# Patient Record
Sex: Female | Born: 1945 | Race: White | Hispanic: No | Smoking: Former smoker
Health system: Southern US, Community
[De-identification: ages and names within clinical notes are randomized; demographics above are authoritative.]

## PROBLEM LIST (undated history)

## (undated) DIAGNOSIS — Z8679 Personal history of other diseases of the circulatory system: Secondary | ICD-10-CM

## (undated) DIAGNOSIS — L97929 Non-pressure chronic ulcer of unspecified part of left lower leg with unspecified severity: Secondary | ICD-10-CM

## (undated) DIAGNOSIS — M1990 Unspecified osteoarthritis, unspecified site: Secondary | ICD-10-CM

## (undated) DIAGNOSIS — I440 Atrioventricular block, first degree: Secondary | ICD-10-CM

## (undated) DIAGNOSIS — Z9889 Other specified postprocedural states: Secondary | ICD-10-CM

## (undated) DIAGNOSIS — I739 Peripheral vascular disease, unspecified: Secondary | ICD-10-CM

## (undated) DIAGNOSIS — R112 Nausea with vomiting, unspecified: Secondary | ICD-10-CM

## (undated) DIAGNOSIS — M069 Rheumatoid arthritis, unspecified: Secondary | ICD-10-CM

## (undated) DIAGNOSIS — I872 Venous insufficiency (chronic) (peripheral): Secondary | ICD-10-CM

## (undated) DIAGNOSIS — J439 Emphysema, unspecified: Secondary | ICD-10-CM

## (undated) DIAGNOSIS — D519 Vitamin B12 deficiency anemia, unspecified: Secondary | ICD-10-CM

## (undated) DIAGNOSIS — Z86718 Personal history of other venous thrombosis and embolism: Secondary | ICD-10-CM

## (undated) DIAGNOSIS — K219 Gastro-esophageal reflux disease without esophagitis: Secondary | ICD-10-CM

## (undated) DIAGNOSIS — Z89619 Acquired absence of unspecified leg above knee: Secondary | ICD-10-CM

## (undated) DIAGNOSIS — I480 Paroxysmal atrial fibrillation: Secondary | ICD-10-CM

## (undated) DIAGNOSIS — Z8673 Personal history of transient ischemic attack (TIA), and cerebral infarction without residual deficits: Secondary | ICD-10-CM

## (undated) HISTORY — DX: Peripheral vascular disease, unspecified: I73.9

## (undated) HISTORY — PX: CARDIAC CATHETERIZATION: SHX172

## (undated) HISTORY — PX: VASCULAR SURGERY: SHX849

## (undated) HISTORY — PX: CATARACT EXTRACTION W/ INTRAOCULAR LENS  IMPLANT, BILATERAL: SHX1307

## (undated) HISTORY — PX: CARDIOVERSION: SHX1299

## (undated) HISTORY — DX: Venous insufficiency (chronic) (peripheral): I87.2

## (undated) HISTORY — PX: TOTAL KNEE ARTHROPLASTY: SHX125

---

## 1994-02-08 HISTORY — PX: CHOLECYSTECTOMY: SHX55

## 1997-11-23 ENCOUNTER — Emergency Department (HOSPITAL_COMMUNITY): Admission: EM | Admit: 1997-11-23 | Discharge: 1997-11-23 | Payer: Self-pay | Admitting: Emergency Medicine

## 1997-11-23 ENCOUNTER — Encounter: Payer: Self-pay | Admitting: Emergency Medicine

## 2003-02-21 ENCOUNTER — Encounter: Admission: RE | Admit: 2003-02-21 | Discharge: 2003-02-21 | Payer: Self-pay | Admitting: Urology

## 2003-02-26 ENCOUNTER — Ambulatory Visit (HOSPITAL_BASED_OUTPATIENT_CLINIC_OR_DEPARTMENT_OTHER): Admission: RE | Admit: 2003-02-26 | Discharge: 2003-02-26 | Payer: Self-pay | Admitting: Urology

## 2003-02-26 ENCOUNTER — Ambulatory Visit (HOSPITAL_COMMUNITY): Admission: RE | Admit: 2003-02-26 | Discharge: 2003-02-26 | Payer: Self-pay | Admitting: Urology

## 2003-02-26 ENCOUNTER — Encounter (INDEPENDENT_AMBULATORY_CARE_PROVIDER_SITE_OTHER): Payer: Self-pay | Admitting: Specialist

## 2003-02-26 HISTORY — PX: OTHER SURGICAL HISTORY: SHX169

## 2007-02-09 HISTORY — PX: HEMATOMA EVACUATION: SHX5118

## 2007-05-10 HISTORY — PX: CARDIAC ELECTROPHYSIOLOGY MAPPING AND ABLATION: SHX1292

## 2009-02-08 HISTORY — PX: TRIGGER FINGER RELEASE: SHX641

## 2010-02-04 ENCOUNTER — Inpatient Hospital Stay (HOSPITAL_COMMUNITY)
Admission: RE | Admit: 2010-02-04 | Discharge: 2010-02-08 | Payer: Self-pay | Source: Home / Self Care | Attending: Orthopedic Surgery | Admitting: Orthopedic Surgery

## 2010-02-04 HISTORY — PX: REVISION TOTAL KNEE ARTHROPLASTY: SUR1280

## 2010-04-20 LAB — CBC
HCT: 37.7 % (ref 36.0–46.0)
MCH: 39.4 pg — ABNORMAL HIGH (ref 26.0–34.0)
MCH: 39.5 pg — ABNORMAL HIGH (ref 26.0–34.0)
MCH: 39.9 pg — ABNORMAL HIGH (ref 26.0–34.0)
MCHC: 35.1 g/dL (ref 30.0–36.0)
MCV: 112.2 fL — ABNORMAL HIGH (ref 78.0–100.0)
Platelets: 168 10*3/uL (ref 150–400)
Platelets: 175 10*3/uL (ref 150–400)
Platelets: 220 10*3/uL (ref 150–400)
RBC: 2.46 MIL/uL — ABNORMAL LOW (ref 3.87–5.11)
RDW: 12.8 % (ref 11.5–15.5)
RDW: 12.9 % (ref 11.5–15.5)
WBC: 5.7 10*3/uL (ref 4.0–10.5)
WBC: 7.6 10*3/uL (ref 4.0–10.5)

## 2010-04-20 LAB — URINALYSIS, ROUTINE W REFLEX MICROSCOPIC
Bilirubin Urine: NEGATIVE
Glucose, UA: NEGATIVE mg/dL
Hgb urine dipstick: NEGATIVE
Nitrite: NEGATIVE
Protein, ur: NEGATIVE mg/dL
Specific Gravity, Urine: 1.011 (ref 1.005–1.030)
Urobilinogen, UA: 1 mg/dL (ref 0.0–1.0)
pH: 6.5 (ref 5.0–8.0)

## 2010-04-20 LAB — SURGICAL PCR SCREEN
MRSA, PCR: NEGATIVE
Staphylococcus aureus: NEGATIVE

## 2010-04-20 LAB — BASIC METABOLIC PANEL
BUN: 9 mg/dL (ref 6–23)
CO2: 26 mEq/L (ref 19–32)
CO2: 27 mEq/L (ref 19–32)
CO2: 28 mEq/L (ref 19–32)
Calcium: 8 mg/dL — ABNORMAL LOW (ref 8.4–10.5)
Calcium: 8.8 mg/dL (ref 8.4–10.5)
Calcium: 9 mg/dL (ref 8.4–10.5)
Chloride: 95 mEq/L — ABNORMAL LOW (ref 96–112)
Creatinine, Ser: 0.79 mg/dL (ref 0.4–1.2)
Creatinine, Ser: 0.82 mg/dL (ref 0.4–1.2)
Creatinine, Ser: 0.87 mg/dL (ref 0.4–1.2)
GFR calc Af Amer: >60 mL/min (ref >60)
GFR calc Af Amer: >60 mL/min (ref >60)
GFR calc non Af Amer: >60 mL/min (ref >60)
GFR calc non Af Amer: >60 mL/min (ref >60)
GFR calc non Af Amer: >60 mL/min (ref >60)
Glucose, Bld: 105 mg/dL — ABNORMAL HIGH (ref 70–99)
Glucose, Bld: 141 mg/dL — ABNORMAL HIGH (ref 70–99)
Potassium: 4.2 mEq/L (ref 3.5–5.1)
Sodium: 133 mEq/L — ABNORMAL LOW (ref 135–145)
Sodium: 135 mEq/L (ref 135–145)

## 2010-04-20 LAB — TYPE AND SCREEN
ABO/RH(D): A POS
Antibody Screen: NEGATIVE

## 2010-04-20 LAB — DIFFERENTIAL
Basophils Absolute: 0 10*3/uL (ref 0.0–0.1)
Eosinophils Relative: 0 % (ref 0–5)
Lymphs Abs: 0.7 10*3/uL (ref 0.7–4.0)
Monocytes Absolute: 0.6 10*3/uL (ref 0.1–1.0)
Monocytes Relative: 10 % (ref 3–12)
Neutro Abs: 4.4 10*3/uL (ref 1.7–7.7)

## 2010-04-20 LAB — PROTIME-INR: INR: 1.1 (ref 0.00–1.49)

## 2010-04-20 LAB — APTT: aPTT: 43 seconds — ABNORMAL HIGH (ref 24–37)

## 2010-04-21 ENCOUNTER — Other Ambulatory Visit: Payer: Self-pay | Admitting: Orthopedic Surgery

## 2010-04-21 ENCOUNTER — Encounter (HOSPITAL_COMMUNITY): Payer: Medicare Other

## 2010-04-21 LAB — DIFFERENTIAL
Basophils Relative: 0 % (ref 0–1)
Eosinophils Absolute: 0 10*3/uL (ref 0.0–0.7)
Monocytes Absolute: 0.6 10*3/uL (ref 0.1–1.0)
Neutro Abs: 2.8 10*3/uL (ref 1.7–7.7)

## 2010-04-21 LAB — URINALYSIS, ROUTINE W REFLEX MICROSCOPIC
Ketones, ur: >80 mg/dL — AB
Nitrite: NEGATIVE
pH: 6 (ref 5.0–8.0)

## 2010-04-21 LAB — CBC
MCH: 39.2 pg — ABNORMAL HIGH (ref 26.0–34.0)
MCV: 110.8 fL — ABNORMAL HIGH (ref 78.0–100.0)
Platelets: 325 10*3/uL (ref 150–400)
RDW: 13.8 % (ref 11.5–15.5)
WBC: 4 10*3/uL (ref 4.0–10.5)

## 2010-04-21 LAB — BASIC METABOLIC PANEL
BUN: 4 mg/dL — ABNORMAL LOW (ref 6–23)
CO2: 29 mEq/L (ref 19–32)
Chloride: 95 mEq/L — ABNORMAL LOW (ref 96–112)
Creatinine, Ser: 0.68 mg/dL (ref 0.4–1.2)

## 2010-04-21 LAB — PROTIME-INR: Prothrombin Time: 12.5 seconds (ref 11.6–15.2)

## 2010-04-21 LAB — APTT: aPTT: 33 seconds (ref 24–37)

## 2010-04-23 ENCOUNTER — Inpatient Hospital Stay (HOSPITAL_COMMUNITY)
Admission: RE | Admit: 2010-04-23 | Discharge: 2010-04-27 | DRG: 465 | Disposition: A | Discharge status: Skilled Nursing Facility | Payer: Medicare Other | Source: Ambulatory Visit | Attending: Orthopedic Surgery | Admitting: Orthopedic Surgery

## 2010-04-23 DIAGNOSIS — T8450XA Infection and inflammatory reaction due to unspecified internal joint prosthesis, initial encounter: Principal | ICD-10-CM | POA: Diagnosis present

## 2010-04-23 DIAGNOSIS — J449 Chronic obstructive pulmonary disease, unspecified: Secondary | ICD-10-CM | POA: Diagnosis present

## 2010-04-23 DIAGNOSIS — B957 Other staphylococcus as the cause of diseases classified elsewhere: Secondary | ICD-10-CM | POA: Diagnosis present

## 2010-04-23 DIAGNOSIS — Y831 Surgical operation with implant of artificial internal device as the cause of abnormal reaction of the patient, or of later complication, without mention of misadventure at the time of the procedure: Secondary | ICD-10-CM | POA: Diagnosis present

## 2010-04-23 DIAGNOSIS — K219 Gastro-esophageal reflux disease without esophagitis: Secondary | ICD-10-CM | POA: Diagnosis present

## 2010-04-23 DIAGNOSIS — J4489 Other specified chronic obstructive pulmonary disease: Secondary | ICD-10-CM | POA: Diagnosis present

## 2010-04-23 DIAGNOSIS — Z01812 Encounter for preprocedural laboratory examination: Secondary | ICD-10-CM

## 2010-04-23 DIAGNOSIS — Z01818 Encounter for other preprocedural examination: Secondary | ICD-10-CM

## 2010-04-23 DIAGNOSIS — I4891 Unspecified atrial fibrillation: Secondary | ICD-10-CM | POA: Diagnosis present

## 2010-04-23 DIAGNOSIS — Z880 Allergy status to penicillin: Secondary | ICD-10-CM

## 2010-04-23 DIAGNOSIS — M069 Rheumatoid arthritis, unspecified: Secondary | ICD-10-CM | POA: Diagnosis present

## 2010-04-23 DIAGNOSIS — Z96659 Presence of unspecified artificial knee joint: Secondary | ICD-10-CM

## 2010-04-23 HISTORY — PX: OTHER SURGICAL HISTORY: SHX169

## 2010-04-24 ENCOUNTER — Inpatient Hospital Stay (HOSPITAL_COMMUNITY): Payer: Medicare Other

## 2010-04-24 DIAGNOSIS — M009 Pyogenic arthritis, unspecified: Secondary | ICD-10-CM

## 2010-04-24 LAB — CBC
MCH: 38.3 pg — ABNORMAL HIGH (ref 26.0–34.0)
MCV: 110.7 fL — ABNORMAL HIGH (ref 78.0–100.0)
Platelets: 231 10*3/uL (ref 150–400)
RDW: 13.5 % (ref 11.5–15.5)

## 2010-04-24 LAB — BASIC METABOLIC PANEL
BUN: 3 mg/dL — ABNORMAL LOW (ref 6–23)
Creatinine, Ser: 0.59 mg/dL (ref 0.4–1.2)
GFR calc non Af Amer: >60 mL/min (ref >60)

## 2010-04-24 NOTE — Op Note (Signed)
[name], [name]          ACCOUNT NO.:  [id number]  MEDICAL RECORD NO.:  [id number]           PATIENT TYPE:  I  LOCATION:  0005                         FACILITY:  [facility]  PHYSICIAN:  Madlyn Frankel. Charlann Boxer, M.D.  DATE OF BIRTH:  11/20/1945  DATE OF PROCEDURE:  04/23/2010 DATE OF DISCHARGE:                              OPERATIVE REPORT   PREOPERATIVE DIAGNOSIS:  Infected right total knee replacement.  POSTOPERATIVE DIAGNOSIS:  Infected right total knee replacement.  FINDINGS:  There was obvious purulence in the knee supporting preoperative findings.  PROCEDURE:  Resection of right total knee replacement with I and D, removal of cement as well as placement of antibiotic spacer.  SURGEON:  Madlyn Frankel. Charlann Boxer, M.D.  ASSISTANT:  Surgical team.  ANESTHESIA:  Preoperative regional femoral nerve block plus a general anesthetic.  SPECIMEN:  I did send joint fluid though preoperative workup had been done, findings as above.  BLOOD LOSS:  Minimal.  TOURNIQUET TIME:  75 minutes at 250 mmHg.  DRAINS:  One Hemovac.  COMPLICATIONS:  None.  INDICATIONS FOR PROCEDURE:  [name] is a 65 year old patient of mine status post revision right total knee approximately 3 to 4 months ago.  She had been doing very well on the postoperative period without any wound complications, however, she presented to the office with a swollen, red and warm knee.  Aspiration in the office revealed purulence and was confirmed by bacterial analysis.  She was set up for resection arthroplasty as providing the best option in managing her infection. Risks of recurrent infection, DVT, component failure as well as the postoperative course and planned revision and reimplantation surgery were all discussed and reviewed.  Consent was obtained for above.  PROCEDURE IN DETAIL:  The patient was brought to operative theater. Once adequate anesthesia, preoperative antibiotics and 1 g of Ancef administered based  on the preoperative workup, the patient was positioned supine with the right leg and a thigh tourniquet.  The right lower extremity was then prepped and draped in a sterile fashion with the right leg placed in [facility] leg holder.  Time-out was performed identifying the patient, planned procedure and extremity.  Leg was exsanguinated, tourniquet elevated to 250 mmHg.  The patient's old incision was utilized and a portion of it excised at widen scar. Soft tissue plane was created and median arthrotomy was made.  The patient has a history of patellectomy in the past.  Following initial exposure and synovectomy, the medial and lateral gutters as well as suprapatellar area, retractors were placed and I used a thin ACL saw blade for which I was able to undermine this tibial component as well as the medial and lateral aspects of the femur.  Following this, the tibia was subluxated anteriorly and I was able to remove the tibial component minus the cement.  At this point using osteotomes, I removed the proximal area of cement, then using [place] cement removal set, I was able to remove the remaining distal portion of cement plug and cement restrictor without complication and in toto.  The femoral component was then attended to and was able to be removed and after undermining the  cement, bone interfaced at the distal aspect, I was able to free it up and using the impaction device, I was able to extract it using a slap device.  Following this, when I removed this, the entire distal cement then was removed in toto without complication.  I then removed the cement restrictor distally.  At this point with the components removed, I used a canal irrigator, brush irrigator and irrigated the femoral and tibial canals.  Following this irrigation, the knee was reirrigated at this point with about 5 L of normal saline solution and then the cement was mixed on back table.  Three batches of cement were  mixed with 3 g of vancomycin 3.6 g of tobramycin.  Once the cement began to cure and harden, it was placed into the knee with a plug placed in the distal femur, one in the proximal tibia and then the cement spacer placed between the tibial femoral joint and small shim placed into the suprapatellar pouch.  We irrigated the knee out again with the remaining liter of normal saline solution.  I placed a medium Hemovac drain deep.  At this point, the extensor mechanism was closed with the knee in extension using #1 PDS suture.  The remaining wound was closed with 2-0 Vicryl and staples on the skin.  The skin was cleaned, dried, dressed sterilely using Xeroform and bulky sterile wrap.  The tourniquet had been let down after 75 minutes without significant complication.  The patient was brought to the recovery room, extubated, in stable condition tolerating the procedure well.  Infectious Disease consult made.  She was at least placed on vancomycin now and culture will be assessed by Infectious Disease and appropriate antibiotics, treated for 6 weeks followed by planned reimplantation in 2 to 2-1/2 months.     Madlyn Frankel Charlann Boxer, M.D.     MDO/MEDQ  D:  04/23/2010  T:  04/23/2010  Job:  [id number]  Electronically Signed by Durene Romans M.D. on 04/24/2010 03:06:13 PM

## 2010-04-24 NOTE — H&P (Signed)
NAMEIMA, [name]          ACCOUNT NO.:  [id number]  MEDICAL RECORD NO.:  [id number]          PATIENT TYPE:  LOCATION:                                 FACILITY:  PHYSICIAN:  Madlyn Frankel. Charlann Boxer, M.D.  DATE OF BIRTH:  August 29, 1945  DATE OF ADMISSION: DATE OF DISCHARGE:                             HISTORY & PHYSICAL   CHIEF COMPLAINT:  Status post total knee arthroplasty of right knee with probable infection.  HISTORY OF PRESENT ILLNESS:  This is a 65 year old lady with a history of total knee arthroplasty in December 2011, who has subsequently developed probable infection, who is now scheduled for I and D and probable removal of antibiotic-impregnated spacer implant.  In the office today, her knee shows some increased redness and swelling.  I had her evaluated by Dr. Lequita Halt with myself and we aspirated her knee of 40 mL of cloudy red-tinged fluid.  She is given the signs and symptoms of increasing infection, told to contact us immediately before Thursday should these arise, otherwise we will proceed with surgery on Thursday.  PAST MEDICAL HISTORY:  Drug allergy to PENICILLIN with hives, SULFA with anaphylactic shock reaction, and AVELOX with nausea, vomiting, and diarrhea.  Current medications: 1. Amiodarone 200 mg 1/2 tablet b.i.d. 2. Dulera 100/5 mcg inhalation 2 puffs twice a day. 3. Edecrin 25 mg 4 tablets daily. 4. Gabapentin 300 mg 1 capsule at bedtime. 5. Pradaxa 150 mg 1 b.i.d. 6. Prevacid 30 mg 1 daily. 7. Singulair 10 mg 1 nightly. 8. Spirolactone 25 mg 1 b.i.d. 9. Ventolin 90 mcg inhaler 2 puffs q.i.d. p.r.n.  Medical illnesses include: 1. Hypertension. 2. Asthma. 3. Atrial fibrillation with subsequent medical and electrophysiologic     treatment. 4. Reflux. 5. Chronic bronchitis. 6. Rheumatoid arthritis. 7. B12 deficiency.  Previous surgeries include bilateral total knee arthroplasty in the past with revision total knee arthroplasty of the right  knee in December with subsequent infection.  Right and left cataracts and cardioversion in November 2011.  FAMILY HISTORY:  Positive for meningitis and congestive heart failure.  SOCIAL HISTORY:  The patient is widowed.  She is disabled.  She lives alone.  She does not smoke and does not drink.  REVIEW OF SYSTEMS:  CENTRAL NERVOUS SYSTEM:  Positive for cataract surgery.  PULMONARY:  Positive for occasional exertional shortness of breath.  CARDIOVASCULAR:  Positive for irregular heartbeat.  Negative for chest pain or palpitations.  History of atrial fibrillation with cardioversion.  GI:  Negative for ulcers or hepatitis.  Positive for reflux.  GU:  Negative for urinary tract difficulty.  MUSCULOSKELETAL: Positive as in HPI, plus degenerative disk disease and scoliosis.  PHYSICAL EXAMINATION:  VITAL SIGNS:  BP 120/70, respirations 16, and pulse 60 and regular. GENERAL APPEARANCE:  A well-developed, well-nourished lady in no acute distress. HEENT:  Head is normocephalic.  Nose is patent.  Nares are patent. Status post bilateral cataracts.  Throat is without injection. NECK:  Supple without adenopathy.  Carotids are 2+ without bruit. CHEST:  Clear to auscultation.  No rales or rhonchi.  Respirations 16. HEART:  Regular rate and rhythm at 608 beats per minute without murmur. ABDOMEN:  Soft with active bowel sounds.  No masses or organomegaly. NEUROLOGIC:  The patient is alert and oriented to time, place, and person.  Cranial nerves II-XII are grossly intact. EXTREMITIES:  The right knee with effusion and mild redness and warmth. She is aspirated 40 mL of cloudy red-tinged fluid.  She has 3-120 degrees range of motion.  Neurovascular status intact.  She is afebrile.  IMPRESSION:  Right knee question infected revision knee.  PLAN:  I and D with probable removal and antibiotic spacer implantation of right knee.     [name]. Chabon, P.A.   ______________________________ Madlyn Frankel Charlann Boxer, M.D.    SJC/MEDQ  D:  04/21/2010  T:  04/22/2010  Job:  [id number]  Electronically Signed by Jodene Nam P.A. on 04/23/2010 07:55:58 PM Electronically Signed by Durene Romans M.D. on 04/24/2010 03:06:08 PM

## 2010-04-25 LAB — CBC
MCH: 38.4 pg — ABNORMAL HIGH (ref 26.0–34.0)
MCHC: 34.5 g/dL (ref 30.0–36.0)
MCV: 111.4 fL — ABNORMAL HIGH (ref 78.0–100.0)
Platelets: 208 10*3/uL (ref 150–400)
RDW: 13.9 % (ref 11.5–15.5)
WBC: 7.4 10*3/uL (ref 4.0–10.5)

## 2010-04-25 LAB — DIFFERENTIAL
Basophils Relative: 0 % (ref 0–1)
Eosinophils Absolute: 0 10*3/uL (ref 0.0–0.7)
Lymphs Abs: 1 10*3/uL (ref 0.7–4.0)
Monocytes Absolute: 0.7 10*3/uL (ref 0.1–1.0)
Neutrophils Relative %: 77 % (ref 43–77)

## 2010-04-25 LAB — BASIC METABOLIC PANEL
BUN: 3 mg/dL — ABNORMAL LOW (ref 6–23)
Calcium: 8.1 mg/dL — ABNORMAL LOW (ref 8.4–10.5)
Creatinine, Ser: 0.68 mg/dL (ref 0.4–1.2)
GFR calc non Af Amer: >60 mL/min (ref >60)
Potassium: 3.7 mEq/L (ref 3.5–5.1)

## 2010-04-25 LAB — SEDIMENTATION RATE: Sed Rate: 25 mm/hr — ABNORMAL HIGH (ref 0–22)

## 2010-04-26 LAB — BODY FLUID CULTURE

## 2010-04-26 LAB — HEMOGLOBIN AND HEMATOCRIT, BLOOD: HCT: 28.7 % — ABNORMAL LOW (ref 36.0–46.0)

## 2010-04-26 LAB — TYPE AND SCREEN
Antibody Screen: NEGATIVE
Unit division: 0

## 2010-04-27 NOTE — Discharge Summary (Signed)
NAMEFRANCELIA, [name]          ACCOUNT NO.:  [id number]  MEDICAL RECORD NO.:  [id number]           PATIENT TYPE:  I  LOCATION:  1617                         FACILITY:  [facility]  PHYSICIAN:  Madlyn Frankel. Charlann Boxer, M.D.  DATE OF BIRTH:  01-01-1946  DATE OF ADMISSION:  04/23/2010 DATE OF DISCHARGE:  04/27/2010                        DISCHARGE SUMMARY - REFERRING   ADMITTING DIAGNOSIS:  Infected right total knee replacement.  DISCHARGE DIAGNOSES: 1. 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 2. Hypertension. 3. Asthma. 4. Atrial fibrillation. 5. History of reflux disease. 6. History of chronic bronchitis. 7. History of rheumatoid arthritis. 8. B12 deficiency.  ADMITTING HISTORY:  [name] is a very pleasant 65 year old patient of mine with a history of revision right total knee replacement done in December of 2011.  She had been doing extremely well until she was seen in the office about 1 week prior to admission with increased swelling and pain.  At that time, she was noted to have warmth in her knee and aspiration was carried out indicating elevated white blood cell count with synovial fluid as well as a positive Gram stain.  She was scheduled for resection of her arthroplasty after reviewing the risks and benefits.  HOSPITAL COURSE:  The patient admitted for same-day surgery on April 23, 2010, for a resection of right total knee replacement and placement of antibiotic spacer per our previous discussions.  Surgery was uncomplicated.  She went to the recovery room where she stayed for routine time and then remained on the orthopedic ward.  Given the fact that surgery was done on Thursday and is planned for skilled nursing facility for management of IV antibiotics further, she was going to be in the hospital over the weekend.  Her hospital course was unremarkable without complication.  She was seen and evaluated in  consultation by Infectious Disease doctor, Dr. Daiva Eves, with [facility]. Plan and recommendations for her to be placed on 2 g of Ancef IV every 8 hours.  She received a PICC line on postoperative day #1 with antibiotics administrated.  She was otherwise placed on a regular diet, tolerating well.  She was seen and evaluated by Physical Therapy being partial weightbearing.  By postop day #4, she was ready for discharge to a nursing facility.  DISCHARGE INSTRUCTIONS:  She is to have daily dressing changes, keeping her wounds as dry as possible.  She will be seen by Physical Therapy and worked on upper body strengthening as well as partial weightbearing 50% on the right lower extremity.  She will have a PICC line in place for IV antibiotics, will be down for a total of 6 weeks' duration.  Her wound needs to be dressed daily for dry dressings.  If there is further wound complications, the nursing facility can let us know at [phone].  DISCHARGE FOLLOWUP:  She is to see Dr. Durene [name] at [facility] at [phone] in 2 weeks' period of time for staple removal.  DISCHARGE MEDICATIONS:  She will be placed on: 1. Ancef 2 g every 8 hours for 6 weeks IV. 2. Colace  100 mg p.o. b.i.d. as needed for constipation while on pain     medicine. 3. Iron 325 mg b.i.d. for 20 days. 4. Norco 7.5/325 one to two tablets every 4-6 hours as needed for     pain. 5. Amiodarone 200 mg p.o. b.i.d. 6. Celebrex 200 mg at bedtime as needed. 7. Vitamin B12 injections 1000 mcg weekly. 8. Dulera 100/5 mcg 2 puffs twice daily. 9. Edecrin 25 mg 2 tablets b.i.d. 10.Robaxin 500 mg p.o. q.6 h. as needed for pain. 11.Gabapentin 300 mg at bedtime. 12.Pradaxa 150 mg b.i.d. 13.Prilosec 20 mg at bedtime. 14.Nasal spray as needed. 15.Singulair 1 tablet at bedtime. 16.Spironolactone 50 mg 2 tablets b.i.d. 17.Systane 2 drops right eye daily. 18.Ventolin inhaler 1-2 puffs every 6 hours as  needed.  Discharge medicines were prescribed.  If there are any orthopedics concerns, they can be addressed through [facility] at [phone], any medical issues, through her primary physician.     Madlyn Frankel Charlann Boxer, M.D.     MDO/MEDQ  D:  04/27/2010  T:  04/27/2010  Job:  [id number]  Electronically Signed by [name] M.D. on 04/27/2010 02:27:20 PM

## 2010-04-28 LAB — ANAEROBIC CULTURE

## 2010-04-30 NOTE — Discharge Summary (Signed)
[name], [name]          ACCOUNT NO.:  [id number]  MEDICAL RECORD NO.:  [id number]          PATIENT TYPE:  INP  LOCATION:  1618                         FACILITY:  [facility]  PHYSICIAN:  Madlyn Frankel. Charlann Boxer, M.D.  DATE OF BIRTH:  December 20, 1945  DATE OF ADMISSION:  02/04/2010 DATE OF DISCHARGE:  02/08/2010                              DISCHARGE SUMMARY   ADMITTING DIAGNOSIS:  Failed right total knee replacement.  DISCHARGE DIAGNOSES: 1. Failed right total knee replacement. 2. Impaired vision. 3. History of asthma, bronchitis and pneumonia. 4. History of heart failure with arrhythmia and atrial fibrillation. 5. History of cholecystitis and reflux disease.  BRIEF ADMITTING HISTORY:  Ms. Navedo is a pleasant 65 year old female scheduled for admission for same-day surgery on February 04, 2010.  She had a right total knee replacement, was performed 15 years ago with radiographic and clinical workup concerning for loosening of aseptic nature to the right knee.  After reviewing with her, her current situation and her workup in the office, there was no concern for clinical infection.  She was prepared for right knee surgery.  Actually, is requiring a knee brace with pain with all activity.  Questions were encouraged and reviewed at the time of her admission.  HOSPITAL COURSE:  The patient was admitted for same-day surgery on February 04, 2010 where she underwent a revision of right total knee replacement.  This was an uncomplicated procedure with no clinical concern for infection at that time.  She is transferred to the recovery room where she remained for routine time prior to being transferred to the floor.  Postop day #1, she was noted to hematocrit of 26.8.  Her sodium was low at 126 with stable electrolytes otherwise.  She was seen and evaluated by Physical Therapy with plans to go to nursing facility in the near future.  Her Hemovac and Foley removed.  She had a  regular diet.  On postop day #2, she required no blood; however, she remained in the hospital until nursing facilities could be arranged.  On postop day #4, by February 08, 2010, she was seen and evaluated by therapy and arrangements were made for home health physical therapy to nursing facility.  She decided that instead of going to nursing facility to go home if she could make arrangements.  DISCHARGE INSTRUCTIONS:  She will keep her wound dry for 2 weeks.  She was discharged from the hospital by one of my partners.  These instructions were provided.  She will be weightbearing as tolerated, work with range of motion and strengthening.  She will follow up with me in 2 weeks for wound check.  DISCHARGE MEDICATIONS:  At this point would include: 1. Norco 7.5 one to two tablets every 4-6 hours as needed for pain. 2. Robaxin 500 mg p.o. q.6 hours for muscle spasm pain. 3. Celebrex as needed for discomfort. 4. Dulera 100/5 mcg 2 puffs twice a day. 5. Edecrin 2 tablets p.o. b.i.d. 6. Pradaxa 75 mg b.i.d. 7. Prilosec 20 mg daily. 8. Singulair 10 mg at bedtime 9. Spironolactone 50 mg b.i.d.  Questions encouraged and reviewed, and to follow up in 2 weeks.  Madlyn Frankel Charlann Boxer, M.D.     MDO/MEDQ  D:  04/29/2010  T:  04/30/2010  Job:  [id number]  Electronically Signed by Durene Romans M.D. on 04/30/2010 01:30:53 PM

## 2010-05-06 NOTE — Consult Note (Signed)
[name], [name]          ACCOUNT NO.:  [id number]  MEDICAL RECORD NO.:  [id number]           PATIENT TYPE:  I  LOCATION:  1617                         [facility]AC[facility]  PHYSICIAN:  Acey Lav, MD  DATE OF BIRTH:  March 10, 1945  DATE OF CONSULTATION: DATE OF DISCHARGE:                                CONSULTATION   REQUESTING PHYSICIAN:  Madlyn Frankel. Charlann Boxer, MD  REASON FOR INFECTIOUS DISEASE CONSULTATION:  The patient with infected prosthetic knee.  HISTORY OF PRESENT ILLNESS:  [name] is a 65 year old Caucasian lady with past medical history significant for atrial fibrillation, also with bilateral knee replacements, who underwent surgery in December with failure and loosening of her right total knee replacement.  This was revised and replaced on February 04, 2010.  Of note, she states there was some redness around the knee at that time and she wonder whether this infection might have been already growing in her original knee replacement 16 years ago.  In any case, the patient developed erythema and swelling with a large effusion about her knee, was seen by Dr. Charlann Boxer in his office on the 7th of March and he aspirated the knee for cell count differential and culture.  Cultures ultimately grew a coagulase- negative Staphylococcus species which was sensitive to oxacillin, penicillin, cefazolin, vancomycin, tetracycline, Bactrim.  The patient had a repeat aspiration and was ultimately brought to the hospital for surgery on the 15th.  On the 15th, she underwent resection of her total knee arthroplasty with I and D and removal of cement as well as placement of antibiotic spacer.  Cultures were also obtained intraoperatively and she was placed on vancomycin.  When we were able to obtain the culture data which showed that she had an oxacillin sensitive coagulase-negative staph species, we changed her cefazolin.  Of note, the patient has had allergies to penicillin, which  cause hives.  She has tolerated several doses of Ancef during her surgery in February.  The patient feels relatively well today.  She does not feel up to going home and would prefer to go to a skilled nursing facility which has been arranged.  PAST MEDICAL HISTORY: 1. Atrial fibrillation, status post multiple ablations and     cardioversions, currently on amiodarone, also on Pradaxa. 2. History of bilateral knee replacements. 3. Neuropathy. 4. Acid reflux. 5. Hypertension. 6. Seasonal allergies with asthma.  ALLERGIES: 1. Penicillin, which cause hives. 2. Sulfa which cause anaphylaxis. 3. Moxifloxacin which had caused GI upset.  CURRENT MEDICATIONS: 1. Cefazolin 2 g IV q.8h. 2. Amiodarone. 3. Pradaxa. 4. Docusate. 5. Ferrous sulfate. 6. Fluticasone. 7. Percocet. 8. Pantoprazole. 9. Salmetrol. 10.Aldactone. 11.Tylenol. 12.Dulcolax. 13.Dilaudid. 14.Zofran. 15.Ambien.  REVIEW OF HISTORY:  As per history of present illness, otherwise 12 point review of systems is negative.  PHYSICAL EXAMINATION:  VITAL SIGNS:  Temperature maximum 97.9, temperature current 97.9; blood pressure 112/73; pulse 70; respiration 16; pulse ox 90% on 2 L. GENERAL:  Quite pleasant lady in no acute stress. HEENT:  Normocephalic, atraumatic.  Pupils reactive to light.  Sclerae anicteric.  Oropharynx clear. NECK:  Supple. CARDIOVASCULAR:  Regular rate and rhythm, bradycardic.  No  murmurs, gallops, or rubs heard. LUNGS:  Clear to auscultation without wheezes, rhonchi, or rales. ABDOMEN:  Soft, nondistended. EXTREMITIES:  Right leg is wrapped in a support apparatus.  Left knee with total knee arthroplasty site which is well healed sometimes a years ago. NEUROLOGIC:  Nonfocal.  LABORATORY DATA:  Reviewed but currently I cannot dictate them because the computer is down.  In any case, her remarkable biological data showed culture from the [facility] that has not grown any organisms on aerobic or  anaerobic cultures today.  Aspirate on the knee on the 7th did grow coagulase negative staphylococcal species that was sensitive to oxacillin, penicillin, doxycycline, Bactrim, vancomycin.  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 1. 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  At that point in time, it will be safe to reimplant a     new prosthetic     knee.  I will check a sed rate and C-reactive protein as well to     check this at baseline.  I will follow up the cultures.  Thank you for this Infectious Disease consultation.     Acey Lav, MD     CV/MEDQ  D:  04/24/2010  T:  04/24/2010  Job:  [id number]  cc:   Dr. Francesco Runner _____ Family Medicine  Dr. Lupita Leash  Dr. [facility]  Electronically Signed by Paulette Blanch DAM MD on 05/06/2010 12:29:54 PM

## 2010-06-22 NOTE — H&P (Signed)
[name], [name]          ACCOUNT NO.:  [id number]  MEDICAL RECORD NO.:  [id number]           PATIENT TYPE:  LOCATION:                                 FACILITY:  PHYSICIAN:  Madlyn Frankel. Charlann Boxer, M.D.  DATE OF BIRTH:  November 23, 1945  DATE OF ADMISSION: DATE OF DISCHARGE:                             HISTORY & PHYSICAL   DATE OF SURGERY:  Jun 23, 2010  ADMITTING DIAGNOSIS:  Status post infected total knee arthroplasty, right knee with removal of implant and placement of antibiotic spacer.  HISTORY OF PRESENT ILLNESS:  This is a 64-year lady with a history of a total knee arthroplasty with subsequent infection, removal of the implant with antibiotic spacer.  She has had her IV antibiotics, and the right knee down shows no clinical evidence of infection.  She is scheduled for removal of antibiotic spacer and reimplantation of total knee arthroplasty.  Today, however, in the office she is noted to have cellulitis of her lower leg.  She bumped her leg in the elevator 3 days ago and started having redness and swelling.  She has no fevers, no constitutional symptoms, no chills.  She does have redness and swelling about a third of the way up her tibia.  No drainage.  No streaking.  No compartment syndrome.  Neurovascular status is intact.  X-ray showed no bony abnormalities.  I did review the patient with Dr. Thomasena Edis.  He looked at her.  We will put her on Keflex 500 q.i.d., and see her back on Monday in the office to make sure that this is healing and then proceed with surgery if it.  In the interim if she gets increased redness, swelling, warmth, fevers, chills, constitutional symptoms, or streaking up the leg, she will contact sooner.  PAST MEDICAL HISTORY:  Drug allergies to PENICILLIN with hives, SULFA with anaphylactic shock, and AVELOX with nausea, vomiting, and diarrhea.  CURRENT MEDICATIONS: 1. Amiodarone 200 mg 1/2 tablet b.i.d. 2. Dulera 100/5 mcg inhalation 2 puffs twice  a day. 3. Edecrin 25 mg 4 tablets daily. 4. Gabapentin 300 mg 1 daily. 5. Pradaxa 150 mg 1 b.i.d. 6. Prevacid 30 mg 1 daily. 7. Singulair 10 mg nightly. 8. Spirolactone 25 mg 1 b.i.d. 9. Ventolin 90 mcg inhaler 2 puffs q.i.d.  MEDICAL ILLNESSES:  Hypertension, asthma, atrial fibrillation, reflux, chronic bronchitis, rheumatoid arthritis, and B12 deficiency.  PREVIOUS SURGERIES:  Bilateral total knee arthroplasty with revision, right total knee arthroplasty, and subsequent infection.  In December, right and left cataracts and cardioversion.  FAMILY HISTORY:  Positive for meningitis and congestive heart failure.  SOCIAL HISTORY:  The patient is widowed.  She is disabled.  She lives alone.  She does not smoke and does not drink.  REVIEW OF SYSTEMS:  CENTRAL NERVOUS SYSTEM:  Negative for headache, blurred vision, or dizziness.  PULMONARY:  Positive for occasional exertional shortness of breath and asthma.  CARDIOVASCULAR:  Positive for irregular heartbeat.  Negative for chest pain or palpitations. History of atrial fibrillation with previous cardioversion.  GI: Negative for ulcers, hepatitis.  Positive for reflux.  GU: Negative for urinary tract difficulty.  MUSCULOSKELETAL:  Positive as in  HPI.  PHYSICAL EXAMINATION:  VITAL SIGNS:  BP 118/62, respirations 16, pulse 72 and regular. GENERAL APPEARANCE:  Well-developed, well-nourished lady in no acute distress. HEENT:  Head normocephalic.  Nose patent.  Ears patent.  Pupils equal, round, and reactive to light.  Throat without injection. NECK:  Supple without adenopathy.  Carotids 2+ without bruit. CHEST:  Clear to auscultation.  No rales or rhonchi. HEART:  Regular rate and rhythm at 72 beats per minute without murmur. ABDOMEN:  Soft.  Active bowel sounds.  No mass, organomegaly. NEUROLOGIC:  The patient alert and oriented to time, place, and person. Cranial nerves II through XII grossly intact. EXTREMITIES:  The right total knee  removed with antibiotic spacer in place.  No redness, swelling, or warmth.  No calf tenderness.  No cords. Negative Homans sign.  Neurovascular status intact.  The left lower leg shows the signs of cellulitis that we discussed, and she is placed on Keflex 500 q.i.d.  No cords.  No streaking.  No signs of compartment syndrome.  IMPRESSION:  Left knee status post total knee infected implant with removal of antibiotic spacer.  PLAN:  Reimplantation, right total knee arthroplasty.  Note, the patient will not receive tranexamic acid.     [name], P.A.   ______________________________ Madlyn Frankel Charlann Boxer, M.D.    SJC/MEDQ  D:  06/17/2010  T:  06/18/2010  Job:  [id number]  Electronically Signed by Jodene Nam P.A. on 06/22/2010 12:01:06 PM Electronically Signed by Durene Romans M.D. on 06/22/2010 03:46:50 PM

## 2010-06-23 ENCOUNTER — Inpatient Hospital Stay (HOSPITAL_COMMUNITY)
Admission: RE | Admit: 2010-06-23 | Discharge: 2010-07-01 | DRG: 488 | Disposition: A | Discharge status: Home Health | Payer: Medicare Other | Source: Ambulatory Visit | Attending: Orthopedic Surgery | Admitting: Orthopedic Surgery

## 2010-06-23 DIAGNOSIS — K0530 Chronic periodontitis, unspecified: Secondary | ICD-10-CM | POA: Diagnosis present

## 2010-06-23 DIAGNOSIS — M21869 Other specified acquired deformities of unspecified lower leg: Principal | ICD-10-CM | POA: Diagnosis present

## 2010-06-23 DIAGNOSIS — K219 Gastro-esophageal reflux disease without esophagitis: Secondary | ICD-10-CM | POA: Diagnosis present

## 2010-06-23 DIAGNOSIS — L02419 Cutaneous abscess of limb, unspecified: Secondary | ICD-10-CM | POA: Diagnosis present

## 2010-06-23 DIAGNOSIS — K029 Dental caries, unspecified: Secondary | ICD-10-CM | POA: Diagnosis present

## 2010-06-23 DIAGNOSIS — Z96659 Presence of unspecified artificial knee joint: Secondary | ICD-10-CM

## 2010-06-23 DIAGNOSIS — I10 Essential (primary) hypertension: Secondary | ICD-10-CM | POA: Diagnosis present

## 2010-06-23 DIAGNOSIS — M278 Other specified diseases of jaws: Secondary | ICD-10-CM | POA: Diagnosis present

## 2010-06-23 DIAGNOSIS — I4891 Unspecified atrial fibrillation: Secondary | ICD-10-CM | POA: Diagnosis present

## 2010-06-23 DIAGNOSIS — K083 Retained dental root: Secondary | ICD-10-CM | POA: Diagnosis present

## 2010-06-23 DIAGNOSIS — Z880 Allergy status to penicillin: Secondary | ICD-10-CM

## 2010-06-23 DIAGNOSIS — M069 Rheumatoid arthritis, unspecified: Secondary | ICD-10-CM | POA: Diagnosis present

## 2010-06-23 LAB — DIFFERENTIAL
Basophils Absolute: 0 10*3/uL (ref 0.0–0.1)
Basophils Relative: 0 % (ref 0–1)
Lymphocytes Relative: 14 % (ref 12–46)
Monocytes Relative: 11 % (ref 3–12)
Neutro Abs: 3.8 10*3/uL (ref 1.7–7.7)
Neutrophils Relative %: 75 % (ref 43–77)

## 2010-06-23 LAB — URINALYSIS, ROUTINE W REFLEX MICROSCOPIC
Bilirubin Urine: NEGATIVE
Hgb urine dipstick: NEGATIVE
Ketones, ur: 15 mg/dL — AB
Nitrite: NEGATIVE
Protein, ur: NEGATIVE mg/dL
Specific Gravity, Urine: 1.016 (ref 1.005–1.030)
Urobilinogen, UA: 0.2 mg/dL (ref 0.0–1.0)

## 2010-06-23 LAB — BASIC METABOLIC PANEL
BUN: 3 mg/dL — ABNORMAL LOW (ref 6–23)
Calcium: 8.9 mg/dL (ref 8.4–10.5)
Creatinine, Ser: 0.59 mg/dL (ref 0.4–1.2)
GFR calc non Af Amer: >60 mL/min (ref >60)
Glucose, Bld: 89 mg/dL (ref 70–99)

## 2010-06-23 LAB — CBC
HCT: 36.9 % (ref 36.0–46.0)
Hemoglobin: 13 g/dL (ref 12.0–15.0)
RBC: 3.45 MIL/uL — ABNORMAL LOW (ref 3.87–5.11)

## 2010-06-23 LAB — TYPE AND SCREEN
ABO/RH(D): A POS
Antibody Screen: NEGATIVE

## 2010-06-23 LAB — PROTIME-INR
INR: 0.89 (ref 0.00–1.49)
Prothrombin Time: 12.2 seconds (ref 11.6–15.2)

## 2010-06-23 LAB — APTT: aPTT: 31 seconds (ref 24–37)

## 2010-06-24 DIAGNOSIS — Z7901 Long term (current) use of anticoagulants: Secondary | ICD-10-CM

## 2010-06-24 DIAGNOSIS — T8450XA Infection and inflammatory reaction due to unspecified internal joint prosthesis, initial encounter: Secondary | ICD-10-CM

## 2010-06-24 LAB — CBC
MCH: 37.9 pg — ABNORMAL HIGH (ref 26.0–34.0)
MCHC: 34.5 g/dL (ref 30.0–36.0)
MCV: 109.9 fL — ABNORMAL HIGH (ref 78.0–100.0)
Platelets: 206 10*3/uL (ref 150–400)
RBC: 2.32 MIL/uL — ABNORMAL LOW (ref 3.87–5.11)
RDW: 16.9 % — ABNORMAL HIGH (ref 11.5–15.5)

## 2010-06-24 LAB — BASIC METABOLIC PANEL
BUN: 4 mg/dL — ABNORMAL LOW (ref 6–23)
Calcium: 7.5 mg/dL — ABNORMAL LOW (ref 8.4–10.5)
Chloride: 99 mEq/L (ref 96–112)
Creatinine, Ser: 0.49 mg/dL (ref 0.4–1.2)

## 2010-06-25 DIAGNOSIS — T8450XA Infection and inflammatory reaction due to unspecified internal joint prosthesis, initial encounter: Secondary | ICD-10-CM

## 2010-06-25 DIAGNOSIS — Z7901 Long term (current) use of anticoagulants: Secondary | ICD-10-CM

## 2010-06-25 LAB — CBC
MCV: 110.3 fL — ABNORMAL HIGH (ref 78.0–100.0)
Platelets: 207 10*3/uL (ref 150–400)
RDW: 16.7 % — ABNORMAL HIGH (ref 11.5–15.5)
WBC: 4.8 10*3/uL (ref 4.0–10.5)

## 2010-06-25 LAB — BASIC METABOLIC PANEL
BUN: 3 mg/dL — ABNORMAL LOW (ref 6–23)
Creatinine, Ser: 0.48 mg/dL (ref 0.4–1.2)
GFR calc non Af Amer: >60 mL/min (ref >60)
Potassium: 3.8 mEq/L (ref 3.5–5.1)

## 2010-06-25 LAB — VANCOMYCIN, TROUGH: Vancomycin Tr: 36.8 ug/mL (ref 10.0–20.0)

## 2010-06-25 NOTE — Consult Note (Signed)
[name], [name]          ACCOUNT NO.:  [id number]  MEDICAL RECORD NO.:  [id number]           PATIENT TYPE:  I  LOCATION:  1526                         FACILITY:  [facility]  PHYSICIAN:  Cindra Eves, D.D.S.DATE OF BIRTH:  1945-07-12  DATE OF CONSULTATION:  06/24/2010 DATE OF DISCHARGE:                                CONSULTATION   REFERRING PHYSICIAN:  Madlyn Frankel. Charlann Boxer, M.D.  HISTORY:  [name] is a 65 year old female referred by Dr. Durene Romans for dental consultation.  The patient was admitted for a right total knee arthroplasty procedure when cellulitis of lower left leg was noted.  Dental consultation was requested to evaluate poor dentition and to rule out dental infection that may affect the patient's systemic health anticipated total knee replacement.  MEDICAL HISTORY.: 1. Infected right total knee arthroplasty.     a.     Status post removal of the implant and placement of an      antibiotic spacer.     b.     Anticipated removal of antibiotic spacer insertion of total      knee replacement arthroplasty.     c.     Current cellulitis of lower left leg with postponement of      the total knee replacement pending resolution of the infection. 2. Hypertension. 3. Asthma. 4. Atrial fibrillation status post multiple ablation and cardioversion     procedures.  The patient with current Amiodarone and Pradaxa     therapies. 5. Gastroesophageal reflux disorder. 6. Chronic proctitis. 7. Rheumatoid arthritis. 8. History of vitamin B12 deficiency. 9. Status post bilateral total knee arthroplasties with revision of     the right total knee arthroplasty and subsequent infection. 10.Status post right and left cataract surgeries.  ALLERGIES/ADVERSE DRUG REACTION: 1. PENICILLIN causes hives. 2. SULFA causes anaphylactic shock. 3. AVELOX causes nausea, vomiting, diarrhea.  MEDICATIONS: 1. Amiodarone 200 mg twice daily. 2. Acetaminophen 1000 mg IV every 8  hours. 3. Pradaxa 150 mg every 12 hours. 4. Colace 100 mg twice daily. 5. Iron sulfate 325 mg 3 times daily. 6. Singular 10 mg at bedtime. 7. Dulera 100/5 inhalation therapy 2 puffs twice daily. 8. Ethacrynic acid 25 mg 2 tablets twice daily. 9. Oxycodone 5 to 15 mg every 4 hours as needed. 10.Spirolactone 100 mg twice daily. 11.Vancomycin 1000 mg IV every 12 hours.  SOCIAL HISTORY:  The patient is widowed.  The patient is disabled.  The patient currently lives alone.  The patient is a nonsmoker, nondrinker.  FAMILY HISTORY:  Mother died of congestive heart failure at the age of 77.  Father died at age of 79 with spinal meningitis..  FUNCTIONAL ASSESSMENT:  The patient remains independent for ADLs prior to this admission.  REVIEW OF SYSTEMS:  This is reviewed from the chart for this admission.  DENTAL HISTORY  CHIEF COMPLAINT:  Dental consultation requested to evaluate poor lower dentition.Marland Kitchen  HISTORY OF PRESENT ILLNESS:  The patient presented for right total knee arthroplasty.  The patient subsequently identified to have a cellulitis of the left leg with postponement of the total knee replacement.  The patient subsequent found to have poor dentition  and dental consultation was requested to rule out dental infection that may affect the patient's stomach health in anticipated total knee arthroplasty.  The patient currently denies acute toothache, swellings or abscesses but knows that she has "many bad lower teeth."  The patient indicates that she has not seen a dentist in "a while."  The patient indicates that her last dental treatment was for a dental extraction  which was achieved without complications.  The patient has upper complete denture that was fabricated approximately 16 years ago and "does not fit well."  The patient indicates that she did have a lower partial denture that was fabricated 16 years ago but the patient was unable to wear and has not worn since  then.  DENTAL EXAMINATION:  GENERAL:  The patient is a well-developed, well- nourished female, in no acute distress. VITAL SIGNS:  Blood pressure is 86/57, pulse rate is 76, respirations are 18, temperature 98.3. HEAD AND NECK EXAM:  There is no palpable submandibular lymphadenopathy. The patient denies acute TMJ symptoms. INTRAORAL EXAM:  The patient with normal saliva.  There is no evidence of abscess formation within the mouth.  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  The patient has a small palatal torus as well.. DENTITION:  The patient is missing all maxillary teeth as well as tooth numbers 17, 18, 19, 20, 29, 30, 31 and 32. DENTAL CARIES:  There are multiple dental caries noted be affecting the remaining teeth.  I would need a full series of lower periapical radiographs to identify the extent of the dental caries. ENDODONTIC:  The patient currently denies acute pulpitis symptoms.  I would need dental x-rays to rule out periapical pathology and radiolucency. CROWN OR BRIDGE:  There are no crown or bridge restorations noted. PROSTHODONTIST:  The patient with an upper complete denture which is ill fitting.  The patient has a history of lower partial denture but she has not worn this since it was inserted approximately 16 years ago.  The patient would need significant preprosthetic surgery prior to successful denture fabrication. OCCLUSION:  The patient with a poor occlusal scheme secondary to multiple missing teeth and ill-fitting dentures and lack of replacement of all missing teeth with dental prosthesis.  ASSESSMENT: 1. Chronic periodontitis with bone loss. 2. Plaque calculus accumulations. 3. Gingival recession. 4. Incipient tooth mobility. 5. Multiple missing teeth. 6. Multiple dental caries. 7. Ill-fitting maxillary complete denture and history of ill-fitting     mandibular partial denture. 8. Poor  occlusal scheme. 9. 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 10.Pradaxa therapy with risk for bleeding with invasive dental     procedures.  PLAN/RECOMMENDATIONS:  1. I discussed the risks, benefits, and complications of various treatment options with the patient in relationship to her medical and dental conditions.  We discussed various treatment options to include no treatment, extraction of remaining teeth with alveoloplasty, preprosthetic surgery as indicated for the removal of the mandibular tori and exostoses, periodontal therapy, dental restorations, root canal therapy, crown or bridge therapy, implant therapy, and replacing missing teeth as indicated after adequate healing.  The patient currently wishes to proceed with extraction of all remaining lower teeth with necessary preprosthetic surgery as indicated. We will attempt to schedule this for this inpatient admission.  We will need to discontinue the Pradaxa therapy for significant amount of time due to potential risk for extensive bleeding with the extractions and preprosthetic surgery.  We will contact Dr. Charlann Boxer  to ensure the we are able to discontinue Pradaxa therapy range and institute Lovenox therapy until the procedure can be formed in the operating room in [facility].  Operating procedures tentatively have been scheduled for Jun 30, 2010, at 7:30 in the morning. 2. Discussion of findings with Dr. Charlann Boxer concerning the plan of care,     need to discontinue the Pradaxa therapy and institute Lovenox     therapy, and scheduling of operating procedure for next Tuesday.     We will contact pharmacy concerning institution of Lovenox therapy     per protocols. 3. We will attempt to obtain some form of dental radiographs either     Thursday or Friday to assist in overall provision of the treatment     plan as indicated.          ______________________________ Cindra Eves,  D.D.S.     RK/MEDQ  D:  06/25/2010  T:  06/25/2010  Job:  [id number]  cc:   Madlyn Frankel Charlann Boxer, M.D. Fax: [phone]  Cindra Eves, D.D.S. Fax: [phone]  Electronically Signed by Cindra Eves D.D.S. on 06/25/2010 02:20:23 PM

## 2010-06-26 NOTE — Op Note (Signed)
[name], [name]                    ACCOUNT NO.:  [id number]   MEDICAL RECORD NO.:  [id number]                   PATIENT TYPE:  AMB   LOCATION:  [facility]                                 FACILITY:  [facility]   PHYSICIAN:  Ronald L. Ovidio Hanger, M.D.           DATE OF BIRTH:  1946/02/05   DATE OF PROCEDURE:  02/26/2003  DATE OF DISCHARGE:                                 OPERATIVE REPORT   PREOPERATIVE DIAGNOSIS:  Bladder lesion.   OPERATION/PROCEDURE:  1. Cystourethroscopy.  2. Bladder biopsy.   SURGEON:  Lucrezia Starch. Earlene Plater, M.D.   ANESTHESIA:  LMA.   ESTIMATED BLOOD LOSS:  Negligible.   TUBES:  None.   COMPLICATIONS:  None.   INDICATIONS FOR PROCEDURE:  Mrs. Hardigree is a very nice 65 year old  white female who initially presented with left upper abdominal pain and pain  in the right side.  She notes she has been having some nausea and vomiting  for two to three months and was subsequently noted to have one episode of  gross blood with the right flank pain.  She was found on urinalysis to have  a rare white cell and rare red cell per high-power field and CT scan of the  abdomen and pelvis with and without contrast revealed no significant  abnormalities.  Cystourethroscopy in the office on February 20, 2003 revealed  some raised, inflamed areas in the posterior bladder wall and laterally.  It  was felt that biopsy was indicated.  After understanding the risks, benefits  and alternatives, she has elected to proceed.   DESCRIPTION OF PROCEDURE:  The patient was placed in the supine position.  After proper LMA anesthesia, was placed in the dorsal lithotomy position,  prepped in the midline sterile fashion.  Cystourethroscopy was performed  with a 22.5-French Olympus panendoscope utilizing the 12-degree and 70-  degree lenses.  Bladder was carefully inspected.  Efflux of clear urine was  coming from the normally placed ureteral orifices bilaterally.  On the  posterior midline,  there was indurated type area.  It was somewhat diffuse  but did not extend to the lateral walls and no other lesions were noted to  be present.  There was no frank lesion other than the redness.  Utilizing  the cold cup biopsy forceps, two biopsies were obtained from that and  submitted as posterior midline.  The bases were cauterized with Bugbee  coagulation cautery.  No other lesions were noted to be present.  The  bladder was drained.  Panendoscope was removed and the patient was taken to  the recovery room stable.                                               Ronald L. Ovidio Hanger, M.D.    RLD/MEDQ  D:  02/26/2003  T:  02/26/2003  Job:  [id number]

## 2010-06-29 DIAGNOSIS — T8450XA Infection and inflammatory reaction due to unspecified internal joint prosthesis, initial encounter: Secondary | ICD-10-CM

## 2010-06-29 LAB — CREATININE, SERUM: GFR calc Af Amer: >60 mL/min (ref >60)

## 2010-06-29 NOTE — Op Note (Signed)
[name], [name]          ACCOUNT NO.:  [id number]  MEDICAL RECORD NO.:  [id number]           PATIENT TYPE:  I  LOCATION:  1526                         FACILITY:  [facility]  PHYSICIAN:  Madlyn Frankel. Charlann Boxer, M.D.  DATE OF BIRTH:  [birth year]  DATE OF PROCEDURE:  06/23/2010 DATE OF DISCHARGE:                              OPERATIVE REPORT   PREOPERATIVE DIAGNOSIS:  Status post resection of infected right total knee replacement.  POSTOPERATIVE DIAGNOSIS:  Status post resection of infected right total knee replacement.  PROCEDURE:  Repeat I&D and placement of new antibiotic spacer using vancomycin with gentamicin impregnated cement.  SURGEON:  Madlyn Frankel. Charlann Boxer, M.D.  ASSISTANT:  [name]. Chabon, PA-C.  ANESTHESIA:  General.  BLOOD LOSS:  About 50 mL.  COMPLICATIONS:  None.  DRAINS:  One Hemovac.  TOURNIQUET TIME:  About 30 minutes at 250 mmHg.  INDICATION FOR THE PROCEDURE:  [name] is a 65 year old female. Patient now with previous right knee revision complicated by infection. She had resection of her joint and was treated with antibiotics.  About a week prior to her presenting to the operating room, she was noted to have a cellulitic lesion on her left leg.  She was treated on oral antibiotics after being on IV antibiotics.  She is noted at the time of this procedure to have some ulcerations for skin medially and laterally on the left leg.  The contralateral leg as well as small area on the skin around the proximal lateral leg away from the incision site.  It is also noted preoperatively her very extensive poor dictation.  After reviewing the risks and benefits of proceeding with reimplantation in this setting, we decided to proceed with reimplantation as this was the planned procedure.  Consent was obtained for the above.  PROCEDURE IN DETAIL:  The patient was brought to the operative theater. Once adequate anesthesia, preoperative antibiotics,  vancomycin administered, she was positioned supine on the operative table with a thigh tourniquet placed.  The right lower extremity as then prepped and draped in sterile fashion following pre scrub.  Closer inspection of the areas noted indications that were alarming to me.  She also had an area where a suture had broken through the skin and healed with eschar present.  A time-out was performed identifying the patient, planned procedure, and the extremity as well as the potential to alternative surgical plan.  Leg was exsanguinated, tourniquet elevated to 250 mmHg.  Midline incision was made through her old incision excising this area of eschar where there was a suture that was readily identified right underneath the skin.  Soft tissue planes were created.  Median arthrotomy was made removing all old sutures visible.  The knee itself did not have any significant purulence inside the knee, however, upon removal of the cement spacer blocks, there was concern for some nonviable tissue particularly around the bone.  Once this removed, the bone edges looked and freed okay.  I made a clinical decision at this point to abort from reimplanting her knee due to the lesions are found on the proximal leg and as well as a left leg lower extremity  lesions and cellulitis in addition to her poor dentition.  I felt with the bone loss that she already have from previous revision surgery and resection to go through the major revision reimplantation in the setting may result in recurrent infection this versus her being on antibiotics.  For this reason we thoroughly I&D her knee used pulse lavage irrigation with a canal brush irrigator for the tibial and femoral canal.  We irrigated the wound with 6 liters of normal saline solution.  Meanwhile 3  batches of cement with gentamicin impregnated cement was mixed with 6 grams of vancomycin the cement was then formed into blocks and placed in the proximal  tibia distal femur and then the tibial femoral space.  At this point,  the knee was re-irrigated, the cement fully cured in the knee.  The medium Hemovac drain was placed deep and extensor mechanism was then reapproximated using #1 Vicryl.  The remainder of wound was closed with 2-0 Vicryl and staples on the skin.  The skin was cleaned, dried, and dressed sterilely using Xeroform and bulky sterile wrap.  She was brought to recovery room extubated in stable condition tolerating the procedure well.     Madlyn Frankel Charlann Boxer, M.D.     MDO/MEDQ  D:  06/23/2010  T:  06/23/2010  Job:  [id number]  Electronically Signed by Durene Romans M.D. on 06/29/2010 01:03:47 PM

## 2010-06-30 DIAGNOSIS — K0530 Chronic periodontitis, unspecified: Secondary | ICD-10-CM

## 2010-06-30 DIAGNOSIS — M278 Other specified diseases of jaws: Secondary | ICD-10-CM

## 2010-06-30 LAB — CBC
Hemoglobin: 8.4 g/dL — ABNORMAL LOW (ref 12.0–15.0)
MCH: 37.2 pg — ABNORMAL HIGH (ref 26.0–34.0)
MCV: 108.8 fL — ABNORMAL HIGH (ref 78.0–100.0)
RBC: 2.26 MIL/uL — ABNORMAL LOW (ref 3.87–5.11)

## 2010-06-30 LAB — BASIC METABOLIC PANEL
BUN: <3 mg/dL — ABNORMAL LOW (ref 6–23)
Calcium: 8 mg/dL — ABNORMAL LOW (ref 8.4–10.5)
Creatinine, Ser: 0.51 mg/dL (ref 0.4–1.2)
GFR calc Af Amer: >60 mL/min (ref >60)
GFR calc non Af Amer: >60 mL/min (ref >60)
Glucose, Bld: 81 mg/dL (ref 70–99)
Sodium: 134 mEq/L — ABNORMAL LOW (ref 135–145)

## 2010-07-01 ENCOUNTER — Other Ambulatory Visit (HOSPITAL_COMMUNITY): Payer: Medicare Other | Admitting: Dentistry

## 2010-07-01 NOTE — Op Note (Signed)
[name], [name]          ACCOUNT NO.:  [id number]  MEDICAL RECORD NO.:  [id number]           PATIENT TYPE:  I  LOCATION:  1526                         FACILITY:  [facility]  PHYSICIAN:  Cindra Eves, D.D.S.DATE OF BIRTH:  14-Dec-1945  DATE OF PROCEDURE:  06/30/2010 DATE OF DISCHARGE:                              OPERATIVE REPORT   PREOPERATIVE DIAGNOSES: 1. Infected total knee arthroplasty. 2. Chronic periodontitis. 3. Dental caries. 4. Retained root. 5. Multiple exostoses.  POSTOPERATIVE DIAGNOSES: 1. Infected total knee arthroplasty. 2. Chronic periodontitis. 3. Dental caries. 4. Retained root. 5. Multiple exostoses.  OPERATIONS: 1. Extraction of remaining teeth (tooth #21, 22, 23, 24, 25, 26, 27,     and 28). 2. Two quadrants of alveoloplasty. 3. Mandibular left and mandibular right tori reductions along with     lateral exostoses reductions.  SURGEON:  Cindra Eves, DDS  ASSISTANT:  Zettie Pho (dental assistant).  ANESTHESIA:  General anesthesia via nasoendotracheal tube, Dr. Rica Mast, attending.  MEDICATIONS: 1. Vancomycin IV, as per previous protocol. 2. Local anesthesia with a total utilization of 2 carpules each     containing 34 mg of lidocaine with 0.017 mg of epinephrine as well     as 2 carpules each containing 9 mg of bupivacaine with 0.009 mg of     epinephrine.  SPECIMENS:  There were 8 teeth that were discarded.  DRAINS:  None.  CULTURES:  None.  COMPLICATIONS:  None.  ESTIMATED BLOOD LOSS:  100 mL.  FLUIDS:  Lactated Ringer solution 1200 mL.  INDICATIONS:  The patient was previously admitted with a history of infected total knee arthroplasty.  Dental consultation was subsequently requested for evaluation of poor dentition and to rule out dental infection that may affect the patient's systemic health and anticipated redo of the total knee arthroplasty.  The patient was examined and treatment planned for extraction of  all remaining teeth with alveoloplasty and preprosthetic surgery is indicated.  OPERATIVE FINDINGS:  The patient was examined in operating room #3.  The teeth were identified for extraction.  The patient noted to be affected by chronic periodontitis, dental caries, retained root segments, and the presence of extensive bilateral mandibular lingual tori and exostoses.  DESCRIPTION OF PROCEDURE:  The patient was brought to the main operating room #3.  The patient was then placed in supine position on the operating room table.  General anesthesia was then induced per the anesthesia team via a nasoendotracheal tube.  The patient then prepped and draped in usual manner for dental medicine procedure.  A time-out was performed.  The patient was identified and procedures were verified. A throat pack was placed at this time.  The oral cavity was then thoroughly examined with findings as noted above.  The patient was then ready for the dental medicine procedure as follows:  Local anesthesia was administered sequentially with a total utilization of 2 carpules each containing 34 mg of lidocaine with 0.017 mg of epinephrine as well as 2 carpules each containing 9 mg of bupivacaine with 0.009 mg of epinephrine.  The mandibular left and right quadrants were approached.  Anesthesia delivered via an inferior alveolar nerve block to  the mandibular left and mandibular right quadrants along with a long buccal nerve block utilizing the bupivacaine with epinephrine.  Further infiltration was then achieved utilizing the lidocaine with epinephrine.  At this point in time, the mandibular left quadrant was approached.  A #15 blade incision was made from the distal of #17 and extended to the distal #32, surgical flap was then carefully reflected.  Appropriate amounts of buccal and interseptal bone were removed with a surgical handpiece and bur and copious amounts of sterile saline.  The teeth were then  subluxated with a series of straight elevators.  Tooth #21, 22, 23, 24, 25, 26, 27, and 28 were then removed with a 151 forceps without complications.  Alveoplasty was then performed utilizing rongeurs and bone file.  At this point in time, a surgical flap was reflected on the lingual aspect to expose the bilateral mandibular lingual tori and bilateral lateral exostoses that extended along the lateral border on the lingual aspect.  Surgical handpiece and bur and copious amounts of sterile saline were utilized to remove significant mandibular tori and exostoses, as indicated.  Alveoloplasty was then performed utilizing rongeurs and bone file.  The tissues were then approximated and trimmed appropriately.  The surgical sites were then irrigated with copious amounts of sterile saline x6.  The mandibular left surgical site was then closed from distal #17 and extended to the mesial #24 utilizing 3-0 chromic gut suture in a continuous interrupted suture technique x1.  The mandibular right surgical site was then closed from distal #32 and extended to the mesial #25 utilizing 3-0 chromic gut suture in a continuous interrupted suture technique x1.  At this point in time, 3 interrupted sutures were then placed to further close the surgical site as indicated.  The entire mouth was then irrigated with copious amounts of sterile saline.  The patient was examined for complications, seeing none, dental medicine procedure deemed to be complete.  Throat pack was removed at this time.  A series of 4 x 4 gauze were placed in the mouth to aid hemostasis.  An oral airway was then placed at the request of the anesthesia team.  The patient was then handed over to the anesthesia team for final disposition.  After appropriate amount of time, the patient was extubated and taken to the post-anesthesia care unit with stable vital signs and good oxygenation level.  All counts were correct for dental medicine  procedure.  The patient will be seen approximately 7-10 days for evaluation for suture removal.  In the meantime, the patient will return to her room and be discharged at the discretion of Dr. Durene Romans.          ______________________________ Cindra Eves, D.D.S.     RK/MEDQ  D:  06/30/2010  T:  07/01/2010  Job:  [id number]  cc:   Madlyn Frankel Charlann Boxer, M.D. Fax: [phone]  Electronically Signed by Cindra Eves D.D.S. on 07/01/2010 08:52:43 AM

## 2010-07-04 ENCOUNTER — Emergency Department (HOSPITAL_COMMUNITY)
Admission: EM | Admit: 2010-07-04 | Discharge: 2010-07-04 | Disposition: A | Discharge status: Home / Self Care | Payer: Medicare Other | Attending: Emergency Medicine | Admitting: Emergency Medicine

## 2010-07-04 DIAGNOSIS — I4891 Unspecified atrial fibrillation: Secondary | ICD-10-CM | POA: Insufficient documentation

## 2010-07-04 DIAGNOSIS — Y831 Surgical operation with implant of artificial internal device as the cause of abnormal reaction of the patient, or of later complication, without mention of misadventure at the time of the procedure: Secondary | ICD-10-CM | POA: Insufficient documentation

## 2010-07-04 DIAGNOSIS — T8140XA Infection following a procedure, unspecified, initial encounter: Secondary | ICD-10-CM | POA: Insufficient documentation

## 2010-07-04 LAB — POCT I-STAT, CHEM 8
BUN: <3 mg/dL — ABNORMAL LOW (ref 6–23)
Calcium, Ion: 1.05 mmol/L — ABNORMAL LOW (ref 1.12–1.32)
Hemoglobin: 9.9 g/dL — ABNORMAL LOW (ref 12.0–15.0)
Sodium: 134 mEq/L — ABNORMAL LOW (ref 135–145)
TCO2: 25 mmol/L (ref 0–100)

## 2010-07-04 LAB — CBC
Hemoglobin: 9.6 g/dL — ABNORMAL LOW (ref 12.0–15.0)
MCH: 37.1 pg — ABNORMAL HIGH (ref 26.0–34.0)
MCHC: 35 g/dL (ref 30.0–36.0)
Platelets: 326 10*3/uL (ref 150–400)
RBC: 2.59 MIL/uL — ABNORMAL LOW (ref 3.87–5.11)

## 2010-07-04 LAB — DIFFERENTIAL
Basophils Absolute: 0 10*3/uL (ref 0.0–0.1)
Basophils Relative: 1 % (ref 0–1)
Eosinophils Absolute: 0.1 10*3/uL (ref 0.0–0.7)
Monocytes Relative: 15 % — ABNORMAL HIGH (ref 3–12)
Neutro Abs: 3.2 10*3/uL (ref 1.7–7.7)
Neutrophils Relative %: 67 % (ref 43–77)

## 2010-07-05 ENCOUNTER — Ambulatory Visit (HOSPITAL_COMMUNITY)
Admission: EM | Admit: 2010-07-05 | Discharge: 2010-07-05 | Disposition: A | Discharge status: Home / Self Care | Payer: Medicare Other | Source: Ambulatory Visit | Attending: Emergency Medicine | Admitting: Emergency Medicine

## 2010-07-05 DIAGNOSIS — L089 Local infection of the skin and subcutaneous tissue, unspecified: Secondary | ICD-10-CM | POA: Insufficient documentation

## 2010-07-05 DIAGNOSIS — M7989 Other specified soft tissue disorders: Secondary | ICD-10-CM | POA: Insufficient documentation

## 2010-07-05 DIAGNOSIS — M79609 Pain in unspecified limb: Secondary | ICD-10-CM

## 2010-07-06 DIAGNOSIS — K08109 Complete loss of teeth, unspecified cause, unspecified class: Secondary | ICD-10-CM

## 2010-07-07 ENCOUNTER — Ambulatory Visit (HOSPITAL_COMMUNITY): Payer: Self-pay | Admitting: Dentistry

## 2010-07-29 ENCOUNTER — Ambulatory Visit (HOSPITAL_COMMUNITY)
Admission: RE | Admit: 2010-07-29 | Discharge: 2010-07-29 | Disposition: A | Discharge status: Home / Self Care | Payer: Medicare Other | Source: Ambulatory Visit | Attending: Orthopedic Surgery | Admitting: Orthopedic Surgery

## 2010-07-29 ENCOUNTER — Observation Stay (HOSPITAL_COMMUNITY)
Admission: EM | Admit: 2010-07-29 | Discharge: 2010-07-30 | Disposition: A | Discharge status: Home / Self Care | Payer: Medicare Other | Attending: Internal Medicine | Admitting: Internal Medicine

## 2010-07-29 DIAGNOSIS — Z01810 Encounter for preprocedural cardiovascular examination: Secondary | ICD-10-CM | POA: Insufficient documentation

## 2010-07-29 DIAGNOSIS — Y831 Surgical operation with implant of artificial internal device as the cause of abnormal reaction of the patient, or of later complication, without mention of misadventure at the time of the procedure: Secondary | ICD-10-CM | POA: Insufficient documentation

## 2010-07-29 DIAGNOSIS — Z96659 Presence of unspecified artificial knee joint: Secondary | ICD-10-CM | POA: Insufficient documentation

## 2010-07-29 DIAGNOSIS — M7989 Other specified soft tissue disorders: Secondary | ICD-10-CM | POA: Insufficient documentation

## 2010-07-29 DIAGNOSIS — Z7901 Long term (current) use of anticoagulants: Secondary | ICD-10-CM | POA: Insufficient documentation

## 2010-07-29 DIAGNOSIS — I4891 Unspecified atrial fibrillation: Secondary | ICD-10-CM | POA: Insufficient documentation

## 2010-07-29 DIAGNOSIS — Z01812 Encounter for preprocedural laboratory examination: Secondary | ICD-10-CM | POA: Insufficient documentation

## 2010-07-29 DIAGNOSIS — M069 Rheumatoid arthritis, unspecified: Secondary | ICD-10-CM | POA: Insufficient documentation

## 2010-07-29 DIAGNOSIS — J449 Chronic obstructive pulmonary disease, unspecified: Secondary | ICD-10-CM | POA: Insufficient documentation

## 2010-07-29 DIAGNOSIS — I82819 Embolism and thrombosis of superficial veins of unspecified lower extremities: Secondary | ICD-10-CM | POA: Insufficient documentation

## 2010-07-29 DIAGNOSIS — I824Z9 Acute embolism and thrombosis of unspecified deep veins of unspecified distal lower extremity: Principal | ICD-10-CM | POA: Insufficient documentation

## 2010-07-29 DIAGNOSIS — E538 Deficiency of other specified B group vitamins: Secondary | ICD-10-CM | POA: Insufficient documentation

## 2010-07-29 DIAGNOSIS — M79609 Pain in unspecified limb: Secondary | ICD-10-CM | POA: Insufficient documentation

## 2010-07-29 DIAGNOSIS — K219 Gastro-esophageal reflux disease without esophagitis: Secondary | ICD-10-CM | POA: Insufficient documentation

## 2010-07-29 DIAGNOSIS — T8450XA Infection and inflammatory reaction due to unspecified internal joint prosthesis, initial encounter: Secondary | ICD-10-CM | POA: Insufficient documentation

## 2010-07-29 DIAGNOSIS — Z79899 Other long term (current) drug therapy: Secondary | ICD-10-CM | POA: Insufficient documentation

## 2010-07-29 DIAGNOSIS — J4489 Other specified chronic obstructive pulmonary disease: Secondary | ICD-10-CM | POA: Insufficient documentation

## 2010-07-29 LAB — CBC
HCT: 32.9 % — ABNORMAL LOW (ref 36.0–46.0)
Hemoglobin: 12 g/dL (ref 12.0–15.0)
MCH: 38.2 pg — ABNORMAL HIGH (ref 26.0–34.0)
MCHC: 36.5 g/dL — ABNORMAL HIGH (ref 30.0–36.0)
RBC: 3.14 MIL/uL — ABNORMAL LOW (ref 3.87–5.11)

## 2010-07-29 LAB — VANCOMYCIN, RANDOM: Vancomycin Rm: 16.8 ug/mL

## 2010-07-29 LAB — DIFFERENTIAL
Basophils Absolute: 0 10*3/uL (ref 0.0–0.1)
Basophils Relative: 1 % (ref 0–1)
Lymphocytes Relative: 20 % (ref 12–46)
Neutro Abs: 2.9 10*3/uL (ref 1.7–7.7)
Neutrophils Relative %: 65 % (ref 43–77)

## 2010-07-29 LAB — CK TOTAL AND CKMB (NOT AT ARMC)
CK, MB: 1.9 ng/mL (ref 0.3–4.0)
Total CK: 29 U/L (ref 7–177)

## 2010-07-29 LAB — POCT I-STAT, CHEM 8
BUN: <3 mg/dL — ABNORMAL LOW (ref 6–23)
Hemoglobin: 12.9 g/dL (ref 12.0–15.0)
Potassium: 3.8 mEq/L (ref 3.5–5.1)
Sodium: 136 mEq/L (ref 135–145)
TCO2: 27 mmol/L (ref 0–100)

## 2010-07-29 LAB — TROPONIN I: Troponin I: <0.3 ng/mL (ref <0.30)

## 2010-07-29 LAB — PROTIME-INR
INR: 0.89 (ref 0.00–1.49)
Prothrombin Time: 12.2 seconds (ref 11.6–15.2)

## 2010-07-30 LAB — CBC
HCT: 27.8 % — ABNORMAL LOW (ref 36.0–46.0)
MCHC: 35.6 g/dL (ref 30.0–36.0)
MCV: 106.1 fL — ABNORMAL HIGH (ref 78.0–100.0)
Platelets: 182 10*3/uL (ref 150–400)
RDW: 15.3 % (ref 11.5–15.5)
WBC: 3.9 10*3/uL — ABNORMAL LOW (ref 4.0–10.5)

## 2010-07-30 LAB — CARDIAC PANEL(CRET KIN+CKTOT+MB+TROPI)
Relative Index: INVALID (ref 0.0–2.5)
Total CK: 21 U/L (ref 7–177)

## 2010-07-30 LAB — COMPREHENSIVE METABOLIC PANEL
Alkaline Phosphatase: 65 U/L (ref 39–117)
BUN: 6 mg/dL (ref 6–23)
Chloride: 102 mEq/L (ref 96–112)
Creatinine, Ser: 0.5 mg/dL (ref 0.50–1.10)
GFR calc Af Amer: >60 mL/min (ref >60)
GFR calc non Af Amer: >60 mL/min (ref >60)
Glucose, Bld: 62 mg/dL — ABNORMAL LOW (ref 70–99)
Potassium: 3.4 mEq/L — ABNORMAL LOW (ref 3.5–5.1)
Total Bilirubin: 0.3 mg/dL (ref 0.3–1.2)

## 2010-07-30 LAB — TSH: TSH: 1.951 u[IU]/mL (ref 0.350–4.500)

## 2010-08-09 NOTE — H&P (Signed)
NAMEDEVLIN, MCVEIGH          ACCOUNT NO.:  [id number]  MEDICAL RECORD NO.:  [id number]  LOCATION:  [facility]                         FACILITY:  [facility]  PHYSICIAN:  Lonia Blood, M.D.      DATE OF BIRTH:  1945/05/26  DATE OF ADMISSION:  07/29/2010 DATE OF DISCHARGE:                             HISTORY & PHYSICAL   PRIMARY CARE PHYSICIAN:  Unassigned to [place] but follows up with mainly Orthopedics, Dr. Charlann Boxer and also Cardiology for atrial fibrillation.  PRESENTING COMPLAINT:  Right leg blood clot.  HISTORY OF PRESENT ILLNESS:  The patient is a 65 year old female with history of atrial fibrillation but also rheumatoid arthritis status post multiple surgeries.  She had right total knee replacement that became infected and she was here earlier this year on March 14 through the 23rd.  At that time, she had repeat surgery for the infected right total knee arthroplasty.  Postoperatively, she was placed on Lovenox.  Prior to that, she has been on Pradaxa for atrial fibrillation.  She was given daily dose of Lovenox for DVT prophylaxis prior to getting her back on her Pradaxa.  She went home but has not been mobile as she normally is. She started having progressive right lower extremity swelling, pain, and tenderness.  She was sent to different outpatient Doppler ultrasound of right lower extremity which showed extensive deep vein thrombosis.  She has been getting IV vancomycin via a PICC line at home.  The fact that the patient had DVT on Pradaxa, she was sent over to the hospital for further management.  She denied any shortness of breath.  No chest pain. Denied any fever or chills.  No nausea, vomiting, or diarrhea.  PAST MEDICAL HISTORY:  Atrial fibrillation with multiple prior ablations attempted and cardioversions.  She is currently on Pradaxa.  History of multiple knee surgeries with infected right prosthesis in May of this year, GERD, asthma, COPD, rheumatoid arthritis, B12 deficiency,  history of cataracts with surgeries.  ALLERGIES:  AVEENO, PENICILLIN, and SULFA.  CURRENT MEDICATIONS: 1. Amiodarone 200 mg b.i.d. 2. Aldactone 100 mg b.i.d. 3. Singulair 10 mg at bedtime. 4. Neurontin 300 mg daily. 5. IV vancomycin at home. 6. Dulera 2 puffs twice a day. 7. Prevacid 30 mg daily. 8. Singulair 10 mg daily. 9. Edecrin 25 mg 4 tablets daily.  SOCIAL HISTORY:  She lives alone at home.  Denied any tobacco or alcohol use.  She is widowed and disabled.  FAMILY HISTORY:  Significant for congestive heart failure and meningitis in member of the family.  REVIEW OF SYSTEMS:  All system reviewed and negative except per HPI.  PHYSICAL EXAMINATION:  VITAL SIGNS:  Temperature 98.4, blood pressure 129/60, pulse is 73, respiratory rate 18, sats 98% on room air. GENERAL:  The patient is awake, alert, oriented, sitting in wheelchair. She is in no acute distress, just frustrated. HEENT:  PERRL.  EOMI.  No pallor, no jaundice, no rhinorrhea.  She has very poor dentition. NECK:  Supple.  No evidence of JVD, no lymphadenopathy. RESPIRATORY:  Good air entry bilaterally.  No wheezes, no rales. CARDIOVASCULAR SYSTEM:  Irregularly irregular rhythm. ABDOMEN:  Soft, nontender with positive bowel sounds. EXTREMITIES:  Right lower extremity is  swollen, tender, immobilized, red to touch and slightly warm.  No other rashes or ulcers seen.  LABORATORY DATA:  Sodium is 136, potassium 3.8, chloride 98, glucose 73, BUN less than 3, creatinine 0.60 with ionized calcium 1.1.  White count is 4.4, hemoglobin 12.0, and platelets of 232,000.  PT 12.2, INR 0.89, PTT of 34.  ASSESSMENT:  This is a 65 year old female presenting with right lower extremity deep venous thrombosis.  The patient has been on Pradaxa but has not been approved for deep venous thrombosis treatment anyway.  The cause of her deep venous thrombosis is more than likely triggered by the recent surgery and hospitalization she had.   Also she has had decreased mobility since then.  PLAN: 1. Right lower extremity DVT.  We will admit the patient, take her off     the Pradaxa and start Lovenox and Coumadin.  The patient has     experience with both Lovenox and Coumadin in the past.  She has     been on both apparently.  This is therefore not a shock to her at     all.  Once she stabilizes on those 2 medications, we will send her     home for home therapy with both the Lovenox and Coumadin. 2. Atrial fibrillation.  This is chronic.  She is on Coumadin.  We     will continue.  She is on Pradaxa.  Again we will switch her to     Coumadin which should take care of both her DVT and atrial     fibrillation. 3. GERD.  Continue PPI. 4. Asthma, COPD.  Empiric nebulizers.  She is discharged, can resume     her Dulera. 5. Rheumatoid arthritis.  Again stable.  She has B12 deficiency, on     outpatient therapy.     Lonia Blood, M.D.     Verlin Grills  D:  07/29/2010  T:  07/29/2010  Job:  [id number]  Electronically Signed by Lonia Blood M.D. on 08/09/2010 12:35:41 AM

## 2010-08-10 NOTE — H&P (Signed)
[name], [name]          ACCOUNT NO.:  [id number]  MEDICAL RECORD NO.:  [id number]  LOCATION:  2502                         FACILITY:  [facility]  PHYSICIAN:  Madlyn Frankel. Charlann Boxer, M.D.  DATE OF BIRTH:  [birth year]  DATE OF ADMISSION:  07/29/2010 DATE OF DISCHARGE:                             HISTORY & PHYSICAL   DATE OF SURGERY:  August 11, 2010.  ADMITTING DIAGNOSES:  Status post infected total knee arthroplasty with removal of implant, placement of antibiotic spacer, subsequent removal of that spacer, placement of another antibiotic spacer, and removal of decayed dentition.  Plan at this time is for reimplantation of total knee arthroplasty.  HISTORY OF PRESENT ILLNESS:  [name] with a history of total knee arthroplasty with subsequent infection, removal of the implant with antibiotic spacer.  She came back for reimplantation of her knee but still had infection.  At that time, was noted to have several decayed teeth and so subsequently, new antibiotic spacer was placed.  She was kept on IV antibiotics, had her decayed dentition removed, and is now scheduled for reimplantation of her total knee arthroplasty.  The surgery was discussed with the patient, questions invited, and answered. She will be going to a nursing facility postoperatively, and she is not a candidate for transatlantic acid, and will not receive that during surgery.  Note that, she is on Pradaxa and will go back on that postoperatively for her DVT prophylaxis.  PAST MEDICAL HISTORY:  Drug allergy to PENICILLIN with hives, SULFA with anaphylactic shock, and AVELOX with nausea, vomiting, and diarrhea.  CURRENT MEDICATIONS: 1. Amiodarone 200 mg 1/2 tablet b.i.d. 2. Dulera 100/5 mcg inhalation 2 puffs twice a day. 3. Edecrin 25 mg 4 tablets daily. 4. Gabapentin 300 mg 1 at bedtime. 5. Pradaxa 150 mg 1 b.i.d. 6. Prevacid 30 mg 1 daily. 7. Singular 10 mg at bedtime. 8. Spirolactone 25 mg 1 b.i.d. 9.  Ventolin 90 mcg inhaler 2 puffs q.i.d. p.r.n. 10.Currently vancomycin.  MEDICAL ILLNESSES:  Hypertension, asthma, atrial fibrillation, reflux, chronic bronchitis, rheumatoid arthritis, and B12 deficiency.  PREVIOUS SURGERIES:  Bilateral total knee arthroplasty with revision on the right and subsequent infection with placement of antibiotic spacers x2, cataracts bilaterally, and cardioversion.  FAMILY HISTORY:  Positive for meningitis and congestive heart failure.  SOCIAL HISTORY:  The patient is widowed.  She is disabled.  She lives alone.  She does not smoke and does not drink.  REVIEW OF SYSTEMS:  CENTRAL NERVOUS SYSTEM:  Negative for headache, blurred vision, or dizziness.  PULMONARY:  Positive for exertional shortness of breath and asthma.  CARDIOVASCULAR:  Positive for irregular heartbeat, and negative for chest pain or palpitation.  History of atrial fibrillation with previous cardioversion.  GI:  Negative for ulcers or hepatitis.  Positive for reflux.  GU:  Negative for urinary tract difficulty.  MUSCULOSKELETAL:  Positive as in HPI.  PHYSICAL EXAMINATION:  VITAL SIGNS:  BP 120/62, respirations 12, pulse 72 and regular. GENERAL APPEARANCE:  This is a well-developed, well-nourished lady in no acute distress. HEENT:  Head normocephalic.  Nose patent.  Ears patent.  Pupils equal, round, and reactive to light.  Throat without injection. NECK:  Supple without adenopathy.  Carotids are 2+ without bruit. CHEST:  Clear to auscultation.  No rales or rhonchi.  Respirations 12. HEART:  Regular rate and rhythm at 72 beats per minute without murmur. ABDOMEN:  Soft.  Active bowel sounds.  No masses or organomegaly. NEUROLOGIC:  The patient is alert and oriented to time, place, and person.  Cranial nerves II through XII grossly intact. EXTREMITIES:  The right knee in a knee immobilizer, well-healed scar. She has mild calf tenderness bilaterally, but no cords and negative Homans sign.  No  evidence of infection currently.  IMPRESSION:  Right knee status post total knee arthroplasty with infection and removal with antibiotic spacer.  PLAN:  Right knee reimplantation, total knee arthroplasty. Pt will have a doppler today due to multiple previous surgeries and calf tenderness. Surgery pending doppler results     [name]. Chabon, P.A.   ______________________________ Madlyn Frankel Charlann Boxer, M.D.    SJC/MEDQ  D:  07/29/2010  T:  07/30/2010  Job:  [id number]  Electronically Signed by Jodene Nam P.A. on 08/03/2010 12:49:10 PM Electronically Signed by Durene Romans M.D. on 08/10/2010 09:26:26 AM

## 2010-08-12 NOTE — Discharge Summary (Signed)
[name], [name]          ACCOUNT NO.:  [id number]  MEDICAL RECORD NO.:  [id number]  LOCATION:  2502                         FACILITY:  [facility]  PHYSICIAN:  Osvaldo Shipper, MD     DATE OF BIRTH:  11-May-1945  DATE OF ADMISSION:  07/29/2010 DATE OF DISCHARGE:  07/30/2010                              DISCHARGE SUMMARY   PRIMARY CARE PHYSICIAN:  Dr. Juleen China in [place], [place].  CARDIOLOGIST:  Dr. Chales Abrahams in [facility].  ORTHOPEDIC SURGEON:  Dr. Charlann Boxer.  Studies done during this hospitalization include a Doppler study which showed acute DVT in the right profunda vein.  No DVT in the left lower extremity.  No consultations obtained during this admission.  DISCHARGE DIAGNOSES: 1. Right lower extremity deep venous thrombosis requiring Lovenox plus     Coumadin. 2. History of right infected knee, currently on IV antibiotics. 3. History of atrial fibrillation.  BRIEF HOSPITAL COURSE:  Briefly, this is a 65 year old Caucasian female who was sent into the hospital because she was found to have a DVT in the right lower extremity.  The patient has had extensive orthopedic history with an infected right knee requiring surgical intervention back in May.  She required to be on IV vancomycin.  She currently has about 10-11 days left on that.  So the patient was on Pradaxa for AFib.  However, Pradaxa apparently is not approved for DVT.  So the Pradaxa will be stopped and she will be put on Lovenox and Coumadin.  Lovenox will be dosed once a day and INR will be checked on the 24th and once her INR is between 2 and 3 for 2 consecutive days, the Lovenox can be discontinued.  Rest of her medications are stable.  Today, June 21, she is afebrile.  Heart rate is in the 60s, respiratory rate is 16, blood pressure 101/56, saturation 96% on room air.  Lungs are clear to auscultation bilaterally with no wheezing, rales or rhonchi.  Cardiovascular, S1 and S2 is normal, regular.  No  S3, S4.  No rubs, murmurs or bruits.  Abdomen is soft, nontender, nondistended.  Her right lower extremity is in a cast.  No appreciable edema in the left lower extremity.  Labs today show a hemoglobin 9.9, white cell count is 3.9, platelet count is 182.  All these are back to baseline.  Her potassium was 3.4 which will be repleted, glucose was 62 this morning, she did without eating.  Albumin is 2.1.  Cardiac enzymes were negative.  DISCHARGE MEDICATIONS: 1. Lovenox 120 mg subcu once daily till INR between 2 and 3 for 2     days. 2. Vancomycin 1 g twice daily as before by home health. 3. Warfarin 5 mg every evening. 4. Amiodarone 200 mg twice daily. 5. Celebrex 200 mg every evening. 6. Dulera 2 puffs inhaled twice daily as needed for shortness of     breath. 7. Ethacrynic acid 25 mg 2 tablets p.o. b.i.d. 8. Vicodin 7.5/325 1-2 tablets every 4 hours as needed for pain. 9. Methocarbamol 500 mg every 6 hours as needed for muscle spasms. 10.Neurontin 300 mg p.o. at bedtime. 11.Prilosec 20 mg every evening. 12.Saline nasal spray 2 sprays nasally every  evening. 13.Singulair 10 mg every evening. 14.Spironolactone 25 mg 2 tablets twice daily. 15.We have asked her to discontinue her Pradaxa.  FOLLOWUP: 1. PT/INR by home health on June 24. 2. Follow up with her PCP Dr. Leonor Liv in 1 week. 3. With Dr. Chales Abrahams as needed.  She apparently scheduled for a right knee arthroplasty on July 3 by Dr. Charlann Boxer  DIET:  Heart-healthy.  Physical activity as before as instructed by Dr. Charlann Boxer.  Total time of this discharge encounter 35 minutes.  Osvaldo Shipper, MD     GK/MEDQ  D:  07/30/2010  T:  07/31/2010  Job:  [id number]  cc:   Constance Haw Dr. Leonor Liv Dr. Charlann Boxer  Electronically Signed by Osvaldo Shipper MD on 08/12/2010 07:23:37 PM

## 2010-08-25 DIAGNOSIS — K08109 Complete loss of teeth, unspecified cause, unspecified class: Secondary | ICD-10-CM

## 2010-09-07 ENCOUNTER — Inpatient Hospital Stay (HOSPITAL_COMMUNITY)
Admission: RE | Admit: 2010-09-07 | Discharge: 2010-09-10 | DRG: 467 | Disposition: A | Discharge status: Skilled Nursing Facility | Payer: Medicare Other | Source: Ambulatory Visit | Attending: Orthopedic Surgery | Admitting: Orthopedic Surgery

## 2010-09-07 DIAGNOSIS — J449 Chronic obstructive pulmonary disease, unspecified: Secondary | ICD-10-CM | POA: Diagnosis present

## 2010-09-07 DIAGNOSIS — I4891 Unspecified atrial fibrillation: Secondary | ICD-10-CM | POA: Diagnosis present

## 2010-09-07 DIAGNOSIS — J4489 Other specified chronic obstructive pulmonary disease: Secondary | ICD-10-CM | POA: Diagnosis present

## 2010-09-07 DIAGNOSIS — I82409 Acute embolism and thrombosis of unspecified deep veins of unspecified lower extremity: Secondary | ICD-10-CM | POA: Diagnosis present

## 2010-09-07 DIAGNOSIS — M21869 Other specified acquired deformities of unspecified lower leg: Principal | ICD-10-CM | POA: Diagnosis present

## 2010-09-07 DIAGNOSIS — I509 Heart failure, unspecified: Secondary | ICD-10-CM | POA: Diagnosis present

## 2010-09-07 DIAGNOSIS — Z7901 Long term (current) use of anticoagulants: Secondary | ICD-10-CM

## 2010-09-07 HISTORY — PX: OTHER SURGICAL HISTORY: SHX169

## 2010-09-07 LAB — URINALYSIS, ROUTINE W REFLEX MICROSCOPIC
Bilirubin Urine: NEGATIVE
Glucose, UA: NEGATIVE mg/dL
Hgb urine dipstick: NEGATIVE
Nitrite: NEGATIVE
Specific Gravity, Urine: 1.008 (ref 1.005–1.030)
pH: 6 (ref 5.0–8.0)

## 2010-09-07 LAB — DIFFERENTIAL
Basophils Absolute: 0 10*3/uL (ref 0.0–0.1)
Lymphocytes Relative: 17 % (ref 12–46)
Lymphs Abs: 0.7 10*3/uL (ref 0.7–4.0)
Monocytes Absolute: 0.4 10*3/uL (ref 0.1–1.0)
Neutro Abs: 2.8 10*3/uL (ref 1.7–7.7)

## 2010-09-07 LAB — CBC
HCT: 34.4 % — ABNORMAL LOW (ref 36.0–46.0)
Hemoglobin: 12.2 g/dL (ref 12.0–15.0)
MCHC: 35.5 g/dL (ref 30.0–36.0)
MCV: 103.9 fL — ABNORMAL HIGH (ref 78.0–100.0)

## 2010-09-07 LAB — PROTIME-INR: Prothrombin Time: 12.7 seconds (ref 11.6–15.2)

## 2010-09-07 LAB — BASIC METABOLIC PANEL
BUN: 4 mg/dL — ABNORMAL LOW (ref 6–23)
Chloride: 94 mEq/L — ABNORMAL LOW (ref 96–112)
GFR calc Af Amer: >60 mL/min (ref >60)
Potassium: 3 mEq/L — ABNORMAL LOW (ref 3.5–5.1)

## 2010-09-07 LAB — CROSSMATCH: Antibody Screen: NEGATIVE

## 2010-09-07 LAB — APTT: aPTT: 35 seconds (ref 24–37)

## 2010-09-07 NOTE — H&P (Unsigned)
NAME:  [name], [name]               ACCOUNT NO.:  MEDICAL RECORD NO.:  [id number]  LOCATION:                                 FACILITY:  PHYSICIAN:  Madlyn Frankel. Charlann Boxer, M.D.  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  HISTORY OF PRESENT ILLNESS:  This is a 65 year old lady with a history of total knee arthroplasty with infection who had the arthroplasty removed and antibiotic spacer placed.  On subsequent removal of spacer, she still had signs of infection and subsequently had another antibiotic spacer implanted.  Thereafter, when she was ready for repeat reimplantation, she was noted to have a DVT and has been undergoing treatment for that with Coumadin.  At this time, the patient is now ready for removal of antibiotic spacer and implantation of total knee arthroplasty.  We spoke with her medical doctor, Dr. Leonor Liv, and also Dr. Chales Abrahams, her cardiologist's office in [facility], and she is now clear to proceed with surgery.  Our plan for treatment for her DVT at surgery time will be to stop her Coumadin 5 days before surgery, bridge her with Lovenox 40 mg 1 daily for those 5 days with the last dose being the evening before surgery.  She will also stop her Pradaxa 1 day prior to surgery per Cardiology office.  Postoperatively, we will start her back on her Coumadin protocol and bridge her with Lovenox until she is therapeutic.  She wants to go to a nursing facility postoperatively and does not want to go to [facility], she would rather go to [facility] or [facility].  She was not given her home  medications.  Again note that her primary care physician is Dr. Valentino Hue and cardiologist, Dr. Constance Haw; Dr. Leonor Liv being in [place] and Dr. Chales Abrahams being in [facility]. The surgery risks, benefits, and aftercare were discussed in detail with the patient including the increased risk of complications for DVT with her history of DVT, also complications from her COPD.  PAST MEDICAL HISTORY:  Drug allergy to PENICILLIN, SULFA, AVELOX, and ROBAXIN.  CURRENT MEDICATIONS: 1. Amiodarone 200 mg b.i.d. 2. Celebrex 200 mg 1 daily. 3. Dulera 100 mg 2 puffs b.i.d. 4. Edecrin 25 mg b.i.d. 5. Spirolactone 25 mg 2 tablets b.i.d. 6. Neurontin 300 mg 1 at bedtime. 7. Pradaxa 20 mg 1 daily. 8. Singulair 10 mg 1 daily. 9. Prilosec 20 mg 1 daily.  SERIOUS MEDICAL ILLNESSES:  Include hypertension, asthma, atrial fibrillation, reflux, chronic bronchitis, rheumatoid arthritis, B12 deficiency, recently DVT of the right leg.  PREVIOUS SURGERIES:  Include bilateral total knee arthroplasty, revision of the right total knee arthroplasty, and subsequent infection with placement of antibiotic spacers x2, cataracts bilaterally, and cardioversion for atrial fibrillation.  FAMILY HISTORY:  Positive for meningitis and congestive heart failure.  SOCIAL HISTORY:  The patient is widowed.  She lives  alone.  She is disabled.  She does not smoke and does not drink.  REVIEW OF SYSTEMS:  CENTRAL NERVOUS SYSTEM:  Negative for headache, blurred vision, or dizziness.  PULMONARY:  Positive for exertional shortness of breath and asthma.  CARDIOVASCULAR:  Positive for occasional irregular heartbeat secondary to atrial fibrillation. Negative for chest pain or palpitation.  She has had previous cardioversion for her atrial fibrillation.  GI:  Negative for ulcers or hepatitis.  Positive for reflux.  GU:  Negative for urinary tract difficulty.  MUSCULOSKELETAL:  Positive as in HPI.  PHYSICAL EXAMINATION:  VITAL SIGNS:  BP  110/70, respirations 18, pulse 78 and regular. GENERAL APPEARANCE:  This is a well-developed, well-nourished lady, in no acute distress. HEENT:  Head normocephalic.  Nose patent.  Ears patent.  Pupils equal, round, and reactive to light.  Throat without injection. NECK:  Supple without adenopathy.  Carotids are 2+ without bruit. CHEST:  Clear to auscultation.  No rales or rhonchi.  Respirations 18. Occasional wheezes that is clear with cough. HEART:  Regular rate and rhythm at 78 beats per minute without murmur. ABDOMEN:  Soft with active bowel sounds.  No masses or organomegaly. NEUROLOGIC:  The patient is alert and oriented to time, place, and person.  Cranial nerves II-XII grossly intact. EXTREMITIES:  Shows the right leg in a knee immobilizer.  Her wounds are well healed.  There is no warmth or redness.  No sign of infection.  She has moderate calf tenderness but no palpable cords.  Neurovascular status intact.  IMPRESSION:  Right knee status post total knee arthroplasty with infection removal and antibiotic spacers x2.  PLAN:  Reimplant total knee arthroplasty, right knee.     [name]. [name], P.A.   ______________________________ Madlyn Frankel Charlann Boxer, M.D.    SJC/MEDQ  D:  08/26/2010  T:  08/27/2010  Job:  [id number]

## 2010-09-08 LAB — CBC
Platelets: 170 10*3/uL (ref 150–400)
RDW: 14.2 % (ref 11.5–15.5)
WBC: 6.5 10*3/uL (ref 4.0–10.5)

## 2010-09-08 LAB — BASIC METABOLIC PANEL
Chloride: 96 mEq/L (ref 96–112)
GFR calc Af Amer: >60 mL/min (ref >60)
Potassium: 4.4 mEq/L (ref 3.5–5.1)

## 2010-09-08 LAB — PROTIME-INR: Prothrombin Time: 13.5 seconds (ref 11.6–15.2)

## 2010-09-09 LAB — BASIC METABOLIC PANEL
CO2: 28 mEq/L (ref 19–32)
Calcium: 8 mg/dL — ABNORMAL LOW (ref 8.4–10.5)
Chloride: 103 mEq/L (ref 96–112)
Sodium: 136 mEq/L (ref 135–145)

## 2010-09-09 LAB — CBC
Platelets: 150 10*3/uL (ref 150–400)
RBC: 2.23 MIL/uL — ABNORMAL LOW (ref 3.87–5.11)
WBC: 4.9 10*3/uL (ref 4.0–10.5)

## 2010-09-09 LAB — PROTIME-INR
INR: 1.42 (ref 0.00–1.49)
Prothrombin Time: 17.6 seconds — ABNORMAL HIGH (ref 11.6–15.2)

## 2010-09-12 NOTE — Op Note (Signed)
[name], [name]          ACCOUNT NO.:  [id number]  MEDICAL RECORD NO.:  [id number]  LOCATION:  0008                         FACILITY:  [facility]  PHYSICIAN:  Madlyn Frankel. Charlann Boxer, M.D.  DATE OF BIRTH:  September 22, 1945  DATE OF PROCEDURE:  09/07/2010 DATE OF DISCHARGE:                              OPERATIVE REPORT   PREOPERATIVE DIAGNOSES: 1. Status post resection arthroplasty for infected total knee     replacement. 2. History of patellectomy.  POSTOPERATIVE DIAGNOSES: 1. Status post resection arthroplasty for infected total knee     replacement. 2. History of patellectomy.  PROCEDURES PERFORMED: 1. Removal of antibiotic spacer. 2. Reimplantation of right total knee, utilizing DePuy fixed bearing     revision system size-3, TC3 femoral component with a 13 x 60 mm     cemented stem, utilizing 8 mm distal medial augment, 16 mm distal     lateral augment, 4 mm medial and lateral posterior augments, tibia     side was a size 2.5 tibial tray with a 13 x 60 mm stem.  A 10 mm     stabilized plus insert.  SURGEON:  Madlyn Frankel. Charlann Boxer, M.D.  ASSISTANT:  Lanney Gins, PA  ANESTHESIA:  General.  COMPLICATIONS:  None.  SPECIMENS:  None as joint fluid was clear.  PREOPERATIVE WORKUP:  Negative for infection.  DRAINS:  One Hemovac.  TOURNIQUET TIME:  70 minutes, 250 mmHg.  BLOOD LOSS:  About 100 mL.  INDICATIONS FOR PROCEDURE:  [name] is a 65 year old patient of mine with history of knee replacements, complicated by infection.  She has had a history of resection arthroplasty with delayed reimplantation due to repeat I&D due to clinical concern for infection as well as poor dentition.  Given all these findings, she is, at this point, now ready to have her knee reimplanted.  Risks and benefits of recurrent infection were discussed and reviewed.  She had fully recovered from her dental extraction.  Consent was obtained for benefit of pain relief with improved  function.  PROCEDURE IN DETAIL:  The patient was brought to the operative theater. Once adequate anesthesia and preoperative antibiotics, 2 g of Ancef administered, and she was positioned supine with the right thigh tourniquet placed.  The right lower extremity was then prepped and draped in sterile fashion.  The right foot was placed in the [facility] leg holder.  Leg was exsanguinated, tourniquet was elevated to 250 mmHg. The patient's old incision was incised sharply.  Soft tissue planes created.  Given the fact, she had no previous patella and slightly paramidline type incision was made through the extensor mechanism. Following initial exposure and removal of cement spacer as the first portion of the case debrided the ends of bone as well as soft tissues recreating medial and lateral gutters, and suprapatellar pouch.  The knee flexed at this point, I reamed the femoral and tibial canal to 15 mm by both sides.  Looked on the tibia side first, evaluating the remaining tibial bone stock, which was a large metaphyseal void with size-2.5 tibial tray sitting on top of the tibial and it was perfectly aligned to this point. Given these findings, I chose to use 2.5 tibial tray and  basically cut the trial component, placed and attended to the femur based on the fact it was perpendicular in coronal plane.  Femoral preparation, intramedullary rod was passed into the canal and 24 mm aperture cylinder was placed to keep the rod and placed.  What I meant to be doing, distally was removed and just a little bit of bone on the far medial and distal femur.  Based on previous use of 4 mm distal, medial, and lateral augments, I held a trial up and felt that I was going to need medial and lateral augments, so at this point I chose to use an 8 mm and 16 mm augment to restored the joint line length.  Because I was using the same size component that was previously placed with a siz-3, I did end up  removing some posterior bone, very little bone and soft tissue of the posterior aspect and used 4 mm augments posterior, medial, and lateral in order to keep set rotation.  The rotation had been set, based off the proximal tibial tray which was known to be perpendicular in coronal plane.  Trial femur was then configured with a size-3 TC3 component, +2 bolt, 5-degree bolt, +2 adapter with a 60 mm stem.  Trial reduction was carried out with these components in place, another 10 mm insert of the knee came to full extension with stable medially and laterally with good extension to flexion balance.  Given these findings, the trial components removed further preparation of the femur and tibial carried out with Pulse Lavage irrigation using a pulse canal irrigator. Cement restrictor placed in the proximal or distal femur and proximal tibia at appropriate depth.  Final components were opened and configured on the back table, undermine the direction.  At this point, four batches of gentamicin latents, antibiotic cement was then mixed, final components were cemented into place, knee was brought to extension with a 10 mm insert until the cement fully cured.  Extruded cement was removed.  Once cement fully cured, an excessive cement was removed throughout the knee.  Final 10 mm insert was chosen with a posterior- stabilized plus insert.  It was impacted into position.  At this point, the tourniquet had been let down at 70 minutes without significant hemostasis required.  I irrigated the knee with normal saline solution, Pulse Lavage again.  Scarred synovial layer was injected with 0.25% Marcaine with epinephrine and 1 mL of Toradol.  At this point, the extensor mechanism was reapproximated using a one Vicryl and the remaining wound was closed with 2-0 Vicryl, running 4-0 Monocryl.  The knee was cleaned and dried, and dressed sterilely using Xeroform and bulky sterile wrap.  She was then brought to  the recovery room, extubated in stable condition, and tolerating the procedure well.     Madlyn Frankel Charlann Boxer, M.D.     MDO/MEDQ  D:  09/07/2010  T:  09/07/2010  Job:  [id number]  Electronically Signed by Durene Romans M.D. on 09/12/2010 09:16:09 AM

## 2010-09-12 NOTE — Discharge Summary (Signed)
[name], [name]          ACCOUNT NO.:  [id number]  MEDICAL RECORD NO.:  [id number]  LOCATION:  1526                         FACILITY:  [facility]  PHYSICIAN:  Madlyn Frankel. Charlann Boxer, M.D.  DATE OF BIRTH:  12/20/1945  DATE OF ADMISSION:  06/23/2010 DATE OF DISCHARGE:  07/01/2010                              DISCHARGE SUMMARY   ADMITTING DIAGNOSIS:  Status post infected left total knee replacement with removal of previous antibiotic spacer.  DISCHARGE DIAGNOSES: 1. 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 2. Significant tooth decay and infection. 3. Hypertension. 4. Asthma. 5. Atrial fibrillation. 6. Reflux. 7. Chronic bronchitis. 8. Rheumatoid arthritis. 9. Vitamin B12 deficiency.  BRIEF HISTORY:  Ms. [name] is a 65 year old female with history of left total knee replacement with subsequent infection, removal of antibiotic and antibiotic spacer.  She had been on IV antibiotics and had been doing well.  She presented to the office for re-implantation and history and physical set.  Surgery was scheduled for May 15.  HOSPITAL COURSE:  The patient admitted for same-day surgery on Jun 23, 2010.  At the time of her initial evaluation prior to going back to the operating room, there was noted significant tooth decay and concerns for oral infection.  This was taken into consideration as we made a decision clinically intraoperatively.  Please see operative note for full details of the procedure.  Bottom line is that at the time of surgery, she was noted to have some clinical concern for infection in addition to her oral hygiene.  I decided not to reimplant her knee replacement.  Her knee was re-incised and drained and antibiotic spacer placed.  She was subsequently transferred to recovery room.  After her routine stay in the recovery room, she was transferred to the ward where she remained for whole hospital stay.  Based on her previous experience of being in a  nursing facility for a period time prior to this, the plan was for her to go to home with IV PICC line for another 4 weeks with home health physical therapy.  On postoperative day 1, she was stable with labs remained normal.  She did not require transfusion.  I did get a dental consult while she was in the hospital.  She remained stable through the weekend without complicating features. I did have Dr. Kristin Bruins see her for surgery.  On Jun 26, 2010, he took her to the operating room for lower teeth extraction.  On postop day 2, she did have a hematocrit that was stable, again not requiring any transfusion.  Her Hemovac drain was removed.  Her right knee wound was clean, dry, and intact.  She had been seen and evaluated by Physical Therapy.  On day 3, she was taken back to the operating room for tooth extraction.  Please see dictated notes from his procedure note.  She subsequently remained in the hospital until May 23.  There were no complicating symptoms.  Arrangements were made for discharge to home with IV antibiotics.DISCHARGE INSTRUCTIONS:  The patient will be discharged to home with home health physical therapy on Jul 01, 2010.  She will be on IV Kefzol 2 g every 8 hours, Ventolin inhaler as needed in  addition to home medicines of amiodarone 100 mg b.i.d., Dulera 100/5 mcg inhalers b.i.d., Edecrin 25 mg 4 tablets daily, gabapentin 300 mg daily, Pradaxa 150 mg b.i.d., Prevacid 30 mg daily, Singulair 10 mg nightly, spironolactone 25 mg b.i.d. and Ventolin inhaler.  In addition, she should be on hydrocodone 7.5/325 one to two tablets every 4 hours as needed for pain, Robaxin 500 mg every 6 hours as needed for muscle spasm and pain, Colace 100 mg p.o. b.i.d. and MiraLax 17 g p.o. daily as needed for constipation.  She will return to see me, Dr. Durene Romans, at [facility] at [phone].  She will keep her wound dry until followup and staple was removed.  Questions were  encouraged and answers reviewed.     Madlyn Frankel Charlann Boxer, M.D.     MDO/MEDQ  D:  09/07/2010  T:  09/07/2010  Job:  [id number]  Electronically Signed by Durene Romans M.D. on 09/12/2010 09:16:02 AM

## 2010-09-21 NOTE — Discharge Summary (Signed)
NAMEKOLETTE, VEY          ACCOUNT NO.:  [id number]  MEDICAL RECORD NO.:  [id number]  LOCATION:  1601                         FACILITY:  [facility]  PHYSICIAN:  Madlyn Frankel. Charlann Boxer, M.D.  DATE OF BIRTH:  Jul 19, 1945  DATE OF ADMISSION:  09/07/2010 DATE OF DISCHARGE:  09/10/2010                        DISCHARGE SUMMARY - REFERRING   PROCEDURE:  Right total knee arthroplasty revision.  ATTENDING PHYSICIAN:  Madlyn Frankel. Charlann Boxer, MD  HISTORY OF PRESENT ILLNESS:  The patient is a 65 year old lady with a history of total knee arthroplasty with resection.  The arthroplasty had to be removed and antibiotic spacer was placed.  On subsequent removal of the spacer, she still had sinus infection and subsequently had another antibiotic spacer implanted.  Thereafter, when she was ready for repeat reimplantation, she was noted to have a DVT and has been undergoing treatment for that with Coumadin.  Once it has resolved, the patient returned to clinic, other options were discussed and the patient wishes to proceed with surgery.  Risks, benefits, and expectations of procedure were discussed with the patient.  The patient understands the risks, benefits, and expectations and wishes to proceed with right total knee arthroplasty revision after removal of the antibiotic spacers.  HOSPITAL COURSE:  The patient underwent the above-stated procedure and tolerated the procedure well, and was brought to the recovery room in good condition, and subsequently to the floor.  Postoperatively day #1, September 08, 2010, the patient was doing well without incident, states her pain has been well controlled.  She is afebrile, vital signs stable. Hemoglobin was 9.3 and hematocrit 26.5.  She is distally and neurovascularly intact and started physical therapy on postop day #2, September 09, 2010.  The patient is doing well again with no incident.  The patient is complaining of only minimal pain but still well controlled with the  pain medicine.  She is afebrile, vital signs stable. Hemoglobin is 8.2 and hematocrit is 23.7.  She is distally and neurovascularly intact.  The dressing is dry, clean, and intact.  The patient is going to do physical therapy.  On postop day #3, September 10, 2010, the patient is doing really well, no incidents.  She is ready to be discharged.  Per the patient, she is only complaining of minimal pain.  Dressing was taken down at wound inspection.  It was clean, dry and intact.  Dressing was replaced with gauze and tape.  The patient is still afebrile, vital signs stable, and distally neurovascularly intact. She does feel like there is some weakness in her leg, but this is from moving it.  She feels like she is progressing daily with physical therapy.  She feels well enough to be discharged to a skilled nursing facility today.  DISCHARGE CONDITION:  Good.  DISCHARGE INSTRUCTIONS:  The patient will be discharged to a skilled nursing facility on September 10, 2010.  The patient is to keep her incision dry and clean until she follows up.  Dressing can be replaced with tape and gauze as needed.  The patient will follow up with the [facility] with Dr. Charlann Boxer in 2 weeks.  The patient is to be weight bearing as tolerated.  She knows to call if she  has any concerns or questions.  DISCHARGE MEDICATIONS: 1. Benadryl 25 mg 1 p.o. q.4 h. p.r.n. 2. Colace 100 mg 1 p.o. b.i.d. p.r.n. constipation. 3. Lovenox 40 mg 1 subcutaneously daily, we used to bridge until INR     came in up into a therapeutic range, should maintain INR of 2-3. 4. Iron sulfate 325 mg 1 p.o. t.i.d. times 2-3 weeks. 5. MiraLax 17 g 1 p.o. daily p.r.n. constipation. 6. Oxycodone 5 mg 1-3 p.o. q.4-6 h. p.r.n. pain. 7. Coumadin 5 mg 1 p.o. daily.  This has been adjusted to maintain INR     of 2-3. 8. Amiodarone 200 mg 1 p.o. b.i.d. 9. Dulera 100/5 mcg 2 puffs inhale b.i.d. as needed. 10.Edecrin 25 mg 2 p.o. daily. 11.Robaxin  500 mg 1 p.o. q.6 h. p.r.n. muscle spasms. 12.Gabapentin 300 mg 1 p.o. q.h.s. 13.Prilosec 20 mg 1 p.o. q.h.s. 14.Saline nasal spray 2 sprays nasally q.h.s. p.r.n. dry nose. 15.Singulair 10 mg 1 p.o. daily. 16.Spironolactone 25 mg 2 p.o. q.h.s.    ______________________________ Lanney Gins, PA   ______________________________ Madlyn Frankel. Charlann Boxer, M.D.    MB/MEDQ  D:  09/10/2010  T:  09/10/2010  Job:  [id number]  Electronically Signed by Lanney Gins PA on 09/17/2010 11:38:32 AM Electronically Signed by Durene Romans M.D. on 09/21/2010 07:33:44 AM

## 2010-10-13 ENCOUNTER — Inpatient Hospital Stay (HOSPITAL_COMMUNITY)
Admission: AD | Admit: 2010-10-13 | Discharge: 2010-10-15 | DRG: 560 | Disposition: A | Discharge status: Home Health | Payer: Medicare Other | Source: Ambulatory Visit | Attending: Orthopedic Surgery | Admitting: Orthopedic Surgery

## 2010-10-13 DIAGNOSIS — Z883 Allergy status to other anti-infective agents status: Secondary | ICD-10-CM

## 2010-10-13 DIAGNOSIS — Z86718 Personal history of other venous thrombosis and embolism: Secondary | ICD-10-CM

## 2010-10-13 DIAGNOSIS — L02419 Cutaneous abscess of limb, unspecified: Secondary | ICD-10-CM | POA: Diagnosis present

## 2010-10-13 DIAGNOSIS — Y831 Surgical operation with implant of artificial internal device as the cause of abnormal reaction of the patient, or of later complication, without mention of misadventure at the time of the procedure: Secondary | ICD-10-CM | POA: Diagnosis present

## 2010-10-13 DIAGNOSIS — K219 Gastro-esophageal reflux disease without esophagitis: Secondary | ICD-10-CM | POA: Diagnosis present

## 2010-10-13 DIAGNOSIS — T8450XA Infection and inflammatory reaction due to unspecified internal joint prosthesis, initial encounter: Principal | ICD-10-CM | POA: Diagnosis present

## 2010-10-13 DIAGNOSIS — M79609 Pain in unspecified limb: Secondary | ICD-10-CM

## 2010-10-13 DIAGNOSIS — Z79899 Other long term (current) drug therapy: Secondary | ICD-10-CM

## 2010-10-13 DIAGNOSIS — M069 Rheumatoid arthritis, unspecified: Secondary | ICD-10-CM | POA: Diagnosis present

## 2010-10-13 DIAGNOSIS — L03119 Cellulitis of unspecified part of limb: Secondary | ICD-10-CM | POA: Diagnosis present

## 2010-10-13 DIAGNOSIS — Z96659 Presence of unspecified artificial knee joint: Secondary | ICD-10-CM

## 2010-10-13 DIAGNOSIS — J45909 Unspecified asthma, uncomplicated: Secondary | ICD-10-CM | POA: Diagnosis present

## 2010-10-13 DIAGNOSIS — Z880 Allergy status to penicillin: Secondary | ICD-10-CM

## 2010-10-13 DIAGNOSIS — I10 Essential (primary) hypertension: Secondary | ICD-10-CM | POA: Diagnosis present

## 2010-10-13 DIAGNOSIS — I4891 Unspecified atrial fibrillation: Secondary | ICD-10-CM | POA: Diagnosis present

## 2010-10-13 LAB — COMPREHENSIVE METABOLIC PANEL
ALT: 6 U/L (ref 0–35)
Alkaline Phosphatase: 79 U/L (ref 39–117)
BUN: 6 mg/dL (ref 6–23)
CO2: 28 mEq/L (ref 19–32)
Chloride: 101 mEq/L (ref 96–112)
GFR calc Af Amer: >60 mL/min (ref >60)
GFR calc non Af Amer: >60 mL/min (ref >60)
Glucose, Bld: 81 mg/dL (ref 70–99)
Potassium: 3.5 mEq/L (ref 3.5–5.1)
Sodium: 136 mEq/L (ref 135–145)
Total Bilirubin: 0.2 mg/dL — ABNORMAL LOW (ref 0.3–1.2)
Total Protein: 5 g/dL — ABNORMAL LOW (ref 6.0–8.3)

## 2010-10-13 LAB — CBC
HCT: 33.8 % — ABNORMAL LOW (ref 36.0–46.0)
Hemoglobin: 11.5 g/dL — ABNORMAL LOW (ref 12.0–15.0)
MCH: 37.8 pg — ABNORMAL HIGH (ref 26.0–34.0)
MCHC: 34 g/dL (ref 30.0–36.0)
MCV: 111.2 fL — ABNORMAL HIGH (ref 78.0–100.0)
RBC: 3.04 MIL/uL — ABNORMAL LOW (ref 3.87–5.11)

## 2010-10-13 LAB — DIFFERENTIAL
Lymphs Abs: 0.8 10*3/uL (ref 0.7–4.0)
Monocytes Absolute: 0.6 10*3/uL (ref 0.1–1.0)
Monocytes Relative: 14 % — ABNORMAL HIGH (ref 3–12)
Neutro Abs: 2.8 10*3/uL (ref 1.7–7.7)
Neutrophils Relative %: 65 % (ref 43–77)

## 2010-10-14 LAB — VANCOMYCIN, TROUGH: Vancomycin Tr: 15.7 ug/mL (ref 10.0–20.0)

## 2010-10-23 NOTE — Discharge Summary (Signed)
[name], [name]          ACCOUNT NO.:  [id number]  MEDICAL RECORD NO.:  [id number]  LOCATION:  1611                         FACILITY:  [facility]  PHYSICIAN:  Madlyn Frankel. Charlann Boxer, M.D.  DATE OF BIRTH:  18-Jul-1945  DATE OF ADMISSION:  10/13/2010 DATE OF DISCHARGE:  10/15/2010                              DISCHARGE SUMMARY   ADMITTING DIAGNOSIS:  Question infected revision of total knee arthroplasty.  DISCHARGE DIAGNOSES: 1. Intravenous antibiotics for questionable infected revision of total     knee arthroplasty. 2. Hypertension. 3. Asthma. 4. Atrial fibrillation. 5. Reflux. 6. Rheumatoid arthritis. 7. Deep vein thrombosis.  HISTORY OF PRESENT ILLNESS:  The patient is a 65 year old lady with a history of total knee arthroplasty with subsequent infection with removal of implant, antibiotic spacer placement, and antibiotics given with subsequent reimplantation.  She is now 65-weeks out from surgery. She has 5 weeks of antibiotic use at home.  Her antibiotics finished up last week.  The patient started having chills and malaise without fever on Saturday and she also did notice some redness and swelling of her knee.  Because she was feeling sick and the condition of her knee she presented to the clinic for evaluation where she was seen by Jodene Nam, PAC.  On presentation to the office, she was afebrile with temperature of 97.8.  She did have trace effusion with warmth of the knee and with her condition of not feeling well with chills and malaise I consulted with Dr. Charlann Boxer who admitted the patient to the hospital to receive IV antibiotics.  HOSPITAL COURSE:  The patient was admitted to the hospital on 10/13/2010.  The patient started receiving vancomycin.  The patient by 10/15/2010 was feeling significantly better on vancomycin.  She received a PICC line so that she may continue receiving this for 4 additional weeks at home.  It was felt the patient was doing well and the  patient's knee was looking good to be discharged home.  DISCHARGE CONDITION:  Good.  DISCHARGE INSTRUCTIONS:  The patient will be discharged home on the PICC line.  She will receive 1 g of vancomycin IV q.day, this will be given by home health RN, will monitor weekly troughs and adjust it.  The patient will follow up with Dr. Charlann Boxer in 4 weeks.  The patient is to call with any questions or concerns.  DISCHARGE MEDICATIONS: 1. Norco 5/325 one to two p.o. q. 4-6 h. p.r.n. pain. 2. Robaxin 500 mg 1 p.o. q. 6 h. p.r.n. muscle spasms. 3. Vancomycin 1 g IV q. day. x4 weeks given and monitored by home     health RN. 4. Amiodarone 200 mg 1 p.o. b.i.d. 5. Benadryl 25 mg p.o. q.4 h. p.r.n. 6. Celebrex 200 mg 1 p.o. q.day. 7. Colace 100 mg 1 p.o. b.i.d. constipation. 8. Dulera 100/5 mcg 2 puffs b.i.d. p.r.n. 9. Gabapentin 300 mg 1 p.o. b.i.d. 10.Percocet 1 p.o. q.h.s. 11.Saline nasal spray 2 sprays nasally q.h.s. p.r.n. nasal congestion. 12.Singulair 10 mg 1 p.o. q.day. 13.Spironolactone 25 mg 2 p.o. q.h.s. 14.Warfarin 2.5 mg 1 p.o. q.h.s.    ______________________________ Lanney Gins, PA   ______________________________ Madlyn Frankel. Charlann Boxer, M.D.    MB/MEDQ  D:  10/15/2010  T:  10/15/2010  Job:  [id number]  Electronically Signed by Lanney Gins PA on 10/20/2010 03:58:37 PM Electronically Signed by Durene Romans M.D. on 10/23/2010 07:25:15 AM

## 2010-10-23 NOTE — H&P (Signed)
[name], CLAIR          ACCOUNT NO.:  [id number]  MEDICAL RECORD NO.:  [id number]  LOCATION:  1611                         FACILITY:  [facility]  PHYSICIAN:  [name]. [name], M.D.  DATE OF BIRTH:  03-Dec-1945  DATE OF ADMISSION:  10/13/2010 DATE OF DISCHARGE:                             HISTORY & PHYSICAL   ADMITTING DIAGNOSIS:  Question infected revision total knee arthroplasty.  HISTORY OF PRESENT ILLNESS:  This is a 65 year old lady with a history of a total knee arthroplasty with subsequent infection, removal of implant, antibiotic spacer and antibiotics, and subsequent replantation. She is now 5 weeks out from her surgery.  She has had 5 weeks of high antibiotics at home.  Her antibiotics finished up late last week and Saturday, the patient started having chills and malaise, no fevers.  She did notice redness and swelling of her knee and because of her feeling sick, her chills, and the redness and swelling of her knee and distal thigh, she came in today for evaluation.  Today in the office, she was noted to have a trace effusion, warmth of the knee, afebrile at 97.8, though obviously in somewhat distress with her feeling sick.  At this time, she did not have enough fluid in the knee to aspirate, but with the warmth, redness, malaise, and chills, I called and discussed the patient with Dr. Charlann [name] and she will be admitted to the hospital.  At this time, we will start IV antibiotics in the form of vancomycin.  Obtained stat labs also with her tenderness in her calf and medial thigh around the knee with a history of DVT.  Despite the fact she has been on Coumadin, we will get Dopplers to rule out DVT and she may need eventual I and D.  We discussed this with the patient and she is agreeable with the plan.  She is admitted at this time.  DRUG ALLERGY: 1. PENICILLIN. 2. SULFA. 3. AVELOX. 4. ROBAXIN.  CURRENT MEDICATIONS: 1. Amiodarone 200 mg b.i.d. 2. Celebrex 200 mg  daily. 3. Dulera 100 mg two puffs b.i.d. 4. Neurontin 300 mg q.h.s. 5. Singular 10 mg daily. 6. Prilosec 20 mg daily. 7. Lasix 20 mg daily.  PREVIOUS SURGERIES: 1. Left total knee arthroplasty. 2. Right total knee arthroplasty with subsequent removal of implant,     antibiotic spacer, and then revision of total knee arthroplasty. 3. Cataracts bilaterally. 4. Cardioversion for AFib.  SERIOUS MEDICAL ILLNESS:  Include 1. Hypertension. 2. Asthma. 3. AFib. 4. Reflux. 5. Rheumatoid arthritis. 6. DVT.  FAMILY HISTORY:  Positive for CHF and meningitis.  SOCIAL HISTORY:  The patient is a widow.  She lives alone.  She is disabled.  She does not smoke or drink.  REVIEW OF SYSTEMS:  CENTRAL NERVOUS SYSTEM:  Negative for headache, blurry vision, or dizziness.  PULMONARY:  Positive for exertional shortness of breath.  Negative for PND or orthopnea.  CARDIOVASCULAR: Positive for history of atrial fib.  Negative for chest pain or palpitation.  GI:  Positive for reflux.  Negative for ulcers.  GU: Negative for urinary tract difficulty.  MUSCULOSKELETAL:  Positive as in the HPI.  PHYSICAL EXAMINATION:  VITAL SIGNS:  BP 108/60, pulse 68, respirations  16, temperature 97.8. HEENT:  Head normocephalic.  Nose patent.  Ears patent.  Pupils are equal, round, and reactive to light.  Throat without injection. NECK:  Supple without adenopathy.  Carotids 2+ without bruits. CHEST:  Clear to auscultation.  No rales or rhonchi.  Respirations are 16. HEART:  Regular rate and rhythm at 68 beats per minute without murmur. ABDOMEN:  Soft.  Active bowel sounds.  No masses or organomegaly. NEUROLOGIC:  The patient is alert and oriented to time, place, and person.  Cranial nerves II through XII grossly intact. EXTREMITIES:  Show the left knee status post total knee arthroplasty with no redness, swelling, or warmth.  The right knee shows warmth to the knee, redness to the medial aspect of the knee, trace  effusion, tender in the calf and medial thigh, no palpable cords, but hard brawny edema. NEUROVASCULAR:  Status intact.  X-rays in the office revealed the prosthesis to be in good position without evidence of loosening or complications.  ASSESSMENT:  Question infection total knee arthroplasty.  PLAN:  Admit.  Vancomycin per pharmacy protocol.  Stat CBC with diff, sed rate, CRP, also Dopplers to rule out DVT.  We will keep her n.p.o. for now until seen by Dr. Charlann [name] for possible I and D.     [name]. Chabon, P.A.   ______________________________ [name] [name], M.D.   SJC/MEDQ  D:  10/13/2010  T:  10/14/2010  Job:  [id number]  Electronically Signed by [name] P.A. on 10/20/2010 08:57:28 AM Electronically Signed by [name] M.D. on 10/23/2010 07:25:11 AM

## 2010-11-02 ENCOUNTER — Encounter (HOSPITAL_COMMUNITY): Payer: Self-pay | Admitting: Dentistry

## 2010-11-02 DIAGNOSIS — K08109 Complete loss of teeth, unspecified cause, unspecified class: Secondary | ICD-10-CM

## 2010-11-24 ENCOUNTER — Encounter (HOSPITAL_COMMUNITY): Payer: Self-pay | Admitting: Dentistry

## 2010-11-24 DIAGNOSIS — K08109 Complete loss of teeth, unspecified cause, unspecified class: Secondary | ICD-10-CM

## 2010-11-24 DIAGNOSIS — M2607 Excessive tuberosity of jaw: Secondary | ICD-10-CM

## 2010-12-02 ENCOUNTER — Encounter (HOSPITAL_COMMUNITY): Payer: Self-pay | Admitting: Dentistry

## 2010-12-02 DIAGNOSIS — K08109 Complete loss of teeth, unspecified cause, unspecified class: Secondary | ICD-10-CM

## 2010-12-02 DIAGNOSIS — M264 Malocclusion, unspecified: Secondary | ICD-10-CM

## 2010-12-08 ENCOUNTER — Inpatient Hospital Stay (HOSPITAL_COMMUNITY)
Admission: RE | Admit: 2010-12-08 | Discharge: 2010-12-11 | DRG: 501 | Disposition: A | Discharge status: Home Health | Payer: Medicare Other | Source: Ambulatory Visit | Attending: Orthopedic Surgery | Admitting: Orthopedic Surgery

## 2010-12-08 ENCOUNTER — Inpatient Hospital Stay (HOSPITAL_COMMUNITY): Admit: 2010-12-08 | Payer: Self-pay | Admitting: Orthopedic Surgery

## 2010-12-08 DIAGNOSIS — Z880 Allergy status to penicillin: Secondary | ICD-10-CM

## 2010-12-08 DIAGNOSIS — Z79899 Other long term (current) drug therapy: Secondary | ICD-10-CM

## 2010-12-08 DIAGNOSIS — I10 Essential (primary) hypertension: Secondary | ICD-10-CM | POA: Diagnosis present

## 2010-12-08 DIAGNOSIS — Z86718 Personal history of other venous thrombosis and embolism: Secondary | ICD-10-CM

## 2010-12-08 DIAGNOSIS — M25069 Hemarthrosis, unspecified knee: Secondary | ICD-10-CM | POA: Diagnosis present

## 2010-12-08 DIAGNOSIS — I509 Heart failure, unspecified: Secondary | ICD-10-CM | POA: Diagnosis present

## 2010-12-08 DIAGNOSIS — K219 Gastro-esophageal reflux disease without esophagitis: Secondary | ICD-10-CM | POA: Diagnosis present

## 2010-12-08 DIAGNOSIS — E538 Deficiency of other specified B group vitamins: Secondary | ICD-10-CM | POA: Diagnosis present

## 2010-12-08 DIAGNOSIS — I4891 Unspecified atrial fibrillation: Secondary | ICD-10-CM | POA: Diagnosis present

## 2010-12-08 DIAGNOSIS — Z96659 Presence of unspecified artificial knee joint: Secondary | ICD-10-CM

## 2010-12-08 DIAGNOSIS — M4120 Other idiopathic scoliosis, site unspecified: Secondary | ICD-10-CM | POA: Diagnosis present

## 2010-12-08 DIAGNOSIS — M66259 Spontaneous rupture of extensor tendons, unspecified thigh: Principal | ICD-10-CM | POA: Diagnosis present

## 2010-12-08 DIAGNOSIS — Z8673 Personal history of transient ischemic attack (TIA), and cerebral infarction without residual deficits: Secondary | ICD-10-CM

## 2010-12-08 DIAGNOSIS — J449 Chronic obstructive pulmonary disease, unspecified: Secondary | ICD-10-CM | POA: Diagnosis present

## 2010-12-08 DIAGNOSIS — J4489 Other specified chronic obstructive pulmonary disease: Secondary | ICD-10-CM | POA: Diagnosis present

## 2010-12-08 DIAGNOSIS — M069 Rheumatoid arthritis, unspecified: Secondary | ICD-10-CM | POA: Diagnosis present

## 2010-12-08 DIAGNOSIS — Z7901 Long term (current) use of anticoagulants: Secondary | ICD-10-CM

## 2010-12-08 HISTORY — PX: OTHER SURGICAL HISTORY: SHX169

## 2010-12-08 LAB — URINALYSIS, ROUTINE W REFLEX MICROSCOPIC
Glucose, UA: NEGATIVE mg/dL
Leukocytes, UA: NEGATIVE
Protein, ur: NEGATIVE mg/dL
Urobilinogen, UA: 0.2 mg/dL (ref 0.0–1.0)

## 2010-12-08 LAB — BASIC METABOLIC PANEL
CO2: 26 mEq/L (ref 19–32)
Calcium: 9.4 mg/dL (ref 8.4–10.5)
Creatinine, Ser: 0.55 mg/dL (ref 0.50–1.10)
Glucose, Bld: 96 mg/dL (ref 70–99)

## 2010-12-08 LAB — DIFFERENTIAL
Basophils Relative: 0 % (ref 0–1)
Lymphocytes Relative: 16 % (ref 12–46)
Monocytes Relative: 12 % (ref 3–12)
Neutro Abs: 2.7 10*3/uL (ref 1.7–7.7)
Neutrophils Relative %: 71 % (ref 43–77)

## 2010-12-08 LAB — CBC
HCT: 37.4 % (ref 36.0–46.0)
Hemoglobin: 13.5 g/dL (ref 12.0–15.0)
MCH: 38.4 pg — ABNORMAL HIGH (ref 26.0–34.0)
RBC: 3.52 MIL/uL — ABNORMAL LOW (ref 3.87–5.11)

## 2010-12-08 LAB — URINE MICROSCOPIC-ADD ON

## 2010-12-08 LAB — PROTIME-INR
INR: 1.93 — ABNORMAL HIGH (ref 0.00–1.49)
Prothrombin Time: 22.4 seconds — ABNORMAL HIGH (ref 11.6–15.2)

## 2010-12-09 ENCOUNTER — Inpatient Hospital Stay (HOSPITAL_COMMUNITY): Payer: Medicare Other

## 2010-12-09 LAB — CBC
HCT: 30.2 % — ABNORMAL LOW (ref 36.0–46.0)
Hemoglobin: 10.4 g/dL — ABNORMAL LOW (ref 12.0–15.0)
MCH: 37.8 pg — ABNORMAL HIGH (ref 26.0–34.0)
MCHC: 34.4 g/dL (ref 30.0–36.0)

## 2010-12-09 LAB — PROTIME-INR: INR: 1.83 — ABNORMAL HIGH (ref 0.00–1.49)

## 2010-12-09 LAB — BASIC METABOLIC PANEL
BUN: 7 mg/dL (ref 6–23)
CO2: 28 mEq/L (ref 19–32)
Chloride: 95 mEq/L — ABNORMAL LOW (ref 96–112)
Creatinine, Ser: 0.55 mg/dL (ref 0.50–1.10)

## 2010-12-10 LAB — BASIC METABOLIC PANEL
BUN: 8 mg/dL (ref 6–23)
CO2: 27 mEq/L (ref 19–32)
Chloride: 99 mEq/L (ref 96–112)
Creatinine, Ser: 0.7 mg/dL (ref 0.50–1.10)
Glucose, Bld: 109 mg/dL — ABNORMAL HIGH (ref 70–99)

## 2010-12-10 LAB — CBC
HCT: 30.2 % — ABNORMAL LOW (ref 36.0–46.0)
MCV: 111.9 fL — ABNORMAL HIGH (ref 78.0–100.0)
RDW: 13.1 % (ref 11.5–15.5)
WBC: 5.1 10*3/uL (ref 4.0–10.5)

## 2010-12-10 NOTE — Op Note (Signed)
NAMEVAISHNAVI, DALBY          ACCOUNT NO.:  [id number]  MEDICAL RECORD NO.:  [id number]  LOCATION:  1620                         FACILITY:  [facility]  PHYSICIAN:  Madlyn Frankel. Charlann Boxer, M.D.  DATE OF BIRTH:  [birth year]  DATE OF PROCEDURE:  12/08/2010 DATE OF DISCHARGE:                              OPERATIVE REPORT   PREOPERATIVE DIAGNOSES:  Right extensor mechanism rupture/dehiscence from previous multiple surgical procedure on the right knee and remote history of patellectomy.  POSTOPERATIVE DIAGNOSES:  Right extensor mechanism rupture/dehiscence from previous multiple surgical procedure on the right knee and remote history of patellectomy.  PROCEDURE:  Open repair of right quadriceps tendon with primary reapproximation with augmentation TissueMend allograft.  SURGEON:  Madlyn Frankel. Charlann Boxer, M.D.  ASSISTANT:  Lanney Gins, PA  Lanney Gins, physician assistant, was present for the entirety of the case, utilized for preoperative positioning, perioperative retractor management, and mobilization soft tissues as well as primary wound closure.  ANESTHESIA:  General.  SPECIMENS:  None.  COMPLICATION:  None.  FINDINGS:  There was no sign of infection.  There was hemarthrosis inside the knee joint.  Tissue itself was abnormal in appearance, but it had been that way at the other times in the operating room.  TOURNIQUET TIME:  52 minutes at 250 mmHg.  BLOOD LOSS:  Minimal.  INDICATIONS FOR PROCEDURE:  Ms. Burling is a 65 year old female, who has been a patient of mine for some time.  She had a remote history of patellectomy and history of a total knee replacement that had failed, I had revised her, at that time, there was clinical concern for infection. However, she did present postoperatively with infection.  She had subsequently undergone a resection and reimplantation.  She had undergone a reimplantation and had been doing well with no signs of infection, but had  presented to the office with inability to actively extend her leg with a relatively new onset.  She had obvious seromatous tissue in this joint area.  Given this inability for active knee extension and her fluid findings, it was my recommendation for her to consider surgical intervention.  After reviewing with her the risks and benefits of operative versus nonoperative treatments, at this point, she wished to proceed in this fashion.  Consent was obtained after reviewing risks of infection, nonhealing of this tendon.  I briefly touched on other further option including knee fusion versus amputation.  PROCEDURE IN DETAIL:  The patient was brought into the operative theater.  Once adequate anesthesia, antibiotics, clindamycin administered, she was positioned supine.  The right thigh tourniquet was placed.  The right lower extremity was then prepped and draped in a sterile fashion.  Time-out was performed identifying the patient, planned procedure, and extremity.  Leg was exsanguinated, tourniquet elevated to 250 mmHg.  Midline incision made through previous incision.  Soft tissue planes created. Few things were happened.  Number 1, immediate evidence of seromatous hemarthrosis was identified and evacuated.  There were no signs of infection.  Also, what was evident was the fact that her extensor mechanism was rather adherent to the subcutaneous tissue.  At this point, I spent time to dissect this plane between her skin and extensor mechanism.  Once I had  freed this up as best as I could, then mobilized the soft tissues, I irrigated the knee with normal saline solution, approximately 1500.  Once this was done, I began reapproximating tissues from a proximal to distal area using #1 Vicryl.  With further soft tissue immobilization, I was able to get the extensor mechanism to be reapproximated and noted the dehiscence of rupture was more vertically oriented, most likely in the previous our  suture line.  This tendon was then reapproximated from a proximal distant fashion, using #1 Vicryl.  I was able to get the tendon back to itself, albeit, with some tension.  I then made a decision to use a 5 x 5 cm TissueMend allograft which was then sutured in over top of this to provide augmentation to this tendon as this was the weakest segment in the distal right over the anterior aspect knee in a location of the previous patellectomy.  Following this, I re-irrigated the knee out with another 500 cc of normal saline solution, reapproximated the subcu layer with 2-0 Vicryl, and staples on the skin.  Skin was cleaned, dried, and dressed sterilely using Xeroform and a bulky wrap.  She was placed in knee immobilizer.  PLAN:  Plan for her to be weight bear as tolerated, but we will have her fit TROM extension brace to be locked in extension to prevent any flexion.  She will need to remain as stable as possible while to this allow extensor mechanism chance to heal over 6-week period of time. Following this, we will work on mobilization, she may get motion with some pain; however, the importance of the full knee extension for activity level outweighs flexion at this point, from my standpoint.     Madlyn Frankel Charlann Boxer, M.D.     MDO/MEDQ  D:  12/08/2010  T:  12/09/2010  Job:  [id number]  Electronically Signed by Durene Romans M.D. on 12/10/2010 11:18:36 AM

## 2010-12-10 NOTE — H&P (Signed)
[name], [name]          ACCOUNT NO.:  [id number]  MEDICAL RECORD NO.:  [id number]  LOCATION:                               FACILITY:  [facility]  PHYSICIAN:  Madlyn Frankel. Charlann Boxer, M.D.  DATE OF BIRTH:  24-Feb-1945  DATE OF ADMISSION: DATE OF DISCHARGE:                             HISTORY & PHYSICAL   CHIEF COMPLAINT:  Right knee pain and inability to extend her leg, status post right total knee arthroplasty, subsequent antibiotic spacer, and subsequent revision.  HISTORY OF PRESENT ILLNESS:  Patient is a 65 year old lady with history of total knee arthroplasty with infection, who had the prosthetic removed, and antibiotic spacer placed.  On subsequent removal of the spacers, she had signs of infection and additional antibiotic spacer was implanted.  Thereafter, the patient had a revision of the right total knee.  The patient has been doing well since that time, but has had an inability to extend her legs due to concerns with a possibility of a quadriceps tendon tear.  An ultrasound was obtained by Dr. Penni Bombard, this revealed that there was a tear of the quadriceps tendon.  The patient also has a history of patellectomy, which is also evident on x-ray.  Due to the rupture of the quadriceps tendon, options are discussed with the patient.  The patient wishes to proceed with surgery.  Risks, benefits, and expectations of the procedure were discussed with the patient.  The patient understands risks, benefits, and expectations, and wishes to proceed with a quadriceps tendon repair on the right leg per Dr. Charlann Boxer at [facility].  PAST MEDICAL HISTORY: 1. Hypertension. 2. Asthma. 3. AFib. 4. Reflux. 5. Chronic bronchitis. 6. Rheumatoid arthritis. 7. B12 deficiency. 8. History of DVT of the right leg.  PAST SURGICAL HISTORY:  Includes: 1. Bilateral total knee arthroplasties. 2. Revision of the right total knee arthroplasty. 3. Subsequent infection and placement of  antibiotic spacers x2. 4. Subsequent implantation of a right total knee revision. 5. Cataract surgery bilaterally. 6. Cardioversion from AFib.  MEDICATIONS: 1. Amiodarone 200 mg one p.o. b.i.d. 2. Celebrex 200 mg one p.o. q. day. 3. Dulera 100 mg two puffs b.i.d. 4. Edecrin 25 mg b.i.d. 5. Spironolactone 25 mg two p.o. b.i.d. 6. Neurontin 300 mg one p.o. q.h.s. 7. Pradaxa 20 mg one p.o. q. day. 8. Singulair 10 mg one p.o. q. day. 9. Prilosec 20 mg one p.o. q. day.  ALLERGIES: 1. PENICILLIN. 2. SULFA. 3. AVELOX. 4. ROBAXIN.  SOCIAL HISTORY:  Patient denies use of alcohol or tobacco.  REVIEW OF SYSTEMS:  Benign.  PHYSICAL EXAMINATION:  GENERAL:  The patient is a 65 year old white lady, in no acute distress, complaining of the inability to extend her right leg. VITAL SIGNS:  Stable. HEENT:  Pupils are equal, round, and reactive to light and accommodation.  Throat is clear. NECK:  Supple.  No lymphadenopathy noted.  No carotid bruits.  No JVD. LUNGS:  Clear to auscultation bilaterally. CARDIOVASCULAR:  Regular rate and rhythm, and normal-appearing S1, S2. No murmur appreciated. NEURO:  Patient is oriented x3.  Ortho is pertaining to the right knee. The patient does have a suspicious quadriceps deformity of the right leg, had a previous patellectomy, and  a healing scars from previous right total knee arthroplasties, then multiple surgeries.  Patient is distally neurovascularly intact.  Posterior dorsalis pedis pulse.  IMPRESSION:  Right quadriceps tendon, dehiscence.  STUDIES:  Ultrasound as above.  PLAN:  The patient admitted to the hospital to undergo a repair of the right quadriceps tendon per Dr. Charlann Boxer, at [facility] on December 08, 2010.  Risks, benefits, and expectation of the procedure were discussed with the patient.  The patient understands risks, benefits, and expectations and wishes to proceed with surgery.    ______________________________ Lanney Gins, PA   ______________________________ Madlyn Frankel. Charlann Boxer, M.D.    MB/MEDQ  D:  12/07/2010  T:  12/08/2010  Job:  [id number]  Electronically Signed by Lanney Gins PA on 12/08/2010 02:18:18 PM Electronically Signed by Durene Romans M.D. on 12/10/2010 11:18:27 AM

## 2010-12-14 ENCOUNTER — Emergency Department (HOSPITAL_COMMUNITY): Payer: Medicare Other

## 2010-12-14 ENCOUNTER — Inpatient Hospital Stay (HOSPITAL_COMMUNITY)
Admission: EM | Admit: 2010-12-14 | Discharge: 2010-12-28 | DRG: 475 | Disposition: A | Discharge status: Skilled Nursing Facility | Payer: Medicare Other | Attending: Family Medicine | Admitting: Family Medicine

## 2010-12-14 ENCOUNTER — Encounter: Payer: Self-pay | Admitting: *Deleted

## 2010-12-14 ENCOUNTER — Other Ambulatory Visit: Payer: Self-pay

## 2010-12-14 DIAGNOSIS — Z86718 Personal history of other venous thrombosis and embolism: Secondary | ICD-10-CM

## 2010-12-14 DIAGNOSIS — M79606 Pain in leg, unspecified: Secondary | ICD-10-CM

## 2010-12-14 DIAGNOSIS — M069 Rheumatoid arthritis, unspecified: Secondary | ICD-10-CM | POA: Diagnosis present

## 2010-12-14 DIAGNOSIS — R300 Dysuria: Secondary | ICD-10-CM

## 2010-12-14 DIAGNOSIS — Z87891 Personal history of nicotine dependence: Secondary | ICD-10-CM

## 2010-12-14 DIAGNOSIS — R609 Edema, unspecified: Secondary | ICD-10-CM | POA: Diagnosis not present

## 2010-12-14 DIAGNOSIS — E538 Deficiency of other specified B group vitamins: Secondary | ICD-10-CM | POA: Diagnosis present

## 2010-12-14 DIAGNOSIS — D62 Acute posthemorrhagic anemia: Secondary | ICD-10-CM | POA: Diagnosis not present

## 2010-12-14 DIAGNOSIS — G4700 Insomnia, unspecified: Secondary | ICD-10-CM | POA: Diagnosis not present

## 2010-12-14 DIAGNOSIS — Z8673 Personal history of transient ischemic attack (TIA), and cerebral infarction without residual deficits: Secondary | ICD-10-CM

## 2010-12-14 DIAGNOSIS — M009 Pyogenic arthritis, unspecified: Secondary | ICD-10-CM | POA: Diagnosis present

## 2010-12-14 DIAGNOSIS — D649 Anemia, unspecified: Secondary | ICD-10-CM | POA: Diagnosis present

## 2010-12-14 DIAGNOSIS — Z89619 Acquired absence of unspecified leg above knee: Secondary | ICD-10-CM

## 2010-12-14 DIAGNOSIS — I509 Heart failure, unspecified: Secondary | ICD-10-CM | POA: Diagnosis present

## 2010-12-14 DIAGNOSIS — Z96659 Presence of unspecified artificial knee joint: Secondary | ICD-10-CM

## 2010-12-14 DIAGNOSIS — I5030 Unspecified diastolic (congestive) heart failure: Secondary | ICD-10-CM | POA: Diagnosis present

## 2010-12-14 DIAGNOSIS — I4891 Unspecified atrial fibrillation: Secondary | ICD-10-CM | POA: Diagnosis present

## 2010-12-14 DIAGNOSIS — N390 Urinary tract infection, site not specified: Secondary | ICD-10-CM | POA: Diagnosis present

## 2010-12-14 DIAGNOSIS — J4489 Other specified chronic obstructive pulmonary disease: Secondary | ICD-10-CM | POA: Diagnosis present

## 2010-12-14 DIAGNOSIS — L02419 Cutaneous abscess of limb, unspecified: Secondary | ICD-10-CM | POA: Diagnosis present

## 2010-12-14 DIAGNOSIS — D539 Nutritional anemia, unspecified: Secondary | ICD-10-CM | POA: Diagnosis present

## 2010-12-14 DIAGNOSIS — L03119 Cellulitis of unspecified part of limb: Secondary | ICD-10-CM | POA: Diagnosis present

## 2010-12-14 DIAGNOSIS — T8450XA Infection and inflammatory reaction due to unspecified internal joint prosthesis, initial encounter: Principal | ICD-10-CM | POA: Diagnosis present

## 2010-12-14 DIAGNOSIS — J449 Chronic obstructive pulmonary disease, unspecified: Secondary | ICD-10-CM | POA: Diagnosis present

## 2010-12-14 DIAGNOSIS — K219 Gastro-esophageal reflux disease without esophagitis: Secondary | ICD-10-CM | POA: Diagnosis present

## 2010-12-14 DIAGNOSIS — I2510 Atherosclerotic heart disease of native coronary artery without angina pectoris: Secondary | ICD-10-CM | POA: Diagnosis present

## 2010-12-14 DIAGNOSIS — I959 Hypotension, unspecified: Secondary | ICD-10-CM | POA: Diagnosis present

## 2010-12-14 DIAGNOSIS — E871 Hypo-osmolality and hyponatremia: Secondary | ICD-10-CM | POA: Diagnosis present

## 2010-12-14 DIAGNOSIS — E440 Moderate protein-calorie malnutrition: Secondary | ICD-10-CM | POA: Diagnosis not present

## 2010-12-14 DIAGNOSIS — I10 Essential (primary) hypertension: Secondary | ICD-10-CM | POA: Diagnosis present

## 2010-12-14 DIAGNOSIS — Y831 Surgical operation with implant of artificial internal device as the cause of abnormal reaction of the patient, or of later complication, without mention of misadventure at the time of the procedure: Secondary | ICD-10-CM | POA: Diagnosis present

## 2010-12-14 DIAGNOSIS — Z880 Allergy status to penicillin: Secondary | ICD-10-CM

## 2010-12-14 DIAGNOSIS — Z79899 Other long term (current) drug therapy: Secondary | ICD-10-CM

## 2010-12-14 DIAGNOSIS — Z7901 Long term (current) use of anticoagulants: Secondary | ICD-10-CM

## 2010-12-14 HISTORY — DX: Rheumatoid arthritis, unspecified: M06.9

## 2010-12-14 HISTORY — DX: Vitamin B12 deficiency anemia, unspecified: D51.9

## 2010-12-14 HISTORY — DX: Unspecified osteoarthritis, unspecified site: M19.90

## 2010-12-14 LAB — CARDIAC PANEL(CRET KIN+CKTOT+MB+TROPI)
CK, MB: 1.6 ng/mL (ref 0.3–4.0)
Relative Index: INVALID (ref 0.0–2.5)
Total CK: 38 U/L (ref 7–177)
Total CK: 43 U/L (ref 7–177)

## 2010-12-14 LAB — DIFFERENTIAL
Basophils Absolute: 0 10*3/uL (ref 0.0–0.1)
Eosinophils Absolute: 0 10*3/uL (ref 0.0–0.7)
Eosinophils Absolute: 0 10*3/uL (ref 0.0–0.7)
Eosinophils Relative: 0 % (ref 0–5)
Eosinophils Relative: 0 % (ref 0–5)
Lymphocytes Relative: 4 % — ABNORMAL LOW (ref 12–46)
Lymphs Abs: 0.5 10*3/uL — ABNORMAL LOW (ref 0.7–4.0)
Lymphs Abs: 0.3 10*3/uL — ABNORMAL LOW (ref 0.7–4.0)
Monocytes Relative: 9 % (ref 3–12)
Neutrophils Relative %: 84 % — ABNORMAL HIGH (ref 43–77)
Neutrophils Relative %: 88 % — ABNORMAL HIGH (ref 43–77)

## 2010-12-14 LAB — COMPREHENSIVE METABOLIC PANEL
BUN: 14 mg/dL (ref 6–23)
CO2: 26 mEq/L (ref 19–32)
Calcium: 8.9 mg/dL (ref 8.4–10.5)
GFR calc Af Amer: >90 mL/min (ref >90)
GFR calc non Af Amer: >90 mL/min (ref >90)
Glucose, Bld: 75 mg/dL (ref 70–99)
Total Protein: 5.7 g/dL — ABNORMAL LOW (ref 6.0–8.3)

## 2010-12-14 LAB — URINALYSIS, ROUTINE W REFLEX MICROSCOPIC
Glucose, UA: NEGATIVE mg/dL
Nitrite: POSITIVE — AB
Specific Gravity, Urine: 1.014 (ref 1.005–1.030)
pH: 6 (ref 5.0–8.0)

## 2010-12-14 LAB — POCT I-STAT TROPONIN I: Troponin i, poc: 0 ng/mL (ref 0.00–0.08)

## 2010-12-14 LAB — CBC
Hemoglobin: 9.8 g/dL — ABNORMAL LOW (ref 12.0–15.0)
Hemoglobin: 9.3 g/dL — ABNORMAL LOW (ref 12.0–15.0)
MCH: 37.7 pg — ABNORMAL HIGH (ref 26.0–34.0)
MCHC: 35.6 g/dL (ref 30.0–36.0)
MCV: 107.3 fL — ABNORMAL HIGH (ref 78.0–100.0)
Platelets: 261 10*3/uL (ref 150–400)
RBC: 2.6 MIL/uL — ABNORMAL LOW (ref 3.87–5.11)
RBC: 2.43 MIL/uL — ABNORMAL LOW (ref 3.87–5.11)

## 2010-12-14 LAB — BASIC METABOLIC PANEL
BUN: 17 mg/dL (ref 6–23)
CO2: 25 mEq/L (ref 19–32)
GFR calc non Af Amer: >90 mL/min (ref >90)
Glucose, Bld: 86 mg/dL (ref 70–99)
Potassium: 3.7 mEq/L (ref 3.5–5.1)

## 2010-12-14 LAB — MAGNESIUM: Magnesium: 1.5 mg/dL (ref 1.5–2.5)

## 2010-12-14 LAB — TSH: TSH: 0.905 u[IU]/mL (ref 0.350–4.500)

## 2010-12-14 LAB — PROTIME-INR: Prothrombin Time: 19.3 seconds — ABNORMAL HIGH (ref 11.6–15.2)

## 2010-12-14 LAB — VITAMIN B12: Vitamin B-12: 453 pg/mL (ref 211–911)

## 2010-12-14 LAB — URINE MICROSCOPIC-ADD ON

## 2010-12-14 LAB — APTT: aPTT: 42 seconds — ABNORMAL HIGH (ref 24–37)

## 2010-12-14 MED ORDER — ACETAMINOPHEN 325 MG PO TABS
650.0000 mg | ORAL_TABLET | Freq: Four times a day (QID) | ORAL | Status: DC | PRN
  Administered 2010-12-14: 650 mg via ORAL
  Filled 2010-12-14 (×2): qty 2

## 2010-12-14 MED ORDER — MORPHINE SULFATE 4 MG/ML IJ SOLN
INTRAMUSCULAR | Status: AC
  Filled 2010-12-14: qty 1

## 2010-12-14 MED ORDER — SODIUM CHLORIDE 0.9 % IV SOLN
500.0000 mg | Freq: Four times a day (QID) | INTRAVENOUS | Status: DC
  Administered 2010-12-14 – 2010-12-21 (×26): 500 mg via INTRAVENOUS
  Filled 2010-12-14 (×30): qty 500

## 2010-12-14 MED ORDER — ONDANSETRON HCL 4 MG PO TABS
4.0000 mg | ORAL_TABLET | Freq: Four times a day (QID) | ORAL | Status: DC | PRN
  Filled 2010-12-14: qty 1

## 2010-12-14 MED ORDER — MORPHINE SULFATE 4 MG/ML IJ SOLN
4.0000 mg | Freq: Once | INTRAMUSCULAR | Status: AC
  Administered 2010-12-14: 4 mg via INTRAVENOUS

## 2010-12-14 MED ORDER — ALUM & MAG HYDROXIDE-SIMETH 200-200-20 MG/5ML PO SUSP
30.0000 mL | ORAL | Status: DC | PRN
  Administered 2010-12-25 – 2010-12-28 (×4): 30 mL via ORAL
  Filled 2010-12-14 (×4): qty 30

## 2010-12-14 MED ORDER — HEPARIN SODIUM (PORCINE) 5000 UNIT/ML IJ SOLN
5000.0000 [IU] | Freq: Three times a day (TID) | INTRAMUSCULAR | Status: DC
  Administered 2010-12-14 (×2): 5000 [IU] via SUBCUTANEOUS
  Filled 2010-12-14 (×6): qty 1

## 2010-12-14 MED ORDER — VANCOMYCIN HCL IN DEXTROSE 1-5 GM/200ML-% IV SOLN
1000.0000 mg | Freq: Two times a day (BID) | INTRAVENOUS | Status: DC
  Administered 2010-12-14 – 2010-12-22 (×18): 1000 mg via INTRAVENOUS
  Filled 2010-12-14 (×20): qty 200

## 2010-12-14 MED ORDER — POLYETHYLENE GLYCOL 3350 17 G PO PACK
17.0000 g | PACK | Freq: Every day | ORAL | Status: DC
  Administered 2010-12-15 – 2010-12-20 (×3): 17 g via ORAL
  Filled 2010-12-14 (×8): qty 1

## 2010-12-14 MED ORDER — DIGOXIN 0.1 MG/ML IJ SOLN
0.1250 mg | Freq: Once | INTRAMUSCULAR | Status: AC
  Administered 2010-12-14: 0.125 mg via INTRAVENOUS
  Filled 2010-12-14: qty 2

## 2010-12-14 MED ORDER — MENTHOL 3 MG MT LOZG: 1.0000 | LOZENGE | OROMUCOSAL | Status: DC | PRN

## 2010-12-14 MED ORDER — PHENOL 1.4 % MT LIQD: 1.0000 | OROMUCOSAL | Status: DC | PRN

## 2010-12-14 MED ORDER — SODIUM CHLORIDE 0.9 % IV SOLN
INTRAVENOUS | Status: DC
  Administered 2010-12-14 – 2010-12-15 (×2): via INTRAVENOUS

## 2010-12-14 MED ORDER — HYDROCODONE-ACETAMINOPHEN 5-325 MG PO TABS
1.0000 | ORAL_TABLET | ORAL | Status: DC | PRN
  Administered 2010-12-15 (×2): 1 via ORAL
  Administered 2010-12-15: 2 via ORAL
  Administered 2010-12-16: 1 via ORAL
  Administered 2010-12-16 (×2): 2 via ORAL
  Administered 2010-12-16: 1 via ORAL
  Administered 2010-12-17 (×2): 2 via ORAL
  Filled 2010-12-14: qty 2
  Filled 2010-12-14: qty 1
  Filled 2010-12-14: qty 2
  Filled 2010-12-14 (×2): qty 1
  Filled 2010-12-14 (×3): qty 2
  Filled 2010-12-14: qty 1

## 2010-12-14 MED ORDER — ONDANSETRON HCL 4 MG/2ML IJ SOLN: 4.0000 mg | Freq: Four times a day (QID) | INTRAMUSCULAR | Status: DC | PRN

## 2010-12-14 MED ORDER — BISACODYL 10 MG RE SUPP: 10.0000 mg | Freq: Every day | RECTAL | Status: DC | PRN

## 2010-12-14 MED ORDER — ZOLPIDEM TARTRATE 5 MG PO TABS: 5.0000 mg | ORAL_TABLET | Freq: Every evening | ORAL | Status: DC | PRN

## 2010-12-14 MED ORDER — HYDROMORPHONE HCL PF 2 MG/ML IJ SOLN
INTRAMUSCULAR | Status: AC
  Administered 2010-12-14: 1 mg via INTRAVENOUS
  Filled 2010-12-14: qty 1

## 2010-12-14 MED ORDER — METOCLOPRAMIDE HCL 5 MG/ML IJ SOLN: 5.0000 mg | Freq: Three times a day (TID) | INTRAMUSCULAR | Status: DC | PRN

## 2010-12-14 MED ORDER — BISACODYL 5 MG PO TBEC: 10.0000 mg | DELAYED_RELEASE_TABLET | Freq: Every day | ORAL | Status: DC | PRN

## 2010-12-14 MED ORDER — SODIUM CHLORIDE 0.9 % IV BOLUS (SEPSIS)
1000.0000 mL | Freq: Once | INTRAVENOUS | Status: AC
  Administered 2010-12-14: 1000 mL via INTRAVENOUS

## 2010-12-14 MED ORDER — DOCUSATE SODIUM 100 MG PO CAPS
100.0000 mg | ORAL_CAPSULE | Freq: Two times a day (BID) | ORAL | Status: DC
  Administered 2010-12-15 – 2010-12-16 (×3): 100 mg via ORAL
  Filled 2010-12-14 (×9): qty 1

## 2010-12-14 MED ORDER — SENNOSIDES-DOCUSATE SODIUM 8.6-50 MG PO TABS
1.0000 | ORAL_TABLET | Freq: Every evening | ORAL | Status: DC | PRN
  Administered 2010-12-16: 1 via ORAL
  Filled 2010-12-14: qty 1

## 2010-12-14 MED ORDER — DILTIAZEM HCL 25 MG/5ML IV SOLN: 10.0000 mg | Freq: Once | INTRAVENOUS | Status: DC

## 2010-12-14 MED ORDER — ACETAMINOPHEN 650 MG RE SUPP: 650.0000 mg | Freq: Four times a day (QID) | RECTAL | Status: DC | PRN

## 2010-12-14 MED ORDER — DILTIAZEM LOAD VIA INFUSION
10.0000 mg | Freq: Once | INTRAVENOUS | Status: AC
  Administered 2010-12-14: 10 mg via INTRAVENOUS
  Filled 2010-12-14: qty 10

## 2010-12-14 MED ORDER — ALUMINUM HYDROXIDE GEL 600 MG/5ML PO SUSP: 15.0000 mL | ORAL | Status: DC | PRN

## 2010-12-14 MED ORDER — POTASSIUM CHLORIDE IN NACL 20-0.9 MEQ/L-% IV SOLN
100.0000 mL/h | INTRAVENOUS | Status: DC
  Administered 2010-12-15: 100 mL/h via INTRAVENOUS
  Filled 2010-12-14 (×4): qty 1000

## 2010-12-14 MED ORDER — DILTIAZEM HCL 100 MG IV SOLR
INTRAVENOUS | Status: AC
  Administered 2010-12-14: 05:00:00
  Filled 2010-12-14: qty 100

## 2010-12-14 MED ORDER — DIGOXIN 0.25 MG/ML IJ SOLN
0.2500 mg | INTRAMUSCULAR | Status: AC
Start: 2010-12-14 — End: 2010-12-14
  Administered 2010-12-14: 0.25 mg via INTRAVENOUS
  Filled 2010-12-14 (×2): qty 1

## 2010-12-14 MED ORDER — DIGOXIN 0.25 MG/ML IJ SOLN
0.1250 mg | Freq: Every day | INTRAMUSCULAR | Status: DC
  Administered 2010-12-15: 0.125 mg via INTRAVENOUS
  Filled 2010-12-14 (×3): qty 2

## 2010-12-14 MED ORDER — FLEET ENEMA 7-19 GM/118ML RE ENEM: 1.0000 | ENEMA | Freq: Every day | RECTAL | Status: DC | PRN

## 2010-12-14 MED ORDER — METOCLOPRAMIDE HCL 10 MG PO TABS: 5.0000 mg | ORAL_TABLET | Freq: Three times a day (TID) | ORAL | Status: DC | PRN

## 2010-12-14 MED ORDER — HYDROMORPHONE HCL PF 1 MG/ML IJ SOLN
1.0000 mg | INTRAMUSCULAR | Status: DC | PRN
  Administered 2010-12-14 – 2010-12-17 (×7): 1 mg via INTRAVENOUS
  Administered 2010-12-17: 0.5 mg via INTRAVENOUS
  Administered 2010-12-17: 1 mg via INTRAVENOUS
  Filled 2010-12-14 (×7): qty 1

## 2010-12-14 MED ORDER — RIVAROXABAN 10 MG PO TABS
10.0000 mg | ORAL_TABLET | ORAL | Status: DC
Start: 2010-12-14 — End: 2010-12-15

## 2010-12-14 MED ORDER — SENNA 8.6 MG PO TABS
1.0000 | ORAL_TABLET | Freq: Two times a day (BID) | ORAL | Status: DC
  Administered 2010-12-15 – 2010-12-20 (×8): 8.6 mg via ORAL
  Filled 2010-12-14 (×11): qty 1

## 2010-12-14 NOTE — ED Notes (Signed)
Pt returns from Xray.

## 2010-12-14 NOTE — ED Notes (Signed)
HYQ:[id number]<IO> Expected date:12/14/10<BR> Expected time: 2:41 AM<BR> Means of arrival:Ambulance<BR> Comments:<BR> Knee pain/redness

## 2010-12-14 NOTE — H&P (Signed)
NAMEHAWLEY, PAVIA                  ACCOUNT NO.: [id number]   MEDICAL RECORD NO.: [id number]   LOCATION: FACILITY: [facility]   PHYSICIAN: Madlyn Frankel. Charlann Boxer, M.D.               DATE OF BIRTH: April 14, 1945   DATE OF ADMISSION:  12/14/2010  HISTORY & PHYSICAL   CHIEF COMPLAINT:  Post-op wound concerns.  HISTORY OF PRESENT ILLNESS: Patient is a 65 year old lady with history of total knee arthroplasty with infection, who had the prosthetic removed, and antibiotic spacer placed. On subsequent removal of the spacers, she had signs of infection and additional antibiotic spacer was implanted. Thereafter, the patient had a revision of the right total knee. The patient has been doing well since that time, but has had an inability to extend her legs due to concerns with a possibility of a quadriceps tendon tear. An ultrasound was obtained by Dr. Penni Bombard, this revealed that there was a tear of the quadriceps tendon. The patient  also has a history of patellectomy, which is also evident on x-ray. Pt then was scheduled for and a quadriceps tendon repair was preformed. She tolerated the procedure about 1 week ago without difficulty. Pt subsequently went to the ER with concerns regarding her incision. She had small blisters and an area of ecchymosis over the distal area of the incision. Pt will be admitted to the hospital for treatment of antibiotics and wound care consult.   PAST MEDICAL HISTORY:  1. Hypertension.  2. Asthma.  3. AFib.  4. Reflux.  5. Chronic bronchitis.  6. Rheumatoid arthritis.  7. B12 deficiency.  8. History of DVT of the right leg.   PAST SURGICAL HISTORY: Includes:  1. Bilateral total knee arthroplasties.  2. Revision of the right total knee arthroplasty.  3. Subsequent infection and placement of antibiotic spacers x2.  4. Subsequent implantation of a right total knee revision.  5. Cataract surgery bilaterally.  6. Cardioversion from AFib.   MEDICATIONS:  1. Amiodarone 200 mg  one p.o. b.i.d.  2. Celebrex 200 mg one p.o. q. day.  3. Dulera 100 mg two puffs b.i.d.  4. Edecrin 25 mg b.i.d.  5. Spironolactone 25 mg two p.o. b.i.d.  6. Neurontin 300 mg one p.o. q.h.s.  7. Pradaxa 20 mg one p.o. q. day.  8. Singulair 10 mg one p.o. q. day.  9. Prilosec 20 mg one p.o. q. day.   ALLERGIES:  1. PENICILLIN.  2. SULFA.  3. AVELOX.  4. ROBAXIN.   SOCIAL HISTORY: Patient denies use of alcohol or tobacco.   REVIEW OF SYSTEMS: Complaining of areas of blistering over the distal portion of the incision, otherwise benign.   PHYSICAL EXAMINATION: GENERAL: The patient is a 65 year old white lady, in no acute distress, complaining of blisters and area of swelling over the distal area portion of her previous surgical incision.  VITAL SIGNS: Stable.  HEENT: Pupils are equal, round, and reactive to light and accommodation. Throat is clear.  NECK: Supple. No lymphadenopathy noted. No carotid bruits. No JVD.  LUNGS: Clear to auscultation bilaterally.  CARDIOVASCULAR: Regular rate and rhythm, and normal-appearing S1, S2. No murmur appreciated.  NEURO: Patient is oriented x3.  ORTHO: As pertaining to the right knee. The patient has had a previous patellectomy, and a healing scars from previous right total knee arthroplasties, then multiple surgeries. Patient is distally neurovascularly intact. Posterior dorsalis pedis pulse. Area of ecchymosis and  small blisters over the distal portion of the incision on the right knee.  IMPRESSION: Post-op wound concerns.   PLAN: The patient admitted to the hospital for a wound care consult on the incision of the right knee. Pt will also receive antibiotics for the incision.  Anastasio Auerbach Yulieth Carrender   PAC   12/14/2010, 6:06 PM

## 2010-12-14 NOTE — ED Notes (Signed)
Pt transported to xray via cart.  

## 2010-12-14 NOTE — ED Provider Notes (Signed)
History     CSN: [id number] Arrival date & time: 12/14/2010  3:17 AM   None     Chief Complaint  Patient presents with  . Knee Pain    (Consider location/radiation/quality/duration/timing/severity/associated sxs/prior treatment) HPI Comments: 65 year old female who presents approximately one week after having a right knee replacement. This is a second replacement for her after the first one became infected approximately 11 months ago. She states that her pain became acutely worse yesterday and has been constant, severe, associated with swelling redness and bloody discharge. She states it's worse with moving bending or palpating the knee. She denies fevers but has had chills and a rapid irregular heartbeat. She has a history of atrial fibrillation which has been ablated in the past. She states that she has been off of her Coumadin because INR was elevated  Patient is a 65 y.o. female presenting with knee pain. The history is provided by the patient and medical records.  Knee Pain    No past medical history on file.  No past surgical history on file.  No family history on file.  History  Substance Use Topics  . Smoking status: Not on file  . Smokeless tobacco: Not on file  . Alcohol Use: Not on file    OB History    Grav Para Term Preterm Abortions TAB SAB Ect Mult Living                  Review of Systems  All other systems reviewed and are negative.    Allergies  Review of patient's allergies indicates not on file.  Home Medications  No current outpatient prescriptions on file.  BP 98/54  Pulse 123  Temp(Src) 98.2 F (36.8 C) (Oral)  Resp 18  Physical Exam  Nursing note and vitals reviewed. Constitutional: She appears well-developed and well-nourished. No distress.  HENT:  Head: Normocephalic and atraumatic.  Mouth/Throat: Oropharynx is clear and moist. No oropharyngeal exudate.  Eyes: Conjunctivae and EOM are normal. Pupils are equal, round, and  reactive to light. Right eye exhibits no discharge. Left eye exhibits no discharge. No scleral icterus.  Neck: Normal range of motion. Neck supple. No JVD present. No thyromegaly present.  Cardiovascular: Normal heart sounds and intact distal pulses.  Exam reveals no gallop and no friction rub.   No murmur heard.      Irregular rate, tachycardia to 120  Pulmonary/Chest: Effort normal and breath sounds normal. No respiratory distress. She has no wheezes. She has no rales.  Abdominal: Soft. Bowel sounds are normal. She exhibits no distension and no mass. There is no tenderness.  Musculoskeletal: She exhibits edema ( Severe right lower extremity edema with erythema, warmth, tenderness.).       Tender to the right lower extremity with decreased range of motion, warmth, swelling, redness. Pulses found by Doppler at the foot.  Lymphadenopathy:    She has no cervical adenopathy.  Neurological: She is alert. Coordination normal.  Skin: Skin is warm and dry. Rash noted. There is erythema.  Psychiatric: She has a normal mood and affect. Her behavior is normal.    ED Course  Procedures (including critical care time)   Labs Reviewed  CBC  DIFFERENTIAL  BASIC METABOLIC PANEL  LACTIC ACID, PLASMA  CULTURE, BLOOD (ROUTINE X 2)  URINE CULTURE  URINALYSIS, ROUTINE W REFLEX MICROSCOPIC   No results found.   No diagnosis found.    MDM  Lower extremity appears to have either infection or possible DVT. Acute  onset of pain would also consider compartment syndrome. Laboratory evaluation, x-rays, treat atrial fibrillation as well.  ED ECG REPORT   Date: 12/14/2010   Rate: 130  Rhythm: atrial fibrillation  QRS Axis: left  Intervals: normal  ST/T Wave abnormalities: nonspecific ST/T changes  Conduction Disutrbances:none  Narrative Interpretation: 07/31/10 - nsr replaced by afib today  Old EKG Reviewed: none available   Results for orders placed during the hospital encounter of 12/14/10  CBC        Component Value Range   WBC 7.8  4.0 - 10.5 (K/uL)   RBC 2.60 (*) 3.87 - 5.11 (MIL/uL)   Hemoglobin 9.8 (*) 12.0 - 15.0 (g/dL)   HCT 11.9 (*) 14.7 - 46.0 (%)   MCV 107.3 (*) 78.0 - 100.0 (fL)   MCH 37.7 (*) 26.0 - 34.0 (pg)   MCHC 35.1  30.0 - 36.0 (g/dL)   RDW 82.9  56.2 - 13.0 (%)   Platelets 282  150 - 400 (K/uL)  DIFFERENTIAL      Component Value Range   Neutrophils Relative 84 (*) 43 - 77 (%)   Neutro Abs 6.6  1.7 - 7.7 (K/uL)   Lymphocytes Relative 6 (*) 12 - 46 (%)   Lymphs Abs 0.5 (*) 0.7 - 4.0 (K/uL)   Monocytes Relative 9  3 - 12 (%)   Monocytes Absolute 0.7  0.1 - 1.0 (K/uL)   Eosinophils Relative 0  0 - 5 (%)   Eosinophils Absolute 0.0  0.0 - 0.7 (K/uL)   Basophils Relative 0  0 - 1 (%)   Basophils Absolute 0.0  0.0 - 0.1 (K/uL)  BASIC METABOLIC PANEL      Component Value Range   Sodium 125 (*) 135 - 145 (mEq/L)   Potassium 3.7  3.5 - 5.1 (mEq/L)   Chloride 90 (*) 96 - 112 (mEq/L)   CO2 25  19 - 32 (mEq/L)   Glucose, Bld 86  70 - 99 (mg/dL)   BUN 17  6 - 23 (mg/dL)   Creatinine, Ser 8.65  0.50 - 1.10 (mg/dL)   Calcium 9.7  8.4 - 78.4 (mg/dL)   GFR calc non Af Amer >90  >90 (mL/min)   GFR calc Af Amer >90  >90 (mL/min)  URINALYSIS, ROUTINE W REFLEX MICROSCOPIC      Component Value Range   Color, Urine AMBER (*) YELLOW    Appearance CLOUDY (*) CLEAR    Specific Gravity, Urine 1.014  1.005 - 1.030    pH 6.0  5.0 - 8.0    Glucose, UA NEGATIVE  NEGATIVE (mg/dL)   Hgb urine dipstick TRACE (*) NEGATIVE    Bilirubin Urine NEGATIVE  NEGATIVE    Ketones, ur TRACE (*) NEGATIVE (mg/dL)   Protein, ur 30 (*) NEGATIVE (mg/dL)   Urobilinogen, UA 1.0  0.0 - 1.0 (mg/dL)   Nitrite POSITIVE (*) NEGATIVE    Leukocytes, UA TRACE (*) NEGATIVE   PROTIME-INR      Component Value Range   Prothrombin Time 19.3 (*) 11.6 - 15.2 (seconds)   INR 1.60 (*) 0.00 - 1.49   APTT      Component Value Range   aPTT 42 (*) 24 - 37 (seconds)  URINE MICROSCOPIC-ADD ON      Component  Value Range   Squamous Epithelial / LPF FEW (*) RARE    WBC, UA 11-20  <3 (WBC/hpf)   Bacteria, UA MANY (*) RARE     Dg Knee Complete 4 Views Right  12/14/2010  *  RADIOLOGY REPORT*  Clinical Data: Recent knee replacement, now with pain and swelling.  RIGHT KNEE - COMPLETE 4+ VIEW  Comparison: 11/23/1997 report  Findings: Status post left knee arthroplasty without overt evidence for hardware complication. No acute osseous abnormality. Skin staples project anteriorly.  There is underlying subcutaneous gas which is nonspecific in the recently postoperative state.  IMPRESSION: Status post right knee arthroplasty.  Subcutaneous gas anteriorly is nonspecific in the recently postoperative state, however infection not excluded.  Original Report Authenticated By: Waneta Martins, M.D.    D/w Dr. Rennis Chris who will have orthopedics evaluate this AM - paged hospitalist - awaiting return call.   Should has had ongoing hypotension and tachycardia with atrial fibrillation and rapid ventricular rate. She has responded intermittently to fluid boluses and Cardizem. I have discussed her care with the hospitalist who will admit her. A step down bed has been requested. Orthopedics will evaluate the patient this morning.  CRITICAL CARE Performed by: Vida Roller   Total critical care time: 35  Critical care time was exclusive of separately billable procedures and treating other patients.  Critical care was necessary to treat or prevent imminent or life-threatening deterioration.  Critical care was time spent personally by me on the following activities: development of treatment plan with patient and/or surrogate as well as nursing, discussions with consultants, evaluation of patient's response to treatment, examination of patient, obtaining history from patient or surrogate, ordering and performing treatments and interventions, ordering and review of laboratory studies, ordering and review of radiographic  studies, pulse oximetry and re-evaluation of patient's condition.   Vida Roller, MD 12/14/10 [phone]

## 2010-12-14 NOTE — ED Notes (Signed)
Important message (IM) from medicare not completed. ED CM reviewed with pt.  IM signed by pt. Original given to pt and copy placed on chart.

## 2010-12-14 NOTE — ED Notes (Signed)
Lab bedside.

## 2010-12-14 NOTE — ED Notes (Signed)
Pt inquired if medicaid listed for her coverage. Verified medicaid is listed and informed pt of listed coverage.   Pt inquired about diet pepsi or diet coke.  ED RN, [place] notified of pt's drink request

## 2010-12-14 NOTE — ED Notes (Signed)
Pt transferred to [facility] and is on the phone with family.

## 2010-12-14 NOTE — ED Notes (Signed)
Patient is resting comfortably.Rt knee necrotic swelling painful to the touch only doppler pluse

## 2010-12-14 NOTE — ED Notes (Signed)
Patient is resting comfortably. 

## 2010-12-14 NOTE — ED Notes (Signed)
Pt has incision to right knee, +edema, +blister, +redness, +blackened incision line, well approx, + drainage, area warm, firm to touch, ankle to mid thigh, MD bedside to eval

## 2010-12-14 NOTE — Consult Note (Signed)
Reason for Consult:Right knee pain  Referring Physician: Charlann Boxer  HPI: [name] is an 65 y.o. female S/P Rtka revision by Dr Charlann Boxer 10/30  Past Medical History  Diagnosis Date  . Emphysema   . Coronary artery disease   . Atrial fibrillation   . Arthritis     Past Surgical History  Procedure Date  . Replacement total knee   . Cardiac electrophysiology mapping and ablation     History reviewed. No pertinent family history.  Social History:  reports that she has been smoking Cigarettes.  She does not have any smokeless tobacco history on file. Her alcohol and drug histories not on file.  Allergies:  Allergies  Allergen Reactions  . Sulfa Antibiotics Shortness Of Breath  . Avelox (Moxifloxacin Hcl In Nacl)   . Penicillins     Medications: I have reviewed the patient's current medications.  Results for orders placed during the hospital encounter of 12/14/10 (from the past 48 hour(s))  CBC     Status: Abnormal   Collection Time   12/14/10  3:15 AM      Component Value Range Comment   WBC 7.8  4.0 - 10.5 (K/uL)    RBC 2.60 (*) 3.87 - 5.11 (MIL/uL)    Hemoglobin 9.8 (*) 12.0 - 15.0 (g/dL)    HCT 40.9 (*) 81.1 - 46.0 (%)    MCV 107.3 (*) 78.0 - 100.0 (fL)    MCH 37.7 (*) 26.0 - 34.0 (pg)    MCHC 35.1  30.0 - 36.0 (g/dL)    RDW 91.4  78.2 - 95.6 (%)    Platelets 282  150 - 400 (K/uL)   DIFFERENTIAL     Status: Abnormal   Collection Time   12/14/10  3:15 AM      Component Value Range Comment   Neutrophils Relative 84 (*) 43 - 77 (%)    Neutro Abs 6.6  1.7 - 7.7 (K/uL)    Lymphocytes Relative 6 (*) 12 - 46 (%)    Lymphs Abs 0.5 (*) 0.7 - 4.0 (K/uL)    Monocytes Relative 9  3 - 12 (%)    Monocytes Absolute 0.7  0.1 - 1.0 (K/uL)    Eosinophils Relative 0  0 - 5 (%)    Eosinophils Absolute 0.0  0.0 - 0.7 (K/uL)    Basophils Relative 0  0 - 1 (%)    Basophils Absolute 0.0  0.0 - 0.1 (K/uL)   BASIC METABOLIC PANEL     Status: Abnormal   Collection Time   12/14/10   3:15 AM      Component Value Range Comment   Sodium 125 (*) 135 - 145 (mEq/L)    Potassium 3.7  3.5 - 5.1 (mEq/L)    Chloride 90 (*) 96 - 112 (mEq/L)    CO2 25  19 - 32 (mEq/L)    Glucose, Bld 86  70 - 99 (mg/dL)    BUN 17  6 - 23 (mg/dL)    Creatinine, Ser 2.13  0.50 - 1.10 (mg/dL)    Calcium 9.7  8.4 - 10.5 (mg/dL)    GFR calc non Af Amer >90  >90 (mL/min)    GFR calc Af Amer >90  >90 (mL/min)   PROTIME-INR     Status: Abnormal   Collection Time   12/14/10  3:15 AM      Component Value Range Comment   Prothrombin Time 19.3 (*) 11.6 - 15.2 (seconds)    INR 1.60 (*) 0.00 -  1.49    APTT     Status: Abnormal   Collection Time   12/14/10  3:15 AM      Component Value Range Comment   aPTT 42 (*) 24 - 37 (seconds)   URINALYSIS, ROUTINE W REFLEX MICROSCOPIC     Status: Abnormal   Collection Time   12/14/10  3:37 AM      Component Value Range Comment   Color, Urine AMBER (*) YELLOW  BIOCHEMICALS MAY BE AFFECTED BY COLOR   Appearance CLOUDY (*) CLEAR     Specific Gravity, Urine 1.014  1.005 - 1.030     pH 6.0  5.0 - 8.0     Glucose, UA NEGATIVE  NEGATIVE (mg/dL)    Hgb urine dipstick TRACE (*) NEGATIVE     Bilirubin Urine NEGATIVE  NEGATIVE     Ketones, ur TRACE (*) NEGATIVE (mg/dL)    Protein, ur 30 (*) NEGATIVE (mg/dL)    Urobilinogen, UA 1.0  0.0 - 1.0 (mg/dL)    Nitrite POSITIVE (*) NEGATIVE     Leukocytes, UA TRACE (*) NEGATIVE    URINE MICROSCOPIC-ADD ON     Status: Abnormal   Collection Time   12/14/10  3:37 AM      Component Value Range Comment   Squamous Epithelial / LPF FEW (*) RARE     WBC, UA 11-20  <3 (WBC/hpf) WITH CLUMPS   Bacteria, UA MANY (*) RARE    LACTIC ACID, PLASMA     Status: Normal   Collection Time   12/14/10  4:12 AM      Component Value Range Comment   Lactic Acid, Venous 1.4  0.5 - 2.2 (mmol/L)     Dg Knee Complete 4 Views Right  12/14/2010  *RADIOLOGY REPORT*  Clinical Data: Recent knee replacement, now with pain and swelling.  RIGHT KNEE -  COMPLETE 4+ VIEW  Comparison: 11/23/1997 report  Findings: Status post left knee arthroplasty without overt evidence for hardware complication. No acute osseous abnormality. Skin staples project anteriorly.  There is underlying subcutaneous gas which is nonspecific in the recently postoperative state.  IMPRESSION: Status post right knee arthroplasty.  Subcutaneous gas anteriorly is nonspecific in the recently postoperative state, however infection not excluded.  Original Report Authenticated By: Waneta Martins, M.D.    ROS: as noted above  Physical Exam: Rightknee with superficial skin necrosis distal half of incision. scant bloody drainage from lateral superficial blister. Diffusely swollen and tender. n/v intact distally, dopplerable pulse Vitals Temp:  [98.2 F (36.8 C)-99.6 F (37.6 C)] 99.6 F (37.6 C) (11/05 0348) Pulse Rate:  [103-126] 103  (11/05 0530) Resp:  [18-20] 20  (11/05 0530) BP: (90-108)/(53-64) 90/53 mmHg (11/05 0530) SpO2:  [93 %-99 %] 99 % (11/05 0338)  Assessment/Plan: Impression: post op skin superficial skin necrosis s/p revision R tka Treatment: I will ask Dr. Charlann Boxer to come and evaluate. Keep NPO  [name] M 12/14/2010, 6:16 AM

## 2010-12-14 NOTE — ED Notes (Signed)
Pt had right knee replacement one wk ago; has developed blackened areas of skin/blistering/weeping around right knee staples; redness noted down leg; hypotensive on admission

## 2010-12-14 NOTE — ED Notes (Signed)
Pt voiced concern that her CV MD, Dr Chales Abrahams should be contacted and consulted for cardiac conditions.  Note completed for MD

## 2010-12-14 NOTE — ED Notes (Signed)
Gave report to ICU RN. Will transport pt via stretcher to floor.

## 2010-12-14 NOTE — ED Notes (Signed)
MD at bedside. 

## 2010-12-14 NOTE — H&P (Signed)
PCP:   Juleen China, MD  [facility].  Primary Cardiologist: Constance Haw, MD. [place]  Chief Complaint:  Pain, swelling and redness rt lower extremity for 2 days. Fever, chills. Palpitations and shortness of breath overnight.  HPI: This is a 65 year female. For past medical history, see below. She is s/p right  knee replacement for osteoarthritis, complicated by infection, requiring revision and intraarticular antibiotics, by Dr Durene Romans, orthopedic surgeon. The last surgical intervention was on 12/08/10. Patient was discharged home in satisfactory condition, and underwent PT/OT. On 11/12/2010, she noticed a bluish discoloration of the skin of right knee, associated wih redness, increased local temperature, swelling and pain. She had subjective fever and chills. This has become progressive, and she now has difficulty ambulating. Overnight she has had palpitations, and SOBOE. No chest pain, nausea or diaphoresis. This morning, she called EMS, and was brought to the ED, where she was found to be in rapid atrial fibrillation with a borderline low BP. Dr Francena Hanly has been consulted for orthopedic problems, and patient referred to the medical service for admission and management.  Allergies:   Allergies  Allergen Reactions  . Sulfa Antibiotics Shortness Of Breath  . Avelox (Moxifloxacin Hcl In Nacl)   . Penicillins       Past Medical History  Diagnosis Date  . Emphysema   . Coronary artery disease   . History of Atrial fibrillation, S/P ablation   . Arthritis   . COPD (chronic obstructive pulmonary disease)   .          Past Surgical History  Procedure Date  . Replacement total knee   . Cardiac electrophysiology mapping and ablation     Prior to Admission medications   Medication Sig Start Date End Date Taking? Authorizing Provider  HYDROcodone-acetaminophen (NORCO) 10-325 MG per tablet Take 1 tablet by mouth every 6 (six) hours as needed. pain     Yes Historical Provider, MD  dabigatran (PRADAXA) 150 MG CAPS Take 150 mg by mouth every 12 (twelve) hours.      Historical Provider, MD  ethacrynic acid (EDECRIN) 25 MG tablet Take 25 mg by mouth 4 (four) times daily.      Historical Provider, MD  spironolactone (ALDACTONE) 25 MG tablet Take 25 mg by mouth 2 (two) times daily.      Historical Provider, MD    Social History: Patient is widowed in 61, Ex-smoker, 1 and 1/2 pack/Day for 13 years, quit 13 years ago.  She reports that she does not drink alcohol. No history of drug abuse.  Family History: No offspring. Patient's mother died at age 55 years from CHF, and her father died at age 61 years from meningitis.  Review of Systems:  As per HPI and chief complaint. Patent denies fatigue, diminished appetite, weight loss, headache, blurred vision, difficulty in speaking, dysphagia, chest pain, cough, shortness of breath, orthopnea, paroxysmal nocturnal dyspnea, nausea, diaphoresis, abdominal pain, vomiting, diarrhea, belching, heartburn, hematemesis, melena, dysuria, nocturia, urinary frequency, hematochezia, lower extremity swelling, pain, or redness. The rest of the systems review is negative.  Physical Exam:  General:  Patient does not appear to be in obvious acute distress, except when she moves her right leg. Alert, communicative, fully oriented, talking in complete sentences, not short of breath at rest.  HEENT:  Mild clinical pallor, no jaundice, no conjunctival injection or discharge. NECK:  Supple, JVP not seen, no palpable lymphadenopathy, no palpable goiter. CHEST:  Clinically clear to auscultation,  no wheezes, no crackles. HEART:  Sounds 1 and 2 heard, normal, regular, no murmurs. ABDOMEN:  Full, soft, moderately obese, non-tender, no palpable organomegaly, no palpable masses, normal bowel sounds. LOWER EXTREMITIES:  Patient has swollen, tender right leg. The skin over her right knee is blistering, red and purplish in some areas.  Surgical staples are still in situ and patient has diminished ROM of the knee. Ankle joint appears unaffected. Difficult to palpate pulses, due to associated tenderness and edema. Left leg is unremarkable, with no pitting edema and palpable peripheral pulses. MUSCULOSKELETAL SYSTEM:  Generalized osteoarthritic changes, otherwise, normal. CENTRAL NERVOUS SYSTEM:  No focal neurologic deficit on gross examination.  Labs on Admission:  Results for orders placed during the hospital encounter of 12/14/10 (from the past 48 hour(s))  CBC     Status: Abnormal   Collection Time   12/14/10  3:15 AM      Component Value Range Comment   WBC 7.8  4.0 - 10.5 (K/uL)    RBC 2.60 (*) 3.87 - 5.11 (MIL/uL)    Hemoglobin 9.8 (*) 12.0 - 15.0 (g/dL)    HCT 78.2 (*) 95.6 - 46.0 (%)    MCV 107.3 (*) 78.0 - 100.0 (fL)    MCH 37.7 (*) 26.0 - 34.0 (pg)    MCHC 35.1  30.0 - 36.0 (g/dL)    RDW 21.3  08.6 - 57.8 (%)    Platelets 282  150 - 400 (K/uL)   DIFFERENTIAL     Status: Abnormal   Collection Time   12/14/10  3:15 AM      Component Value Range Comment   Neutrophils Relative 84 (*) 43 - 77 (%)    Neutro Abs 6.6  1.7 - 7.7 (K/uL)    Lymphocytes Relative 6 (*) 12 - 46 (%)    Lymphs Abs 0.5 (*) 0.7 - 4.0 (K/uL)    Monocytes Relative 9  3 - 12 (%)    Monocytes Absolute 0.7  0.1 - 1.0 (K/uL)    Eosinophils Relative 0  0 - 5 (%)    Eosinophils Absolute 0.0  0.0 - 0.7 (K/uL)    Basophils Relative 0  0 - 1 (%)    Basophils Absolute 0.0  0.0 - 0.1 (K/uL)   BASIC METABOLIC PANEL     Status: Abnormal   Collection Time   12/14/10  3:15 AM      Component Value Range Comment   Sodium 125 (*) 135 - 145 (mEq/L)    Potassium 3.7  3.5 - 5.1 (mEq/L)    Chloride 90 (*) 96 - 112 (mEq/L)    CO2 25  19 - 32 (mEq/L)    Glucose, Bld 86  70 - 99 (mg/dL)    BUN 17  6 - 23 (mg/dL)    Creatinine, Ser 4.69  0.50 - 1.10 (mg/dL)    Calcium 9.7  8.4 - 10.5 (mg/dL)    GFR calc non Af Amer >90  >90 (mL/min)    GFR calc Af Amer >90   >90 (mL/min)   PROTIME-INR     Status: Abnormal   Collection Time   12/14/10  3:15 AM      Component Value Range Comment   Prothrombin Time 19.3 (*) 11.6 - 15.2 (seconds)    INR 1.60 (*) 0.00 - 1.49    APTT     Status: Abnormal   Collection Time   12/14/10  3:15 AM      Component Value Range Comment  aPTT 42 (*) 24 - 37 (seconds)   URINALYSIS, ROUTINE W REFLEX MICROSCOPIC     Status: Abnormal   Collection Time   12/14/10  3:37 AM      Component Value Range Comment   Color, Urine AMBER (*) YELLOW  BIOCHEMICALS MAY BE AFFECTED BY COLOR   Appearance CLOUDY (*) CLEAR     Specific Gravity, Urine 1.014  1.005 - 1.030     pH 6.0  5.0 - 8.0     Glucose, UA NEGATIVE  NEGATIVE (mg/dL)    Hgb urine dipstick TRACE (*) NEGATIVE     Bilirubin Urine NEGATIVE  NEGATIVE     Ketones, ur TRACE (*) NEGATIVE (mg/dL)    Protein, ur 30 (*) NEGATIVE (mg/dL)    Urobilinogen, UA 1.0  0.0 - 1.0 (mg/dL)    Nitrite POSITIVE (*) NEGATIVE     Leukocytes, UA TRACE (*) NEGATIVE    URINE MICROSCOPIC-ADD ON     Status: Abnormal   Collection Time   12/14/10  3:37 AM      Component Value Range Comment   Squamous Epithelial / LPF FEW (*) RARE     WBC, UA 11-20  <3 (WBC/hpf) WITH CLUMPS   Bacteria, UA MANY (*) RARE    LACTIC ACID, PLASMA     Status: Normal   Collection Time   12/14/10  4:12 AM      Component Value Range Comment   Lactic Acid, Venous 1.4  0.5 - 2.2 (mmol/L)     Radiological Exams on Admission: No results found.  Assessment/Plan Principal Problem: 1. Atrial fibrillation with RVR: Patient has already responded with improved ventricular response rate, to iv Cardizem bolus, administered in ED. HR is now 96-111. Because of borderline BP, will not administer ivi Cardizem infusion. Will manage with iv Digoxin for now, and if no sustained improvement, may need to consider Amiodarone infusion. Meanwhile, cycle cardiac enzymes and check TSH. Monitor telemetrically.  Active Problems: 1.  Hypotension,  unspecified: BP has responded to ivi fluids administered by ED MD. Will continue.  2.  Cellulitis and infection of left leg: Will send off blood cultures and commence patient on Vancomycin and Primaxin. Otherwise, will defer to Orthopedics, with regards to possible recurrent knee infection.  3. UTI: Incidental finding on urinalysis. This will be adequately covered by above antibiotics.  4. Anticoagulation: Patient is on Pradaxa. This weill be continued.  5. Hyponatremia:Likely due to volume depletion. NS will be continued. Patient is not on culprit medications.  6. Macrocytic anemia: Check TSH, B12 and Folate levels.  Time Spent on Admission: 1hour, 30 mins  Mickeal Daws,[name] 12/14/2010, 8:32 AM

## 2010-12-14 NOTE — Progress Notes (Signed)
ANTIBIOTIC CONSULT NOTE - INITIAL  Pharmacy Consult for Vancomycin and Primaxin Indication:   Allergies  Allergen Reactions  . Sulfa Antibiotics Shortness Of Breath  . Avelox (Moxifloxacin Hcl In Nacl)   . Penicillins     Patient Measurements:  Ht 71 inches, Wt 72.5kg 12/08/10  Vital Signs: Temp: 98.3 F (36.8 C) (11/05 0913) Temp src: Other (Comment) (11/05 1610) BP: 105/60 mmHg (11/05 0913) Pulse Rate: 105  (11/05 0913)  Labs:  Basename 12/14/10 0315  WBC 7.8  HGB 9.8*  PLT 282  LABCREA --  CREATININE 0.60      Microbiology: Recent Results (from the past 720 hour(s))  SURGICAL PCR SCREEN     Status: Normal   Collection Time   12/08/10 11:45 AM      Component Value Range Status Comment   MRSA, PCR NEGATIVE  NEGATIVE  Final    Staphylococcus aureus NEGATIVE  NEGATIVE  Final     Medical History: Past Medical History  Diagnosis Date  . Emphysema   . Coronary artery disease   . Atrial fibrillation   . COPD (chronic obstructive pulmonary disease)   . History of atrial fibrillation     S/pP Ablation  . Asthma   . Chronic kidney disease     HX OF UTI  . Arthritis   . Osteoarthritis   . Rheumatoid arthritis   . Anemia   . B12 deficiency anemia   . Stroke     slight stroke in 2009-no deficits  . Neuromuscular disorder     hx of right quadricep rupture  . DJD (degenerative joint disease)   . 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  Goal of Therapy:   Vancomycin trough level 10-15 mcg/ml unless deep infection involving joint or prosthetic material is suspected.  Plan:   Vancomycin 1000mg  IV q12h  Primaxin 500mg  IV q6h  Follow serum creatinine  Check Vancomycin level as  appropriate  Follow available culture data  Rozina Pointer K 12/14/2010,10:44 AM

## 2010-12-15 DIAGNOSIS — I4891 Unspecified atrial fibrillation: Secondary | ICD-10-CM

## 2010-12-15 DIAGNOSIS — M7989 Other specified soft tissue disorders: Secondary | ICD-10-CM

## 2010-12-15 DIAGNOSIS — M79609 Pain in unspecified limb: Secondary | ICD-10-CM

## 2010-12-15 LAB — BASIC METABOLIC PANEL
CO2: 22 mEq/L (ref 19–32)
Calcium: 8.4 mg/dL (ref 8.4–10.5)
Chloride: 97 mEq/L (ref 96–112)
Potassium: 3.7 mEq/L (ref 3.5–5.1)
Sodium: 129 mEq/L — ABNORMAL LOW (ref 135–145)

## 2010-12-15 LAB — CARDIAC PANEL(CRET KIN+CKTOT+MB+TROPI)
Relative Index: INVALID (ref 0.0–2.5)
Total CK: 79 U/L (ref 7–177)
Troponin I: <0.3 ng/mL (ref <0.30)

## 2010-12-15 LAB — CBC
Platelets: 235 10*3/uL (ref 150–400)
RBC: 2.2 MIL/uL — ABNORMAL LOW (ref 3.87–5.11)
WBC: 6 10*3/uL (ref 4.0–10.5)

## 2010-12-15 MED ORDER — SODIUM CHLORIDE 0.9 % IV BOLUS (SEPSIS)
500.0000 mL | Freq: Once | INTRAVENOUS | Status: AC
  Administered 2010-12-15: 500 mL via INTRAVENOUS

## 2010-12-15 MED ORDER — HEPARIN SODIUM (PORCINE) 5000 UNIT/ML IJ SOLN
5000.0000 [IU] | Freq: Three times a day (TID) | INTRAMUSCULAR | Status: DC
  Administered 2010-12-15 – 2010-12-16 (×5): 5000 [IU] via SUBCUTANEOUS
  Filled 2010-12-15 (×9): qty 1

## 2010-12-15 MED ORDER — AMIODARONE HCL 200 MG PO TABS
400.0000 mg | ORAL_TABLET | Freq: Two times a day (BID) | ORAL | Status: DC
  Administered 2010-12-15 – 2010-12-17 (×5): 400 mg via ORAL
  Filled 2010-12-15 (×2): qty 2
  Filled 2010-12-15: qty 1
  Filled 2010-12-15 (×7): qty 2

## 2010-12-15 MED ORDER — DIGOXIN 125 MCG PO TABS
0.1250 mg | ORAL_TABLET | Freq: Every day | ORAL | Status: DC
  Administered 2010-12-16 – 2010-12-28 (×13): 0.125 mg via ORAL
  Filled 2010-12-15 (×13): qty 1

## 2010-12-15 MED ORDER — SODIUM CHLORIDE 0.9 % IV SOLN
Freq: Once | INTRAVENOUS | Status: AC
  Administered 2010-12-15: 09:00:00 via INTRAVENOUS

## 2010-12-15 NOTE — Progress Notes (Signed)
Subjective:     Pt is here with concerns of her wound from recent quadriceps repair. Pt noticed increased pain, swelling and blisters of the area. She called EMS who brought her to the ER on Monday early morning. The pt was having issues with her A-fib and was brought to CCU and being monitored by Cardiology.   Patient reports pain as severe.  Objective:   VITALS:   Filed Vitals:   12/15/10 1500  BP: 105/51  Pulse:   Temp:   Resp:    Cellulitis and redness of the right LE. Areas of blisters around the distal portion of the surgical incision, most of which have popped. There is no drainage of the incision.  LABS  Basename 12/15/10 0218 12/14/10 1153 12/14/10 0315  HGB 8.3* 9.3* 9.8*  HCT 23.6* 26.1* 27.9*  WBC 6.0 7.5 7.8  PLT 235 261 282     Basename 12/15/10 0218 12/14/10 1153 12/14/10 0315  NA 129* 129* 125*  K 3.7 3.8 3.7  BUN 11 14 17   CREATININE 0.52 0.52 0.60  GLUCOSE 105* 75 86     Basename 12/14/10 0315  LABPT --  INR 1.60*     Assessment/Plan:     Edema and blistering of the post-operative surgical incision from a quadriceps tendon repair from 1 week ago. Area is covered with Xeroform dressing at this time. Pt is to be seen by wound care nurse and have the area evaluated. Pt dos express the possibility of an AKA. This is discouraged at this time, as we do everything to help her heal.   Anastasio Auerbach. Danyah Guastella   PAC  12/15/2010, 4:19 PM

## 2010-12-15 NOTE — Progress Notes (Signed)
Spoke with Dr. Brien Few - continue heparin 5000 units sq q8h while pradaxa on hold (xarelto not started) pending ortho plans for +/- surgery.  Rollene Fare, 12/15/2010, 12:02 PM

## 2010-12-15 NOTE — Progress Notes (Signed)
PT Cancellation Note Order received, patient visited.  She stated she is too painful to get out of bed today and wants to talk with Dr. Charlann Boxer first. Patient reports she is familiar with procedures for PT and wants to participate when she is less painful Ebony Hail, PT

## 2010-12-15 NOTE — Progress Notes (Signed)
Subjective: Did not sleep well, however, denies chest pain or shortness of breath. Overall, pain is less.  Objective: Vital signs in last 24 hours: Temp:  [98 F (36.7 C)-102.4 F (39.1 C)] 102.4 F (39.1 C) (11/06 0400) Pulse Rate:  [90-105] 96  (11/06 0000) Resp:  [20-25] 25  (11/06 0000) BP: (92-112)/(45-82) 94/50 mmHg (11/06 0800) SpO2:  [94 %-100 %] 95 % (11/06 0000) FiO2 (%):  [2 %] 2 % (11/05 1800) Weight:  [84.5 kg (186 lb 4.6 oz)] 186 lb 4.6 oz (84.5 kg) (11/05 1830) Weight change:  Last BM Date: 12/14/10  Intake/Output from previous day: 11/05 0701 - 11/06 0700 In: 1600 [I.V.:1400; IV Piggyback:200] Out: 2800 [Urine:2800] Total I/O In: 100 [I.V.:100] Out: 50 [Urine:50]   Physical Exam:  General: Comfortable, alert, communicative, fully oriented, not short of breath at rest.  HEENT:  Moderate clinical pallor, no jaundice, no conjunctival injection or discharge. NECK:  Supple, JVP not seen, no carotid bruits, no palpable lymphadenopathy, no palpable goiter. CHEST:  Clinically clear to auscultation, no wheezes, no crackles. HEART:  Sounds 1 and 2 heard, normal, irregular, no murmurs. ABDOMEN:  Soft, non-tender, no palpable organomegaly, no palpable masses, normal bowel sounds. LOWER EXTREMITIES:  Edema, RLE is less, at least below the knee. The knee itself is under dressings. Inflammatory phenomena appears to be improved. MUSCULOSKELETAL SYSTEM:  Generalized osteoarthritic changes, otherwise, normal. CENTRAL NERVOUS SYSTEM:  No focal neurologic deficit on gross examination.  Lab Results:  [facility] 12/15/10 0218 12/14/10 1153  WBC 6.0 7.5  HGB 8.3* 9.3*  HCT 23.6* 26.1*  PLT 235 261    Basename 12/15/10 0218 12/14/10 1153  NA 129* 129*  K 3.7 3.8  CL 97 94*  CO2 22 26  GLUCOSE 105* 75  BUN 11 14  CREATININE 0.52 0.52  CALCIUM 8.4 8.9   Recent Results (from the past 240 hour(s))  SURGICAL PCR SCREEN     Status: Normal   Collection Time   12/08/10  11:45 AM      Component Value Range Status Comment   MRSA, PCR NEGATIVE  NEGATIVE  Final    Staphylococcus aureus NEGATIVE  NEGATIVE  Final      Studies/Results: Dg Chest Portable 1 View  12/14/2010  *RADIOLOGY REPORT*  Clinical Data: Shortness of breath, cough, congestion, former smoker  PORTABLE CHEST - 1 VIEW  Comparison: Chest x-ray of 12/09/2010  Findings:  The lungs are clear and slightly hyperaerated. Mediastinal contours are stable.  The heart is within upper limits of normal in stable.  No bony abnormality is seen.  IMPRESSION: No active lung disease.  No change in hyperaeration.  Original Report Authenticated By: Juline Patch, M.D.   Dg Knee Complete 4 Views Right  12/14/2010  *RADIOLOGY REPORT*  Clinical Data: Recent knee replacement, now with pain and swelling.  RIGHT KNEE - COMPLETE 4+ VIEW  Comparison: 11/23/1997 report  Findings: Status post left knee arthroplasty without overt evidence for hardware complication. No acute osseous abnormality. Skin staples project anteriorly.  There is underlying subcutaneous gas which is nonspecific in the recently postoperative state.  IMPRESSION: Status post right knee arthroplasty.  Subcutaneous gas anteriorly is nonspecific in the recently postoperative state, however infection not excluded.  Original Report Authenticated By: Waneta Martins, M.D.    Medications: Scheduled Meds:   . digoxin  0.125 mg Intravenous Once  . digoxin  0.125 mg Intravenous Daily  . digoxin  0.25 mg Intravenous STAT  . diltiazem  10 mg  Intravenous Once  . docusate sodium  100 mg Oral BID  . heparin  5,000 Units Subcutaneous Q8H  . HYDROmorphone      . imipenem-cilastatin  500 mg Intravenous Q6H  . polyethylene glycol  17 g Oral Daily  . rivaroxaban  10 mg Oral Q24H  . senna  1 tablet Oral BID AC  . vancomycin  1,000 mg Intravenous Q12H   Continuous Infusions:   . sodium chloride 100 mL/hr at 12/14/10 1900  . 0.9 % NaCl with KCl 20 mEq / L 100 mL/hr  (12/15/10 0104)   PRN Meds:.acetaminophen, acetaminophen, alum & mag hydroxide-simeth, bisacodyl, bisacodyl, HYDROcodone-acetaminophen, HYDROmorphone, menthol-cetylpyridinium, metoCLOPramide (REGLAN) injection, metoCLOPramide, ondansetron (ZOFRAN) IV, ondansetron, phenol, senna-docusate, sodium phosphate, zolpidem, DISCONTD: aluminum hydroxide  Assessment/Plan:  Principal Problem:  *Atrial fibrillation with RVR: Now rate-controlled, but BP is still borderline, so will continue to manage with Digoxin. Cardiac enzymes are unelevated. Patient continues on anticoagulation will Pradaxa. Will consult cardiologist, per patient's wishes. [facility] on call. Active Problems:  1. Hypotension, unspecified: As patient is in rate-controlled A. Fib, and clearly has no ACS, I suspect this is due to volume depletion, although sepsis is a consideration, given significant pyrexia overnight. Will bolus with ivi fluids. Patient has adequate broad spectrum antibiotic coverage.  2. Cellulitis/Infection of right leg: Continue Vancomycin/Primaxin, Day#2. Patient is spiking through antibiotics, so I suspect there is more than just superficial skin inflammation involved. Right thigh is quite swollen and tender. Although this may be a legacy of recent quadriceps tendon repair, perhaps a Ct scan may be considered. Will discuss with orthopedics. Meanwhile, will arrange venous doppler of the right thigh. 3. Query UTI: This was based on initial urinalysis. The repeat urinalysis does not substantiate this, however.  Note: Will DC prophylactic heparin, as patient continues on Pradaxa. Will restart diet, as Ortho does not plan surgery at the present time, per my discussion with Dr Charlann Boxer.   LOS: 1 day   [name],[name] 12/15/2010, 8:28 AM

## 2010-12-15 NOTE — Consult Note (Signed)
CARDIOLOGY CONSULT NOTE  Patient ID: [name] MRN: [id number] DOB/AGE: 10/16/1945 65 y.o.  Admit date: 12/14/2010 Primary Physician: [facility] [place] Primary Cardiologist: Dr. Chales Abrahams in [facility] Reason for Consultation: Atrial fibrillation  HPI: 65 yo with history of paroxysmal atrial fibrillation s/p atrial fibrillation ablation and multiple cardioversions and right TKR complicated by infection presented with cellulitis overlying the right knee, high fever, and atrial fibrillation with rapid response.  Patient has been on Pradaxa and amiodarone for atrial fibrillation at home.  Patient has had a lot of trouble with her right knee.  She had right TKR complicated by infection with removal of the prosthesis and antibiotic spacer placement.  She later had revision right TKR.  Most recently, she had repair of a quadriceps tendon tear on 10/30.  At home on 11/4, she noted that her knee was discolored, swollen, warm and tender.  She had subjective fevers.  She began to feel her heart race and felt "weak."  She thought that she was back in atrial fibrillation.  She came to the ER and was noted to be in atrial fibrillation with RVR with fever and systolic BP in the 90s.  Her right knee was erythematous and swollen.  She was thought to have a superficial infection.  She was started on broad spectrum antibiotics and was given IV fluid.  Today, HR is controlled in the 80s.  She is still in atrial fibrillation.  She denies chest pain or dyspnea.  SBP is in the 90s to 100s.  Her hemoglobin has dropped considerably and Pradaxa has been held.   Review of systems complete and found to be negative unless listed above in HPI  Past Medical History: 1. 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  Removal of prosthesis with antibiotic spacer placement.  Recurrent infection with revision right TKR.  Next was found to have quadriceps tendon tear with repair on 10/30.  2. COPD 3. Atrial fibrillation:  Paroxysmal.  Has been cardioverted 4 times.  On amiodarone and Pradaxa at home.  Says she had atrial fibrillation ablation at [facility]  4. Diastolic CHF 5. Rheumatoid arthritis.  6. B12 deficiency 7. DVT right leg in past. 8. 2 cardiac caths without obstructive disease (per patient).  Last about 2 years ago.   Family history: Mother with CHF  Current Facility-Administered Medications  Medication Dose Route Frequency Provider Last Rate Last Dose  . 0.9 %  sodium chloride infusion   Intravenous Once [name] Oti 500 mL/hr at 12/15/10 0910    . acetaminophen (TYLENOL) tablet 650 mg  650 mg Oral Q6H PRN [name] Oti   650 mg at 12/14/10 1630   Or  . acetaminophen (TYLENOL) suppository 650 mg  650 mg Rectal Q6H PRN Isidor Holts      . alum & mag hydroxide-simeth (MAALOX/MYLANTA) 200-200-20 MG/5ML suspension 30 mL  30 mL Oral Q4H PRN Genelle Gather Babish, PA      . amiodarone (PACERONE) tablet 400 mg  400 mg Oral BID Marca Ancona, MD      . bisacodyl (DULCOLAX) EC tablet 10 mg  10 mg Oral Daily PRN Genelle Gather [place], PA       Or  . bisacodyl (DULCOLAX) suppository 10 mg  10 mg Rectal Daily PRN Genelle Gather Bab[place], PA      . digoxin (LANOXIN) injection 0.125 mg  0.125 mg Intravenous Once [name] Oti   0.125 mg at 12/14/10 1741  . digoxin (LANOXIN) tablet 0.125 mg  0.125 mg  Oral Daily Marca Ancona, MD      . docusate sodium (COLACE) capsule 100 mg  100 mg Oral BID Genelle Gather [place], PA   100 mg at 12/15/10 1030  . heparin injection 5,000 Units  5,000 Units Subcutaneous Q8H [name] Oti   5,000 Units at 12/15/10 1457  . HYDROcodone-acetaminophen (NORCO) 5-325 MG per tablet 1-2 tablet  1-2 tablet Oral Q4H PRN [name] Oti   2 tablet at 12/15/10 1338  . HYDROmorphone (DILAUDID) 2 MG/ML injection        1 mg at 12/14/10 1630  . HYDROmorphone (DILAUDID) injection 1 mg  1 mg Intravenous Q4H PRN [name] Oti   1 mg at 12/15/10 1114  . imipenem-cilastatin (PRIMAXIN) 500  mg in sodium chloride 0.9 % 100 mL IVPB  500 mg Intravenous Q6H Randall K Absher, PHARMD   500 mg at 12/15/10 1305  . menthol-cetylpyridinium (CEPACOL) lozenge 3 mg  1 lozenge Oral PRN Genelle Gather Babish, PA       Or  . phenol (CHLORASEPTIC) mouth spray 1 spray  1 spray Mouth/Throat PRN Genelle Gather Babish, PA      . metoCLOPramide (REGLAN) tablet 5-10 mg  5-10 mg Oral Q8H PRN Genelle Gather [place], PA       Or  . metoCLOPramide (REGLAN) injection 5-10 mg  5-10 mg Intravenous Q8H PRN Genelle Gather Babish, PA      . ondansetron [facility]) tablet 4 mg  4 mg Oral Q6H PRN Genelle Gather Babish, PA       Or  . ondansetron [facility]) injection 4 mg  4 mg Intravenous Q6H PRN Genelle Gather Babish, PA      . polyethylene glycol (MIRALAX / GLYCOLAX) packet 17 g  17 g Oral Daily Genelle Gather [place], [place]   17 g at 12/15/10 1028  . senna (SENOKOT) tablet 8.6 mg  1 tablet Oral BID AC Genelle Gather [place], PA   8.6 mg at 12/15/10 9604  . senna-docusate (Senokot-S) tablet 1 tablet  1 tablet Oral QHS PRN Genelle Gather [place], PA      . sodium chloride 0.9 % bolus 500 mL  500 mL Intravenous Once [name] Oti   500 mL at 12/15/10 1109  . sodium phosphate (FLEET) 7-19 GM/118ML enema 1 enema  1 enema Rectal Daily PRN Genelle Gather Babish, PA      . vancomycin (VANCOCIN) IVPB 1000 mg/200 mL premix  1,000 mg Intravenous Q12H Randall K Absher, PHARMD   1,000 mg at 12/15/10 1147  . zolpidem (AMBIEN) tablet 5 mg  5 mg Oral QHS PRN Genelle Gather Babish, PA      . DISCONTD: 0.9 %  sodium chloride infusion   Intravenous Continuous [name] Oti 100 mL/hr at 12/15/10 1400    . DISCONTD: 0.9 % NaCl with KCl 20 mEq/ L  infusion  100 mL/hr Intravenous Continuous Genelle Gather Babish, PA 100 mL/hr at 12/15/10 1000 100 mL/hr at 12/15/10 1000  . DISCONTD: aluminum hydroxide (ALTERNGEL) suspension 15-30 mL  15-30 mL Oral Q4H PRN Genelle Gather [place], PA      . DISCONTD: digoxin (LANOXIN) injection 0.125 mg  0.125 mg Intravenous  Daily [name] Oti   0.125 mg at 12/15/10 0915  . DISCONTD: heparin injection 5,000 Units  5,000 Units Subcutaneous Q8H [name] Oti   5,000 Units at 12/14/10 2203  . DISCONTD: rivaroxaban (XARELTO) tablet 10 mg  10 mg Oral Q24H Genelle Gather [place], [place]           History   Social  History  . Marital Status: Widowed    Spouse Name: N/A    Number of Children: N/A  . Years of Education: N/A  Lives in [place]. Occupational History  . Not on file.   Social History Main Topics  . Smoking status: Former Smoker -- 1.5 packs/day for 12 years    Types: Cigarettes    Quit date: 02/09/1996  . Smokeless tobacco: Never Used  . Alcohol Use: No  . Drug Use: No  . Sexually Active: No    Physical exam Blood pressure 105/51, pulse 88, temperature 97.9 F (36.6 C), temperature source Core (Comment), resp. rate 25, height 5\' 11"  (1.803 m), weight 84.5 kg (186 lb 4.6 oz), SpO2 100.00%. General: NAD Neck: JVP 8 cm, no thyromegaly or thyroid nodule.  Lungs: Mild bibasilar crackles. CV: Nondisplaced PMI.  Heart irregular S1/S2, no S3/S4, no murmur.  1+ ankle edema bilaterally.  No carotid bruit.   Abdomen: Soft, nontender, no hepatosplenomegaly, no distention.  Skin: Extensive erythema around right knee.   Neurologic: Alert and oriented x 3.  Psych: Normal affect. Extremities: No clubbing or cyanosis.  HEENT: Normal.  MSK: Right knee swollen and warm.   Labs:   Lab Results  Component Value Date   WBC 6.0 12/15/2010   HGB 8.3* 12/15/2010   HCT 23.6* 12/15/2010   MCV 107.3* 12/15/2010   PLT 235 12/15/2010    Lab 12/15/10 0218 12/14/10 1153  NA 129* --  K 3.7 --  CL 97 --  CO2 22 --  BUN 11 --  CREATININE 0.52 --  CALCIUM 8.4 --  PROT -- 5.7*  BILITOT -- 1.6*  ALKPHOS -- 97  ALT -- 11  AST -- 27  GLUCOSE 105* --   Lab Results  Component Value Date   CKTOTAL 79 12/15/2010   CKMB 1.4 12/15/2010   TROPONINI <0.30 12/15/2010      Radiology: CXR with no PNA or edema.  EKG:  Atrial fibrillation, LAFB, poor anterior R wave progression  ASSESSMENT AND PLAN: 65 yo with history of paroxysmal atrial fibrillation presented with recurrent atrial fibrillation in RVR and high fever + cellulitis overlying right knee.   1. Atrial fibrillation: HR is controlled in the 80s-100s on telemetry. She does not like atrial fibrillation (feels uncomfortable).  Blood pressure is reasonable in the high 90s-100s systolic. I suspect atrial fibrillation was triggered by the infection.   - Agree with holding Pradaxa given fall in hemoglobin.  Would restart when hemoglobin has stabilized and it is determined that she is not actively losing blood.  - Change digoxin to po. - Would restart her amiodarone at 400 mg bid for now.  She may go back into sinus rhythm.  Otherwise, it will aid with rate control and should not significant affect her blood pressure.  - Echocardiogram 2. ID: Cellulitis overlying right knee with fever, early sepsis.  Broad spectrum antibiotics.  BP is stable now and JVP is around 8 on exam.  I think she can be KVO'd.  She has a history of diastolic CHF.  Would hold off on standing fluids unless systolic BP falls below the 90s.   Signed: Marca Ancona 12/15/2010, 3:26 PM

## 2010-12-16 DIAGNOSIS — I517 Cardiomegaly: Secondary | ICD-10-CM

## 2010-12-16 DIAGNOSIS — M79609 Pain in unspecified limb: Secondary | ICD-10-CM

## 2010-12-16 DIAGNOSIS — M7989 Other specified soft tissue disorders: Secondary | ICD-10-CM

## 2010-12-16 HISTORY — PX: TRANSTHORACIC ECHOCARDIOGRAM: SHX275

## 2010-12-16 LAB — BASIC METABOLIC PANEL
Chloride: 95 mEq/L — ABNORMAL LOW (ref 96–112)
GFR calc Af Amer: >90 mL/min (ref >90)
Potassium: 3.5 mEq/L (ref 3.5–5.1)
Sodium: 126 mEq/L — ABNORMAL LOW (ref 135–145)

## 2010-12-16 LAB — URINE CULTURE: Colony Count: >=100000

## 2010-12-16 LAB — VANCOMYCIN, TROUGH: Vancomycin Tr: 17.8 ug/mL (ref 10.0–20.0)

## 2010-12-16 LAB — CBC
Platelets: 244 10*3/uL (ref 150–400)
RBC: 2.12 MIL/uL — ABNORMAL LOW (ref 3.87–5.11)
RDW: 13.2 % (ref 11.5–15.5)
WBC: 8.9 10*3/uL (ref 4.0–10.5)

## 2010-12-16 MED ORDER — SODIUM CHLORIDE 0.9 % IV SOLN
INTRAVENOUS | Status: DC
  Administered 2010-12-16: 08:00:00 via INTRAVENOUS
  Administered 2010-12-16: 20 mL via INTRAVENOUS

## 2010-12-16 MED ORDER — FUROSEMIDE 10 MG/ML IJ SOLN
20.0000 mg | Freq: Once | INTRAMUSCULAR | Status: AC
  Administered 2010-12-17: 20 mg via INTRAVENOUS

## 2010-12-16 NOTE — Consult Note (Signed)
WOC consult Note Reason for Consult: necrotic area right knee Wound type: necrosis of unknown etiology Measurement: Not taken as Dr. Hart Rochester with patient at this time. Wound bed: 100% necrotic Drainage (amount, consistency, odor) serosanguinous Periwound: macerated Dressing procedure/placement/frequency: Recommend non-adherent dressing (iodoform) topped with gauze, abd and secured with Kerlix.  Change twice daily.  This wound appears to need surgical intervention. Other than topical conservative care, this exceeds my level of expertise.  I will not follow.  Please re-consult if needed. Thanks, Ladona Mow, MSN, RN, [facility], CWOCN [phone])

## 2010-12-16 NOTE — Progress Notes (Signed)
ANTIBIOTIC CONSULT NOTE - FOLLOW UP  Pharmacy Consult for Vancomycin Indication: Suspected Knee joint Infection with superficial gangrenous changes in the skin adjacent to knee incision.  Allergies  Allergen Reactions  . Sulfa Antibiotics Shortness Of Breath  . Avelox (Moxifloxacin Hcl In Nacl)   . Penicillins   . Robaxin Nausea And Vomiting    Patient Measurements: Height: 5\' 11"  (180.3 cm) Weight: 194 lb 14.2 oz (88.4 kg) IBW/kg (Calculated) : 70.8    Vital Signs: Temp: 99 F (37.2 C) (11/07 2000) Temp src: Core (Comment) (11/07 2000) BP: 100/58 mmHg (11/07 2000) Pulse Rate: 81  (11/07 2000) Intake/Output from previous day: 11/06 0701 - 11/07 0700 In: 3000 [P.O.:480; I.V.:1520; IV Piggyback:1000] Out: 915 [Urine:915] Intake/Output from this shift: Total I/O In: 60 [I.V.:60] Out: 1 [Stool:1]  Labs:  [facility] 12/16/10 0308 12/15/10 0218 12/14/10 1153  WBC 8.9 6.0 7.5  HGB 7.9* 8.3* 9.3*  PLT 244 235 261  LABCREA -- -- --  CREATININE 0.47* 0.52 0.52   Estimated Creatinine Clearance: 86.1 ml/min (by C-G formula based on Cr of 0.47).  Basename 12/16/10 2210  VANCOTROUGH 17.8  VANCOPEAK --  VANCORANDOM --  GENTTROUGH --  GENTPEAK --  GENTRANDOM --  TOBRATROUGH --  TOBRAPEAK --  TOBRARND --  AMIKACINPEAK --  AMIKACINTROU --  AMIKACIN --     Microbiology: Recent Results (from the past 720 hour(s))  SURGICAL PCR SCREEN     Status: Normal   Collection Time   12/08/10 11:45 AM      Component Value Range Status Comment   MRSA, PCR NEGATIVE  NEGATIVE  Final    Staphylococcus aureus NEGATIVE  NEGATIVE  Final   CULTURE, BLOOD (ROUTINE X 2)     Status: Normal (Preliminary result)   Collection Time   12/14/10  3:15 AM      Component Value Range Status Comment   Specimen Description BLOOD LEFT ARM   Final    Special Requests BOTTLES DRAWN AEROBIC AND ANAEROBIC 5CC   Final    Setup Time [id number]   Final    Culture     Final    Value:        BLOOD CULTURE  RECEIVED NO GROWTH TO DATE CULTURE WILL BE HELD FOR 5 DAYS BEFORE ISSUING A FINAL NEGATIVE REPORT   Report Status PENDING   Incomplete   URINE CULTURE     Status: Normal   Collection Time   12/14/10  3:32 AM      Component Value Range Status Comment   Specimen Description URINE, CATHETERIZED   Final    Special Requests NONE   Final    Setup Time [id number]   Final    Colony Count >=100,000 COLONIES/ML   Final    Culture ESCHERICHIA COLI   Final    Report Status 12/16/2010 FINAL   Final    Organism ID, Bacteria ESCHERICHIA COLI   Final     Anti-infectives    None      Assessment: Trough = 17.8 mg/L   Goal of Therapy:  Vancomycin trough level 15-20 mcg/ml  Plan:  Continue Vancomycin 1Gm IV q12h F/U SCr/Levels as needed  [name] R 12/16/2010,11:31 PM

## 2010-12-16 NOTE — Progress Notes (Signed)
Pt with infection of R LE and to be seen by Dr. Charlann Boxer later today.  Will await ortho MD visit and specifications of any mobility restrictions prior to performing PT evaluation.

## 2010-12-16 NOTE — Consult Note (Signed)
[facility] ADMIT/CONSULT NOTE 12/16/2010 [id number]  CC:Dr. Odi  And Dr. Lajoyce Corners Referring Physician:Dr Odi  History of Present Illness: This 65 year old female recently had a revision of her right knee surgery performed by Dr.:Olin on 12/08/2010. She was discharged on 12/11/2010 and readmitted on 12/14/2010 with swelling, pain, redness, and blistering around her right knee. Vascular r consultation was obtained today. She denies a previous history of lower extremity arterial insufficiency, gangrene, infection of the feet. She does not have diabetes mellitus. She had a previous right knee replacement in December of 2011. She has a history of DVT in June of 2012 in the right leg.  Past Medical History  Diagnosis Date  . Emphysema   . Coronary artery disease   . Atrial fibrillation   . COPD (chronic obstructive pulmonary disease)   . History of atrial fibrillation     S/pP Ablation  . Asthma   . Chronic kidney disease     HX OF UTI  . Arthritis   . Osteoarthritis   . Rheumatoid arthritis   . Anemia   . B12 deficiency anemia   . Stroke     slight stroke in 2009-no deficits  . Neuromuscular disorder     hx of right quadricep rupture  . DJD (degenerative joint disease)   . History of benign bladder tumor     ROS: [x]  Positive   [ ]  Negative   [ ]  All sytems reviewed and are negative  General: [ ]  Weight loss, [+ ] Fever,+  ] chills Neurologic: [ ]  Dizziness, [ ]  Blackouts, [ ]  Seizure [+Stroke, [ ]  "Mini stroke", [ ]  Slurred speech, [ ]  Temporary blindness; [ ]  weakness in arms or legs, [ ]  Hoarseness Cardiac: [ ]  Chest pain/pressure, [ ]  Shortness of breath at rest [ ]  Shortness of breath with exertion, [+ ] Atrial fibrillation or irregular heartbeat Vascular: [ ]  Pain in legs with walking, [ ]  Pain in legs at rest, [ ]  Pain in legs at night,  [ ]  Non-healing ulcer, [+ ] Blood clot in vein/DVT,   Pulmonary: [ ]  Home oxygen, [ ]  Productive  cough, [ ]  Coughing up blood, + Asthma,  [ ]  Wheezing Musculoskeletal:  [ ]  Arthritis, [ ]  Low back pain, [+ ] Joint pain Hematologic: [ ]  Easy Bruising, [ ]  Anemia; [ ]  Hepatitis Gastrointestinal: [ ]  Blood in stool, [ ]  Gastroesophageal Reflux/heartburn, [ ]  Trouble swallowing Urinary: [ ]  chronic Kidney disease, [ ]  on HD - [ ]  MWF or [ ]  TTHS, [ ]  Burning with urination, [ ]  Difficulty urinating Skin: [ ]  Rashes, [ ]  Wounds Psychological: [ ]  Anxiety, [ ]  Depression   Social History History  Substance Use Topics  . Smoking status: Former Smoker -- 1.5 packs/day for 12 years    Types: Cigarettes    Quit date: 02/09/1996  . Smokeless tobacco: Never Used  . Alcohol Use: No    Family History Family History  Problem Relation Age of Onset  . Achondroplasia Father   . Congenital heart disease Mother     Allergies  Allergen Reactions  . Sulfa Antibiotics Shortness Of Breath  . Avelox (Moxifloxacin Hcl In Nacl)   . Penicillins   . Robaxin Nausea And Vomiting    Current Facility-Administered Medications  Medication Dose Route Frequency Provider Last Rate Last Dose  . 0.9 %  sodium chloride infusion   Intravenous Continuous [name] Oti 20 mL/hr at 12/16/10 1000    .  acetaminophen (TYLENOL) tablet 650 mg  650 mg Oral Q6H PRN [name] Oti   650 mg at 12/14/10 1630   Or  . acetaminophen (TYLENOL) suppository 650 mg  650 mg Rectal Q6H PRN Isidor Holts      . alum & mag hydroxide-simeth (MAALOX/MYLANTA) 200-200-20 MG/5ML suspension 30 mL  30 mL Oral Q4H PRN Genelle Gather Babish, PA      . amiodarone (PACERONE) tablet 400 mg  400 mg Oral BID Marca Ancona, MD   400 mg at 12/16/10 0953  . bisacodyl (DULCOLAX) EC tablet 10 mg  10 mg Oral Daily PRN Genelle Gather Babish, PA       Or  . bisacodyl (DULCOLAX) suppository 10 mg  10 mg Rectal Daily PRN Genelle Gather Babish, PA      . digoxin Margit Banda) tablet 0.125 mg  0.125 mg Oral Daily Marca Ancona, MD   0.125 mg at 12/16/10 0954   . docusate sodium (COLACE) capsule 100 mg  100 mg Oral BID Genelle Gather [place], PA   100 mg at 12/16/10 0954  . heparin injection 5,000 Units  5,000 Units Subcutaneous Q8H [name] Oti   5,000 Units at 12/16/10 1425  . HYDROcodone-acetaminophen (NORCO) 5-325 MG per tablet 1-2 tablet  1-2 tablet Oral Q4H PRN [name] Oti   2 tablet at 12/16/10 1423  . HYDROmorphone (DILAUDID) injection 1 mg  1 mg Intravenous Q4H PRN [name] Oti   1 mg at 12/16/10 0110  . imipenem-cilastatin (PRIMAXIN) 500 mg in sodium chloride 0.9 % 100 mL IVPB  500 mg Intravenous Q6H Randall K Absher, PHARMD   500 mg at 12/16/10 1245  . menthol-cetylpyridinium (CEPACOL) lozenge 3 mg  1 lozenge Oral PRN Genelle Gather Babish, PA       Or  . phenol (CHLORASEPTIC) mouth spray 1 spray  1 spray Mouth/Throat PRN Genelle Gather Babish, PA      . metoCLOPramide (REGLAN) tablet 5-10 mg  5-10 mg Oral Q8H PRN Genelle Gather [place], PA       Or  . metoCLOPramide (REGLAN) injection 5-10 mg  5-10 mg Intravenous Q8H PRN Genelle Gather Babish, PA      . ondansetron [facility]) tablet 4 mg  4 mg Oral Q6H PRN Genelle Gather Babish, PA       Or  . ondansetron [facility]) injection 4 mg  4 mg Intravenous Q6H PRN Genelle Gather Babish, PA      . polyethylene glycol (MIRALAX / GLYCOLAX) packet 17 g  17 g Oral Daily Genelle Gather [place], [place]   17 g at 12/16/10 0955  . senna (SENOKOT) tablet 8.6 mg  1 tablet Oral BID AC Genelle Gather [place], PA   8.6 mg at 12/15/10 1700  . senna-docusate (Senokot-S) tablet 1 tablet  1 tablet Oral QHS PRN Gerrit Halls, PA   1 tablet at 12/16/10 0831  . sodium phosphate (FLEET) 7-19 GM/118ML enema 1 enema  1 enema Rectal Daily PRN Genelle Gather Babish, PA      . vancomycin (VANCOCIN) IVPB 1000 mg/200 mL premix  1,000 mg Intravenous Q12H Randall K Absher, PHARMD   1,000 mg at 12/16/10 1147  . zolpidem (AMBIEN) tablet 5 mg  5 mg Oral QHS PRN Genelle Gather Babish, PA      . DISCONTD: 0.9 %  sodium chloride infusion    Intravenous Continuous [name] Oti 100 mL/hr at 12/15/10 1400    . DISCONTD: digoxin (LANOXIN) injection 0.125 mg  0.125 mg Intravenous Daily [name] Oti   0.125 mg  at 12/15/10 0915     Physical Examination  Filed Vitals:   12/16/10 1400  BP: 103/56  Pulse: 75  Temp: 99.3 F (37.4 C)  Resp: 20    General:  WDWN in NAD HENT: WNL Eyes: Pupils equal Pulmonary: normal non-labored breathing , without Rales, rhonchi,  wheezing Cardiac: RRR, without  Murmurs, rubs or gallops; No carotid bruits Abdomen: soft, NT, no masses Skin: no rashes, ulcers she has extensive superficial skin necrosis adjacent to her longitudinal right knee incision. This involves an area about 8 x 10 cm. There is some purulent drainage which I can express from this area palpating the popliteal fossa. Vascular Exam/Pulses: She has 3+ femoral popliteal and 2+ dorsalis pedis pulse palpable on the left. She has 3+ femoral and 1-2+ dorsalis pedis pulse palpable on the right. Right foot is pink and well-perfused. She has changes consistent with chronic venous insufficiency with hyperpigmentation of the skin.  Extremities without ischemic changes,;  Musculoskeletal: no muscle wasting or atrophy  Neurologic: A&O X 3; Appropriate Affect ;  SENSATION: normal; MOTOR FUNCTION:  moving all extremities equally.  Speech is fluent/normal  Non-Invasive Vascular Imaging: Negative for DVT in right leg on the exam performed yesterday. Arterial exam today reveals biphasic and triphasic flow throughout the right lower extremity with no evidence of significant arterial insufficiency. She could not tolerate ADIs because of the pain. ASSESSMENT: Suspect knee joint infection with superficial gangrenous changes in the skin adjacent to longitudinal knee incision PLAN: No need for further vascular evaluation. Dr. Lajoyce Corners to further evaluate patient today.

## 2010-12-16 NOTE — Progress Notes (Signed)
Dr. Hart Rochester in to see pt and examined wound on rt knee. Dr. Brien Few called for Dr. Hart Rochester to speak to by phone. Dr Brien Few informed that pt has not been seen by Dr. Charlann Boxer since admission and that pt states that she wants to go to [facility] tomorrow if she is not seen by her orthopedic MD. Anthonette Legato

## 2010-12-16 NOTE — Plan of Care (Signed)
Problem: Phase I Progression Outcomes Goal: Pain controlled with appropriate interventions Outcome: Progressing With ordered meds.

## 2010-12-16 NOTE — Progress Notes (Addendum)
Preliminary results. Right lower extremity arterial duplex revealed mild wall changes throughout the thigh. There is no evidence of significant stenosis throughout the lower extremity. Doppler waveforms are triphasic to monophasic throughout.  Claude Swendsen, [place] D 12/16/2010, 3:39 PM

## 2010-12-16 NOTE — Progress Notes (Signed)
Subjective:  No chest pain, dyspnea, palps.   Objective:  Vital Signs in the last 24 hours: Temp:  [97.9 F (36.6 C)-99.9 F (37.7 C)] 99.9 F (37.7 C) (11/07 0400) Pulse Rate:  [80-95] 82  (11/07 0400) Resp:  [17-25] 21  (11/07 0400) BP: (93-109)/(45-64) 95/50 mmHg (11/07 0400) SpO2:  [97 %-100 %] 100 % (11/07 0400) Weight:  [88.4 kg (194 lb 14.2 oz)] 194 lb 14.2 oz (88.4 kg) (11/07 0600)  Intake/Output from previous day: 11/06 0701 - 11/07 0700 In: 2980 [P.O.:480; I.V.:1500; IV Piggyback:1000] Out: 915 [Urine:915]  Physical Exam: Pt is alert and oriented, NAD HEENT: normal Neck: JVP - normal, carotids 2+= without bruits Lungs: Bilateral exp rhonchi, no crackles CV: irregularly irregular without murmur or gallop Abd: soft, NT, Positive BS, no hepatomegaly Ext: no pretibial edema  Lab Results:  Basename 12/16/10 0308 12/15/10 0218  WBC 8.9 6.0  HGB 7.9* 8.3*  PLT 244 235    Basename 12/16/10 0308 12/15/10 0218  NA 126* 129*  K 3.5 3.7  CL 95* 97  CO2 24 22  GLUCOSE 125* 105*  BUN 9 11  CREATININE 0.47* 0.52    Basename 12/15/10 0218 12/14/10 1900  TROPONINI <0.30 <0.30   Hepatic Function Panel  Basename 12/14/10 1153  PROT 5.7*  ALBUMIN 2.4*  AST 27  ALT 11  ALKPHOS 97  BILITOT 1.6*  BILIDIR --  IBILI --   No results found for this basename: CHOL in the last 72 hours No results found for this basename: PROTIME in the last 72 hours  Tele: Atrial fib HR 80-90's, no other significant arrhythmia  Assessment/Plan:  Atrial fib, currently with good rate control on amiodarone 400 mg bid and digoxin. Would continue same. For anticoagulation, pt on DVT prophylaxis dose heparin. This is reasonable in postoperative setting with significant anemia as bleeding risk likely outweighs short-term risk of thromboembolism. Will follow with you, no cardiac med changes recommended today.   Tonny Bollman, M.D. 12/16/2010, 7:05 AM

## 2010-12-16 NOTE — Progress Notes (Signed)
Orthopedic Tech Progress Note Patient Details:  [name] 07-10-1945 [id number]  I was Called for overhead frame by unit there was no order nurse confirmed for frame I applied  Jenness Corner 12/16/2010, 1:46 PM

## 2010-12-16 NOTE — Progress Notes (Signed)
  [name] was seen and examined tonight.  She remains in good spirits despite current events.  She is current HD stable.    She is on Vanc and Zosyn.  Exam:  Her knee has gotten significantly worse since admission.  Since her last admission to no it is very difficult to explain what is going on with her knee.   Her blistered skin is now filled with pus.  Diagnosis:   Chronically infected right total knee replacement, with a significant compromise of her overlying skin and soft tissue.   After lengthy discussion and review of options, I do not see hoe there could be any attempt at salvage at this point.  Even with multiple surgeries her best result pending no further complications would be a fused knee.   Given this we have decided to proceed with a RIGHT AKA.  NPO after MN  Consent on chart, stop heparin   Surgery most likely to be scheduled for tomorrow to prevent possible spread and further damage.  I reviewed with her that I may need to place a wound VAC over the amputated stump to assist with healing.  Note that she was afraid that this may have come to this and had been thinking about it today, I just dont see any alternative.

## 2010-12-16 NOTE — Progress Notes (Signed)
  Echocardiogram 2D Echocardiogram has been performed.  Juanita Laster Bren Borys, RDCS 12/16/2010, 10:32 AM

## 2010-12-16 NOTE — Progress Notes (Signed)
ot note  Will await PT eval to see if pt has any OT needs.  Pt was recently admitted with TKA and was doing well at time of discharge.

## 2010-12-16 NOTE — Progress Notes (Addendum)
Subjective: Has pain right leg, spiked temp through current antibiotics  Objective: Vital signs in last 24 hours: Temp:  [98.4 F (36.9 C)-101.5 F (38.6 C)] 101.5 F (38.6 C) (11/07 0800) Pulse Rate:  [80-95] 88  (11/07 0800) Resp:  [17-26] 26  (11/07 0800) BP: (93-109)/(45-63) 98/49 mmHg (11/07 0800) SpO2:  [95 %-100 %] 95 % (11/07 0800) Weight:  [88.4 kg (194 lb 14.2 oz)] 194 lb 14.2 oz (88.4 kg) (11/07 0600) Weight change: 3.9 kg (8 lb 9.6 oz) Last BM Date: 12/14/10  Intake/Output from previous day: 11/06 0701 - 11/07 0700 In: 2980 [P.O.:480; I.V.:1500; IV Piggyback:1000] Out: 915 [Urine:915] Total I/O In: -  Out: 185 [Urine:185]   Physical Exam: General: Comfortable, alert, communicative, fully oriented, not short of breath at rest.  HEENT: Moderate clinical pallor, no jaundice, no conjunctival injection or discharge.  NECK: Supple, JVP not seen, no carotid bruits, no palpable lymphadenopathy, no palpable goiter.  CHEST: Clinically clear to auscultation, no wheezes, no crackles.  HEART: Sounds 1 and 2 heard, normal, irregular, no murmurs.  ABDOMEN: Soft, non-tender, no palpable organomegaly, no palpable masses, normal bowel sounds.  LOWER EXTREMITIES: Edema, RLE is less, at least below the knee. The knee itself is under blood-stained dressings. Inflammatory phenomena appears to be improved. Rt thigh is still swollen, albeit marginally, less tense. Pul;ses are elicitable by doppler. MUSCULOSKELETAL SYSTEM: Generalized osteoarthritic changes, otherwise, normal.  CENTRAL NERVOUS SYSTEM: No focal neurologic deficit on gross examination.  Lab Results:  Basename 12/16/10 0308 12/15/10 0218  WBC 8.9 6.0  HGB 7.9* 8.3*  HCT 22.5* 23.6*  PLT 244 235    Basename 12/16/10 0308 12/15/10 0218  NA 126* 129*  K 3.5 3.7  CL 95* 97  CO2 24 22  GLUCOSE 125* 105*  BUN 9 11  CREATININE 0.47* 0.52  CALCIUM 8.1* 8.4   Recent Results (from the past 240 hour(s))  SURGICAL PCR  SCREEN     Status: Normal   Collection Time   12/08/10 11:45 AM      Component Value Range Status Comment   MRSA, PCR NEGATIVE  NEGATIVE  Final    Staphylococcus aureus NEGATIVE  NEGATIVE  Final   CULTURE, BLOOD (ROUTINE X 2)     Status: Normal (Preliminary result)   Collection Time   12/14/10  3:15 AM      Component Value Range Status Comment   Specimen Description BLOOD LEFT ARM   Final    Special Requests BOTTLES DRAWN AEROBIC AND ANAEROBIC 5CC   Final    Setup Time [id number]   Final    Culture     Final    Value:        BLOOD CULTURE RECEIVED NO GROWTH TO DATE CULTURE WILL BE HELD FOR 5 DAYS BEFORE ISSUING A FINAL NEGATIVE REPORT   Report Status PENDING   Incomplete   URINE CULTURE     Status: Normal   Collection Time   12/14/10  3:32 AM      Component Value Range Status Comment   Specimen Description URINE, CATHETERIZED   Final    Special Requests NONE   Final    Setup Time [id number]   Final    Colony Count >=100,000 COLONIES/ML   Final    Culture ESCHERICHIA COLI   Final    Report Status 12/16/2010 FINAL   Final    Organism ID, Bacteria ESCHERICHIA COLI   Final      Studies/Results: No results found.  Medications: Scheduled  Meds:   . amiodarone  400 mg Oral BID  . digoxin  0.125 mg Oral Daily  . docusate sodium  100 mg Oral BID  . heparin subcutaneous  5,000 Units Subcutaneous Q8H  . imipenem-cilastatin  500 mg Intravenous Q6H  . polyethylene glycol  17 g Oral Daily  . senna  1 tablet Oral BID AC  . sodium chloride  500 mL Intravenous Once  . vancomycin  1,000 mg Intravenous Q12H  . DISCONTD: digoxin  0.125 mg Intravenous Daily   Continuous Infusions:   . sodium chloride    . DISCONTD: sodium chloride 100 mL/hr at 12/15/10 1400  . DISCONTD: 0.9 % NaCl with KCl 20 mEq / L 100 mL/hr (12/15/10 1000)   PRN Meds:.acetaminophen, acetaminophen, alum & mag hydroxide-simeth, bisacodyl, bisacodyl, HYDROcodone-acetaminophen, HYDROmorphone, menthol-cetylpyridinium,  metoCLOPramide (REGLAN) injection, metoCLOPramide, ondansetron (ZOFRAN) IV, ondansetron, phenol, senna-docusate, sodium phosphate, zolpidem  Assessment/Plan:  Principal Problem:  *Atrial fibrillation with RVR: Controlled with a combination of Amiodarone/Digoxin, as recommended by Dr Excell Seltzer, cardiologist. Not on Xarelto at this time. Apparently, was not on Pradaxa, either. Active Problems: 1. Hypotension, unspecified: BP stable with SBP in the 90s. Patient appears comfortable. Perhaps, this is her baseline. ivi fluids Dc-ed on Cardiology recommendations.  2. Cellulitis right leg. Tardy response to broad spectrum antibiotics. Will continue for now, but patient's vasculature may need evaluation. Preliminary report for RLE venous doppler done 12/15/10, shows no DVT or fluid collection in the thigh, although there is interstitial edema. Will consult vascular surgeon. 3. History of septic joint: Discussed again with Dr Charlann Boxer this morning. He will review later today, but appears convinced that Rt knee is not probematic.  Note: Background problems remain stable.   LOS: 2 days   [name],[name] 12/16/2010, 10:30 AM    Urine culture shows E. Coli. This is covered by current antibiotic choice. Hb is trending down. May need blood transfusion.

## 2010-12-17 ENCOUNTER — Encounter (HOSPITAL_COMMUNITY)
Admission: EM | Disposition: A | Discharge status: Skilled Nursing Facility | Payer: Self-pay | Source: Home / Self Care | Attending: Internal Medicine

## 2010-12-17 ENCOUNTER — Inpatient Hospital Stay (HOSPITAL_COMMUNITY): Payer: Medicare Other | Admitting: Anesthesiology

## 2010-12-17 ENCOUNTER — Encounter (HOSPITAL_COMMUNITY): Payer: Self-pay | Admitting: Anesthesiology

## 2010-12-17 HISTORY — PX: AMPUTATION: SHX166

## 2010-12-17 HISTORY — PX: APPLICATION OF WOUND VAC: SHX5189

## 2010-12-17 LAB — COMPREHENSIVE METABOLIC PANEL
AST: 51 U/L — ABNORMAL HIGH (ref 0–37)
CO2: 24 mEq/L (ref 19–32)
Calcium: 8.3 mg/dL — ABNORMAL LOW (ref 8.4–10.5)
Creatinine, Ser: 0.47 mg/dL — ABNORMAL LOW (ref 0.50–1.10)
GFR calc non Af Amer: >90 mL/min (ref >90)

## 2010-12-17 LAB — CBC
MCH: 37.7 pg — ABNORMAL HIGH (ref 26.0–34.0)
MCHC: 35.7 g/dL (ref 30.0–36.0)
MCV: 105.6 fL — ABNORMAL HIGH (ref 78.0–100.0)
Platelets: 250 10*3/uL (ref 150–400)
RBC: 2.15 MIL/uL — ABNORMAL LOW (ref 3.87–5.11)
RDW: 13.3 % (ref 11.5–15.5)

## 2010-12-17 SURGERY — AMPUTATION, ABOVE KNEE
Anesthesia: General | Site: Leg Upper | Laterality: Right | Wound class: Dirty or Infected

## 2010-12-17 MED ORDER — LACTATED RINGERS IV SOLN
INTRAVENOUS | Status: DC | PRN
  Administered 2010-12-17 (×2): via INTRAVENOUS

## 2010-12-17 MED ORDER — MIDAZOLAM HCL 5 MG/5ML IJ SOLN
INTRAMUSCULAR | Status: DC | PRN
  Administered 2010-12-17: 2 mg via INTRAVENOUS

## 2010-12-17 MED ORDER — HYDROMORPHONE HCL PF 1 MG/ML IJ SOLN
INTRAMUSCULAR | Status: AC
Start: 2010-12-17 — End: 2010-12-17
  Administered 2010-12-17: 0.5 mg via INTRAVENOUS
  Filled 2010-12-17: qty 1

## 2010-12-17 MED ORDER — POTASSIUM CHLORIDE IN NACL 20-0.9 MEQ/L-% IV SOLN
INTRAVENOUS | Status: AC
  Administered 2010-12-18: 100 mL/h via INTRAVENOUS
  Filled 2010-12-17: qty 1000

## 2010-12-17 MED ORDER — PROPOFOL 10 MG/ML IV EMUL
INTRAVENOUS | Status: DC | PRN
  Administered 2010-12-17: 150 mg via INTRAVENOUS

## 2010-12-17 MED ORDER — FENTANYL CITRATE 0.05 MG/ML IJ SOLN
INTRAMUSCULAR | Status: DC | PRN
  Administered 2010-12-17: 50 ug via INTRAVENOUS
  Administered 2010-12-17 (×2): 100 ug via INTRAVENOUS

## 2010-12-17 MED ORDER — HYDROMORPHONE HCL PF 1 MG/ML IJ SOLN
0.2500 mg | INTRAMUSCULAR | Status: DC | PRN
  Administered 2010-12-17: 0.5 mg via INTRAVENOUS
  Administered 2010-12-17 (×2): 0.25 mg via INTRAVENOUS

## 2010-12-17 MED ORDER — VITAMINS A & D EX OINT
TOPICAL_OINTMENT | CUTANEOUS | Status: AC
  Administered 2010-12-17: 5
  Filled 2010-12-17: qty 10

## 2010-12-17 MED ORDER — FUROSEMIDE 10 MG/ML IJ SOLN
INTRAMUSCULAR | Status: AC
  Administered 2010-12-17: 20 mg via INTRAVENOUS
  Filled 2010-12-17: qty 4

## 2010-12-17 MED ORDER — PROMETHAZINE HCL 25 MG/ML IJ SOLN: 6.2500 mg | INTRAMUSCULAR | Status: DC | PRN

## 2010-12-17 MED ORDER — LACTATED RINGERS IV SOLN
INTRAVENOUS | Status: DC
  Administered 2010-12-17: 1000 mL via INTRAVENOUS

## 2010-12-17 MED ORDER — POTASSIUM CHLORIDE IN NACL 20-0.9 MEQ/L-% IV SOLN
INTRAVENOUS | Status: DC
  Administered 2010-12-18: 100 mL/h via INTRAVENOUS
  Administered 2010-12-18: 14:00:00 via INTRAVENOUS
  Administered 2010-12-19: 100 mL/h via INTRAVENOUS
  Administered 2010-12-19 – 2010-12-20 (×2): via INTRAVENOUS
  Filled 2010-12-17 (×7): qty 1000

## 2010-12-17 MED ORDER — ENOXAPARIN SODIUM 40 MG/0.4ML ~~LOC~~ SOLN
40.0000 mg | Freq: Every day | SUBCUTANEOUS | Status: DC
  Administered 2010-12-18 – 2010-12-21 (×4): 40 mg via SUBCUTANEOUS
  Filled 2010-12-17 (×5): qty 0.4

## 2010-12-17 MED ORDER — SODIUM CHLORIDE 0.9 % IR SOLN
Status: DC | PRN
  Administered 2010-12-17: 1000 mL
  Administered 2010-12-17: 6000 mL

## 2010-12-17 MED ORDER — SODIUM CHLORIDE 0.9 % IR SOLN
Status: DC | PRN
  Administered 2010-12-17: 1000 mL

## 2010-12-17 SURGICAL SUPPLY — 54 items
BAG ZIPLOCK 12X15 (MISCELLANEOUS) ×9 IMPLANT
BANDAGE ELASTIC 6 VELCRO ST LF (GAUZE/BANDAGES/DRESSINGS) ×3 IMPLANT
BANDAGE GAUZE ELAST BULKY 4 IN (GAUZE/BANDAGES/DRESSINGS) ×9 IMPLANT
BIT DRILL 2.8X5 QR DISP (BIT) ×3 IMPLANT
BLADE SURG SZ10 CARB STEEL (BLADE) ×6 IMPLANT
CLOTH BEACON ORANGE TIMEOUT ST (SAFETY) ×3 IMPLANT
COVER MAYO STAND STRL (DRAPES) ×6 IMPLANT
CUFF TOURN SGL QUICK 18 (TOURNIQUET CUFF) IMPLANT
CUFF TOURN SGL QUICK 34 (TOURNIQUET CUFF) ×1
CUFF TRNQT CYL 34X4X40X1 (TOURNIQUET CUFF) ×2 IMPLANT
DRAPE BACK TABLE (DRAPES) ×3 IMPLANT
DRAPE SURG 17X11 SM STRL (DRAPES) IMPLANT
DRAPE U-SHAPE 47X51 STRL (DRAPES) ×3 IMPLANT
DRSG VAC ATS SM SENSATRAC (GAUZE/BANDAGES/DRESSINGS) ×3 IMPLANT
DURAPREP 26ML APPLICATOR (WOUND CARE) ×3 IMPLANT
ELECT REM PT RETURN 9FT ADLT (ELECTROSURGICAL) ×3
ELECTRODE REM PT RTRN 9FT ADLT (ELECTROSURGICAL) ×2 IMPLANT
GLOVE BIOGEL PI IND STRL 7.5 (GLOVE) ×2 IMPLANT
GLOVE BIOGEL PI IND STRL 8 (GLOVE) ×2 IMPLANT
GLOVE BIOGEL PI IND STRL 8.5 (GLOVE) ×4 IMPLANT
GLOVE BIOGEL PI INDICATOR 7.5 (GLOVE) ×1
GLOVE BIOGEL PI INDICATOR 8 (GLOVE) ×1
GLOVE BIOGEL PI INDICATOR 8.5 (GLOVE) ×2
GLOVE ECLIPSE 8.0 STRL XLNG CF (GLOVE) IMPLANT
GLOVE ECLIPSE 8.5 STRL (GLOVE) ×3 IMPLANT
GLOVE ORTHO TXT STRL SZ7.5 (GLOVE) ×6 IMPLANT
GLOVE SURG ORTHO 8.0 STRL STRW (GLOVE) ×3 IMPLANT
GLOVE SURG SS PI 6.5 STRL IVOR (GLOVE) ×6 IMPLANT
GOWN BRE IMP SLV AUR XL STRL (GOWN DISPOSABLE) ×9 IMPLANT
GOWN STRL NON-REIN LRG LVL3 (GOWN DISPOSABLE) ×3 IMPLANT
HANDPIECE INTERPULSE COAX TIP (DISPOSABLE) ×2
IV NS IRRIG 3000ML ARTHROMATIC (IV SOLUTION) ×6 IMPLANT
KIT BASIN OR (CUSTOM PROCEDURE TRAY) ×3 IMPLANT
MANIFOLD NEPTUNE II (INSTRUMENTS) ×3 IMPLANT
NS IRRIG 1000ML POUR BTL (IV SOLUTION) ×3 IMPLANT
PACK LOWER EXTREMITY WL (CUSTOM PROCEDURE TRAY) ×3 IMPLANT
PAD CAST 4YDX4 CTTN HI CHSV (CAST SUPPLIES) IMPLANT
PADDING CAST COTTON 4X4 STRL (CAST SUPPLIES)
POSITIONER SURGICAL ARM (MISCELLANEOUS) ×6 IMPLANT
SET HNDPC FAN SPRY TIP SCT (DISPOSABLE) ×4 IMPLANT
SHIELD SPLASH 9X12 PIC/PGM (MISCELLANEOUS) ×9 IMPLANT
SOL PREP POV-IOD 16OZ 10% (MISCELLANEOUS) ×3 IMPLANT
SOL PREP PROV IODINE SCRUB 4OZ (MISCELLANEOUS) IMPLANT
SPONGE GAUZE 4X4 12PLY (GAUZE/BANDAGES/DRESSINGS) ×3 IMPLANT
STAPLER VISISTAT 35W (STAPLE) ×3 IMPLANT
STOCKINETTE 8 INCH (MISCELLANEOUS) ×3 IMPLANT
SUT PROLENE 3 0 PS 2 (SUTURE) IMPLANT
SUT SILK 2 0 SH CR/8 (SUTURE) ×3 IMPLANT
SUT VIC AB 1 CT1 36 (SUTURE) ×6 IMPLANT
SUT VIC AB 2-0 CT1 27 (SUTURE) ×2
SUT VIC AB 2-0 CT1 TAPERPNT 27 (SUTURE) ×4 IMPLANT
TAPE CLOTH SURG 4X10 WHT LF (GAUZE/BANDAGES/DRESSINGS) ×3 IMPLANT
TOWEL OR 17X26 10 PK STRL BLUE (TOWEL DISPOSABLE) ×12 IMPLANT
WATER STERILE IRR 1500ML POUR (IV SOLUTION) IMPLANT

## 2010-12-17 NOTE — Progress Notes (Signed)
Subjective:  No chest pain, dyspnea, palpitations  Objective:  Vital Signs in the last 24 hours: Temp:  [97.8 F (36.6 C)-101.5 F (38.6 C)] 100.6 F (38.1 C) (11/08 0515) Pulse Rate:  [75-93] 80  (11/08 0515) Resp:  [15-26] 19  (11/08 0515) BP: (93-103)/(44-59) 96/51 mmHg (11/08 0515) SpO2:  [95 %-99 %] 96 % (11/08 0000) Weight:  [88.4 kg (194 lb 14.2 oz)] 194 lb 14.2 oz (88.4 kg) (11/07 0600)  Intake/Output from previous day: 11/07 0701 - 11/08 0700 In: 1745 [P.O.:360; I.V.:360; Blood:325; IV Piggyback:700] Out: 1076 [Urine:1075; Stool:1]  Physical Exam: Pt is alert and oriented, NAD HEENT: normal Neck: JVP - normal Lungs: CTA bilaterally CV: irregularly irregular with exp rhonchi Abd: soft, NT, Positive BS, no hepatomegaly Ext: feet are warm and well-perfused   Lab Results:  Basename 12/17/10 0103 12/16/10 0308  WBC 9.2 8.9  HGB 8.1* 7.9*  PLT 250 244    Basename 12/17/10 0103 12/16/10 0308  NA 126* 126*  K 3.3* 3.5  CL 94* 95*  CO2 24 24  GLUCOSE 117* 125*  BUN 8 9  CREATININE 0.47* 0.47*    Basename 12/15/10 0218 12/14/10 1900  TROPONINI <0.30 <0.30   Tele: Atrial fibrillation  Assessment/Plan:  Atrial fibrillation, rate-controlled. Pt remains on amiodarone and digoxin with good rate-control. Anticoagulants on hold in setting anemia and impending surgery. She is to undergo left AKA today. Cardiac status is stable. Will continue to follow postoperatively as she may need additional rate-controlling meds in post-op state. Fluid balance positive but she has required this to maintain adequate BP. Breathing comfortably and no evidence CHF at present. Echo reviewed and shows normal LV size and systolic function with a dilated right heart.   Tonny Bollman, M.D. 12/17/2010, 5:48 AM

## 2010-12-17 NOTE — Op Note (Signed)
[name], [name]          ACCOUNT NO.:  [id number]  MEDICAL RECORD NO.:  [id number]  LOCATION:  [facility]                         FACILITY:  [facility]  PHYSICIAN:  Madlyn Frankel. Charlann Boxer, M.D.  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  PROCEDURE:  Right above-the-knee amputation with primary wound closure with wound VAC placement on the medial aspect.  SURGEON:  Madlyn Frankel. Charlann Boxer, M.D.  ASSISTANT:  Lanney Gins, PA  Assistant, Lanney Gins, [facility], was present for the entirety of the case and important for facilitation of the case, preoperative positioning, retractor positioning, leg management in general, and primary wound closure at the end.  ANESTHESIA:  General.  SPECIMENS:  None.  COMPLICATION:  None.  DRAINS:  I did used a small wound VAC on the medial aspect of the wound and removed significant edematous thigh and to remove any potential purulence.  TOURNIQUET TIME:  80 minutes.  BLOOD LOSS:  Approximately 200.  INDICATIONS FOR PROCEDURE:  [name] is a 65 year old patient of mine, who has had significant complicating features with her right knee. She had a revision surgery and was complicated by infection, resection, and multiple surgeries to obtain a clean wound.  She subsequently underwent a revision and reimplantation of her right knee.  Within the past 2 months, this was complicated by a quadriceps dehiscence.  At the time of repair of the quad tendon, other than noting tension on the quad tendon, these tissues themselves appeared to be stable.  There was not any excessive tension on  the skin and it appeared to be in good as possible condition at the time of discharge with a healing wound, and no signs of any blistering, etc.  She presented to the emergency room on Monday 5th.  The significant change in the appearance of her wound with necrosis present in a smaller area about her knee and blood blisters present, significantly evolved and progressed into a major infection with significant necrosis to the anterior skin.  After reviewing the change and progression of her knee status as well as all that she had been through her history, extensive discussion was reviewed on the potential options.  After reviewing the risks and benefits, pros and cons of all potential options, the best clinical judgment at this point in treatment was to proceed with an above-the-knee amputation.  Risks, benefits, and necessities were discussed.  Consent was obtained with full understanding and relieved that this was the procedure of choice.  PROCEDURE IN DETAIL:  The patient was brought into the operative theater.  Once adequate anesthesia, she was already taking vancomycin and Zosyn, she was positioned in supine.  I did place a proximal thigh tourniquet to assistant in blood loss during the surgery.  The right lower extremity was then prepped and draped in a sterile fashion with a combination, and DuraPrep on the non-affected portions of leg and a Betadine prep on the other.  Time-out was performed identifying the patient, the planned procedure, and extremity.  The fishmouth type incision was made over the anterior aspect of the knee.  We did distal portion of the anterior incision, taken to the healthiest portion the tissue present.  Sharp dissection was carried down to the level anterior two-thirds of the femur.  With Cobb elevation and exposure of the femur, I then resected approximately 2 cm proximal to the skin cut.  This amounted to a cut that was in the relative  metadiaphyseal region.  As was anticipated based on her total knee replacement revision with cemented components, the saw cut encountered the cemented implant.  Once I had made this cut using an osteotome to circumferentially go around the prosthetic stem.  With this, I noted that it was easily can be removed.  I extended the posterior two-third incision to allow for fishmouth closure.  Sharp dissection was carried down through the muscle plane and compartment.  The sciatic nerve was identified and then incised sharply with a knife.  Vascular structures were identified and stick tied.  Once this was done, I irrigated the wound at various portions of the case with the pulse lavage, 6 L of normal saline solution included canal irrigation as well as general wound evacuation and debridement.  Once I was satisfied with this amputation and removal of the limb, I did remove the remaining cement in the femur using cemented Moreland cement removal system.  The canal was fully irrigated and debrided.  Once I was satisfied with debridement,  the level of amputation, I decided based on the amount of edema in her thigh and the potential purulence and some residual infection in the soft tissues to reapproximate the muscle compartments from posterior to anterior over the distal end of the distal femur, and then reapproximated the subcu layer about halfway across and then a little bit on the medial side, very medial.  I then kept this area open and packed a small wound VAC sponge in it.  Once this was done, the wound itself was dressed sterilely with a wound VAC and the wound VAC dressing.  We obtained a good suction and seal on the dressing.  I then wrapped the remaining stump in an Ace wrap.  She was then extubated and brought to the recovery in stable condition, tolerating the procedure well without medical complication.     Madlyn Frankel Charlann Boxer, M.D.     MDO/MEDQ  D:  12/17/2010  T:   12/17/2010  Job:  [id number]

## 2010-12-17 NOTE — Progress Notes (Signed)
    Subjective: Doing fine.  Ready to proceed later today.  Received 3 units PRBCs last night due to low preop Hgb and hypotension   Objective: Vital signs in last 24 hours: Temp:  [97.8 F (36.6 C)-101.5 F (38.6 C)] 99.9 F (37.7 C) (11/08 0630) Pulse Rate:  [75-93] 87  (11/08 0630) Resp:  [15-26] 24  (11/08 0630) BP: (93-116)/(44-59) 110/57 mmHg (11/08 0630) SpO2:  [95 %-99 %] 96 % (11/08 0000)   Basename 12/17/10 0103 12/16/10 0308 12/15/10 0218 12/14/10 1153  HGB 8.1* 7.9* 8.3* 9.3*    Basename 12/17/10 0103 12/16/10 0308  WBC 9.2 8.9  RBC 2.15* 2.12*  HCT 22.7* 22.5*  PLT 250 244    Basename 12/17/10 0103 12/16/10 0308  NA 126* 126*  K 3.3* 3.5  CL 94* 95*  CO2 24 24  BUN 8 9  CREATININE 0.47* 0.47*  GLUCOSE 117* 125*  CALCIUM 8.3* 8.1*   No results found for this basename: LABPT:2,INR:2 in the last 72 hours  I did not take down her dressing this am as I had examined last night and determined our plan based on that assessement  Assessment/Plan:  To OR today for RIGHT AKA. Consent on chart Receiving 3rd unit of blood pre-op Plan reviewed with Ms Raynor, and she is ready   [name] 12/17/2010, 7:05 AM

## 2010-12-17 NOTE — Progress Notes (Signed)
Asked to start 2nd iv site for pt; pt on iv vancomycin, and primaxin; for surgery today; pt has had picc lines in the past; suggest another picc line if pt will need long term abx; please advise; thank you!

## 2010-12-17 NOTE — Brief Op Note (Signed)
12/14/2010 - 12/17/2010  9:13 PM  PATIENT:  [name]  65 y.o. female  PRE-OPERATIVE DIAGNOSIS:  Chronic right total knee infection  POST-OPERATIVE DIAGNOSIS:  chronic right total knee infection  PROCEDURE:  Procedure(s): AMPUTATION ABOVE KNEE APPLICATION OF WOUND VAC  SURGEON:  Surgeon(s): [name]  PHYSICIAN ASSISTANT:   ASSISTANTS: Lanney Gins, PA-C   ANESTHESIA:   general  EBL:  Total I/O In: 1650 [I.V.:1650] Out: 400 [Urine:200; Blood:200]  BLOOD ADMINISTERED:none  DRAINS: Small Wound vac placed into theh medial aspect of the amputation wound bed  LOCAL MEDICATIONS USED:  NONE  SPECIMEN:  No Specimen  DISPOSITION OF SPECIMEN:  N/A  COUNTS:  YES  TOURNIQUET:   Total Tourniquet Time Documented: Thigh (Right) - 80 minutes  DICTATION: .Other Dictation: Dictation Number [id number]  PLAN OF CARE: Admit to inpatient   PATIENT DISPOSITION:  ICU - extubated and stable.   Delay start of Pharmacological VTE agent (>24hrs) due to surgical blood loss or risk of bleeding:  {YES/NO/NOT APPLICABLE:20182

## 2010-12-17 NOTE — Progress Notes (Signed)
Pt to OR for Right AKA per Dr. Nilsa Nutting progress note.

## 2010-12-17 NOTE — Anesthesia Preprocedure Evaluation (Addendum)
Anesthesia Evaluation  Patient identified by MRN, date of birth, ID band Patient awake    Reviewed: Allergy & Precautions, H&P , NPO status , Patient's Chart, lab work & pertinent test results  History of Anesthesia Complications Negative for: history of anesthetic complications  Airway Mallampati: II TM Distance: >3 FB Neck ROM: Full    Dental  (+) Edentulous Upper and Edentulous Lower   Pulmonary asthma , COPD COPD inhaler,  clear to auscultation        Cardiovascular hypertension, Pt. on medications + CAD and neg cardio ROS  Atrial fib   Neuro/Psych Rheumatoid arthritis CVA, No Residual Symptoms Negative Neurological ROS  Negative Psych ROS   GI/Hepatic negative GI ROS, Neg liver ROS,   Endo/Other  Negative Endocrine ROS  Renal/GU negative Renal ROS  Genitourinary negative   Musculoskeletal negative musculoskeletal ROS (+) Arthritis -, Rheumatoid disorders,    Abdominal Normal abdominal exam  (+)   Peds negative pediatric ROS (+)  Hematology negative hematology ROS (+) anemia   Anesthesia Other Findings Severe scoliosis  Reproductive/Obstetrics negative OB ROS                         Anesthesia Physical Anesthesia Plan  ASA: III  Anesthesia Plan: General   Post-op Pain Management:    Induction: Intravenous  Airway Management Planned: LMA  Additional Equipment:   Intra-op Plan:   Post-operative Plan:   Informed Consent: I have reviewed the patients History and Physical, chart, labs and discussed the procedure including the risks, benefits and alternatives for the proposed anesthesia with the patient or authorized representative who has indicated his/her understanding and acceptance.     Plan Discussed with: CRNA and Surgeon  Anesthesia Plan Comments:         Anesthesia Quick Evaluation

## 2010-12-17 NOTE — Transfer of Care (Signed)
Immediate Anesthesia Transfer of Care Note  Patient: [name]  Procedure(s) Performed:  AMPUTATION ABOVE KNEE; APPLICATION OF WOUND VAC  Patient Location: PACU  Anesthesia Type: General  Level of Consciousness: awake, alert  and oriented  Airway & Oxygen Therapy: Patient Spontanous Breathing and Patient connected to face mask oxygen  Post-op Assessment: Report given to PACU RN and Post -op Vital signs reviewed and stable  Post vital signs: Reviewed and stable  Complications: No apparent anesthesia complications

## 2010-12-17 NOTE — Progress Notes (Signed)
Subjective: Not in acute pain today. No pyrexia overnight.  Objective: Vital signs in last 24 hours: Temp:  [97.8 F (36.6 C)-100.4 F (38 C)] 100.2 F (37.9 C) (11/08 0740) Pulse Rate:  [75-95] 95  (11/08 0740) Resp:  [15-25] 19  (11/08 0740) BP: (93-119)/(44-59) 119/54 mmHg (11/08 0740) SpO2:  [96 %-99 %] 96 % (11/08 0000) Weight change:  Last BM Date: 12/16/10  Intake/Output from previous day: 11/07 0701 - 11/08 0700 In: 1759.5 [P.O.:360; I.V.:360; Blood:337.5; IV Piggyback:702] Out: 1476 [Urine:1475; Stool:1] Total I/O In: 350 [Blood:350] Out: -    Physical Exam: General: Comfortable, alert, communicative, fully oriented, not short of breath at rest.  HEENT: Moderate clinical pallor, no jaundice, no conjunctival injection or discharge.  NECK: Supple, JVP not seen, no carotid bruits, no palpable lymphadenopathy, no palpable goiter.  CHEST: Clinically clear to auscultation, no wheezes, no crackles.  HEART: Sounds 1 and 2 heard, normal, irregular, no murmurs.  ABDOMEN: Soft, non-tender, no palpable organomegaly, no palpable masses, normal bowel sounds.  LOWER EXTREMITIES: No new findings. MUSCULOSKELETAL SYSTEM: Generalized osteoarthritic changes, otherwise, normal.  CENTRAL NERVOUS SYSTEM: No focal neurologic deficit on gross examination.     Lab Results:  Basename 12/17/10 0103 12/16/10 0308  WBC 9.2 8.9  HGB 8.1* 7.9*  HCT 22.7* 22.5*  PLT 250 244    Basename 12/17/10 0103 12/16/10 0308  NA 126* 126*  K 3.3* 3.5  CL 94* 95*  CO2 24 24  GLUCOSE 117* 125*  BUN 8 9  CREATININE 0.47* 0.47*  CALCIUM 8.3* 8.1*   Recent Results (from the past 240 hour(s))  SURGICAL PCR SCREEN     Status: Normal   Collection Time   12/08/10 11:45 AM      Component Value Range Status Comment   MRSA, PCR NEGATIVE  NEGATIVE  Final    Staphylococcus aureus NEGATIVE  NEGATIVE  Final   CULTURE, BLOOD (ROUTINE X 2)     Status: Normal (Preliminary result)   Collection Time   12/14/10  3:15 AM      Component Value Range Status Comment   Specimen Description BLOOD LEFT ARM   Final    Special Requests BOTTLES DRAWN AEROBIC AND ANAEROBIC 5CC   Final    Setup Time [id number]   Final    Culture     Final    Value:        BLOOD CULTURE RECEIVED NO GROWTH TO DATE CULTURE WILL BE HELD FOR 5 DAYS BEFORE ISSUING A FINAL NEGATIVE REPORT   Report Status PENDING   Incomplete   URINE CULTURE     Status: Normal   Collection Time   12/14/10  3:32 AM      Component Value Range Status Comment   Specimen Description URINE, CATHETERIZED   Final    Special Requests NONE   Final    Setup Time [id number]   Final    Colony Count >=100,000 COLONIES/ML   Final    Culture ESCHERICHIA COLI   Final    Report Status 12/16/2010 FINAL   Final    Organism ID, Bacteria ESCHERICHIA COLI   Final   WOUND CULTURE     Status: Normal (Preliminary result)   Collection Time   12/16/10  4:08 PM      Component Value Range Status Comment   Specimen Description LEG   Final    Special Requests Normal   Final    Gram Stain     Final    Value:  MODERATE WBC PRESENT, PREDOMINANTLY PMN     NO SQUAMOUS EPITHELIAL CELLS SEEN     NO ORGANISMS SEEN   Culture PENDING   Incomplete    Report Status PENDING   Incomplete      Studies/Results: No results found.  Medications: Scheduled Meds:   . amiodarone  400 mg Oral BID  . digoxin  0.125 mg Oral Daily  . docusate sodium  100 mg Oral BID  . furosemide  20 mg Intravenous Once  . imipenem-cilastatin  500 mg Intravenous Q6H  . polyethylene glycol  17 g Oral Daily  . senna  1 tablet Oral BID AC  . vancomycin  1,000 mg Intravenous Q12H  . DISCONTD: heparin subcutaneous  5,000 Units Subcutaneous Q8H   Continuous Infusions:   . sodium chloride 20 mL/hr at 12/16/10 1000   PRN Meds:.acetaminophen, acetaminophen, alum & mag hydroxide-simeth, bisacodyl, bisacodyl, HYDROcodone-acetaminophen, HYDROmorphone, menthol-cetylpyridinium, metoCLOPramide (REGLAN)  injection, metoCLOPramide, ondansetron (ZOFRAN) IV, ondansetron, phenol, senna-docusate, sodium phosphate, zolpidem  Assessment/Plan:  Principal Problem:  *Atrial fibrillation with RVR: Still rate-controlled with a combination of Amiodarone/Digoxin, as recommended by Dr Excell Seltzer, cardiologist. Not on Xarelto at this time. Apparently, was not on Pradaxa, either.  Active Problems:  1. Hypotension, unspecified: Resolved. 2. Cellulitis right leg/Superficial skin necrosis and septic knee. Dr Candie Chroman (vascular surgeon) input on 12/16/10, much appreciated. No vascular compromise. .  3. Septic joint: Reviewed by Dr Charlann Boxer on 12/16/10, and is scheduled for AKA today.  4. E.coli UTI. On antibiotics.  Note: Background problems remain stable. Continue current antibiotic regimen. Now Vanc/Primaxin day # 4 Follow hb closely. May need blood transfusion post-op, due to ABLA on chronic anemia.   LOS: 3 days   Darriel Sinquefield,[name] 12/17/2010, 8:16 AM

## 2010-12-17 NOTE — Anesthesia Postprocedure Evaluation (Signed)
  Anesthesia Post-op Note  Patient: [name]  Procedure(s) Performed:  AMPUTATION ABOVE KNEE; APPLICATION OF WOUND VAC - wound Vac applied @ 2045 By Luna Fuse  Patient Location: PACU  Anesthesia Type: General  Level of Consciousness: awake and alert   Airway and Oxygen Therapy: Patient Spontanous Breathing  Post-op Pain: mild  Post-op Assessment: Post-op Vital signs reviewed, Patient's Cardiovascular Status Stable, Respiratory Function Stable, Patent Airway and No signs of Nausea or vomiting  Post-op Vital Signs: stable  Complications: No apparent anesthesia complications

## 2010-12-18 DIAGNOSIS — L0291 Cutaneous abscess, unspecified: Secondary | ICD-10-CM

## 2010-12-18 DIAGNOSIS — S78119A Complete traumatic amputation at level between unspecified hip and knee, initial encounter: Secondary | ICD-10-CM

## 2010-12-18 DIAGNOSIS — L0390 Cellulitis, unspecified: Secondary | ICD-10-CM

## 2010-12-18 LAB — TYPE AND SCREEN
ABO/RH(D): A POS
Antibody Screen: NEGATIVE
Unit division: 0
Unit division: 0
Unit division: 0

## 2010-12-18 LAB — COMPREHENSIVE METABOLIC PANEL
AST: 82 U/L — ABNORMAL HIGH (ref 0–37)
BUN: 8 mg/dL (ref 6–23)
CO2: 26 mEq/L (ref 19–32)
Chloride: 95 mEq/L — ABNORMAL LOW (ref 96–112)
Creatinine, Ser: 0.43 mg/dL — ABNORMAL LOW (ref 0.50–1.10)
GFR calc non Af Amer: >90 mL/min (ref >90)
Glucose, Bld: 105 mg/dL — ABNORMAL HIGH (ref 70–99)
Total Bilirubin: 0.5 mg/dL (ref 0.3–1.2)

## 2010-12-18 LAB — WOUND CULTURE: Special Requests: NORMAL

## 2010-12-18 LAB — CBC
Hemoglobin: 10.1 g/dL — ABNORMAL LOW (ref 12.0–15.0)
RBC: 2.87 MIL/uL — ABNORMAL LOW (ref 3.87–5.11)

## 2010-12-18 MED ORDER — SENNOSIDES-DOCUSATE SODIUM 8.6-50 MG PO TABS
1.0000 | ORAL_TABLET | Freq: Every evening | ORAL | Status: DC | PRN
  Filled 2010-12-18: qty 1

## 2010-12-18 MED ORDER — AMIODARONE HCL 200 MG PO TABS
200.0000 mg | ORAL_TABLET | Freq: Every day | ORAL | Status: DC
  Administered 2010-12-18 – 2010-12-28 (×11): 200 mg via ORAL
  Filled 2010-12-18 (×11): qty 1

## 2010-12-18 MED ORDER — HYDROCODONE-ACETAMINOPHEN 7.5-325 MG PO TABS
1.0000 | ORAL_TABLET | ORAL | Status: DC | PRN
  Administered 2010-12-18 – 2010-12-19 (×6): 2 via ORAL
  Administered 2010-12-19 (×2): 1 via ORAL
  Administered 2010-12-19 – 2010-12-28 (×24): 2 via ORAL
  Filled 2010-12-18 (×31): qty 2

## 2010-12-18 MED ORDER — FERROUS SULFATE 325 (65 FE) MG PO TABS
325.0000 mg | ORAL_TABLET | Freq: Three times a day (TID) | ORAL | Status: DC
  Administered 2010-12-18 – 2010-12-28 (×28): 325 mg via ORAL
  Filled 2010-12-18 (×33): qty 1

## 2010-12-18 MED ORDER — ONDANSETRON HCL 4 MG/2ML IJ SOLN
4.0000 mg | Freq: Four times a day (QID) | INTRAMUSCULAR | Status: DC | PRN
  Administered 2010-12-20 – 2010-12-25 (×3): 4 mg via INTRAVENOUS
  Filled 2010-12-18 (×3): qty 2

## 2010-12-18 MED ORDER — CYCLOBENZAPRINE HCL 5 MG PO TABS
5.0000 mg | ORAL_TABLET | Freq: Three times a day (TID) | ORAL | Status: DC | PRN
  Administered 2010-12-18 (×2): 5 mg via ORAL
  Filled 2010-12-18 (×3): qty 1

## 2010-12-18 MED ORDER — METHOCARBAMOL 500 MG PO TABS: 500.0000 mg | ORAL_TABLET | Freq: Four times a day (QID) | ORAL | Status: DC | PRN

## 2010-12-18 MED ORDER — METHOCARBAMOL 100 MG/ML IJ SOLN: 500.0000 mg | Freq: Four times a day (QID) | INTRAMUSCULAR | Status: DC | PRN

## 2010-12-18 MED ORDER — HYDROMORPHONE HCL PF 1 MG/ML IJ SOLN
0.5000 mg | INTRAMUSCULAR | Status: DC | PRN
  Administered 2010-12-18 (×3): 0.5 mg via INTRAVENOUS
  Administered 2010-12-18: 1 mg via INTRAVENOUS
  Administered 2010-12-18 (×2): 0.5 mg via INTRAVENOUS
  Administered 2010-12-19: 1 mg via INTRAVENOUS
  Administered 2010-12-19 (×3): 0.5 mg via INTRAVENOUS
  Administered 2010-12-20 – 2010-12-27 (×35): 1 mg via INTRAVENOUS
  Filled 2010-12-18 (×47): qty 1

## 2010-12-18 MED ORDER — METOCLOPRAMIDE HCL 10 MG PO TABS
5.0000 mg | ORAL_TABLET | Freq: Three times a day (TID) | ORAL | Status: DC | PRN
  Administered 2010-12-23: 10 mg via ORAL
  Filled 2010-12-18: qty 1

## 2010-12-18 MED ORDER — FLEET ENEMA 7-19 GM/118ML RE ENEM: 1.0000 | ENEMA | Freq: Every day | RECTAL | Status: DC | PRN

## 2010-12-18 MED ORDER — DOCUSATE SODIUM 100 MG PO CAPS
100.0000 mg | ORAL_CAPSULE | Freq: Two times a day (BID) | ORAL | Status: DC
  Administered 2010-12-18 – 2010-12-28 (×11): 100 mg via ORAL
  Filled 2010-12-18 (×22): qty 1

## 2010-12-18 MED ORDER — POLYETHYLENE GLYCOL 3350 17 G PO PACK
17.0000 g | PACK | Freq: Every day | ORAL | Status: DC | PRN
  Administered 2010-12-22: 17 g via ORAL
  Filled 2010-12-18: qty 1

## 2010-12-18 MED ORDER — METOCLOPRAMIDE HCL 5 MG/ML IJ SOLN
5.0000 mg | Freq: Three times a day (TID) | INTRAMUSCULAR | Status: DC | PRN
  Administered 2010-12-25: 10 mg via INTRAVENOUS
  Filled 2010-12-18: qty 2

## 2010-12-18 MED ORDER — ONDANSETRON HCL 4 MG PO TABS: 4.0000 mg | ORAL_TABLET | Freq: Four times a day (QID) | ORAL | Status: DC | PRN

## 2010-12-18 MED ORDER — ZOLPIDEM TARTRATE 5 MG PO TABS: 5.0000 mg | ORAL_TABLET | Freq: Every evening | ORAL | Status: DC | PRN

## 2010-12-18 NOTE — Progress Notes (Signed)
Physical Therapy Evaluation Patient Details Name: [name] MRN: [id number] DOB: Jun 30, 1945 Today's Date: 12/18/2010 1015-1108 Ev2, 1Ta  Problem List:  Patient Active Problem List  Diagnoses  . Atrial fibrillation with RVR  . Hypotension, unspecified  . Cellulitis and abscess of leg    Past Medical History:  Past Medical History  Diagnosis Date  . Emphysema   . Coronary artery disease   . Atrial fibrillation   . COPD (chronic obstructive pulmonary disease)   . History of atrial fibrillation     S/pP Ablation  . Asthma   . Chronic kidney disease     HX OF UTI  . Arthritis   . Osteoarthritis   . Rheumatoid arthritis   . Anemia   . B12 deficiency anemia   . Stroke     slight stroke in 2009-no deficits  . Neuromuscular disorder     hx of right quadricep rupture  . DJD (degenerative joint disease)   . History of benign bladder tumor    Past Surgical History:  Past Surgical History  Procedure Date  . Replacement total knee   . Cardiac electrophysiology mapping and ablation   . Cholecystectomy 1996  . Cataract extraction, bilateral   . Hematoma evacuation 2009    From right groin  . Joint replacement     Left-1996, Right-1995  . Multiple tooth extractions 06/30/2010  . Trigger finger release 2011    PT Assessment/Plan/Recommendation PT Assessment Clinical Impression Statement: Patient s/p right AKA with extensive h/o right LE surgical interventions and independent functioning at home from wheelchair level presents with decreased activity tolerance with acute pain, decreased strength, decreased balance and mobility and will benefit from skilled PT in the acute care setting to achieve max independence to allow return home at mod independent level after rehab stay. PT Recommendation/Assessment: Patient will need skilled PT in the acute care venue PT Problem List: Decreased balance;Decreased mobility;Decreased activity tolerance;Decreased strength;Decreased  knowledge of precautions;Decreased safety awareness Barriers to Discharge: Decreased caregiver support PT Therapy Diagnosis : Generalized weakness;Acute pain PT Plan PT Frequency: Min 4X/week PT Treatment/Interventions: Patient/family education PT Recommendation Recommendations for Other Services: Rehab consult Follow Up Recommendations: Inpatient Rehab Equipment Recommended: Defer to next venue PT Goals  Acute Rehab PT Goals PT Goal Formulation: With patient Time For Goal Achievement: 2 weeks Pt will go Supine/Side to Sit: with min assist PT Goal: Supine/Side to Sit - Progress: Progressing toward goal Pt will go Sit to Supine/Side: with min assist PT Goal: Sit to Supine/Side - Progress: Progressing toward goal Pt will Transfer Bed to Chair/Chair to Bed: with min assist PT Transfer Goal: Bed to Chair/Chair to Bed - Progress: Progressing toward goal Pt will Stand: with min assist;1 - 2 min;with bilateral upper extremity support PT Goal: Stand - Progress: Progressing toward goal Pt will Propel Wheelchair: > 150 feet;with modified independence PT Goal: Propel Wheelchair - Progress: Progressing toward goal  PT Evaluation Precautions/Restrictions  Precautions Precautions: Fall Restrictions Weight Bearing Restrictions: Yes RLE Weight Bearing: Non weight bearing Prior Functioning  Home Living Lives With: Alone Receives Help From: Other (Comment) (has not needed A; mod I level with wheelchair; friends can a) Type of Home: Apartment Home Layout: One level Home Access: Engineer, maintenance (IT) Shower/Tub: Health visitor: Handicapped height Bathroom Accessibility: Yes How Accessible: Accessible via wheelchair Home Adaptive Equipment: Bedside commode/3-in-1;Hospital bed;Wheelchair - manual;Walker - rolling;Grab bars in shower Prior Function Level of Independence: Requires assistive device for independence;Independent with transfers;Independent with basic ADLs Able to  Take  Stairs?: No Driving: No Vocation: Retired Producer, television/film/video: Awake/alert Overall Cognitive Status: Appears within functional limits for tasks assessed Cognition - Other Comments: Patient complains of periods of disorientation due to meds Sensation/Coordination Sensation Light Touch: Impaired Detail Light Touch Impaired Details: 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) Additional Comments: pt c/o phantom leg pain Extremity Assessment RLE Assessment RLE Assessment: Exceptions to [facility] RLE Strength Right Hip Flexion: 2/5 (Patient able to lift with assist) LLE Assessment LLE Assessment: Within Functional Limits Mobility (including Balance) Bed Mobility Bed Mobility: Yes (sitting scooted to [facility] with mod assist) Supine to Sit: 3: Mod assist Supine to Sit Details (indicate cue type and reason):   Sitting - Scoot to Edge of Bed:  (allowed patient incr time to scoot with superv, then assist) Sit to Supine - Left: 1: +2 Total assist;Patient percentage (comment) (pt=20%) Transfers Transfers: Yes Squat Pivot Transfers: 2: Max Designer, industrial/product Details (indicate cue type and reason): attempted x 2 bed to chair with max to +2 assist, patient unable due to too much pain right LE  Balance Balance Assessed: Yes Static Sitting Balance Static Sitting - Balance Support: Feet supported;No upper extremity supported Static Sitting - Level of Assistance: 5: Stand by assistance Static Sitting - Comment/# of Minutes: brief periods of no UE support, total time sitting approximately 15 minutes including scooting Exercise    End of Session PT - End of Session Activity Tolerance: Patient limited by pain Patient left: in bed;with call bell in reach;with family/visitor present (sister) General Behavior During Session: [facility] for tasks performed Cognition: [facility] for tasks performed  [facility] 12/18/2010, 12:54 PM

## 2010-12-18 NOTE — Progress Notes (Signed)
ANTIBIOTIC CONSULT NOTE - FOLLOW UP  Pharmacy Consult for Vancomycin and Primaxin Indication: Chronic TKA infection  Allergies  Allergen Reactions  . Sulfa Antibiotics Shortness Of Breath  . Avelox (Moxifloxacin Hcl In Nacl)   . Penicillins   . Robaxin Nausea And Vomiting    Patient Measurements: Height: 5\' 11"  (180.3 cm) Weight: 194 lb 14.2 oz (88.4 kg) IBW/kg (Calculated) : 70.8    Vital Signs: Temp: 99 F (37.2 C) (11/09 0800) Temp src: Core (Comment) (11/09 0800) BP: 95/69 mmHg (11/09 0800) Pulse Rate: 88  (11/09 0917) Intake/Output from previous day: 11/08 0701 - 11/09 0700 In: 3435 [I.V.:2335; Blood:350; IV Piggyback:750] Out: 2050 [Urine:1700; Drains:150; Blood:200] Intake/Output from this shift: Total I/O In: 440 [P.O.:240; I.V.:200] Out: 30 [Urine:30]  Labs:  [facility] 12/18/10 0308 12/17/10 0103 12/16/10 0308  WBC 11.5* 9.2 8.9  HGB 10.1* 8.1* 7.9*  PLT 254 250 244  LABCREA -- -- --  CREATININE 0.43* 0.47* 0.47*   Estimated Creatinine Clearance: 86.1 ml/min (by C-G formula based on Cr of 0.43).  Basename 12/16/10 2210  VANCOTROUGH 17.8  VANCOPEAK --  VANCORANDOM --  GENTTROUGH --  GENTPEAK --  GENTRANDOM --  TOBRATROUGH --  TOBRAPEAK --  TOBRARND --  AMIKACINPEAK --  AMIKACINTROU --  AMIKACIN --     Microbiology: Recent Results (from the past 720 hour(s))  SURGICAL PCR SCREEN     Status: Normal   Collection Time   12/08/10 11:45 AM      Component Value Range Status Comment   MRSA, PCR NEGATIVE  NEGATIVE  Final    Staphylococcus aureus NEGATIVE  NEGATIVE  Final   CULTURE, BLOOD (ROUTINE X 2)     Status: Normal (Preliminary result)   Collection Time   12/14/10  3:15 AM      Component Value Range Status Comment   Specimen Description BLOOD LEFT ARM   Final    Special Requests BOTTLES DRAWN AEROBIC AND ANAEROBIC 5CC   Final    Setup Time [id number]   Final    Culture     Final    Value:        BLOOD CULTURE RECEIVED NO GROWTH TO DATE  CULTURE WILL BE HELD FOR 5 DAYS BEFORE ISSUING A FINAL NEGATIVE REPORT   Report Status PENDING   Incomplete   URINE CULTURE     Status: Normal   Collection Time   12/14/10  3:32 AM      Component Value Range Status Comment   Specimen Description URINE, CATHETERIZED   Final    Special Requests NONE   Final    Setup Time [id number]   Final    Colony Count >=100,000 COLONIES/ML   Final    Culture ESCHERICHIA COLI   Final    Report Status 12/16/2010 FINAL   Final    Organism ID, Bacteria ESCHERICHIA COLI   Final   WOUND CULTURE     Status: Normal (Preliminary result)   Collection Time   12/16/10  4:08 PM      Component Value Range Status Comment   Specimen Description LEG   Final    Special Requests Normal   Final    Gram Stain     Final    Value: MODERATE WBC PRESENT, PREDOMINANTLY PMN     NO SQUAMOUS EPITHELIAL CELLS SEEN     NO ORGANISMS SEEN   Culture NO GROWTH   Final    Report Status PENDING   Incomplete     Anti-infectives  Start     Dose/Rate Route Frequency Ordered Stop   12/14/10 1200   imipenem-cilastatin (PRIMAXIN) 500 mg in sodium chloride 0.9 % 100 mL IVPB        500 mg 200 mL/hr over 30 Minutes Intravenous 4 times per day 12/14/10 1043     12/14/10 1100   vancomycin (VANCOCIN) IVPB 1000 mg/200 mL premix        1,000 mg 200 mL/hr over 60 Minutes Intravenous Every 12 hours 12/14/10 1043            Assessment:  D#3/x Primaxin 500mg  q6h/Vanc 1g q12h for knee infection/gangrenous adjacent skin, now s/p right AKA.  11/7 VT 17.8 (VT goal 15-20), Estimated Creatinine Clearance: 86.1 ml/min (by C-G formula based on Cr of 0.43).  Wbc 11.5, Tm 99.1, Ucx ecoli (resistant to amp only), Blood and wound cxs: ngtd  Source of infection has now been eliminated.  Since blood cultures have not grown any organisms, would be acceptable to discontinue antibiotics in the next 24-48 hours.  Goal of Therapy:  Vancomycin trough level 15-20 mcg/ml  Plan:  Continue current  doses of antibiotics. Follow up plan for duration of antibiotics s/p AKA.  Clance Boll 12/18/2010,9:34 AM

## 2010-12-18 NOTE — Consult Note (Signed)
Consult requested to assist with vac routines. Vac applied last night in OR by ortho team.  Dressing intact to right knee with good seal at 125cm. con't suction, mod pink drainage.  Plan for dressing change Monday.  Ladona Mow, CWOCN can assess for follow-up needs. Mardee Postin, RN, MSN, [employer]  [phone]

## 2010-12-18 NOTE — Progress Notes (Signed)
Subjective:  Lots of pain at AKA site overnight. No chest pain, dyspnea, palps.  Objective:  Vital Signs in the last 24 hours: Temp:  [97.2 F (36.2 C)-99.1 F (37.3 C)] 99 F (37.2 C) (11/09 0800) Pulse Rate:  [75-96] 93  (11/09 0800) Resp:  [13-21] 17  (11/09 0800) BP: (88-131)/(40-69) 95/69 mmHg (11/09 0800) SpO2:  [89 %-100 %] 97 % (11/09 0800)  Intake/Output from previous day: 11/08 0701 - 11/09 0700 In: 3235 [I.V.:2135; Blood:350; IV Piggyback:750] Out: 1650 [Urine:1450; Blood:200]  Physical Exam: Pt is alert and oriented, NAD HEENT: normal Neck: JVP - normal, carotids 2+= without bruits Lungs: decreased air movement bilaterally CV: irregularly irregular without murmur or gallop Abd: soft, NT, Positive BS, no hepatomegaly Ext: right AKA, no left leg edema Skin: warm/dry no rash   Lab Results:  Basename 12/18/10 0308 12/17/10 0103  WBC 11.5* 9.2  HGB 10.1* 8.1*  PLT 254 250    Basename 12/18/10 0308 12/17/10 0103  NA 128* 126*  K 3.5 3.3*  CL 95* 94*  CO2 26 24  GLUCOSE 105* 117*  BUN 8 8  CREATININE 0.43* 0.47*   No results found for this basename: TROPONINI:2,CK,MB:2 in the last 72 hours  Tele: Atrial fib HR approx 80 bpm  Assessment/Plan:  PAF - has been in AF throughout hospital stay, well rate-controlled. Would reduce Amiodarone back to 200 mg daily. Continue digoxin and check dig level in AM. Would avoid beta-blocker/CCB for now as BP remains low. Recommend resume pradaxa and discontinue lovenox when pradaxa is started. Would do this when ok to anticoagulate from a post-operative perspective.   **The patient has been followed by Dr Chales Abrahams in [facility] for many years. She has been told that she will likely need PPM if recurrent AFib because she does not tolerate AF well. She will need close follow-up with him after she returns home from rehab facility**   Tonny Bollman, M.D. 12/18/2010, 8:31 AM

## 2010-12-18 NOTE — Progress Notes (Signed)
UR review completed. 

## 2010-12-18 NOTE — Progress Notes (Signed)
Occupational Therapy Evaluation Patient Details Name: [name] MRN: [id number] DOB: 27-Feb-1945 Today's Date: 12/18/2010  Problem List:  Patient Active Problem List  Diagnoses  . Atrial fibrillation with RVR  . Hypotension, unspecified  . Cellulitis and abscess of leg    Past Medical History:  Past Medical History  Diagnosis Date  . Emphysema   . Coronary artery disease   . Atrial fibrillation   . COPD (chronic obstructive pulmonary disease)   . History of atrial fibrillation     S/pP Ablation  . Asthma   . Chronic kidney disease     HX OF UTI  . Arthritis   . Osteoarthritis   . Rheumatoid arthritis   . Anemia   . B12 deficiency anemia   . Stroke     slight stroke in 2009-no deficits  . Neuromuscular disorder     hx of right quadricep rupture  . DJD (degenerative joint disease)   . History of benign bladder tumor    Past Surgical History:  Past Surgical History  Procedure Date  . Replacement total knee   . Cardiac electrophysiology mapping and ablation   . Cholecystectomy 1996  . Cataract extraction, bilateral   . Hematoma evacuation 2009    From right groin  . Joint replacement     Left-1996, Right-1995  . Multiple tooth extractions 06/30/2010  . Trigger finger release 2011    OT Assessment/Plan/Recommendation OT Assessment Clinical Impression Statement: pt is very motivated and would benefit from OT secondaryto deficits below with goals of overall min to mod assist in acute OT Recommendation/Assessment: Patient will need skilled OT in the acute care venue OT Problem List: Decreased strength;Decreased activity tolerance;Decreased knowledge of use of DME or AE;Cardiopulmonary status limiting activity;Impaired sensation;Pain Barriers to Discharge: Decreased caregiver support OT Therapy Diagnosis : Generalized weakness OT Plan OT Frequency: Min 2X/week OT Treatment/Interventions: Self-care/ADL training;Therapeutic activities;DME and/or AE  instruction;Patient/family education;Balance training OT Recommendation Follow Up Recommendations: Inpatient Rehab Equipment Recommended: Defer to next venue Individuals Consulted Consulted and Agree with Results and Recommendations: Patient OT Goals Acute Rehab OT Goals OT Goal Formulation: With patient ADL Goals Pt Will Perform Lower Body Bathing: with supervision;Supine, rolling right and/or left;with adaptive equipment ADL Goal: Lower Body Bathing - Progress: Progressing toward goals Pt Will Perform Lower Body Dressing: with supervision;Supine, rolling right and/or left;with adaptive equipment ADL Goal: Lower Body Dressing - Progress: Progressing toward goals Pt Will Transfer to Toilet: with mod assist;3-in-1;Other (comment);Stand pivot transfer (modified stand pivot transfer; 3:1 next to bed) ADL Goal: Toilet Transfer - Progress: Progressing toward goals Pt Will Perform Toileting - Hygiene: with set-up;Leaning right and/or left on 3-in-1/toilet ADL Goal: Toileting - Hygiene - Progress: Progressing toward goals Additional ADL Goal #1: pt will stand x 2 minutes with RW and min guard for ADLS ADL Goal: Additional Goal #1 - Progress: Progressing toward goals  OT Evaluation Precautions/Restrictions  Precautions Precautions: Fall Restrictions Weight Bearing Restrictions: Yes RLE Weight Bearing: Non weight bearing Prior Functioning Home Living Lives With: Alone Receives Help From: Other (Comment) (has not needed A; mod I level with wheelchair; friends can a) Type of Home: Apartment Home Layout: One level Home Access: Elevator Bathroom Shower/Tub: Health visitor: Handicapped height Bathroom Accessibility: Yes How Accessible: Accessible via wheelchair Home Adaptive Equipment: Bedside commode/3-in-1;Hospital bed;Wheelchair - manual;Walker - rolling;Grab bars in shower Prior Function Level of Independence: Requires assistive device for independence;Independent with  transfers;Independent with basic ADLs Able to Take Stairs?: No Driving: No Vocation: Retired  ADL ADL Eating/Feeding: Simulated;Independent Where Assessed - Eating/Feeding: Bed level Grooming: Simulated;Set up Where Assessed - Grooming: Supine, head of bed up Upper Body Bathing: Simulated;Set up Where Assessed - Upper Body Bathing: Supine, head of bed up Lower Body Bathing: Simulated;Moderate assistance Where Assessed - Lower Body Bathing: Rolling right and/or left Upper Body Dressing: Simulated;Minimal assistance;Other (comment) (secondary to lines) Where Assessed - Upper Body Dressing: Supine, head of bed up Lower Body Dressing: Simulated;Moderate assistance Where Assessed - Lower Body Dressing: Rolling right and/or left;Other (comment) (only rolled to left during eval) Toilet Transfer: Other (comment) (unable today) ADL Comments: co eval with PT.  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) Additional Comments: pt c/o phantom leg pain Extremity Assessment RUE Assessment RUE Assessment: Within Functional Limits LUE Assessment LUE Assessment: Within Functional Limits Mobility  Bed Mobility Bed Mobility: Yes (sitting scooted to [facility] with mod assist) Supine to Sit: 3: Mod assist Supine to Sit Details (indicate cue type and reason):  (pt managed r leg mostly; assist with pad to scoot forwards) Sitting - Scoot to Edge of Bed:  (allowed patient incr time to scoot  with superv, then assist) Sit to Supine - Left: 1: +2 Total assist;Patient percentage (comment) (pt=20%) Exercises   End of Session OT - End of Session Equipment Utilized During Treatment: Gait belt Activity Tolerance: Patient limited by pain Patient left: in bed General Behavior During Session: [facility] for tasks performed Cognition: [facility] for tasks performed   [facility] 12/18/2010, 12:54 PM

## 2010-12-18 NOTE — Progress Notes (Signed)
  POD # 1 from Right AKA  Subjective: Doing fairly well.  Dealing with initial post-op pain fairly well.  Blood pressure a little low, so dilaudid has been used minimally   Objective: Vital signs in last 24 hours: Temp:  [97.2 F (36.2 C)-100.2 F (37.9 C)] 99.1 F (37.3 C) (11/09 0600) Pulse Rate:  [75-96] 85  (11/09 0600) Resp:  [13-26] 19  (11/09 0600) BP: (88-131)/(40-69) 88/44 mmHg (11/09 0600) SpO2:  [89 %-100 %] 97 % (11/09 0600)   Basename 12/18/10 0308 12/17/10 0103 12/16/10 0308  HGB 10.1* 8.1* 7.9*    Basename 12/18/10 0308 12/17/10 0103  WBC 11.5* 9.2  RBC 2.87* 2.15*  HCT 28.2* 22.7*  PLT 254 250    Basename 12/18/10 0308 12/17/10 0103  NA 128* 126*  K 3.5 3.3*  CL 95* 94*  CO2 26 24  BUN 8 8  CREATININE 0.43* 0.47*  GLUCOSE 105* 117*  CALCIUM 8.2* 8.3*   No results found for this basename: LABPT:2,INR:2 in the last 72 hours  Right amputation stump dressing dry with wound vac medially.  No complicating findings  Assessment/Plan:  Day of recovery and stabilization. Wound care consult for management of wound VAC  Once on floor will consult CIR for amputation management and rehab.   [name] D 12/18/2010, 7:06 AM

## 2010-12-18 NOTE — Progress Notes (Signed)
Subjective: Feels much better today. Not in acute pain, no pyrexia, no chest pain or SOB.  Objective: Vital signs in last 24 hours: Temp:  [97.2 F (36.2 C)-99.1 F (37.3 C)] 99 F (37.2 C) (11/09 0800) Pulse Rate:  [75-96] 88  (11/09 0917) Resp:  [13-21] 17  (11/09 0800) BP: (88-131)/(40-69) 95/69 mmHg (11/09 0800) SpO2:  [89 %-100 %] 97 % (11/09 0800) Weight change:  Last BM Date: 12/16/10  Intake/Output from previous day: 11/08 0701 - 11/09 0700 In: 3435 [I.V.:2335; Blood:350; IV Piggyback:750] Out: 2050 [Urine:1700; Drains:150; Blood:200] Total I/O In: 590 [P.O.:240; I.V.:250; IV Piggyback:100] Out: 180 [Urine:180]   Physical Exam: General: Comfortable, alert, communicative, fully oriented, not short of breath at rest.  HEENT: Mild clinical pallor, no jaundice, no conjunctival injection or discharge.  NECK: Supple, JVP not seen, no carotid bruits, no palpable lymphadenopathy, no palpable goiter.  CHEST: Clinically clear to auscultation, no wheezes, no crackles.  HEART: Sounds 1 and 2 heard, normal, irregular, no murmurs.  ABDOMEN: Soft, non-tender, no palpable organomegaly, no palpable masses, normal bowel sounds.  LOWER EXTREMITIES: RLE stump under dressings..  MUSCULOSKELETAL SYSTEM: Generalized osteoarthritic changes, otherwise, normal.  CENTRAL NERVOUS SYSTEM: No focal neurologic deficit on gross examination.       Lab Results:  [facility] 12/18/10 0308 12/17/10 0103  WBC 11.5* 9.2  HGB 10.1* 8.1*  HCT 28.2* 22.7*  PLT 254 250    Basename 12/18/10 0308 12/17/10 0103  NA 128* 126*  K 3.5 3.3*  CL 95* 94*  CO2 26 24  GLUCOSE 105* 117*  BUN 8 8  CREATININE 0.43* 0.47*  CALCIUM 8.2* 8.3*   Recent Results (from the past 240 hour(s))  SURGICAL PCR SCREEN     Status: Normal   Collection Time   12/08/10 11:45 AM      Component Value Range Status Comment   MRSA, PCR NEGATIVE  NEGATIVE  Final    Staphylococcus aureus NEGATIVE  NEGATIVE  Final   CULTURE,  BLOOD (ROUTINE X 2)     Status: Normal (Preliminary result)   Collection Time   12/14/10  3:15 AM      Component Value Range Status Comment   Specimen Description BLOOD LEFT ARM   Final    Special Requests BOTTLES DRAWN AEROBIC AND ANAEROBIC 5CC   Final    Setup Time [id number]   Final    Culture     Final    Value:        BLOOD CULTURE RECEIVED NO GROWTH TO DATE CULTURE WILL BE HELD FOR 5 DAYS BEFORE ISSUING A FINAL NEGATIVE REPORT   Report Status PENDING   Incomplete   URINE CULTURE     Status: Normal   Collection Time   12/14/10  3:32 AM      Component Value Range Status Comment   Specimen Description URINE, CATHETERIZED   Final    Special Requests NONE   Final    Setup Time [id number]   Final    Colony Count >=100,000 COLONIES/ML   Final    Culture ESCHERICHIA COLI   Final    Report Status 12/16/2010 FINAL   Final    Organism ID, Bacteria ESCHERICHIA COLI   Final   WOUND CULTURE     Status: Normal (Preliminary result)   Collection Time   12/16/10  4:08 PM      Component Value Range Status Comment   Specimen Description LEG   Final    Special Requests Normal  Final    Gram Stain     Final    Value: MODERATE WBC PRESENT, PREDOMINANTLY PMN     NO SQUAMOUS EPITHELIAL CELLS SEEN     NO ORGANISMS SEEN   Culture NO GROWTH   Final    Report Status PENDING   Incomplete      Studies/Results: No results found.  Medications: Scheduled Meds:   . amiodarone  200 mg Oral Daily  . digoxin  0.125 mg Oral Daily  . docusate sodium  100 mg Oral BID  . enoxaparin  40 mg Subcutaneous Daily  . ferrous sulfate  325 mg Oral TID PC  . imipenem-cilastatin  500 mg Intravenous Q6H  . polyethylene glycol  17 g Oral Daily  . senna  1 tablet Oral BID AC  . vancomycin  1,000 mg Intravenous Q12H  . vitamin A & D      . DISCONTD: amiodarone  400 mg Oral BID  . DISCONTD: docusate sodium  100 mg Oral BID   Continuous Infusions:   . 0.9 % NaCl with KCl 20 mEq / L 100 mL/hr (12/18/10 0000)  .  DISCONTD: sodium chloride 20 mL/hr at 12/16/10 1000  . DISCONTD: lactated ringers 1,000 mL (12/17/10 1753)   PRN Meds:.acetaminophen, acetaminophen, alum & mag hydroxide-simeth, bisacodyl, bisacodyl, cyclobenzaprine, HYDROcodone-acetaminophen, HYDROmorphone, menthol-cetylpyridinium, metoCLOPramide (REGLAN) injection, metoCLOPramide, ondansetron (ZOFRAN) IV, ondansetron, phenol, polyethylene glycol, senna-docusate, sodium phosphate, sodium phosphate, zolpidem, DISCONTD: HYDROcodone-acetaminophen, DISCONTD: HYDROmorphone, DISCONTD: HYDROmorphone DISCONTD: methocarbamol(ROBAXIN) IV, DISCONTD: methocarbamol, DISCONTD: metoCLOPramide (REGLAN) injection, DISCONTD: metoCLOPramide, DISCONTD: ondansetron (ZOFRAN) IV, DISCONTD: ondansetron, DISCONTD: promethazine, DISCONTD: senna-docusate, DISCONTD: sodium chloride, DISCONTD: sodium chloride, DISCONTD: zolpidem  Assessment/Plan: Principal Problem:  *Atrial fibrillation with RVR: Remains rate-controlled with a combination of Amiodarone/Digoxin, as recommended by Dr Excell Seltzer, cardiologist. Will commence Pradaxa, when ok-ed by Dr Charlann Boxer, from viewpoint of surgery.  Active Problems:  1. Hypotension: Resolved. Current BP seems baseline for patient. We shall observe today, and if still stable, will aim transfer to telemetry floor on 12/19/10. 2. Cellulitis right leg/Superficial skin necrosis and septic knee: Status post Rt AKA, now POD# 1. Continue antibiotics for now 4. E.coli UTI. On antibiotics. On Vanc/Primaxin day #5/7 5. Anemia:  Acute blood loss on chronic. Patient has a satisfactory bump in HB, s/p transfusion of 3 units PRBC on 12/17/10.   Note: Background problems remain stable. Patient is due for dental prothesis by Dr Kristin Bruins, once stable, during this hospitalization (Have discussed with Dr Kristin Bruins on 12/17/10). Will likely be stable enough, by 12/21/10. Ultimate aim is for CIR.       LOS: 4 days   Arva Slaugh,[name] 12/18/2010, 11:05 AM

## 2010-12-18 NOTE — Consult Note (Signed)
Physical Medicine and Rehabilitation Consult Reason for Consult:right AKA Referring Phsyician: Dr. Frederik Pear is an 65 y.o. female.   HPI: This is a 65 year old white female with a history of a total knee arthroplasty many years ago with subsequent infections requiring prosthetic removal and antibiotic spacer placed. She has undergone a right knee revision in the past recent tear of the quadriceps tendon noted and patient underwent quadricep tendon repair. She tolerated  well until  developed some increased redness of the incision site. She was admitted for treatment with antibiotics and wound care consult. Noted progressive changes of the incision site with superficial skin necrosis and infection she ultimately underwent right above-knee amputation with primary wound closure and the wound VAC placement on 11 8 per Dr. Charlann Boxer. Maintained on subcutaneous Lovenox for deep vein thrombosis prophylaxis. Physical and occupational therapy evaluations were requested with full evaluations  pending. Physical medicine rehabilitation was consulted for consideration of inpatient rehabilitation services.  @ROS @ Review of Systems  HENT: Negative.   Eyes: Negative for double vision.  Respiratory: Positive for shortness of breath. Negative for cough.   Cardiovascular: Positive for leg swelling. Negative for chest pain and palpitations.  Gastrointestinal: Negative for vomiting and abdominal pain.  Genitourinary: Negative.   Musculoskeletal: Positive for myalgias and joint pain.  Skin: Negative.   Neurological: Positive for weakness. Negative for dizziness and seizures.  Endo/Heme/Allergies: Negative.   Psychiatric/Behavioral: Negative.    Past Medical History  Diagnosis Date  . Emphysema   . Coronary artery disease   . Atrial fibrillation   . COPD (chronic obstructive pulmonary disease)   . History of atrial fibrillation     S/pP Ablation  . Asthma   . Chronic kidney disease     HX OF UTI    . Arthritis   . Osteoarthritis   . Rheumatoid arthritis   . Anemia   . B12 deficiency anemia   . Stroke     slight stroke in 2009-no deficits  . Neuromuscular disorder     hx of right quadricep rupture  . DJD (degenerative joint disease)   . History of benign bladder tumor    Past Surgical History  Procedure Date  . Replacement total knee   . Cardiac electrophysiology mapping and ablation   . Cholecystectomy 1996  . Cataract extraction, bilateral   . Hematoma evacuation 2009    From right groin  . Joint replacement     Left-1996, Right-1995  . Multiple tooth extractions 06/30/2010  . Trigger finger release 2011   Family History  Problem Relation Age of Onset  . Achondroplasia Father   . Congenital heart disease Mother    Social History:  reports that she quit smoking about 14 years ago. Her smoking use included Cigarettes. She has a 18 pack-year smoking history. She has never used smokeless tobacco. She reports that she does not drink alcohol or use illicit drugs. Allergies:  Allergies  Allergen Reactions  . Sulfa Antibiotics Shortness Of Breath  . Avelox (Moxifloxacin Hcl In Nacl)   . Penicillins   . Robaxin Nausea And Vomiting   Medications Prior to Admission  Medication Dose Route Frequency Provider Last Rate Last Dose  . 0.9 %  sodium chloride infusion   Intravenous Once [name] Oti 500 mL/hr at 12/15/10 0910    . 0.9 % NaCl with KCl 20 mEq/ L  infusion   Intravenous Continuous Genelle Gather Babish, PA 100 mL/hr at 12/18/10 0000 100 mL/hr at 12/18/10 0000  .  acetaminophen (TYLENOL) tablet 650 mg  650 mg Oral Q6H PRN [name] Oti   650 mg at 12/14/10 1630   Or  . acetaminophen (TYLENOL) suppository 650 mg  650 mg Rectal Q6H PRN Isidor Holts      . alum & mag hydroxide-simeth (MAALOX/MYLANTA) 200-200-20 MG/5ML suspension 30 mL  30 mL Oral Q4H PRN Genelle Gather Bab[place], PA      . amiodarone (PACERONE) tablet 200 mg  200 mg Oral Daily Micheline Chapman, MD    200 mg at 12/18/10 0917  . bisacodyl (DULCOLAX) EC tablet 10 mg  10 mg Oral Daily PRN Genelle Gather Bab[place], PA       Or  . bisacodyl (DULCOLAX) suppository 10 mg  10 mg Rectal Daily PRN Genelle Gather Bab[place], PA      . cyclobenzaprine (FLEXERIL) tablet 5 mg  5 mg Oral Q8H PRN Jamelle Rushing, PA   5 mg at 12/18/10 0738  . digoxin (LANOXIN) injection 0.125 mg  0.125 mg Intravenous Once [name] Oti   0.125 mg at 12/14/10 1741  . digoxin (LANOXIN) injection 0.25 mg  0.25 mg Intravenous STAT [name] Oti   0.25 mg at 12/14/10 1000  . digoxin (LANOXIN) tablet 0.125 mg  0.125 mg Oral Daily Marca Ancona, MD   0.125 mg at 12/18/10 0917  . diltiazem (CARDIZEM) 1 mg/mL load via infusion 10 mg  10 mg Intravenous Once [employer] Darlina Guys., PHARMD   10 mg at 12/14/10 1400  . diltiazem (CARDIZEM) 100 MG injection           . docusate sodium (COLACE) capsule 100 mg  100 mg Oral BID Genelle Gather [place], PA   100 mg at 12/18/10 1610  . enoxaparin (LOVENOX) injection 40 mg  40 mg Subcutaneous Daily Genelle Gather [place], [place]   40 mg at 12/18/10 9604  . ferrous sulfate tablet 325 mg  325 mg Oral TID PC Genelle Gather [place], [place]   325 mg at 12/18/10 5409  . furosemide (LASIX) injection 20 mg  20 mg Intravenous Once [name]   20 mg at 12/17/10 0517  . HYDROcodone-acetaminophen (NORCO) 7.5-325 MG per tablet 1-2 tablet  1-2 tablet Oral Q4H PRN Gerrit Halls, PA   2 tablet at 12/18/10 0941  . HYDROmorphone (DILAUDID) 2 MG/ML injection        1 mg at 12/14/10 1630  . HYDROmorphone (DILAUDID) injection 0.5-1 mg  0.5-1 mg Intravenous Q2H PRN Genelle Gather [place], PA   1 mg at 12/18/10 0855  . imipenem-cilastatin (PRIMAXIN) 500 mg in sodium chloride 0.9 % 100 mL IVPB  500 mg Intravenous Q6H Randall K Absher, PHARMD   500 mg at 12/18/10 0916  . menthol-cetylpyridinium (CEPACOL) lozenge 3 mg  1 lozenge Oral PRN Genelle Gather Babish, PA       Or  . phenol (CHLORASEPTIC) mouth spray 1 spray  1 spray  Mouth/Throat PRN Genelle Gather Babish, PA      . metoCLOPramide (REGLAN) tablet 5-10 mg  5-10 mg Oral Q8H PRN Genelle Gather [place], PA       Or  . metoCLOPramide (REGLAN) injection 5-10 mg  5-10 mg Intravenous Q8H PRN Genelle Gather Babish, PA      . morphine 4 MG/ML injection 4 mg  4 mg Intravenous Once Vida Roller, MD   4 mg at 12/14/10 0502  . ondansetron (ZOFRAN) tablet 4 mg  4 mg Oral Q6H PRN Gerrit Halls, PA  Or  . ondansetron (ZOFRAN) injection 4 mg  4 mg Intravenous Q6H PRN Genelle Gather Babish, PA      . polyethylene glycol (MIRALAX / GLYCOLAX) packet 17 g  17 g Oral Daily Genelle Gather [place], [place]   17 g at 12/16/10 0955  . polyethylene glycol (MIRALAX / GLYCOLAX) packet 17 g  17 g Oral Daily PRN Genelle Gather Babish, PA      . senna [facility]) tablet 8.6 mg  1 tablet Oral BID AC Genelle Gather [place], PA   8.6 mg at 12/18/10 0738  . senna-docusate (Senokot-S) tablet 1 tablet  1 tablet Oral QHS PRN Genelle Gather Babish, PA      . sodium chloride 0.9 % bolus 1,000 mL  1,000 mL Intravenous Once Vida Roller, MD   1,000 mL at 12/14/10 0501  . sodium chloride 0.9 % bolus 1,000 mL  1,000 mL Intravenous Once Vida Roller, MD   1,000 mL at 12/14/10 0727  . sodium chloride 0.9 % bolus 500 mL  500 mL Intravenous Once [name] Oti   500 mL at 12/15/10 1109  . sodium phosphate (FLEET) 7-19 GM/118ML enema 1 enema  1 enema Rectal Daily PRN Genelle Gather Babish, PA      . sodium phosphate (FLEET) 7-19 GM/118ML enema 1 enema  1 enema Rectal Daily PRN Genelle Gather Babish, PA      . vancomycin (VANCOCIN) IVPB 1000 mg/200 mL premix  1,000 mg Intravenous Q12H Randall K Absher, PHARMD   1,000 mg at 12/18/10 0152  . vitamin A & D ointment        5 application at 12/17/10 1258  . zolpidem (AMBIEN) tablet 5 mg  5 mg Oral QHS PRN Genelle Gather Babish, PA      . DISCONTD: 0.9 %  sodium chloride infusion   Intravenous Continuous [name] Oti 100 mL/hr at 12/15/10 1400    . DISCONTD: 0.9 %   sodium chloride infusion   Intravenous Continuous [name] Oti 20 mL/hr at 12/16/10 1000    . DISCONTD: 0.9 % NaCl with KCl 20 mEq/ L  infusion  100 mL/hr Intravenous Continuous Genelle Gather Babish, PA 100 mL/hr at 12/15/10 1000 100 mL/hr at 12/15/10 1000  . DISCONTD: aluminum hydroxide (ALTERNGEL) suspension 15-30 mL  15-30 mL Oral Q4H PRN Genelle Gather Babish, PA      . DISCONTD: amiodarone (PACERONE) tablet 400 mg  400 mg Oral BID Marca Ancona, MD   400 mg at 12/17/10 1113  . DISCONTD: digoxin (LANOXIN) injection 0.125 mg  0.125 mg Intravenous Daily [name] Oti   0.125 mg at 12/15/10 0915  . DISCONTD: diltiazem (CARDIZEM) injection 10 mg  10 mg Intravenous Once Vida Roller, MD      . DISCONTD: docusate sodium (COLACE) capsule 100 mg  100 mg Oral BID Genelle Gather [place], PA   100 mg at 12/16/10 0954  . DISCONTD: heparin injection 5,000 Units  5,000 Units Subcutaneous Q8H [name] Oti   5,000 Units at 12/14/10 2203  . DISCONTD: heparin injection 5,000 Units  5,000 Units Subcutaneous Q8H [name] Oti   5,000 Units at 12/16/10 2108  . DISCONTD: HYDROcodone-acetaminophen (NORCO) 5-325 MG per tablet 1-2 tablet  1-2 tablet Oral Q4H PRN [name] Oti   2 tablet at 12/17/10 1405  . DISCONTD: HYDROmorphone (DILAUDID) injection 0.25-0.5 mg  0.25-0.5 mg Intravenous Q5 min PRN Gaetano Hawthorne, MD   0.5 mg at 12/17/10 2228  . DISCONTD: HYDROmorphone (DILAUDID) injection 1 mg  1 mg Intravenous  Q4H PRN [name] Oti   1 mg at 12/17/10 2030  . DISCONTD: lactated ringers infusion   Intravenous Continuous Gaetano Hawthorne, MD 100 mL/hr at 12/17/10 1753 1,000 mL at 12/17/10 1753  . DISCONTD: methocarbamol (ROBAXIN) 500 mg in dextrose 5 % 50 mL IVPB  500 mg Intravenous Q6H PRN Genelle Gather Bab[place], PA      . DISCONTD: methocarbamol (ROBAXIN) tablet 500 mg  500 mg Oral Q6H PRN Genelle Gather Babish, PA      . DISCONTD: metoCLOPramide (REGLAN) injection 5-10 mg  5-10 mg Intravenous Q8H PRN Genelle Gather [place], PA      . DISCONTD: metoCLOPramide (REGLAN) tablet 5-10 mg  5-10 mg Oral Q8H PRN Genelle Gather [place], PA      . DISCONTD: ondansetron [facility]) injection 4 mg  4 mg Intravenous Q6H PRN Genelle Gather Babish, PA      . DISCONTD: ondansetron (ZOFRAN) tablet 4 mg  4 mg Oral Q6H PRN Genelle Gather Bab[place], PA      . DISCONTD: promethazine (PHENERGAN) injection 6.25-12.5 mg  6.25-12.5 mg Intravenous Q15 min PRN Gaetano Hawthorne, MD      . DISCONTD: rivaroxaban (XARELTO) tablet 10 mg  10 mg Oral Q24H Genelle Gather [place], [place]      . DISCONTD: senna-docusate (Senokot-S) tablet 1 tablet  1 tablet Oral QHS PRN Gerrit Halls, PA   1 tablet at 12/16/10 0831  . DISCONTD: sodium chloride 0.9 % irrigation    PRN Madlyn Frankel Olin   1,000 mL at 12/17/10 2030  . DISCONTD: sodium chloride 0.9 % irrigation    PRN Madlyn Frankel [place]lin   1,000 mL at 12/17/10 2030  . DISCONTD: zolpidem (AMBIEN) tablet 5 mg  5 mg Oral QHS PRN Genelle Gather [place], PA       No current outpatient prescriptions on file as of 12/18/2010.    Home:  senior citizen high rise and full accessible.No family for 24 hour assist. Functional History:  independent wheel chair level PTA Functional Status:  Mobility: ptis mod to max Assist with simple bed mobility and transfers.  Gait not tested.          ADL:    Cognition: Cognition Orientation Level: Oriented X4 Cognition Orientation Level: Oriented X4  Blood pressure 84/59, pulse 80, temperature 99 F (37.2 C), temperature source Core (Comment), resp. rate 17, height 5\' 11"  (1.803 m), weight 88.4 kg (194 lb 14.2 oz), SpO2 96.00%. @PHYSEXAMBYAGE2 @ Physical Exam  Constitutional: She is oriented to person, place, and time. She appears well-developed.  HENT:  Head: Normocephalic.  Eyes: Pupils are equal, round, and reactive to light.  Neck: Normal range of motion.  Cardiovascular: Regular rhythm.   Pulmonary/Chest: Breath sounds normal. She has no wheezes.  Abdominal:  Soft.  Neurological: She is alert and oriented to person, place, and time.  Skin: Skin is warm.          Vac to AKA site  Psychiatric: She has a normal mood and affect.    Results for orders placed during the hospital encounter of 12/14/10 (from the past 24 hour(s))  CBC     Status: Abnormal   Collection Time   12/18/10  3:08 AM      Component Value Range   WBC 11.5 (*) 4.0 - 10.5 (K/uL)   RBC 2.87 (*) 3.87 - 5.11 (MIL/uL)   Hemoglobin 10.1 (*) 12.0 - 15.0 (g/dL)   HCT 16.1 (*) 09.6 - 46.0 (%)   MCV 98.3  78.0 -  100.0 (fL)   MCH 35.2 (*) 26.0 - 34.0 (pg)   MCHC 35.8  30.0 - 36.0 (g/dL)   RDW 16.1 (*) 09.6 - 15.5 (%)   Platelets 254  150 - 400 (K/uL)  COMPREHENSIVE METABOLIC PANEL     Status: Abnormal   Collection Time   12/18/10  3:08 AM      Component Value Range   Sodium 128 (*) 135 - 145 (mEq/L)   Potassium 3.5  3.5 - 5.1 (mEq/L)   Chloride 95 (*) 96 - 112 (mEq/L)   CO2 26  19 - 32 (mEq/L)   Glucose, Bld 105 (*) 70 - 99 (mg/dL)   BUN 8  6 - 23 (mg/dL)   Creatinine, Ser 0.45 (*) 0.50 - 1.10 (mg/dL)   Calcium 8.2 (*) 8.4 - 10.5 (mg/dL)   Total Protein 4.7 (*) 6.0 - 8.3 (g/dL)   Albumin 1.7 (*) 3.5 - 5.2 (g/dL)   AST 82 (*) 0 - 37 (U/L)   ALT 23  0 - 35 (U/L)   Alkaline Phosphatase 104  39 - 117 (U/L)   Total Bilirubin 0.5  0.3 - 1.2 (mg/dL)   GFR calc non Af Amer >90  >90 (mL/min)   GFR calc Af Amer >90  >90 (mL/min)   No results found.  Assessment/Plan: Diagnosis: Right AKA 1. Does the need for close, 24 hr/day medical supervision in concert with the patient's rehab needs make it unreasonable for this patient to be served in a less intensive setting? Yes 2. Co-Morbidities requiring supervision/potential complications: afib, wound care(vac), pain/celluitis 3. Due to bladder management, bowel management, safety, skin/wound care, disease management, medication administration, pain management and patient education, does the patient require 24 hr/day rehab nursing?  Yes 4. Does the patient require coordinated care of a physician, rehab nurse, PT (1-2 hrs/day, 5 days/week) and OT (1-2 hrs/day, 5 days/week) to address physical and functional deficits in the context of the above medical diagnosis(es)? Yes Addressing deficits in the following areas: balance, bathing, bowel/bladder control, dressing, endurance, grooming, locomotion, strength, toileting and transferring 5. Can the patient actively participate in an intensive therapy program of at least 3 hrs of therapy per day at least 5 days per week? Yes and Potentially 6. The potential for patient to make measurable gains while on inpatient rehab is good 7. Anticipated functional outcomes upon discharge from inpatients are sup to min assist at w/c level PT, sup to  Mod assist at w/c level OT,  8. Estimated rehab length of stay to reach the above functional goals is: 2-3 weeks 9. Does the patient have adequate social supports to accommodate these discharge functional goals? Potentially 10. Anticipated D/C setting: Home 11. Anticipated post D/C treatments: HH therapy 12. Overall Rehab/Functional Prognosis: good  RECOMMENDATIONS: This patient's condition is appropriate for continued rehabilitative care in the following setting: CIR vs SNF Patient has agreed to participate in recommended program. Yes Note that insurance prior authorization may be required for reimbursement for recommended care.  Comment:Patient is very motivated. I'm concerned about her social supports.  She has some dedicated friends, but they have to be aware of her expected care needs once home if she should go to CIR.  Rehab RN to f/u.   ANGIULLI,DANIEL J. 12/18/2010

## 2010-12-19 LAB — COMPREHENSIVE METABOLIC PANEL
Albumin: 1.7 g/dL — ABNORMAL LOW (ref 3.5–5.2)
Alkaline Phosphatase: 79 U/L (ref 39–117)
BUN: 5 mg/dL — ABNORMAL LOW (ref 6–23)
Chloride: 99 mEq/L (ref 96–112)
Creatinine, Ser: 0.5 mg/dL (ref 0.50–1.10)
GFR calc Af Amer: >90 mL/min (ref >90)
GFR calc non Af Amer: >90 mL/min (ref >90)
Glucose, Bld: 100 mg/dL — ABNORMAL HIGH (ref 70–99)
Potassium: 3.9 mEq/L (ref 3.5–5.1)
Total Bilirubin: 0.3 mg/dL (ref 0.3–1.2)

## 2010-12-19 MED ORDER — SODIUM CHLORIDE 0.9 % IV BOLUS (SEPSIS)
500.0000 mL | Freq: Once | INTRAVENOUS | Status: AC
  Administered 2010-12-19: 500 mL via INTRAVENOUS

## 2010-12-19 MED ORDER — CYCLOBENZAPRINE HCL 10 MG PO TABS
10.0000 mg | ORAL_TABLET | Freq: Three times a day (TID) | ORAL | Status: DC | PRN
  Administered 2010-12-19 – 2010-12-23 (×6): 10 mg via ORAL
  Filled 2010-12-19 (×6): qty 1

## 2010-12-19 MED ORDER — SODIUM CHLORIDE 0.9 % IV SOLN
Freq: Once | INTRAVENOUS | Status: AC
  Administered 2010-12-19: 500 mL via INTRAVENOUS

## 2010-12-19 MED ORDER — HYDROCODONE-ACETAMINOPHEN 7.5-325 MG PO TABS
ORAL_TABLET | ORAL | Status: AC
  Administered 2010-12-19: 2 via ORAL
  Filled 2010-12-19: qty 2

## 2010-12-19 NOTE — Progress Notes (Signed)
Subjective: Did well overnight, apart from some pain issues, which have been addressed by orthopedic team. Needed saline bolus in early am, for low BP.  Objective: Vital signs in last 24 hours: Temp:  [98.2 F (36.8 C)-99.5 F (37.5 C)] 98.2 F (36.8 C) (11/10 0800) Pulse Rate:  [74-89] 74  (11/10 0800) Resp:  [19-21] 21  (11/10 0800) BP: (84-98)/(45-59) 98/50 mmHg (11/10 0800) SpO2:  [95 %-96 %] 96 % (11/10 0800) Weight change:  Last BM Date: 12/16/10  Intake/Output from previous day: 11/09 0701 - 11/10 0700 In: 4120 [P.O.:1320; I.V.:2100; IV Piggyback:700] Out: 2280 [Urine:2280] Total I/O In: 100 [I.V.:100] Out: 140 [Urine:140]   Physical Exam: General: Comfortable, alert, communicative, quite garrulous fully oriented, not short of breath at rest.  HEENT: Mild clinical pallor, no jaundice, no conjunctival injection or discharge.  NECK: Supple, JVP not seen, no carotid bruits, no palpable lymphadenopathy, no palpable goiter.  CHEST: Clinically clear to auscultation, no wheezes, no crackles.  HEART: Sounds 1 and 2 heard, normal, irregular, no murmurs.  ABDOMEN: Soft, non-tender, no palpable organomegaly, no palpable masses, normal bowel sounds.  LOWER EXTREMITIES: RLE stump under dressings..  MUSCULOSKELETAL SYSTEM: Generalized osteoarthritic changes, otherwise, normal.  CENTRAL NERVOUS SYSTEM: No focal neurologic deficit on gross examination.   Lab Results:  [facility] 12/18/10 0308 12/17/10 0103  WBC 11.5* 9.2  HGB 10.1* 8.1*  HCT 28.2* 22.7*  PLT 254 250    Basename 12/19/10 0422 12/18/10 0308  NA 131* 128*  K 3.9 3.5  CL 99 95*  CO2 29 26  GLUCOSE 100* 105*  BUN 5* 8  CREATININE 0.50 0.43*  CALCIUM 7.7* 8.2*   Recent Results (from the past 240 hour(s))  CULTURE, BLOOD (ROUTINE X 2)     Status: Normal (Preliminary result)   Collection Time   12/14/10  3:15 AM      Component Value Range Status Comment   Specimen Description BLOOD LEFT ARM   Final    Special  Requests BOTTLES DRAWN AEROBIC AND ANAEROBIC 5CC   Final    Setup Time [id number]   Final    Culture     Final    Value:        BLOOD CULTURE RECEIVED NO GROWTH TO DATE CULTURE WILL BE HELD FOR 5 DAYS BEFORE ISSUING A FINAL NEGATIVE REPORT   Report Status PENDING   Incomplete   URINE CULTURE     Status: Normal   Collection Time   12/14/10  3:32 AM      Component Value Range Status Comment   Specimen Description URINE, CATHETERIZED   Final    Special Requests NONE   Final    Setup Time [id number]   Final    Colony Count >=100,000 COLONIES/ML   Final    Culture ESCHERICHIA COLI   Final    Report Status 12/16/2010 FINAL   Final    Organism ID, Bacteria ESCHERICHIA COLI   Final   WOUND CULTURE     Status: Normal   Collection Time   12/16/10  4:08 PM      Component Value Range Status Comment   Specimen Description LEG   Final    Special Requests Normal   Final    Gram Stain     Final    Value: MODERATE WBC PRESENT, PREDOMINANTLY PMN     NO SQUAMOUS EPITHELIAL CELLS SEEN     NO ORGANISMS SEEN   Culture FEW STAPHYLOCOCCUS SPECIES (COAGULASE NEGATIVE)   Final  Report Status 12/18/2010 FINAL   Final      Studies/Results: No results found.  Medications: Scheduled Meds:   . amiodarone  200 mg Oral Daily  . digoxin  0.125 mg Oral Daily  . docusate sodium  100 mg Oral BID  . enoxaparin  40 mg Subcutaneous Daily  . ferrous sulfate  325 mg Oral TID PC  . imipenem-cilastatin  500 mg Intravenous Q6H  . polyethylene glycol  17 g Oral Daily  . senna  1 tablet Oral BID AC  . sodium chloride  500 mL Intravenous Once  . vancomycin  1,000 mg Intravenous Q12H   Continuous Infusions:   . 0.9 % NaCl with KCl 20 mEq / L 100 mL/hr (12/19/10 0113)   PRN Meds:.acetaminophen, acetaminophen, alum & mag hydroxide-simeth, bisacodyl, bisacodyl, cyclobenzaprine, HYDROcodone-acetaminophen, HYDROmorphone, menthol-cetylpyridinium, metoCLOPramide (REGLAN) injection, metoCLOPramide, ondansetron  (ZOFRAN) IV, ondansetron, phenol, polyethylene glycol, senna-docusate, sodium phosphate, sodium phosphate, zolpidem, DISCONTD: cyclobenzaprine  Assessment/Plan: Principal Problem:  *Atrial fibrillation with RVR: Rate-controlled on Amiodarone/Digoxin, as recommended by Dr Excell Seltzer, cardiologist. Will commence Pradaxa, when ok-ed by Dr Charlann Boxer, from viewpoint of surgery.  Active Problems:  1. Hypotension: Resolved. Had hypotension overnight, addressed with saline bolus. BP is low again this am, with SBP of 87, in spite of maintenance ivi saline. Will bolus prn. 2. Cellulitis right leg/Superficial skin necrosis and septic knee: Status post Rt AKA, now POD# 2. Continue antibiotics for now  4. E.coli UTI. On antibiotics. On Vanc/Primaxin day #6/7  5. Anemia: Acute blood loss on chronic. HB is stable/reasonable s/p transfusion of 3 units PRBC on 12/17/10.   Note: Background problems remain stable. Patient is due for dental prothesis by Dr Kristin Bruins, once stable, during this hospitalization (Have discussed with Dr Kristin Bruins on 12/17/10). Hopefully, will be stable enough, by 12/21/10. Ultimate aim is for CIR.       LOS: 5 days   [name],[name] 12/19/2010, 8:53 AM

## 2010-12-19 NOTE — Progress Notes (Signed)
  POD # 2 from Right AKA  Subjective: Doing fine. No events or complications.  Pain fairly well controlled.   Objective: Vital signs in last 24 hours: Temp:  [97.3 F (36.3 C)-99.3 F (37.4 C)] 99.3 F (37.4 C) (11/10 1900) Pulse Rate:  [74-94] 84  (11/10 1900) Resp:  [15-23] 20  (11/10 1900) BP: (88-98)/(47-66) 94/56 mmHg (11/10 1900) SpO2:  [94 %-97 %] 97 % (11/10 1900)   Basename 12/18/10 0308 12/17/10 0103  HGB 10.1* 8.1*    Basename 12/18/10 0308 12/17/10 0103  WBC 11.5* 9.2  RBC 2.87* 2.15*  HCT 28.2* 22.7*  PLT 254 250    Basename 12/19/10 0422 12/18/10 0308  NA 131* 128*  K 3.9 3.5  CL 99 95*  CO2 29 26  BUN 5* 8  CREATININE 0.50 0.43*  GLUCOSE 100* 105*  CALCIUM 7.7* 8.2*   No results found for this basename: LABPT:2,INR:2 in the last 72 hours  WOund vac with good suction.  wound otherwise healthy appearing  Assessment/Plan: PT/OT  Wound vac change per wound team.  Dispo pending further eval from SW   [name] 12/19/2010, 11:45 PM

## 2010-12-19 NOTE — Progress Notes (Signed)
Pt Alert x 3 with low bp monitor readings. Bp run between SBP of 80- 72, DBP 54 - 30s. MD Deterding  Made  aware order  given for NS 500 cc bolus will continue  To monitor.

## 2010-12-19 NOTE — Progress Notes (Signed)
Physical Therapy Treatment Patient Details Name: [name] MRN: [id number] DOB: September 25, 1945 Today's Date: 12/19/2010 Time: 7829-5621   2 TE PT Assessment/Plan  PT - Assessment/Plan Comments on Treatment Session: Pt requested bed exercises only today. Difficulties with blood pressure and BP still low in supine-95/57. PT Plan: Discharge plan remains appropriate PT Goals  Acute Rehab PT Goals PT Goal: Supine/Side to Sit - Progress: Progressing toward goal PT Goal: Sit to Supine/Side - Progress: Progressing toward goal PT Transfer Goal: Bed to Chair/Chair to Bed - Progress: Progressing toward goal PT Goal: Stand - Progress: Progressing toward goal PT Goal: Propel Wheelchair - Progress: Progressing toward goal  PT Treatment Precautions/Restrictions  Precautions Precautions: Fall Restrictions Weight Bearing Restrictions: Yes RLE Weight Bearing: Non weight bearing LLE Weight Bearing: Weight bearing as tolerated Mobility (including Balance) Bed Mobility Bed Mobility: No Transfers Transfers: No    Exercise  General Exercises - Lower Extremity Ankle Circles/Pumps: AROM;Left;15 reps;Supine Quad Sets: Strengthening;Left;15 reps;Supine;AROM Short Arc Quad: Strengthening;AROM;Left;Supine;Other reps (comment) (15) Heel Slides: AROM;Strengthening;Left;15 reps Hip ABduction/ADduction: AROM;Strengthening;Left;15 reps;Other (comment) (Right 10 reps, active-assist) Straight Leg Raises: Strengthening;AAROM;Left;15 reps Hip Flexion/Marching: Supine;AAROM;Strengthening;Right;10 reps End of Session PT - End of Session Activity Tolerance: Patient limited by pain Patient left: in bed;with call bell in reach General Behavior During Session: [facility] for tasks performed Cognition: [facility] for tasks performed  Rebeca Alert [facility] 12/19/2010, 12:36 PM

## 2010-12-19 NOTE — Progress Notes (Signed)
eLink Physician-Brief Progress Note Patient Name: [name] DOB: 03/14/1945 MRN: [id number]  Date of Service  12/19/2010   HPI/Events of Note   Hypotension  eICU Interventions  Patient is alert and oriented in NAD and making urine but BP which had been in the 80s systolic is now in the 70s.   Plan: 500 cc NS bolus for hypotension      Ernie Sagrero 12/19/2010, 3:42 AM

## 2010-12-20 LAB — BASIC METABOLIC PANEL
CO2: 28 mEq/L (ref 19–32)
Chloride: 97 mEq/L (ref 96–112)
Creatinine, Ser: 0.44 mg/dL — ABNORMAL LOW (ref 0.50–1.10)
GFR calc Af Amer: >90 mL/min (ref >90)
Potassium: 4.2 mEq/L (ref 3.5–5.1)
Sodium: 130 mEq/L — ABNORMAL LOW (ref 135–145)

## 2010-12-20 LAB — CBC
MCV: 101.3 fL — ABNORMAL HIGH (ref 78.0–100.0)
Platelets: 321 10*3/uL (ref 150–400)
RBC: 3.04 MIL/uL — ABNORMAL LOW (ref 3.87–5.11)
RDW: 17.4 % — ABNORMAL HIGH (ref 11.5–15.5)
WBC: 7.5 10*3/uL (ref 4.0–10.5)

## 2010-12-20 LAB — CULTURE, BLOOD (ROUTINE X 2): Culture: NO GROWTH

## 2010-12-20 MED ORDER — HYDROCODONE-ACETAMINOPHEN 7.5-325 MG PO TABS
ORAL_TABLET | ORAL | Status: AC
  Administered 2010-12-20: 2 via ORAL
  Filled 2010-12-20: qty 2

## 2010-12-20 NOTE — Progress Notes (Signed)
  Subjective:  Complains of pain at AKA site overnight. No chest pain, dyspnea, palps.  Objective:  Vital Signs in the last 24 hours: Temp:  [97.3 F (36.3 C)-99.3 F (37.4 C)] 99 F (37.2 C) (11/11 0000) Pulse Rate:  [78-94] 84  (11/10 1900) Resp:  [15-23] 20  (11/10 1900) BP: (88-96)/(47-66) 94/56 mmHg (11/10 1900) SpO2:  [94 %-97 %] 97 % (11/10 1900) Weight:  [199 lb 4.7 oz (90.4 kg)] 199 lb 4.7 oz (90.4 kg) (11/11 0000)  Intake/Output from previous day: 11/10 0701 - 11/11 0700 In: 4510 [P.O.:1160; I.V.:2550; IV Piggyback:800] Out: 2760 [Urine:2760]  Physical Exam: Pt is alert and oriented, NAD HEENT: normal Neck: JVP - normal, carotids 2+= without bruits Lungs: decreased air movement bilaterally CV: irregularly irregular without murmur or gallop Abd: soft, NT, Positive BS, no hepatomegaly Ext: right AKA, no left leg edema Skin: warm/dry no rash   Lab Results:  Basename 12/20/10 0331 12/18/10 0308  WBC 7.5 11.5*  HGB 10.8* 10.1*  PLT 321 254    Basename 12/20/10 0331 12/19/10 0422  NA 130* 131*  K 4.2 3.9  CL 97 99  CO2 28 29  GLUCOSE 88 100*  BUN 4* 5*  CREATININE 0.44* 0.50    Tele: Atrial fib HR with rate adequately contolled.  Assessment/Plan:  PAF - has been in AF throughout hospital stay, rate well-controlled. Would continue Amiodarone at 200 mg daily. Continue digoxin. Would avoid beta-blocker/CCB for now as BP remains low. Recommend resume pradaxa and discontinue lovenox when pradaxa is started. Would do this when ok to anticoagulate from a post-operative perspective. If atrial fibrillation persists when she sees her cardiologist back, elective DCCV could be considered after she has been on pradaxa for 3 consecutive weeks.  **The patient has been followed by Dr Chales Abrahams in [facility] for many years. She has been told that she will likely need PPM if recurrent AFib because she does not tolerate AF well. She will need close follow-up with him after she  returns home from rehab facility** Would arrange FU with him 2-4 weeks after DC. We will sign off; please call with questions.   Olga Millers, M.D. 12/20/2010, 8:54 AM

## 2010-12-20 NOTE — Progress Notes (Signed)
Subjective: Did well overnight. No active pain issues and BP has remained stable.  Objective: Vital signs in last 24 hours: Temp:  [97.3 F (36.3 C)-99.3 F (37.4 C)] 99 F (37.2 C) (11/11 0000) Pulse Rate:  [78-94] 84  (11/10 1900) Resp:  [15-23] 20  (11/10 1900) BP: (88-96)/(47-66) 94/56 mmHg (11/10 1900) SpO2:  [94 %-97 %] 97 % (11/10 1900) Weight:  [90.4 kg (199 lb 4.7 oz)] 199 lb 4.7 oz (90.4 kg) (11/11 0000) Weight change:  Last BM Date: 12/16/10  Intake/Output from previous day: 11/10 0701 - 11/11 0700 In: 4510 [P.O.:1160; I.V.:2550; IV Piggyback:800] Out: 2760 [Urine:2760]     Physical Exam: General: Comfortable, alert, communicative, continues to be garrulous fully oriented, not short of breath at rest.  HEENT: Mild clinical pallor, no jaundice, no conjunctival injection or discharge.  NECK: Supple, JVP not seen, no carotid bruits, no palpable lymphadenopathy, no palpable goiter.  CHEST: Clinically clear to auscultation, no wheezes, no crackles.  HEART: Sounds 1 and 2 heard, normal, irregular, no murmurs.  ABDOMEN: Soft, non-tender, no palpable organomegaly, no palpable masses, normal bowel sounds.  LOWER EXTREMITIES: RLE stump under dressings..  MUSCULOSKELETAL SYSTEM: Generalized osteoarthritic changes, otherwise, normal.  CENTRAL NERVOUS SYSTEM: No focal neurologic deficit on gross examination.   Lab Results:  [facility] 12/20/10 0331 12/18/10 0308  WBC 7.5 11.5*  HGB 10.8* 10.1*  HCT 30.8* 28.2*  PLT 321 254    Basename 12/20/10 0331 12/19/10 0422  NA 130* 131*  K 4.2 3.9  CL 97 99  CO2 28 29  GLUCOSE 88 100*  BUN 4* 5*  CREATININE 0.44* 0.50  CALCIUM 8.2* 7.7*   Recent Results (from the past 240 hour(s))  CULTURE, BLOOD (ROUTINE X 2)     Status: Normal (Preliminary result)   Collection Time   12/14/10  3:15 AM      Component Value Range Status Comment   Specimen Description BLOOD LEFT ARM   Final    Special Requests BOTTLES DRAWN AEROBIC AND  ANAEROBIC 5CC   Final    Setup Time [id number]   Final    Culture     Final    Value:        BLOOD CULTURE RECEIVED NO GROWTH TO DATE CULTURE WILL BE HELD FOR 5 DAYS BEFORE ISSUING A FINAL NEGATIVE REPORT   Report Status PENDING   Incomplete   URINE CULTURE     Status: Normal   Collection Time   12/14/10  3:32 AM      Component Value Range Status Comment   Specimen Description URINE, CATHETERIZED   Final    Special Requests NONE   Final    Setup Time [id number]   Final    Colony Count >=100,000 COLONIES/ML   Final    Culture ESCHERICHIA COLI   Final    Report Status 12/16/2010 FINAL   Final    Organism ID, Bacteria ESCHERICHIA COLI   Final   WOUND CULTURE     Status: Normal   Collection Time   12/16/10  4:08 PM      Component Value Range Status Comment   Specimen Description LEG   Final    Special Requests Normal   Final    Gram Stain     Final    Value: MODERATE WBC PRESENT, PREDOMINANTLY PMN     NO SQUAMOUS EPITHELIAL CELLS SEEN     NO ORGANISMS SEEN   Culture FEW STAPHYLOCOCCUS SPECIES (COAGULASE NEGATIVE)   Final  Report Status 12/18/2010 FINAL   Final      Studies/Results: No results found.  Medications: Scheduled Meds:    . sodium chloride   Intravenous Once  . amiodarone  200 mg Oral Daily  . digoxin  0.125 mg Oral Daily  . docusate sodium  100 mg Oral BID  . enoxaparin  40 mg Subcutaneous Daily  . ferrous sulfate  325 mg Oral TID PC  . imipenem-cilastatin  500 mg Intravenous Q6H  . polyethylene glycol  17 g Oral Daily  . senna  1 tablet Oral BID AC  . vancomycin  1,000 mg Intravenous Q12H   Continuous Infusions:    . 0.9 % NaCl with KCl 20 mEq / L 100 mL/hr at 12/20/10 0221   PRN Meds:.acetaminophen, acetaminophen, alum & mag hydroxide-simeth, bisacodyl, bisacodyl, cyclobenzaprine, HYDROcodone-acetaminophen, HYDROmorphone, menthol-cetylpyridinium, metoCLOPramide (REGLAN) injection, metoCLOPramide, ondansetron (ZOFRAN) IV, ondansetron, phenol,  polyethylene glycol, senna-docusate, sodium phosphate, sodium phosphate, zolpidem  Assessment/Plan: Principal Problem:  *Atrial fibrillation with RVR: Rate-controlled on Amiodarone/Digoxin. Cardiology input much appreciated. Still awaiting orthopedic go-ahead, re commencement of Pradaxa, from viewpoint of surgery.  Active Problems:  1. Hypotension: Resolved. Will discontinue ivi fluids, and monitor.. 2. Cellulitis right leg/Superficial skin necrosis and septic knee: Status post Rt AKA, now POD# 3. Continue antibiotics for now  4. E.coli UTI. On antibiotics. On Vanc/Primaxin day #7. Fully treated. Will continue antibiotics, till discussed with orthopedics, from viewpoint of orthopedic issues.  5. Anemia: Acute blood loss on chronic. HB remains stable/reasonable s/p transfusion of 3 units PRBC on 12/17/10.   Note: Background problems remain stable. Patient is due for dental prothesis by Dr Kristin Bruins, once stable, during this hospitalization (Have discussed with Dr Kristin Bruins on 12/17/10). Hopefully, will be stable enough, by 12/21/10. Ultimate aim is for CIR.   Comment: If BP remains stable off iv fluids, will likely transfer to telemetry.      LOS: 6 days   [name],[name] 12/20/2010, 9:29 AM

## 2010-12-21 ENCOUNTER — Encounter (HOSPITAL_COMMUNITY): Payer: Self-pay | Admitting: Orthopedic Surgery

## 2010-12-21 DIAGNOSIS — K08109 Complete loss of teeth, unspecified cause, unspecified class: Secondary | ICD-10-CM

## 2010-12-21 LAB — CBC
HCT: 30.8 % — ABNORMAL LOW (ref 36.0–46.0)
Hemoglobin: 10.5 g/dL — ABNORMAL LOW (ref 12.0–15.0)
MCV: 102.3 fL — ABNORMAL HIGH (ref 78.0–100.0)
RBC: 3.01 MIL/uL — ABNORMAL LOW (ref 3.87–5.11)
RDW: 17 % — ABNORMAL HIGH (ref 11.5–15.5)
WBC: 6.2 10*3/uL (ref 4.0–10.5)

## 2010-12-21 LAB — BASIC METABOLIC PANEL
BUN: 5 mg/dL — ABNORMAL LOW (ref 6–23)
CO2: 29 mEq/L (ref 19–32)
Chloride: 98 mEq/L (ref 96–112)
Creatinine, Ser: 0.57 mg/dL (ref 0.50–1.10)
GFR calc Af Amer: >90 mL/min (ref >90)
Potassium: 4.2 mEq/L (ref 3.5–5.1)

## 2010-12-21 MED ORDER — DABIGATRAN ETEXILATE MESYLATE 150 MG PO CAPS
150.0000 mg | ORAL_CAPSULE | Freq: Two times a day (BID) | ORAL | Status: DC
  Administered 2010-12-22 – 2010-12-28 (×13): 150 mg via ORAL
  Filled 2010-12-21 (×14): qty 1

## 2010-12-21 MED ORDER — POLYETHYLENE GLYCOL 3350 17 G PO PACK: 17.0000 g | PACK | Freq: Every day | ORAL | Status: DC | PRN

## 2010-12-21 MED ORDER — ALUM & MAG HYDROXIDE-SIMETH 400-400-40 MG/5ML PO SUSP: 30.0000 mL | ORAL | Status: DC | PRN

## 2010-12-21 MED ORDER — CHLORHEXIDINE GLUCONATE 0.12 % MT SOLN
15.0000 mL | Freq: Two times a day (BID) | OROMUCOSAL | Status: DC
  Administered 2010-12-21 – 2010-12-28 (×14): 15 mL via OROMUCOSAL
  Filled 2010-12-21 (×17): qty 15

## 2010-12-21 MED ORDER — HYDROCODONE-ACETAMINOPHEN 5-325 MG PO TABS: 1.0000 | ORAL_TABLET | ORAL | Status: DC | PRN

## 2010-12-21 MED ORDER — SENNOSIDES-DOCUSATE SODIUM 8.6-50 MG PO TABS: 2.0000 | ORAL_TABLET | Freq: Every day | ORAL | Status: DC

## 2010-12-21 MED ORDER — ENOXAPARIN SODIUM 40 MG/0.4ML ~~LOC~~ SOLN: 40.0000 mg | SUBCUTANEOUS | Status: DC

## 2010-12-21 MED ORDER — VANCOMYCIN HCL IN DEXTROSE 1-5 GM/200ML-% IV SOLN: 1000.0000 mg | Freq: Two times a day (BID) | INTRAVENOUS | Status: DC

## 2010-12-21 MED ORDER — SENNOSIDES-DOCUSATE SODIUM 8.6-50 MG PO TABS: 1.0000 | ORAL_TABLET | Freq: Every evening | ORAL | Status: DC | PRN

## 2010-12-21 MED ORDER — PROMETHAZINE HCL 25 MG/ML IJ SOLN: 12.5000 mg | Freq: Four times a day (QID) | INTRAMUSCULAR | Status: DC | PRN

## 2010-12-21 MED ORDER — ALUM & MAG HYDROXIDE-SIMETH 200-200-20 MG/5ML PO SUSP: 30.0000 mL | ORAL | Status: DC | PRN

## 2010-12-21 MED ORDER — GUAIFENESIN-DM 100-10 MG/5ML PO SYRP: 5.0000 mL | ORAL_SOLUTION | Freq: Four times a day (QID) | ORAL | Status: DC | PRN

## 2010-12-21 MED ORDER — ACETAMINOPHEN 325 MG PO TABS: 650.0000 mg | ORAL_TABLET | Freq: Four times a day (QID) | ORAL | Status: DC | PRN

## 2010-12-21 MED ORDER — PROMETHAZINE HCL 25 MG RE SUPP: 12.5000 mg | Freq: Four times a day (QID) | RECTAL | Status: DC | PRN

## 2010-12-21 MED ORDER — FERROUS SULFATE 325 (65 FE) MG PO TABS: 325.0000 mg | ORAL_TABLET | Freq: Three times a day (TID) | ORAL | Status: DC

## 2010-12-21 MED ORDER — BISACODYL 10 MG RE SUPP: 10.0000 mg | Freq: Every day | RECTAL | Status: DC | PRN

## 2010-12-21 MED ORDER — ACETAMINOPHEN 650 MG RE SUPP: 650.0000 mg | Freq: Four times a day (QID) | RECTAL | Status: DC | PRN

## 2010-12-21 MED ORDER — DIPHENHYDRAMINE HCL 25 MG PO CAPS
25.0000 mg | ORAL_CAPSULE | Freq: Four times a day (QID) | ORAL | Status: DC | PRN
  Administered 2010-12-21 – 2010-12-23 (×3): 25 mg via ORAL
  Filled 2010-12-21 (×3): qty 1

## 2010-12-21 MED ORDER — DIGOXIN 125 MCG PO TABS: 0.1250 mg | ORAL_TABLET | Freq: Every day | ORAL | Status: DC

## 2010-12-21 MED ORDER — HYDROCODONE-ACETAMINOPHEN 7.5-325 MG PO TABS: 1.0000 | ORAL_TABLET | ORAL | Status: DC | PRN

## 2010-12-21 MED ORDER — AMIODARONE HCL 200 MG PO TABS: 200.0000 mg | ORAL_TABLET | Freq: Every day | ORAL | Status: DC

## 2010-12-21 MED ORDER — ENOXAPARIN SODIUM 40 MG/0.4ML ~~LOC~~ SOLN: 40.0000 mg | Freq: Every day | SUBCUTANEOUS | Status: DC

## 2010-12-21 MED ORDER — PROMETHAZINE HCL 25 MG PO TABS: 12.5000 mg | ORAL_TABLET | Freq: Four times a day (QID) | ORAL | Status: DC | PRN

## 2010-12-21 MED ORDER — TRAZODONE 25 MG HALF TABLET: 25.0000 mg | ORAL_TABLET | Freq: Every evening | ORAL | Status: DC | PRN

## 2010-12-21 MED ORDER — SORBITOL 70 % SOLN: 30.0000 mL | Freq: Two times a day (BID) | Status: DC | PRN

## 2010-12-21 NOTE — Progress Notes (Signed)
SPOKE TO MD/SW ABOUT INPT REHAB OUTCOME.NOW FOR SNF. 

## 2010-12-21 NOTE — Progress Notes (Signed)
  POD # 4 from Right AKA  Subjective: Doing fairly well.  Tolerating post op course fine, no recent events   Objective: Vital signs in last 24 hours: Temp:  [97.6 F (36.4 C)-99.7 F (37.6 C)] 97.8 F (36.6 C) (11/12 0616) Pulse Rate:  [78-90] 78  (11/12 0616) Resp:  [17-22] 18  (11/12 0616) BP: (94-104)/(48-79) 94/60 mmHg (11/12 0616) SpO2:  [90 %-97 %] 94 % (11/12 0616)   Basename 12/21/10 0457 12/20/10 0331  HGB 10.5* 10.8*    Basename 12/21/10 0457 12/20/10 0331  WBC 6.2 7.5  RBC 3.01* 3.04*  HCT 30.8* 30.8*  PLT 344 321    Basename 12/21/10 0457 12/20/10 0331  NA 131* 130*  K 4.2 4.2  CL 98 97  CO2 29 28  BUN 5* 4*  CREATININE 0.57 0.44*  GLUCOSE 85 88  CALCIUM 8.4 8.2*   No results found for this basename: LABPT:2,INR:2 in the last 72 hours  Right leg wound vac in place with good suction.  Assessment/Plan:  Wound nurse to change vac sponge today.   Continue IV VANCO until wound fully sealed and vac changes no longer necessary Rehab followed by SNF RTC 2 weeks, Dannis Deroche, [phone] for staple removal and wound check   [name] D 12/21/2010, 9:36 AM

## 2010-12-21 NOTE — Progress Notes (Signed)
SPOKE TO MD/SW ABOUT INPT REHAB OUTCOME.NOW FOR SNF.

## 2010-12-21 NOTE — Progress Notes (Signed)
Per Rehab MD consult dated 12/18/10, pt was potentially a good CIR candidate, but would definitely require physical assistance functionally and with ADLs after a short CIR stay. Patient states, she wouldn't have anyone to assist her once home and was agreeable to SNF placement for rehab based on this. I informed CM, Lanier Clam and SW, Asher Muir in regards to CIR decision and patient's wishes. Thanks for consult.  Ralph Dowdy, RN [phone]

## 2010-12-21 NOTE — Progress Notes (Signed)
BP:    94/60         P:   78    Resp: 16         T: 97.8  CC: "I want to have my dentures inserted"  History of present illness:  [name] is a patient known from dental medicine clinic. Patient has been followed for fabrication of upper and lower complete dentures. Patient subsequently developed problems with her previous knee replacement. Patient hospitalized at [facility]. After discussion with Dr. Ricke Hey, the patient was cleared for insertion of upper lower complete dentures today. [name] now presents for insertion of upper and lower complete dentures.  Procedure: Pressure indicating paste applied to upper and lower complete dentures. Adjustments made as needed. [place]. Occlusion evaluated and adjustments made as needed for centric relation and protrusive strokes. Good esthetics, phonetics, fit, and function noted. Pt. accepts results. Postop instructions were provided in a written and verbal form on use and care of upper and lower complete dentures. Gave patient denture brush and cup. Pt. to keep upper and lower dentures out if sore spots develop. Patient cautioned about extensive undercuts in her bone. Patient is aware of the potential for denture irritation leading to bone exposure with significant denture irritation.  Patient is to contact dental medicine at the first sign of denture irritation or exposed bone. Patient given significant instructions on use and care of the dentures. Patient given specific instructions about how to avoid denture irritation. Patient is to use salt water rinses as needed to aid healing. Return to clinic as scheduled for denture adjustment. Call if problems arise before then. Pt. dismissed in stable condition and is to return to her hospital bed  at [facility] long.. Dr. Cindra Eves

## 2010-12-21 NOTE — Consult Note (Signed)
WOC consult Note Reason for Consult:first post op dressing change to right stump wound. Wound type:surgical Measurement: wound measures 7 x 4.5 x 2.5cm.  There is a 7cm stapled incision line with 4cm of ecchymotic tissue at the most proximal point (nearest the wound).   Wound WGN:FAOZ/HYQ Drainage (amount, consistency, odor) minimal; serosanguinous. Periwound: intact.  See mention of staple line. Dressing procedure/placement/frequency: NPWT dressing changed ([employer] V.A.C.).  Using small piece of black granufoam.  Settings are 125 mmHg continuous pressure. Patient uncomfortable, tearful, but relaxed as soon as procedure completed.  States it is "half pain half anticipation".   Has had V.A.C. Therapy previously and understands that dressings will be changed every M-W-F. Staff to change.   I will not follow, but will remain available.  Please re-consult if needed. Thanks, Ladona Mow, MSN, RN, [facility], CWOCN [phone])

## 2010-12-21 NOTE — Progress Notes (Signed)
ANTIBIOTIC CONSULT NOTE - FOLLOW UP  Pharmacy Consult for Vancomycin and Primaxin Indication: Chronic TKA infection  Allergies  Allergen Reactions  . Sulfa Antibiotics Shortness Of Breath  . Avelox (Moxifloxacin Hcl In Nacl)   . Penicillins   . Robaxin Nausea And Vomiting    Patient Measurements: Height: 5\' 11"  (180.3 cm) Weight: 199 lb 4.7 oz (90.4 kg) IBW/kg (Calculated) : 70.8    Vital Signs: Temp: 97.8 F (36.6 C) (11/12 0616) Temp src: Oral (11/12 0616) BP: 94/60 mmHg (11/12 0616) Pulse Rate: 78  (11/12 0616) Intake/Output from previous day: 11/11 0701 - 11/12 0700 In: 1632 [P.O.:680; I.V.:150; IV Piggyback:802] Out: 3785 [Urine:3785] Intake/Output from this shift:    Labs:  Basename 12/21/10 0457 12/20/10 0331 12/19/10 0422  WBC 6.2 7.5 --  HGB 10.5* 10.8* --  PLT 344 321 --  LABCREA -- -- --  CREATININE 0.57 0.44* 0.50   Estimated Creatinine Clearance: 87 ml/min (by C-G formula based on Cr of 0.57). No results found for this basename: VANCOTROUGH:2,VANCOPEAK:2,VANCORANDOM:2,GENTTROUGH:2,GENTPEAK:2,GENTRANDOM:2,TOBRATROUGH:2,TOBRAPEAK:2,TOBRARND:2,AMIKACINPEAK:2,AMIKACINTROU:2,AMIKACIN:2, in the last 72 hours   Microbiology: Recent Results (from the past 720 hour(s))  SURGICAL PCR SCREEN     Status: Normal   Collection Time   12/08/10 11:45 AM      Component Value Range Status Comment   MRSA, PCR NEGATIVE  NEGATIVE  Final    Staphylococcus aureus NEGATIVE  NEGATIVE  Final   CULTURE, BLOOD (ROUTINE X 2)     Status: Normal   Collection Time   12/14/10  3:15 AM      Component Value Range Status Comment   Specimen Description BLOOD LEFT ARM   Final    Special Requests BOTTLES DRAWN AEROBIC AND ANAEROBIC 5CC   Final    Setup Time [id number]   Final    Culture NO GROWTH 5 DAYS   Final    Report Status 12/20/2010 FINAL   Final   URINE CULTURE     Status: Normal   Collection Time   12/14/10  3:32 AM      Component Value Range Status Comment   Specimen  Description URINE, CATHETERIZED   Final    Special Requests NONE   Final    Setup Time [id number]   Final    Colony Count >=100,000 COLONIES/ML   Final    Culture ESCHERICHIA COLI   Final    Report Status 12/16/2010 FINAL   Final    Organism ID, Bacteria ESCHERICHIA COLI   Final   WOUND CULTURE     Status: Normal   Collection Time   12/16/10  4:08 PM      Component Value Range Status Comment   Specimen Description LEG   Final    Special Requests Normal   Final    Gram Stain     Final    Value: MODERATE WBC PRESENT, PREDOMINANTLY PMN     NO SQUAMOUS EPITHELIAL CELLS SEEN     NO ORGANISMS SEEN   Culture FEW STAPHYLOCOCCUS SPECIES (COAGULASE NEGATIVE)   Final    Report Status 12/18/2010 FINAL   Final     Anti-infectives     Start     Dose/Rate Route Frequency Ordered Stop   12/14/10 1200   imipenem-cilastatin (PRIMAXIN) 500 mg in sodium chloride 0.9 % 100 mL IVPB        500 mg 200 mL/hr over 30 Minutes Intravenous 4 times per day 12/14/10 1043     12/14/10 1100   vancomycin (VANCOCIN) IVPB 1000 mg/200 mL  premix        1,000 mg 200 mL/hr over 60 Minutes Intravenous Every 12 hours 12/14/10 1043            Assessment:  D#8/x Primaxin 500mg  q6h/Vanc 1g q12h for knee infection/gangrenous adjacent skin, now s/p right AKA.  11/7 VT 17.8 (VT goal 15-20), Estimated Creatinine Clearance stable:  >164ml/min (normalized). Wound cx few CNS, Ucx ecoli (resistant to amp only), Blood cxs: ngtd.  Afeb, WBC improving.  Goal of Therapy:  Vancomycin trough level 15-20 mcg/ml  Plan:  If antibiotics are expected to continue >24 more hours, will recheck trough to r/o accumulation.  Loralee Pacas R 12/21/2010,8:47 AM

## 2010-12-21 NOTE — Progress Notes (Signed)
Subjective: Did well overnight. Not in pain, has no new issues and BP has remained stable. Has a cough, productive of whitish phlegm.  Objective: Vital signs in last 24 hours: Temp:  [97.6 F (36.4 C)-99.7 F (37.6 C)] 97.8 F (36.6 C) (11/12 0616) Pulse Rate:  [78-90] 78  (11/12 0616) Resp:  [17-22] 18  (11/12 0616) BP: (94-104)/(48-79) 94/60 mmHg (11/12 0616) SpO2:  [90 %-97 %] 94 % (11/12 0616) Weight change:  Last BM Date: 12/18/10  Intake/Output from previous day: 11/11 0701 - 11/12 0700 In: 1632 [P.O.:680; I.V.:150; IV Piggyback:802] Out: 3785 [Urine:3785]     Physical Exam: General: Comfortable, alert, communicative, continues to be garrulous fully oriented, not short of breath at rest.  HEENT: Mild clinical pallor, no jaundice, no conjunctival injection or discharge.  NECK: Supple, JVP not seen, no carotid bruits, no palpable lymphadenopathy, no palpable goiter.  CHEST: Clinically clear to auscultation, no wheezes, no crackles.  HEART: Sounds 1 and 2 heard, normal, irregular, no murmurs.  ABDOMEN: Soft, non-tender, no palpable organomegaly, no palpable masses, normal bowel sounds.  LOWER EXTREMITIES: RLE stump under ACE bandage.  MUSCULOSKELETAL SYSTEM: Generalized osteoarthritic changes, otherwise, normal.  CENTRAL NERVOUS SYSTEM: No focal neurologic deficit on gross examination.   Lab Results:  [facility] 12/21/10 0457 12/20/10 0331  WBC 6.2 7.5  HGB 10.5* 10.8*  HCT 30.8* 30.8*  PLT 344 321    Basename 12/21/10 0457 12/20/10 0331  NA 131* 130*  K 4.2 4.2  CL 98 97  CO2 29 28  GLUCOSE 85 88  BUN 5* 4*  CREATININE 0.57 0.44*  CALCIUM 8.4 8.2*   Recent Results (from the past 240 hour(s))  CULTURE, BLOOD (ROUTINE X 2)     Status: Normal   Collection Time   12/14/10  3:15 AM      Component Value Range Status Comment   Specimen Description BLOOD LEFT ARM   Final    Special Requests BOTTLES DRAWN AEROBIC AND ANAEROBIC 5CC   Final    Setup Time [id number]    Final    Culture NO GROWTH 5 DAYS   Final    Report Status 12/20/2010 FINAL   Final   URINE CULTURE     Status: Normal   Collection Time   12/14/10  3:32 AM      Component Value Range Status Comment   Specimen Description URINE, CATHETERIZED   Final    Special Requests NONE   Final    Setup Time [id number]   Final    Colony Count >=100,000 COLONIES/ML   Final    Culture ESCHERICHIA COLI   Final    Report Status 12/16/2010 FINAL   Final    Organism ID, Bacteria ESCHERICHIA COLI   Final   WOUND CULTURE     Status: Normal   Collection Time   12/16/10  4:08 PM      Component Value Range Status Comment   Specimen Description LEG   Final    Special Requests Normal   Final    Gram Stain     Final    Value: MODERATE WBC PRESENT, PREDOMINANTLY PMN     NO SQUAMOUS EPITHELIAL CELLS SEEN     NO ORGANISMS SEEN   Culture FEW STAPHYLOCOCCUS SPECIES (COAGULASE NEGATIVE)   Final    Report Status 12/18/2010 FINAL   Final      Studies/Results: No results found.  Medications: Scheduled Meds:    . amiodarone  200 mg Oral Daily  . digoxin  0.125 mg Oral Daily  . docusate sodium  100 mg Oral BID  . enoxaparin  40 mg Subcutaneous Daily  . ferrous sulfate  325 mg Oral TID PC  . imipenem-cilastatin  500 mg Intravenous Q6H  . vancomycin  1,000 mg Intravenous Q12H  . DISCONTD: polyethylene glycol  17 g Oral Daily  . DISCONTD: senna  1 tablet Oral BID AC   Continuous Infusions:    . DISCONTD: 0.9 % NaCl with KCl 20 mEq / L 100 mL/hr at 12/20/10 0221   PRN Meds:.acetaminophen, acetaminophen, alum & mag hydroxide-simeth, cyclobenzaprine, HYDROcodone-acetaminophen, HYDROmorphone, metoCLOPramide (REGLAN) injection, metoCLOPramide, ondansetron (ZOFRAN) IV, ondansetron, polyethylene glycol, senna-docusate, sodium phosphate, zolpidem, DISCONTD: bisacodyl, DISCONTD: bisacodyl, DISCONTD: menthol-cetylpyridinium, DISCONTD: phenol, DISCONTD: sodium phosphate  Assessment/Plan: Principal Problem:    *Atrial fibrillation with RVR: Rate-controlled on Amiodarone/Digoxin. Cardiology input much appreciated. Patient will F/U with Dr Chales Abrahams, primary cardiologist at [facility], 2-4 weeks after discharge. Per Dr Charlann Boxer, Pradaxa can be restarted. Will likely restart today, provided Dr Kristin Bruins has no objection, as he is planning a dental procedure.  Active Problems:  1. Hypotension: Resolved. 2. Cellulitis right leg/Superficial skin necrosis and septic knee: Status post Rt AKA, now POD# 4. Have discussed ABx with Dr Charlann Boxer. Patent is to continue Vancomycin, day# 8. Primaxin will be DC-ed. 4. E.coli UTI: Fully treated. Will continue antibiotics. 5. Anemia: Acute blood loss on chronic. HB remains stable/reasonable, s/p transfusion of 3 units PRBC on 12/17/10.   Note: Background problems remain stable. Patient is due for dental prothesis by Dr Kristin Bruins. Ultimate aim is for CIR, for short-term rehab, then SNF, given home situation..        LOS: 7 days   Luiscarlos Kaczmarczyk,[name] 12/21/2010, 9:22 AM

## 2010-12-22 LAB — CBC
Hemoglobin: 10 g/dL — ABNORMAL LOW (ref 12.0–15.0)
MCHC: 33.6 g/dL (ref 30.0–36.0)
RDW: 16.4 % — ABNORMAL HIGH (ref 11.5–15.5)

## 2010-12-22 LAB — BASIC METABOLIC PANEL
GFR calc Af Amer: >90 mL/min (ref >90)
GFR calc non Af Amer: >90 mL/min (ref >90)
Glucose, Bld: 96 mg/dL (ref 70–99)
Potassium: 4.2 mEq/L (ref 3.5–5.1)
Sodium: 132 mEq/L — ABNORMAL LOW (ref 135–145)

## 2010-12-22 MED ORDER — MIDODRINE HCL 5 MG PO TABS
5.0000 mg | ORAL_TABLET | Freq: Three times a day (TID) | ORAL | Status: DC
  Administered 2010-12-22 – 2010-12-28 (×18): 5 mg via ORAL
  Filled 2010-12-22 (×19): qty 1

## 2010-12-22 NOTE — Progress Notes (Signed)
Subjective: Did well overnight. Not in pain, has no new issues and BP has remained stable relatively stable. C/O marked LLE edema overnight.  Objective: Vital signs in last 24 hours: Temp:  [97.3 F (36.3 C)-98 F (36.7 C)] 97.3 F (36.3 C) (11/13 1332) Pulse Rate:  [73-91] 84  (11/13 1332) Resp:  [16-18] 16  (11/13 1332) BP: (91-98)/(55-64) 91/55 mmHg (11/13 1332) SpO2:  [93 %-95 %] 95 % (11/13 1332) Weight change:  Last BM Date: 12/18/10  Intake/Output from previous day: 11/12 0701 - 11/13 0700 In: 1340 [P.O.:840; IV Piggyback:500] Out: 2075 [Urine:2075] Total I/O In: 640 [P.O.:640] Out: 825 [Urine:825]   Physical Exam: General: Comfortable, alert, communicative, continues to be garrulous fully oriented, not short of breath at rest.  HEENT: Mild clinical pallor, no jaundice, no conjunctival injection or discharge.  NECK: Supple, JVP not seen, no carotid bruits, no palpable lymphadenopathy, no palpable goiter.  CHEST: Clinically clear to auscultation, no wheezes, no crackles.  HEART: Sounds 1 and 2 heard, normal, irregular, no murmurs.  ABDOMEN: Soft, non-tender, no palpable organomegaly, no palpable masses, normal bowel sounds.  LOWER EXTREMITIES: RLE stump under ACE bandage., has wound vac. LLE is markedly edematous, without evidence of inflammation. MUSCULOSKELETAL SYSTEM: Generalized osteoarthritic changes, otherwise, normal.  CENTRAL NERVOUS SYSTEM: No focal neurologic deficit on gross examination.   Lab Results:  [facility] 12/22/10 0440 12/21/10 0457  WBC 5.5 6.2  HGB 10.0* 10.5*  HCT 29.8* 30.8*  PLT 376 344    Basename 12/22/10 0440 12/21/10 0457  NA 132* 131*  K 4.2 4.2  CL 96 98  CO2 31 29  GLUCOSE 96 85  BUN 5* 5*  CREATININE 0.59 0.57  CALCIUM 8.4 8.4   Recent Results (from the past 240 hour(s))  CULTURE, BLOOD (ROUTINE X 2)     Status: Normal   Collection Time   12/14/10  3:15 AM      Component Value Range Status Comment   Specimen Description  BLOOD LEFT ARM   Final    Special Requests BOTTLES DRAWN AEROBIC AND ANAEROBIC 5CC   Final    Setup Time [id number]   Final    Culture NO GROWTH 5 DAYS   Final    Report Status 12/20/2010 FINAL   Final   URINE CULTURE     Status: Normal   Collection Time   12/14/10  3:32 AM      Component Value Range Status Comment   Specimen Description URINE, CATHETERIZED   Final    Special Requests NONE   Final    Setup Time [id number]   Final    Colony Count >=100,000 COLONIES/ML   Final    Culture ESCHERICHIA COLI   Final    Report Status 12/16/2010 FINAL   Final    Organism ID, Bacteria ESCHERICHIA COLI   Final   WOUND CULTURE     Status: Normal   Collection Time   12/16/10  4:08 PM      Component Value Range Status Comment   Specimen Description LEG   Final    Special Requests Normal   Final    Gram Stain     Final    Value: MODERATE WBC PRESENT, PREDOMINANTLY PMN     NO SQUAMOUS EPITHELIAL CELLS SEEN     NO ORGANISMS SEEN   Culture FEW STAPHYLOCOCCUS SPECIES (COAGULASE NEGATIVE)   Final    Report Status 12/18/2010 FINAL   Final      Studies/Results: No results found.  Medications:  Scheduled Meds:    . amiodarone  200 mg Oral Daily  . chlorhexidine  15 mL Mouth/Throat BID  . dabigatran  150 mg Oral BID  . digoxin  0.125 mg Oral Daily  . docusate sodium  100 mg Oral BID  . ferrous sulfate  325 mg Oral TID PC  . vancomycin  1,000 mg Intravenous Q12H  . DISCONTD: enoxaparin  40 mg Subcutaneous Daily   Continuous Infusions:   PRN Meds:.acetaminophen, acetaminophen, alum & mag hydroxide-simeth, cyclobenzaprine, diphenhydrAMINE, HYDROcodone-acetaminophen, HYDROmorphone, metoCLOPramide (REGLAN) injection, metoCLOPramide, ondansetron (ZOFRAN) IV, ondansetron, polyethylene glycol, promethazine, senna-docusate, sodium phosphate, zolpidem  Assessment/Plan: Principal Problem:  *Atrial fibrillation with RVR: Rate-controlled on Amiodarone/Digoxin. Cardiology input by Dr Jennette Bill al,  much appreciated. Patient will F/U with Dr Chales Abrahams, primary cardiologist at [facility], 2-4 weeks after discharge. Pradaxa was  Restarted on 12/12/10, after ok obtained from Dr Charlann Boxer.  Active Problems:  1. Hypotension: Resolved. BP still borderline. Will start Midodrine 2. 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 Have discussed ABx with Dr Charlann Boxer. Patent is to continue Vancomycin, now day# 9. Primaxin was  DC-ed on 12/21/10.. 4. E.coli UTI: Fully treated.  5. Anemia: Acute blood loss on chronic. HB remains stable/reasonable, s/p transfusion of 3 units PRBC on 12/17/10.  6. Dental prothesis done by Dr Kristin Bruins on ii/12/12. 7. LLE edema: This is rather dramatic, and became obvious overnight. Will evaluate with venous doppler, to r/o DVT.  Disp: Ultimate aim is for SNF.        LOS: 8 days   [name],[name] 12/22/2010, 4:01 PM

## 2010-12-22 NOTE — Progress Notes (Signed)
CSW spoke with the pt and her sister (psychosocial assessment completed and in shadow chart) Her sister Darl Pikes is HCPOA and her only living family member. Prior to coming to the hospital the pt lived at home alone. The pt is 100% agreeable to SNF. Pt has been to [facility] in the past and she would like to return there is possible. Pt declined the list of skilled nursing facilities and requested to only be faxed out to [facility] first. CSW notified the pt and her sister that if [facility] did not make a bed offer she would need to be faxed out to other facilities. CSW will continue to follow and offer support.  Pts FL-2 has been completed and will be placed on the chart. CSW has faxed clinicals to [facility] and awaiting to see if a bed offer is made.  Patrice Paradise, LCSWA 12/22/2010 1:02 PM [phone]

## 2010-12-22 NOTE — Progress Notes (Signed)
Subjective: 5 Days Post-Op Procedure(s): AMPUTATION ABOVE KNEE RIGHT LEG APPLICATION OF WOUND VAC  Patient reports pain as mild.  Objective:   VITALS:   Filed Vitals:   12/22/10 0545  BP: 91/61  Pulse: 86  Temp: 98 F (36.7 C)  Resp: 16    Neurovascular intact Incision: dressing C/D/I No cellulitis present Compartment soft  LABS  Basename 12/22/10 0440 12/21/10 0457 12/20/10 0331  HGB 10.0* 10.5* 10.8*  HCT 29.8* 30.8* 30.8*  WBC 5.5 6.2 7.5  PLT 376 344 321     Basename 12/22/10 0440 12/21/10 0457 12/20/10 0331  NA 132* 131* 130*  K 4.2 4.2 4.2  BUN 5* 5* 4*  CREATININE 0.59 0.57 0.44*  GLUCOSE 96 85 88   Assessment/Plan: 5 Days Post-Op Procedure(s): AMPUTATION ABOVE KNEE RIGHT LEG APPLICATION OF WOUND VAC   Up with therapy Discharge to SNF when ready Social Worker consult ordered to discuss options   Anastasio Auerbach. Lumina Gitto   PAC  12/22/2010, 9:04 AM

## 2010-12-22 NOTE — Patient Instructions (Signed)
Pt. Instructed in UE exercise program with focus on scapular strengthening and good alignment of shoulders

## 2010-12-22 NOTE — Progress Notes (Signed)
Physical Therapy Treatment Patient Details Name: [name] MRN: [id number] DOB: [birth year] Today's Date: 12/22/2010 4132-4401 2TA 1WC  PT Assessment/Plan  PT - Assessment/Plan Comments on Treatment Session: Patient with much improved activity tolerance and able to transfer out of bed with assist and perform supine to sit with supervision. Still feel she could reach modified independent goals if allowed to go to inpatient rehab, but patient actively looking for SNF. PT Plan: Discharge plan remains appropriate PT Frequency: Min 4X/week Follow Up Recommendations: Inpatient Rehab;Skilled nursing facility Equipment Recommended: Defer to next venue PT Goals  Acute Rehab PT Goals Pt will go Supine/Side to Sit: with modified independence;with HOB 0 degrees PT Goal: Supine/Side to Sit - Progress: Revised (modified due to lack of progress/goal met) PT Transfer Goal: Bed to Chair/Chair to Bed - Progress: Progressing toward goal PT Goal: Stand - Progress: Progressing toward goal PT Goal: Propel Wheelchair - Progress: Met  PT Treatment Precautions/Restrictions  Precautions Precautions: Fall Restrictions Weight Bearing Restrictions: Yes RLE Weight Bearing: Non weight bearing LLE Weight Bearing: Weight bearing as tolerated Mobility (including Balance) Bed Mobility Supine to Sit: 5: Supervision;HOB elevated (Comment degrees) (HOB  30 degrees) Supine to Sit Details (indicate cue type and reason): cues for technique Transfers Stand Pivot Transfers: From elevated surface Stand Pivot Transfer Details (indicate cue type and reason): with rolling walker and cues for technique.  c/o lightheaded.  BP WNL after return to sit Naval architect Assistance: 5: Financial planner Details (indicate cue type and reason): assist for IV and VAC lines Wheelchair Propulsion: Both upper extremities Wheelchair Parts Management: Needs assistance Distance: 300'    Posture/Postural Control Posture/Postural Control: No significant limitations Static Sitting Balance Static Sitting - Balance Support: No upper extremity supported;Feet supported (left foot support) Static Sitting - Level of Assistance: 7: Independent Static Sitting - Comment/# of Minutes: 10 minutes waiting for medication delivery Exercise    End of Session PT - End of Session Equipment Utilized During Treatment: Gait belt Activity Tolerance: Patient tolerated treatment well Patient left: in chair Nurse Communication: Mobility status for transfers General Behavior During Session: [facility] for tasks performed Cognition: [facility] for tasks performed  [facility] 12/22/2010, 10:29 AM

## 2010-12-22 NOTE — Progress Notes (Signed)
Occupational Therapy Evaluation Patient Details Name: [name] MRN: [id number] DOB: Sep 01, 1945 Today's Date: 12/22/2010  OT Assessment/Plan OT Assessment/Plan Comments on Treatment Session: .  Performed 12 reps of above stated exercises with 5-8 second hold, and cues for breathing and proper alignment.  Pt instructed to perform 10 reps 3-4 x/day.    Pt. tearful x 1 during session.  Emotional support provided. OT Plan: Discharge plan needs to be updated OT Frequency: Min 2X/week Follow Up Recommendations: Skilled nursing facility Equipment Recommended: Defer to next venue OT Goals Acute Rehab OT Goals OT Goal Formulation: With patient Time For Goal Achievement: 2 weeks ADL Goals ADL Goal: Toilet Transfer - Progress: Progressing toward goals Arm Goals Additional Arm Goal #2: Pt. will be independent with Bil. UE strengthening exercises with focus on strengthening scapular muscles and good alignment of shoulders.  OT Treatment Precautions/Restrictions  Precautions Precautions: Fall Restrictions Weight Bearing Restrictions: Yes RLE Weight Bearing: Partial weight bearing LLE Weight Bearing: Weight bearing as tolerated   ADL ADL Toilet Transfer: Simulated;+1 Total assistance (Pt. unable to move sit to stand from w/c) Toilet Transfer Details (indicate cue type and reason): Practiced partial sit to stand with max A to partially clear buttocks from chair x 5 ADL Comments: Pt sitting up in w/c.  Attempted partial sit to stand, but required max A to partially clear bottom.  Pt. reports history of Lt. shoulder pain.  Focused on teaching UE exercise with focus on scapular strengthening, and long discussion of importance of good alignment of shoulders to prevent injuries. Mobility  Bed Mobility Supine to Sit: 5: Supervision;HOB elevated (Comment degrees) (HOB  30 degrees) Supine to Sit Details (indicate cue type and reason): cues for technique Exercises  Pt. Instructed in "w/c"  pushups with focus on scapular adduction and depression with good alignment of shoulders to prevent injury.  Performed 12 reps with 5-8 second holds.  End of Session OT - End of Session Equipment Utilized During Treatment:  (w/c; RW) Activity Tolerance: Patient tolerated treatment well Patient left: in chair (wheelchair) General Behavior During Session: [facility] for tasks performed Cognition: [facility] for tasks performed  [name]  12/22/2010, 11:40 AM

## 2010-12-23 DIAGNOSIS — M79609 Pain in unspecified limb: Secondary | ICD-10-CM

## 2010-12-23 LAB — BASIC METABOLIC PANEL
Calcium: 8.4 mg/dL (ref 8.4–10.5)
GFR calc non Af Amer: >90 mL/min (ref >90)
Sodium: 131 mEq/L — ABNORMAL LOW (ref 135–145)

## 2010-12-23 LAB — CBC
MCH: 34.6 pg — ABNORMAL HIGH (ref 26.0–34.0)
MCHC: 33.6 g/dL (ref 30.0–36.0)
Platelets: 399 10*3/uL (ref 150–400)
RDW: 16.3 % — ABNORMAL HIGH (ref 11.5–15.5)

## 2010-12-23 MED ORDER — VANCOMYCIN HCL 1000 MG IV SOLR
750.0000 mg | Freq: Two times a day (BID) | INTRAVENOUS | Status: DC
  Administered 2010-12-23: 750 mg via INTRAVENOUS
  Filled 2010-12-23 (×2): qty 750

## 2010-12-23 MED ORDER — CYCLOBENZAPRINE HCL 10 MG PO TABS
10.0000 mg | ORAL_TABLET | ORAL | Status: DC | PRN
  Administered 2010-12-23 – 2010-12-28 (×12): 10 mg via ORAL
  Filled 2010-12-23 (×14): qty 1

## 2010-12-23 MED ORDER — JUVEN PO PACK
1.0000 | PACK | Freq: Two times a day (BID) | ORAL | Status: DC
  Administered 2010-12-23 – 2010-12-28 (×10): 1 via ORAL
  Filled 2010-12-23 (×12): qty 1

## 2010-12-23 NOTE — Consult Note (Signed)
WOC Re-consult Note Vac dressing changed to right stump post-op full thickness wound.  Refer to previous wound care note for assessment and measurements.  No change in appearance at this time. Applied one piece black foam to 125cm cont suction.  Pt tolerated with mod discomfort.  Mod pink drainage in cannister, no odor.  Bedside nurse to change dressing Friday. Will not plan to follow further unless re-consulted.  [address] RN, MSN, [employer]  [phone]

## 2010-12-23 NOTE — Progress Notes (Signed)
ANTIBIOTIC CONSULT NOTE - FOLLOW UP  Pharmacy Consult for Vancomycin Indication: TKA Infection  Allergies  Allergen Reactions  . Sulfa Antibiotics Shortness Of Breath  . Avelox (Moxifloxacin Hcl In Nacl)   . Penicillins   . Robaxin Nausea And Vomiting    Patient Measurements: Height: 5\' 11"  (180.3 cm) Weight: 199 lb 4.7 oz (90.4 kg) IBW/kg (Calculated) : 70.8   Vital Signs: Temp: 97.7 F (36.5 C) (11/13 2150) Temp src: Oral (11/13 2150) BP: 99/63 mmHg (11/13 2150) Pulse Rate: 84  (11/13 2150) Intake/Output from previous day: 11/13 0701 - 11/14 0700 In: 1320 [P.O.:1120; IV Piggyback:200] Out: 1650 [Urine:1650] Intake/Output from this shift: Total I/O In: -  Out: 425 [Urine:425]  Labs:  [facility] 12/22/10 0440 12/21/10 0457 12/20/10 0331  WBC 5.5 6.2 7.5  HGB 10.0* 10.5* 10.8*  PLT 376 344 321  LABCREA -- -- --  CREATININE 0.59 0.57 0.44*   Estimated Creatinine Clearance: 87 ml/min (by C-G formula based on Cr of 0.59).  Basename 12/22/10 2244  VANCOTROUGH 24.6*  VANCOPEAK --  Drue Dun --  GENTTROUGH --  GENTPEAK --  GENTRANDOM --  TOBRATROUGH --  TOBRAPEAK --  TOBRARND --  AMIKACINPEAK --  AMIKACINTROU --  AMIKACIN --     Microbiology: Recent Results (from the past 720 hour(s))  SURGICAL PCR SCREEN     Status: Normal   Collection Time   12/08/10 11:45 AM      Component Value Range Status Comment   MRSA, PCR NEGATIVE  NEGATIVE  Final    Staphylococcus aureus NEGATIVE  NEGATIVE  Final   CULTURE, BLOOD (ROUTINE X 2)     Status: Normal   Collection Time   12/14/10  3:15 AM      Component Value Range Status Comment   Specimen Description BLOOD LEFT ARM   Final    Special Requests BOTTLES DRAWN AEROBIC AND ANAEROBIC 5CC   Final    Setup Time [id number]   Final    Culture NO GROWTH 5 DAYS   Final    Report Status 12/20/2010 FINAL   Final   URINE CULTURE     Status: Normal   Collection Time   12/14/10  3:32 AM      Component Value Range Status  Comment   Specimen Description URINE, CATHETERIZED   Final    Special Requests NONE   Final    Setup Time [id number]   Final    Colony Count >=100,000 COLONIES/ML   Final    Culture ESCHERICHIA COLI   Final    Report Status 12/16/2010 FINAL   Final    Organism ID, Bacteria ESCHERICHIA COLI   Final   WOUND CULTURE     Status: Normal   Collection Time   12/16/10  4:08 PM      Component Value Range Status Comment   Specimen Description LEG   Final    Special Requests Normal   Final    Gram Stain     Final    Value: MODERATE WBC PRESENT, PREDOMINANTLY PMN     NO SQUAMOUS EPITHELIAL CELLS SEEN     NO ORGANISMS SEEN   Culture FEW STAPHYLOCOCCUS SPECIES (COAGULASE NEGATIVE)   Final    Report Status 12/18/2010 FINAL   Final     Anti-infectives     Start     Dose/Rate Route Frequency Ordered Stop   12/23/10 1400   vancomycin (VANCOCIN) 750 mg in sodium chloride 0.9 % 150 mL IVPB  750 mg 150 mL/hr over 60 Minutes Intravenous Every 12 hours 12/23/10 0134     12/21/10 1230   vancomycin (VANCOCIN) IVPB 1000 mg/200 mL premix  Status:  Discontinued        1,000 mg 200 mL/hr over 60 Minutes Intravenous Every 12 hours 12/21/10 1227 12/21/10 1448   12/14/10 1200   imipenem-cilastatin (PRIMAXIN) 500 mg in sodium chloride 0.9 % 100 mL IVPB  Status:  Discontinued        500 mg 200 mL/hr over 30 Minutes Intravenous 4 times per day 12/14/10 1043 12/21/10 0939   12/14/10 1100   vancomycin (VANCOCIN) IVPB 1000 mg/200 mL premix  Status:  Discontinued        1,000 mg 200 mL/hr over 60 Minutes Intravenous Every 12 hours 12/14/10 1043 12/23/10 0132          Assessment: Trough = 24.6- Appears to have been drawn correctly. Above goal.  Goal of Therapy:  Vancomycin trough level 15-20 mcg/ml  Plan:  Decrease Vancomycin to 750mg  IV q12h next @ 1400 today. F/U SCr/further levels as needed.  Susanne Greenhouse R 12/23/2010,1:35 AM

## 2010-12-23 NOTE — Progress Notes (Signed)
[facility] Inpatient Progress Note  12/23/2010  Subjective: Pt has less edema in LLE.  Pt says that she is concerned about potential nursing homes for rehab.  She was not accepted at facility of choice because of wound vac.  Pt says she may have to go home with assistance if cannot get a good rehab facility.  No palpitations noted.   Objective:  Vital signs in last 24 hours: Filed Vitals:   12/22/10 1332 12/22/10 2150 12/23/10 0714 12/23/10 1353  BP: 91/55 99/63 98/65  93/59  Pulse: 84 84 80 79  Temp: 97.3 F (36.3 C) 97.7 F (36.5 C) 98.2 F (36.8 C) 97.4 F (36.3 C)  TempSrc: Oral Oral Oral Oral  Resp: 16 16 20 18   Height:      Weight:      SpO2: 95% 93% 94% 94%   Weight change:   Intake/Output Summary (Last 24 hours) at 12/23/10 1657 Last data filed at 12/23/10 1500  Gross per 24 hour  Intake    480 ml  Output   2702 ml  Net  -2222 ml    Review of Systems Pertinent items are noted in HPI. otherwise negative  Physical Exam General: Comfortable, alert, communicative, fully oriented, not short of breath at rest.  HEENT: Mild clinical pallor, no jaundice, no conjunctival injection or discharge.  NECK: Supple, JVP not seen, no carotid bruits, no palpable lymphadenopathy, no palpable goiter.  CHEST: Clinically clear to auscultation, no wheezes, no crackles.  HEART: Sounds 1 and 2 heard, normal, irregular, no murmurs.  ABDOMEN: Soft, non-tender, no palpable organomegaly, no palpable masses, normal bowel sounds.  LOWER EXTREMITIES: RLE stump under ACE bandage., has wound vac. LLE is markedly edematous, without evidence of inflammation.  MUSCULOSKELETAL SYSTEM: Generalized osteoarthritic changes, otherwise, normal.  CENTRAL NERVOUS SYSTEM: No focal neurologic deficit on gross examination.    Lab Results:  Micro Results: Recent Results (from the past 240 hour(s))  CULTURE, BLOOD (ROUTINE X 2)     Status: Normal   Collection Time   12/14/10  3:15 AM   Component Value Range Status Comment   Specimen Description BLOOD LEFT ARM   Final    Special Requests BOTTLES DRAWN AEROBIC AND ANAEROBIC 5CC   Final    Setup Time [id number]   Final    Culture NO GROWTH 5 DAYS   Final    Report Status 12/20/2010 FINAL   Final   URINE CULTURE     Status: Normal   Collection Time   12/14/10  3:32 AM      Component Value Range Status Comment   Specimen Description URINE, CATHETERIZED   Final    Special Requests NONE   Final    Setup Time [id number]   Final    Colony Count >=100,000 COLONIES/ML   Final    Culture ESCHERICHIA COLI   Final    Report Status 12/16/2010 FINAL   Final    Organism ID, Bacteria ESCHERICHIA COLI   Final   WOUND CULTURE     Status: Normal   Collection Time   12/16/10  4:08 PM      Component Value Range Status Comment   Specimen Description LEG   Final    Special Requests Normal   Final    Gram Stain     Final    Value: MODERATE WBC PRESENT, PREDOMINANTLY PMN     NO SQUAMOUS EPITHELIAL CELLS SEEN     NO ORGANISMS SEEN   Culture FEW STAPHYLOCOCCUS SPECIES (COAGULASE  NEGATIVE)   Final    Report Status 12/18/2010 FINAL   Final     Studies/Results: No results found.  Medications:  Scheduled Meds:   . amiodarone  200 mg Oral Daily  . chlorhexidine  15 mL Mouth/Throat BID  . dabigatran  150 mg Oral BID  . digoxin  0.125 mg Oral Daily  . docusate sodium  100 mg Oral BID  . ferrous sulfate  325 mg Oral TID PC  . Juven  1 packet Oral BID  . midodrine  5 mg Oral TID WC  . vancomycin  750 mg Intravenous Q12H  . DISCONTD: vancomycin  1,000 mg Intravenous Q12H   Continuous Infusions:  PRN Meds:.acetaminophen, acetaminophen, alum & mag hydroxide-simeth, cyclobenzaprine, diphenhydrAMINE, HYDROcodone-acetaminophen, HYDROmorphone, metoCLOPramide (REGLAN) injection, metoCLOPramide, ondansetron (ZOFRAN) IV, ondansetron, polyethylene glycol, promethazine, senna-docusate, sodium phosphate, zolpidem  Assessment/Plan:  Atrial  fibrillation with RVR (12/14/2010)  currently rate controlled, continue current medications.  appreciate cardiology input.  Pt to f/u with Dr. Chales Abrahams in [place] at [place]  Hypotension, unspecified (12/14/2010)   Resolved  Cellulitis and abscess of leg (12/14/2010)   Completed Primaxin, DC Vancomycin day 10/10  E.coli UTI - treated fully  Anemia - stable Hg, following  Dental Prosthesis in place and doing very well  LLE edema - clinically improved; awaiting results of doppler US studies.    LOS: 9 days   Clanford Johnson 12/23/2010, 4:57 PM   Cleora Fleet, MD, CDE, FAAFP [facility] [place], [place]

## 2010-12-23 NOTE — Progress Notes (Signed)
INITIAL ADULT NUTRITION ASSESSMENT Date: 12/23/2010   Time: 2:33 PM Reason for Assessment: poor PO  ASSESSMENT: Female 65 y.o.  Dx: Atrial fibrillation with RVR  Hx:  Past Medical History  Diagnosis Date  . Emphysema   . Coronary artery disease   . Atrial fibrillation   . COPD (chronic obstructive pulmonary disease)   . History of atrial fibrillation     S/pP Ablation  . Asthma   . Chronic kidney disease     HX OF UTI  . Arthritis   . Osteoarthritis   . Rheumatoid arthritis   . Anemia   . B12 deficiency anemia   . Stroke     slight stroke in 2009-no deficits  . Neuromuscular disorder     hx of right quadricep rupture  . DJD (degenerative joint disease)   . History of benign bladder tumor     Related Meds:  Scheduled Meds:   . amiodarone  200 mg Oral Daily  . chlorhexidine  15 mL Mouth/Throat BID  . dabigatran  150 mg Oral BID  . digoxin  0.125 mg Oral Daily  . docusate sodium  100 mg Oral BID  . ferrous sulfate  325 mg Oral TID PC  . midodrine  5 mg Oral TID WC  . vancomycin  750 mg Intravenous Q12H  . DISCONTD: vancomycin  1,000 mg Intravenous Q12H   Continuous Infusions:  PRN Meds:.acetaminophen, acetaminophen, alum & mag hydroxide-simeth, cyclobenzaprine, diphenhydrAMINE, HYDROcodone-acetaminophen, HYDROmorphone, metoCLOPramide (REGLAN) injection, metoCLOPramide, ondansetron (ZOFRAN) IV, ondansetron, polyethylene glycol, promethazine, senna-docusate, sodium phosphate, zolpidem   Ht: 5\' 11"  (180.3 cm)  Wt: 199 lb 4.7 oz (90.4 kg)  Ideal Wt: 76.5 kg % Ideal Wt:   Usual Wt: recent amputation % Usual Wt:   Body mass index is 27.80 kg/(m^2).  Inaccurate, pt is an amputee  Food/Nutrition Related Hx:  Pt screened and evaluated by RD. Pt with several recent revisions to TKA with persistent infection.  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  Pt has had several stays in rehab facilities over the years, however was previously functional, independent at home PTA; used  a wheel chair.  Post-op, pt has had difficulty meeting her nutritional needs.  Per pt, her usual eating habits are poor and through the events of the past year (several infections, hospital stays, teeth removals) she has lost approximately 50 lbs and gotten 'out of practice' as far as her eating goes.  She reports, 'I eat like a bird.' Review of wt hx available shows pt UBW 77-85kg; some wt loss PTA, however not significant. Discussed pt nutritional needs and ways to meet them. Spent time reviewing menu with pt to identify items with nutritional benefit.  Pt is motivated to improve intake.  Set goals with pt. Discussed ways to encourage appetite, including activity and participation with PT/OT as much as able.  Pt agreeable.  Labs:  CMP     Component Value Date/Time   NA 131* 12/23/2010 0508   K 4.0 12/23/2010 0508   CL 95* 12/23/2010 0508   CO2 31 12/23/2010 0508   GLUCOSE 94 12/23/2010 0508   BUN 4* 12/23/2010 0508   CREATININE 0.58 12/23/2010 0508   CALCIUM 8.4 12/23/2010 0508   PROT 4.5* 12/19/2010 0422   ALBUMIN 1.7* 12/19/2010 0422   AST 35 12/19/2010 0422   ALT 15 12/19/2010 0422   ALKPHOS 79 12/19/2010 0422   BILITOT 0.3 12/19/2010 0422   GFRNONAA >90 12/23/2010 0508   GFRAA >90 12/23/2010 4098  Intake:  Output:   Intake/Output Summary (Last 24 hours) at 12/23/10 1445 Last data filed at 12/23/10 1300  Gross per 24 hour  Intake    480 ml  Output   1752 ml  Net  -1272 ml   Pt with 1 BM daily  Diet Order: Regular  Supplements/Tube Feeding: none at this time  IVF:   Estimated Nutritional Needs:   Kcal:2275-2600 kcal Protein:105-121 g Fluid:>2.4 L/day  NUTRITION DIAGNOSIS: -Inadequate oral intake (NI-2.1).  Status: Ongoing  RELATED TO: anorexia, pt current eating habits  AS EVIDENCE BY: PO 25% of meal, unsupplemented  MONITORING/EVALUATION(Goals): 1.  Food/Beverage; pt to improve intake to >50% of meals.  Pt to try Juven.  Goal is to drink Juven BID. 2.   Skin; s/s healing at stump 3.  Wt/wt change; deter further loss.  Goal is wt maintenance.  EDUCATION NEEDS: -Education needs addressed  INTERVENTION: 1.  General diet; encourage intake.  Pt receptive to suggestions and motivated to improve.  Reviewed menu with pt to identify best options to maximize her intake and nutritional quality.   2.  Supplements; Juven BID.  May mix with 4-8oz juice or water as desired by pt. 3.  Meals/snacks; pt would like ice cream hs.  Placed sticky note in front of chart to notify staff pt may request.  Dietitian #: [phone]  DOCUMENTATION CODES Per approved criteria  -Non-severe (moderate) malnutrition in the context of chronic illness    Hoyt Koch 12/23/2010, 2:33 PM

## 2010-12-23 NOTE — Progress Notes (Signed)
*  PRELIMINARY RESULTS* Lower venous doppler performed. Results showed no obvious evidence of DVT, superficial thrombus or Bakers cyst.  Vinetta Bergamo 12/23/2010, 3:22 PM

## 2010-12-23 NOTE — Progress Notes (Signed)
Subjective: 6 Days Post-Op Procedure(s): AMPUTATION ABOVE KNEE (RIGHT)  APPLICATION OF WOUND VAC  Patient reports pain as mild.  Objective:   VITALS:   Filed Vitals:   12/23/10 0714  BP: 98/65  Pulse: 80  Temp: 98.2 F (36.8 C)  Resp: 20    Incision: dressing C/D/I No cellulitis present Compartment soft  LABS  Basename 12/23/10 0508 12/22/10 0440 12/21/10 0457  HGB 9.7* 10.0* 10.5*  HCT 28.9* 29.8* 30.8*  WBC 5.0 5.5 6.2  PLT 399 376 344     Basename 12/23/10 0508 12/22/10 0440 12/21/10 0457  NA 131* 132* 131*  K 4.0 4.2 4.2  BUN 4* 5* 5*  CREATININE 0.58 0.59 0.57  GLUCOSE 94 96 85    Assessment/Plan: 6 Days Post-Op Procedure(s): AMPUTATION ABOVE KNEE (RIGHT) APPLICATION OF WOUND VAC   Up with therapy Discharge to SNF when a bed found   [name]. [name]   PAC  12/23/2010, 7:43 AM

## 2010-12-24 DIAGNOSIS — G4700 Insomnia, unspecified: Secondary | ICD-10-CM | POA: Diagnosis not present

## 2010-12-24 DIAGNOSIS — D649 Anemia, unspecified: Secondary | ICD-10-CM | POA: Diagnosis present

## 2010-12-24 LAB — CBC
Hemoglobin: 9.4 g/dL — ABNORMAL LOW (ref 12.0–15.0)
MCHC: 33.6 g/dL (ref 30.0–36.0)
Platelets: 400 10*3/uL (ref 150–400)
RBC: 2.72 MIL/uL — ABNORMAL LOW (ref 3.87–5.11)

## 2010-12-24 LAB — BASIC METABOLIC PANEL
BUN: 9 mg/dL (ref 6–23)
Calcium: 8.4 mg/dL (ref 8.4–10.5)
GFR calc Af Amer: >90 mL/min (ref >90)
GFR calc non Af Amer: >90 mL/min (ref >90)
Potassium: 3.8 mEq/L (ref 3.5–5.1)
Sodium: 132 mEq/L — ABNORMAL LOW (ref 135–145)

## 2010-12-24 MED ORDER — TRAZODONE HCL 50 MG PO TABS
50.0000 mg | ORAL_TABLET | Freq: Every evening | ORAL | Status: DC | PRN
  Administered 2010-12-25: 50 mg via ORAL
  Filled 2010-12-24 (×2): qty 1

## 2010-12-24 NOTE — Progress Notes (Signed)
[facility] Inpatient Progress Note  Subjective: The patient is having trouble sleeping at night.  She was declined by 2 rehab facilities because of wound vac.  She otherwise has been ambulating some with assistance and was sitting up in a chair today when I saw her.    Objective: Filed Vitals:   12/23/10 0714 12/23/10 1353 12/23/10 2124 12/24/10 0520  BP: 98/65 93/59 94/61  88/54  Pulse: 80 79 66 73  Temp: 98.2 F (36.8 C) 97.4 F (36.3 C) 97.4 F (36.3 C) 98 F (36.7 C)  TempSrc: Oral Oral Oral Oral  Resp: 20 18 18 18   Height:      Weight:      SpO2: 94% 94% 94% 95%   Weight change:   Intake/Output Summary (Last 24 hours) at 12/24/10 1211 Last data filed at 12/24/10 0900  Gross per 24 hour  Intake    880 ml  Output   2652 ml  Net  -1772 ml    General: Alert, awake, oriented x3, in no acute distress.  HEENT: No bruits, no goiter.  Heart: irreg irreg  Lungs: BBS CTA Abdomen: Soft, nontender, nondistended, positive bowel sounds.  Neuro: Grossly intact, nonfocal. Ext: Right AKA stump wound vac in place Neuro: no gross deficits   REVIEW OF SYSTEMS: as in subjective above, otherwise all reviewed and reported as negative   Lab Results: Basename 12/24/10 0445 12/23/10 0508  NA 132* 131*  K 3.8 4.0  CL 96 95*  CO2 33* 31  GLUCOSE 91 94  BUN 9 4*  CREATININE 0.62 0.58  CALCIUM 8.4 8.4  MG -- --  PHOS -- --   No results found for this basename: AST:2,ALT:2,ALKPHOS:2,BILITOT:2,PROT:2,ALBUMIN:2 in the last 72 hours No results found for this basename: LIPASE:2,AMYLASE:2 in the last 72 hours  Basename 12/24/10 0445 12/23/10 0508  WBC 5.3 5.0  NEUTROABS -- --  HGB 9.4* 9.7*  HCT 28.0* 28.9*  MCV 102.9* 103.2*  PLT 400 399   No results found for this basename: CKTOTAL:3,CKMB:3,CKMBINDEX:3,TROPONINI:3 in the last 72 hours No results found for this basename: POCBNP:3 in the last 72 hours No results found for this basename: DDIMER:2 in the last 72 hours No  results found for this basename: HGBA1C:2 in the last 72 hours No results found for this basename: CHOL:2,HDL:2,LDLCALC:2,TRIG:2,CHOLHDL:2,LDLDIRECT:2 in the last 72 hours No results found for this basename: TSH,T4TOTAL,FREET3,T3FREE,THYROIDAB in the last 72 hours No results found for this basename: VITAMINB12:2,FOLATE:2,FERRITIN:2,TIBC:2,IRON:2,RETICCTPCT:2 in the last 72 hours  Micro Results: Recent Results (from the past 240 hour(s))  WOUND CULTURE     Status: Normal   Collection Time   12/16/10  4:08 PM      Component Value Range Status Comment   Specimen Description LEG   Final    Special Requests Normal   Final    Gram Stain     Final    Value: MODERATE WBC PRESENT, PREDOMINANTLY PMN     NO SQUAMOUS EPITHELIAL CELLS SEEN     NO ORGANISMS SEEN   Culture FEW STAPHYLOCOCCUS SPECIES (COAGULASE NEGATIVE)   Final    Report Status 12/18/2010 FINAL   Final     Studies/Results: No results found.  Medications: I have reviewed the patient's current medications.  Assessment/Plan:  Atrial fibrillation with RVR - currently rate controlled, continue current medications. appreciate cardiology input. Pt to f/u with Dr. Chales Abrahams in [place] at [place], monitoring on telemetry.   Hypotension, unspecified - chronic, pt totally asymptomatic  Cellulitis and abscess of leg -  Completed Primaxin, DC Vancomycin day 10/10. Continue wound VAC care.  Pt will need ongoing rehab s/p having the Right AKA.  Working with CSW to disposition properly.    E.coli UTI - treated fully   Anemia - stable Hg, following   Dental Prosthesis in place and doing very well.   LLE edema - clinically improved; awaiting results of doppler US studies.     LOS: 10 days   Clanford Johnson 12/24/2010, 12:11 PM  Cleora Fleet, MD, CDE, FAAFP [facility] [place], [place]

## 2010-12-24 NOTE — Progress Notes (Signed)
Subjective: 7 Days Post-Op Procedure(s): AMPUTATION ABOVE KNEE APPLICATION OF WOUND VAC  Patient reports pain as moderate. Controlled well with medications. Muscle relaxer is really helping well with the phantom pains.  Objective:   VITALS:   Filed Vitals:   12/24/10 0520  BP: 88/54  Pulse: 73  Temp: 98 F (36.7 C)  Resp: 18   Less edema in the lower extremities. Wound vac working appropriately. Incision: dressing C/D/I No cellulitis present Compartment soft  LABS  Basename 12/24/10 0445 12/23/10 0508 12/22/10 0440  HGB 9.4* 9.7* 10.0*  HCT 28.0* 28.9* 29.8*  WBC 5.3 5.0 5.5  PLT 400 399 376     Basename 12/24/10 0445 12/23/10 0508 12/22/10 0440  NA 132* 131* 132*  K 3.8 4.0 4.2  BUN 9 4* 5*  CREATININE 0.62 0.58 0.59  GLUCOSE 91 94 96   Assessment/Plan: 7 Days Post-Op Procedure(s): AMPUTATION ABOVE KNEE (RIGHT) APPLICATION OF WOUND VAC   Up with therapy Continue working with SW for placement with abilities to change wound vac.   Anastasio Auerbach Lourdez Mcgahan   PAC  12/24/2010, 11:17 AM

## 2010-12-24 NOTE — Progress Notes (Signed)
Physical Therapy Treatment Patient Details Name: ROSARIA KUBIN MRN: [id number] DOB: [birth year] Today's Date: 12/24/2010 [id number] 2Gt, 1Sc  PT Assessment/Plan  PT - Assessment/Plan Comments on Treatment Session: Patient with continued decreased activity tolerance, but able to ambulate some today despite slight decrease in BP (128/75 before activity, 119/74 after.)  Continues to benefit from skilled PT to address decreased stregnth, balance, gait, and mobility. PT Plan: Discharge plan remains appropriate PT Frequency: Min 4X/week Follow Up Recommendations: Skilled nursing facility (planned d/c to SNF tomorrow) Equipment Recommended: Defer to next venue PT Goals  Acute Rehab PT Goals PT Goal: Sit to Supine/Side - Progress: Met PT Transfer Goal: Bed to Chair/Chair to Bed - Progress: Progressing toward goal PT Goal: Stand - Progress: Progressing toward goal Pt will Ambulate: 1 - 15 feet;with max assist;with rolling walker PT Goal: Ambulate - Progress: Progressing toward goal  PT Treatment Precautions/Restrictions  Precautions Precautions: Fall Restrictions Weight Bearing Restrictions: No RLE Weight Bearing: Non weight bearing LLE Weight Bearing: Weight bearing as tolerated Mobility (including Balance) Bed Mobility Sit to Supine - Left: 5: Supervision Sit to Supine - Left Details (indicate cue type and reason): cue for safety, positioining in bed Transfers Sit to Stand: 1: +2 Total assist;From chair/3-in-1 Sit to Stand Details (indicate cue type and reason): pt= 30% with cues for technique/safety Stand to Sit: 1: +2 Total assist;To chair/3-in-1;To bed Stand to Sit Details: pt= 50% with cue for safety/technique Stand Pivot Transfers: 1: +2 Total assist Stand Pivot Transfer Details (indicate cue type and reason): pt= 70%, with RW, cues for technique Ambulation/Gait Ambulation/Gait Assistance: 1: +2 Total assist Ambulation/Gait Assistance Details (indicate cue type and  reason): pt= 40-50% Ambulation Distance (Feet): 10 Feet (times 2) Assistive device: Rolling walker    Exercise  General Exercises - Upper Extremity Chair Push Up: AROM;Both;Seated;5 reps (issued for HEP) General Exercises - Lower Extremity Ankle Circles/Pumps:  (issued HEP for LE strength (AKA exercises)) Also issued Post-Amputation information booklet End of Session PT - End of Session Equipment Utilized During Treatment: Gait belt Activity Tolerance: Patient limited by fatigue Patient left: in bed General Behavior During Session: [facility] for tasks performed Cognition: [facility] for tasks performed  [facility] 12/24/2010, 3:40 PM

## 2010-12-25 LAB — BASIC METABOLIC PANEL
CO2: 31 mEq/L (ref 19–32)
Chloride: 96 mEq/L (ref 96–112)
Creatinine, Ser: 0.72 mg/dL (ref 0.50–1.10)
GFR calc Af Amer: >90 mL/min (ref >90)
Potassium: 3.8 mEq/L (ref 3.5–5.1)

## 2010-12-25 LAB — CBC
MCV: 102.6 fL — ABNORMAL HIGH (ref 78.0–100.0)
Platelets: 386 10*3/uL (ref 150–400)
RBC: 2.67 MIL/uL — ABNORMAL LOW (ref 3.87–5.11)
WBC: 4.9 10*3/uL (ref 4.0–10.5)

## 2010-12-25 NOTE — Progress Notes (Signed)
Occupational Therapy Treatment Patient Details Name: MELANIA KIRKS MRN: [id number] DOB: 1945-08-29 Today's Date: 12/25/2010  OT Assessment/Plan OT Assessment/Plan OT Frequency: Min 2X/week Follow Up Recommendations: Skilled nursing facility Equipment Recommended: Defer to next venue OT Goals ADL Goals ADL Goal: Lower Body Bathing - Progress: Not addressed ADL Goal: Lower Body Dressing - Progress: Not addressed ADL Goal: Toilet Transfer - Progress: Progressing toward goals ADL Goal: Toileting - Hygiene - Progress: Not addressed ADL Goal: Additional Goal #1 - Progress: Progressing toward goals Arm Goals Additional Arm Goal #2:  (progressing toward goal with ue exercises)  OT Treatment Precautions/Restrictions  Precautions Precautions: Fall Restrictions Weight Bearing Restrictions: No RLE Weight Bearing: Non weight bearing LLE Weight Bearing: Weight bearing as tolerated   ADL ADL Toilet Transfer: Performed Toilet Transfer Details (indicate cue type and reason): inst. cues for hand placement and sequencing Toilet Transfer Method: Stand pivot Acupuncturist: Bedside commode ADL Comments: pt. eager to participate, able to perform transfer to/from 3-n-1, also performed ub exercises Mobility  Bed Mobility Bed Mobility: Yes Rolling Right: 5: Supervision Supine to Sit: 5: Supervision Sitting - Scoot to Edge of Bed: 5: Supervision Sit to Supine - Left: 5: Supervision Transfers Transfers: Yes Sit to Stand: 4: Min assist Stand to Sit: 4: Min assist Exercises General Exercises - Upper Extremity Chair Push Up: 5 reps  End of Session OT - End of Session Activity Tolerance: Patient tolerated treatment well Patient left: in bed;with call bell in reach General Behavior During Session: [facility] for tasks performed Cognition: [facility] for tasks performed  Robet Leu COTA/L 12/25/2010, 11:45 AM

## 2010-12-25 NOTE — Progress Notes (Signed)
Physical Therapy Treatment Patient Details Name: [name] MRN: [id number] DOB: [birth year] Today's Date: 12/25/[phone] 1Ta, 1Te  PT Assessment/Plan  PT - Assessment/Plan Comments on Treatment Session: Patient with much improved mobility status today with only one assist needed for sit to/from stand.  Eager for progress and encouraged HEP practice again later today on her own.  PT Frequency: Min 4X/week Follow Up Recommendations: Skilled nursing facility Equipment Recommended: Defer to next venue PT Goals  Acute Rehab PT Goals PT Goal: Supine/Side to Sit - Progress: Progressing toward goal PT Goal: Sit to Supine/Side - Progress: Progressing toward goal PT Transfer Goal: Bed to Chair/Chair to Bed - Progress: Progressing toward goal PT Goal: Stand - Progress: Progressing toward goal  PT Treatment Precautions/Restrictions  Precautions Precautions: Fall Restrictions Weight Bearing Restrictions: No RLE Weight Bearing: Non weight bearing LLE Weight Bearing: Weight bearing as tolerated Mobility (including Balance) Bed Mobility Bed Mobility: Yes Rolling Right: 6: Modified independent (Device/Increase time) Supine to Sit: 5: Supervision Supine to Sit Details (indicate cue type and reason): for safety Sitting - Scoot to Edge of Bed: 6: Modified independent (Device/Increase time) Sit to Supine - Left: 5: Supervision Sit to Supine - Left Details (indicate cue type and reason): for safety Transfers Sit to Stand: 4: Min assist Sit to Stand Details (indicate cue type and reason): from elevated bed with 3:1 in front to encourage forward weight shift Stand to Sit: 4: Min assist Stand to Sit Details: cues for controlled descent Stand Pivot Transfers: 4: Min assist Stand Pivot Transfer Details (indicate cue type and reason): with rolling walker and scooting left foot  Static Standing Balance Static Standing - Balance Support: Left upper extremity supported (periods with one  versus two UE support) Static Standing - Level of Assistance: 4: Min assist Static Standing - Comment/# of Minutes: standing total of 1 minute times two (once for balance, once for perineal hygiene) Exercise  General Exercises - Lower Extremity Quad Sets: AROM;Right;10 reps;Supine Gluteal Sets: AROM;Both;10 reps;Supine Hip ABduction/ADduction: AROM;Right;10 reps;Supine Straight Leg Raises: AROM;Right;10 reps;Supine Hip Flexion/Marching: AROM;Right;10 reps;Supine Other Exercises Other Exercises: hip extension with adduction isometrics 10 reps in supine End of Session PT - End of Session Activity Tolerance: Patient tolerated treatment well Patient left: with call bell in reach;in bed Nurse Communication: Mobility status for transfers General Behavior During Session: [facility] for tasks performed Cognition: [facility] for tasks performed  [facility] 12/25/2010, 12:22 PM

## 2010-12-25 NOTE — Progress Notes (Signed)
Nutrition Brief Note:  Pt intake improved slightly, PO 35-75% of meals. She is also consuming the Juven BID.  Pt feels her appetite has improved and she continues to be highly motivated to continue to improve.  Labs and meds reviewed. Inadequate oral intake continues- slight improvement noted. Continue current management, RD to continue to follow.  Pager: [phone]

## 2010-12-25 NOTE — Progress Notes (Signed)
Patient has bed available at [facility] in [place] on Monday if stable for discharge.

## 2010-12-25 NOTE — Progress Notes (Signed)
[facility] Progress Note  12/25/2010  Subjective: No acute changes.  Pt. continues working with P.T.  Ortho wants pt to continue working with PT through weekend.  I spoke with ortho group to confirm.  Objective:  Vital signs in last 24 hours: Filed Vitals:   12/24/10 1422 12/24/10 2105 12/25/10 0434 12/25/10 1001  BP: 100/67 106/49 107/65   Pulse: 94 62 62 62  Temp: 97.6 F (36.4 C) 98.5 F (36.9 C) 97.7 F (36.5 C)   TempSrc: Axillary Oral Oral   Resp: 18 18 18    Height:      Weight:      SpO2: 94% 94% 94%    Weight change:   Intake/Output Summary (Last 24 hours) at 12/25/10 1055 Last data filed at 12/25/10 0715  Gross per 24 hour  Intake    120 ml  Output   2551 ml  Net  -2431 ml    Review of Systems A comprehensive review of systems was negative except for: Musculoskeletal: positive for gait instability, imbalance, right AKA  Physical Exam General appearance: alert, cooperative, appears stated age and no distress Head: Normocephalic, without obvious abnormality, atraumatic Extremities: right AKA (wound VAC in place) Neurologic: Grossly normal  Lab Results: Results for orders placed during the hospital encounter of 12/14/10 (from the past 24 hour(s))  CBC     Status: Abnormal   Collection Time   12/25/10  4:42 AM      Component Value Range   WBC 4.9  4.0 - 10.5 (K/uL)   RBC 2.67 (*) 3.87 - 5.11 (MIL/uL)   Hemoglobin 9.5 (*) 12.0 - 15.0 (g/dL)   HCT 04.5 (*) 40.9 - 46.0 (%)   MCV 102.6 (*) 78.0 - 100.0 (fL)   MCH 35.6 (*) 26.0 - 34.0 (pg)   MCHC 34.7  30.0 - 36.0 (g/dL)   RDW 81.1 (*) 91.4 - 15.5 (%)   Platelets 386  150 - 400 (K/uL)  BASIC METABOLIC PANEL     Status: Abnormal   Collection Time   12/25/10  4:42 AM      Component Value Range   Sodium 133 (*) 135 - 145 (mEq/L)   Potassium 3.8  3.5 - 5.1 (mEq/L)   Chloride 96  96 - 112 (mEq/L)   CO2 31  19 - 32 (mEq/L)   Glucose, Bld 82  70 - 99 (mg/dL)   BUN 15  6 - 23 (mg/dL)   Creatinine, Ser 7.82  0.50 - 1.10 (mg/dL)   Calcium 8.7  8.4 - 95.6 (mg/dL)   GFR calc non Af Amer 88 (*) >90 (mL/min)   GFR calc Af Amer >90  >90 (mL/min)    Micro Results: Recent Results (from the past 240 hour(s))  WOUND CULTURE     Status: Normal   Collection Time   12/16/10  4:08 PM      Component Value Range Status Comment   Specimen Description LEG   Final    Special Requests Normal   Final    Gram Stain     Final    Value: MODERATE WBC PRESENT, PREDOMINANTLY PMN     NO SQUAMOUS EPITHELIAL CELLS SEEN     NO ORGANISMS SEEN   Culture FEW STAPHYLOCOCCUS SPECIES (COAGULASE NEGATIVE)   Final    Report Status 12/18/2010 FINAL   Final     Studies/Results: No results found.  Medications:  Scheduled Meds:   . amiodarone  200 mg Oral Daily  . chlorhexidine  15 mL  Mouth/Throat BID  . dabigatran  150 mg Oral BID  . digoxin  0.125 mg Oral Daily  . docusate sodium  100 mg Oral BID  . ferrous sulfate  325 mg Oral TID PC  . Juven  1 packet Oral BID  . midodrine  5 mg Oral TID WC   Continuous Infusions:  PRN Meds:.acetaminophen, acetaminophen, alum & mag hydroxide-simeth, cyclobenzaprine, diphenhydrAMINE, HYDROcodone-acetaminophen, HYDROmorphone, metoCLOPramide (REGLAN) injection, metoCLOPramide, ondansetron (ZOFRAN) IV, ondansetron, polyethylene glycol, promethazine, senna-docusate, sodium phosphate, traZODone, DISCONTD: zolpidem  Assessment/Plan:  Atrial fibrillation with RVR - currently rate controlled, continue current medications. appreciate cardiology input. Pt to f/u with Dr. Chales Abrahams in [place] at [place], monitoring on telemetry.  Hypotension, unspecified - chronic, pt totally asymptomatic  Cellulitis and abscess of leg - Completed Primaxin, Vancomycin 10 days. Continue wound VAC care. Pt will need ongoing rehab s/p having the Right AKA. Working with CSW to disposition properly.  Per Ortho, pt will continue inpatient PT through weekend. E.coli UTI - treated fully  Anemia -  stable Hg, following  Dental Prosthesis in place and doing very well.  LLE edema - clinically improved; Repeat Doppler US neg for DVT.     LOS: 11 days   Demarr Kluever 12/25/2010, 10:55 AM   Cleora Fleet, MD, CDE, FAAFP [facility] [place], [place]

## 2010-12-26 LAB — URINALYSIS, ROUTINE W REFLEX MICROSCOPIC
Glucose, UA: NEGATIVE mg/dL
Nitrite: NEGATIVE
Specific Gravity, Urine: 1.006 (ref 1.005–1.030)
pH: 8 (ref 5.0–8.0)

## 2010-12-26 LAB — URINE MICROSCOPIC-ADD ON

## 2010-12-26 MED ORDER — PANTOPRAZOLE SODIUM 40 MG PO TBEC
40.0000 mg | DELAYED_RELEASE_TABLET | Freq: Every day | ORAL | Status: DC
  Administered 2010-12-26: 40 mg via ORAL
  Filled 2010-12-26 (×2): qty 1

## 2010-12-26 NOTE — Progress Notes (Signed)
Physical Therapy Treatment Patient Details Name: [name] MRN: [id number] DOB: 1945-07-04 Today's Date: 12/26/2010 Time: 4098-1191 Charge: TA 2  PT Assessment/Plan  PT - Assessment/Plan Comments on Treatment Session: Pt required mod assist today for transfers to/from [facility].  Pt reports she is familiar with her HEP and will perform later today.  Pt did perform 8 quick chair push ups while PT in room. PT Plan: Discharge plan remains appropriate;Frequency remains appropriate Follow Up Recommendations: Skilled nursing facility Equipment Recommended: Defer to next venue PT Goals  Acute Rehab PT Goals PT Goal: Supine/Side to Sit - Progress: Progressing toward goal PT Transfer Goal: Bed to Chair/Chair to Bed - Progress: Progressing toward goal PT Goal: Stand - Progress: Progressing toward goal  PT Treatment Precautions/Restrictions  Precautions Precautions: Fall Restrictions Weight Bearing Restrictions: No RLE Weight Bearing: Non weight bearing LLE Weight Bearing: Weight bearing as tolerated Mobility (including Balance) Bed Mobility Bed Mobility: Yes Supine to Sit: 5: Supervision;HOB elevated (Comment degrees) Supine to Sit Details (indicate cue type and reason): for safety and managing wound vac line.  pt's bed soiled with urine.  RN notified and came to look reporting probable bladder spasms (pt with foley catheter in place) Sitting - Scoot to Edge of Bed: 6: Modified independent (Device/Increase time) Transfers Transfers: Yes Sit to Stand: 3: Mod assist;From elevated surface;From bed;From chair/3-in-1;With armrests Sit to Stand Details (indicate cue type and reason): from elevated bed and again from elevated 3 in 1, increased time to bring 2nd hand up to RW, assist for weakness Stand to Sit: 3: Mod assist;With armrests;To chair/3-in-1 Stand to Sit Details: assist for controlling descent as pt fatigues quickly Stand Pivot Transfers: 3: Mod assist Stand Pivot Transfer  Details (indicate cue type and reason): with RW.  pt unable to lift L foot from floor, so scooting/sliding foot to transfer with increased assist for balance and weakness. Wheelchair Mobility Wheelchair Mobility: No (Pt could not today 2* bladder spasms, preferred recliner )  Static Standing Balance Static Standing - Balance Support: Bilateral upper extremity supported;During functional activity Static Standing - Level of Assistance: 4: Min assist Static Standing - Comment/# of Minutes: approximately 2 minutes of standing upon sit to stand from [facility] for nsg tech to perform hygiene and clean backside from urine (since wet in bed) Exercise    End of Session PT - End of Session Equipment Utilized During Treatment: Gait belt Activity Tolerance: Patient limited by fatigue Patient left: in chair;with call bell in reach General Behavior During Session: [facility] for tasks performed Cognition: [facility] for tasks performed  Trannie Bardales,KATHrine E 12/26/2010, 11:27 AM Pager: [phone]

## 2010-12-26 NOTE — Progress Notes (Signed)
PROGRESS NOTE  [name] WUJ:[id number] DOB: [birth year] DOA: 12/14/2010  Brief narrative: 65 year old woman admitted November 5 after she presented to the hospital with right leg pain. 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 Upon presentation the emergency room the patient was found to be in atrial fibrillation with rapid ventricular response and hypotensive. The patient was admitted to the hospitalist service for atrial fibrillation with rapid ventricular response, cellulitis of the right lower extremity, postoperative; possible UTI, hyponatremia. She was started on vancomycin and Primaxin.  Atrial fibrillation was quickly controlled and cardiology was consulted per patient request. Hypotension persisted and consideration was given to sepsis as an etiology. The patient was seen by vascular surgery but not felt to have any arterial issues. She was evaluated by Dr. Charlann Boxer and ultimately underwent above-the-knee amputation for definitive control of infection. She was evaluated by physical and occupational therapy as well as physical medicine and rehabilitation and ultimately recommended for skilled nursing facility placement.  She developed dramatic left lower extremity edema on November 13.  Past medical history: Hypertension, asthma, atrial fibrillation (multiple cardioversions, ablation; on Pradaxa and amiodarone), GERD, chronic bronchitis/COPD, diastolic congestive heart failure, rheumatoid arthritis, B12 deficiency, DVT of the right leg, total knee arthroplasty with infection  Consultants: Orthopedics: Recommend continuing IV vancomycin until wound fully sealed. Cardiology: Placed on digoxin and restarted on amiodarone Vascular surgery: Knee joint infection suspected superficial gangrenous changes in the skin. No need for further vascular evaluation. Wound care: 4 management of wound VAC. Physical medicine and rehabilitation: Medicated secondary to lack of  social support. Occupational therapy: Inpatient rehabilitation recommended Physical therapy: Inpatient rehabilitation recommended Dental medicine: Consult of her denture insertion. Nutrition: Moderate malnutrition in the context of chronic illness  Procedures: 2-D echocardiogram: Lower extremity venous Dopplers: Negative. 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 Transfusion packed red blood cells: 3 units total on November 8. Denture insertion: November 12.  Interim History: No issues overnight. Subjective: Doing well.  Objective: Filed Vitals:   12/26/10 0543  BP: 112/66  Pulse: 58  Temp: 97.4 F (36.3 C)  Resp: 18   Blood pressure 112/66, pulse 58, temperature 97.4 F (36.3 C), temperature source Oral, resp. rate 18, height 5\' 11"  (1.803 m), weight 90.4 kg (199 lb 4.7 oz), SpO2 95.00%.   Intake/Output Summary (Last 24 hours) at 12/26/10 1209 Last data filed at 12/26/10 0900  Gross per 24 hour  Intake    480 ml  Output   1000 ml  Net   -520 ml    Exam: General: Sitting up in chair and reading paper. Appears well. Cardiovascular: Irregular rhythm. Normal rate. No murmur, rub or gallop. Respiratory: Clear to auscultation bilaterally. No wheezes, rales or rhonchi.  Assessment/Plan: Postoperative knee joint infection: Completed antibiotic therapy. Status post above-the-knee amputation: Continues with physical and occupational therapy. Atrial fibrillation with rapid ventricular response: Rate controlled. Continue amiodarone 200 mg by mouth daily and digoxin. Continue Pradaxa. Followup with her primary cardiologist as an outpatient as she is not tolerate atrial fibrillation well. Hypotension: Resolved. Hyponatremia: Slowly improving. Etiology unclear. Has been seen intermittently in the past according to previous records. Urinary tract infection: Resolved. Anemia of acute illness: Stable status post 3 units packed  red blood cells.  Code Status: Full.  Disposition Plan: Skilled nursing facility November 19.    PCP: Levi Aland, MD Orthopedic surgeon: Durene Romans, M.D. Cardiologist: Constance Haw, M.D. [place]   LOS: 12 days  Pandora Leiter MD   Data Reviewed: Results for orders placed during the hospital encounter of 12/14/10 (from the past 24 hour(s))  URINALYSIS, ROUTINE W REFLEX MICROSCOPIC     Status: Abnormal   Collection Time   12/26/10  3:55 AM      Component Value Range   Color, Urine YELLOW  YELLOW    Appearance CLEAR  CLEAR    Specific Gravity, Urine 1.006  1.005 - 1.030    pH 8.0  5.0 - 8.0    Glucose, UA NEGATIVE  NEGATIVE (mg/dL)   Hgb urine dipstick NEGATIVE  NEGATIVE    Bilirubin Urine NEGATIVE  NEGATIVE    Ketones, ur TRACE (*) NEGATIVE (mg/dL)   Protein, ur NEGATIVE  NEGATIVE (mg/dL)   Urobilinogen, UA 0.2  0.0 - 1.0 (mg/dL)   Nitrite NEGATIVE  NEGATIVE    Leukocytes, UA TRACE (*) NEGATIVE   URINE MICROSCOPIC-ADD ON     Status: Normal   Collection Time   12/26/10  3:55 AM      Component Value Range   WBC, UA 0-2  <3 (WBC/hpf)   Urine-Other MANY YEAST      Recent Results (from the past 240 hour(s))  WOUND CULTURE     Status: Normal   Collection Time   12/16/10  4:08 PM      Component Value Range Status Comment   Specimen Description LEG   Final    Special Requests Normal   Final    Gram Stain     Final    Value: MODERATE WBC PRESENT, PREDOMINANTLY PMN     NO SQUAMOUS EPITHELIAL CELLS SEEN     NO ORGANISMS SEEN   Culture FEW STAPHYLOCOCCUS SPECIES (COAGULASE NEGATIVE)   Final    Report Status 12/18/2010 FINAL   Final      Studies/Results: Dg Chest 2 View  12/09/2010  *RADIOLOGY REPORT*  Clinical Data: Cough, congestion, occasional shortness of breath, evaluate for pneumonia  CHEST - 2 VIEW  Comparison: 04/24/2010  Findings: Unchanged cardiac silhouette and mediastinal contours with mild tortuosity of the thoracic aorta.  No focal  parenchymal opacities.  No pleural effusion or pneumothorax.  Old deformity of the posterior lateral aspect of the left fourth and fifth ribs.  IMPRESSION: No acute cardiopulmonary disease.  Specifically, no evidence of pneumonia.  Original Report Authenticated By: Waynard Reeds, M.D.   Dg Chest Portable 1 View  12/14/2010  *RADIOLOGY REPORT*  Clinical Data: Shortness of breath, cough, congestion, former smoker  PORTABLE CHEST - 1 VIEW  Comparison: Chest x-ray of 12/09/2010  Findings:  The lungs are clear and slightly hyperaerated. Mediastinal contours are stable.  The heart is within upper limits of normal in stable.  No bony abnormality is seen.  IMPRESSION: No active lung disease.  No change in hyperaeration.  Original Report Authenticated By: Juline Patch, M.D.   Dg Knee Complete 4 Views Right  12/14/2010  *RADIOLOGY REPORT*  Clinical Data: Recent knee replacement, now with pain and swelling.  RIGHT KNEE - COMPLETE 4+ VIEW  Comparison: 11/23/1997 report  Findings: Status post left knee arthroplasty without overt evidence for hardware complication. No acute osseous abnormality. Skin staples project anteriorly.  There is underlying subcutaneous gas which is nonspecific in the recently postoperative state.  IMPRESSION: Status post right knee arthroplasty.  Subcutaneous gas anteriorly is nonspecific in the recently postoperative state, however infection not excluded.  Original Report Authenticated By: Waneta Martins, M.D.     Scheduled Meds:   .  amiodarone  200 mg Oral Daily  . chlorhexidine  15 mL Mouth/Throat BID  . dabigatran  150 mg Oral BID  . digoxin  0.125 mg Oral Daily  . docusate sodium  100 mg Oral BID  . ferrous sulfate  325 mg Oral TID PC  . Juven  1 packet Oral BID  . midodrine  5 mg Oral TID WC  . pantoprazole  40 mg Oral Q1200   Continuous Infusions:

## 2010-12-27 MED ORDER — OMEPRAZOLE 20 MG PO CPDR
40.0000 mg | DELAYED_RELEASE_CAPSULE | Freq: Every day | ORAL | Status: DC
  Administered 2010-12-27 – 2010-12-28 (×2): 40 mg via ORAL
  Filled 2010-12-27 (×2): qty 2

## 2010-12-27 MED ORDER — NON FORMULARY: 40.0000 mg | Freq: Once | Status: DC

## 2010-12-27 NOTE — Progress Notes (Signed)
Subjective: 10 Days Post-Op Procedure(s): AMPUTATION ABOVE KNEE (RIGHT) APPLICATION OF WOUND VAC  Patient reports pain as moderate. Pain is ok, the muscle spasms are causing more issues of pain. Also complaining of GERD, used to Prilosec which works to control her GERD  Objective:   VITALS:   Filed Vitals:   12/27/10 0557  BP: 116/66  Pulse: 64  Temp: 98.4 F (36.9 C)  Resp: 20   LABS: No new labs  Incision: dressing C/D/I No cellulitis present Compartment soft  Assessment/Plan: 10 Days Post-Op Procedure(s): AMPUTATION ABOVE KNEE (RIGHT) APPLICATION OF WOUND VAC  Change from current acid reflux med to Prilosec. Up with therapy Discharge to SNF Monday from an Orthopedic standpoint.   Anastasio Auerbach Soleia Badolato   PAC  12/27/2010, 9:27 AM

## 2010-12-27 NOTE — Progress Notes (Signed)
Physical Therapy Treatment Patient Details Name: [name] MRN: [id number] DOB: [birth year] Today's Date: 12/27/2010 4098-1191 Te, 2Ta PT Assessment/Plan  PT - Assessment/Plan Comments on Treatment Session: Patient complains of generalized soreness and left shoulder pain with possible rotator cuff tear.  She is progressing with mobility and will benefit from continued skilled PT in next venue of care. PT Plan: Discharge plan remains appropriate PT Frequency: Min 4X/week Follow Up Recommendations: Skilled nursing facility Equipment Recommended: Defer to next venue PT Goals  Acute Rehab PT Goals PT Goal: Supine/Side to Sit - Progress: Progressing toward goal PT Goal: Sit to Supine/Side - Progress: Progressing toward goal PT Goal: Stand - Progress: Progressing toward goal  PT Treatment Precautions/Restrictions  Precautions Precautions: Fall Restrictions Weight Bearing Restrictions: No RLE Weight Bearing: Non weight bearing LLE Weight Bearing: Non weight bearing Mobility (including Balance) Bed Mobility Supine to Sit: 5: Supervision Supine to Sit Details (indicate cue type and reason): For safety and managing lines Sitting - Scoot to Edge of Bed: 6: Modified independent (Device/Increase time) Sit to Supine - Left: 5: Supervision Sit to Supine - Left Details (indicate cue type and reason): for safety Transfers Sit to Stand: 3: Mod assist;4: Min assist Sit to Stand Details (indicate cue type and reason): times five reps from bed to rolling walker with decreasing levels of assist with more reps Stand to Sit: 3: Mod assist;To bed Stand to Sit Details: to controll descent  Static Standing Balance Static Standing - Balance Support: Bilateral upper extremity supported (on rolling walker) Static Standing - Level of Assistance: 4: Min assist Static Standing - Comment/# of Minutes: cues for trunk and hip extension stood max 45 sec with reps of right hip extension Exercise    Amputee Exercises Quad Sets: Right;AROM;10 reps;Supine Gluteal Sets: AROM;Right;10 reps;Supine Towel Squeeze: AROM;Right;10 reps;Supine Hip Extension: AROM;Right;10 reps;Sidelying (and 3-5 reps standing) Hip ABduction/ADduction: Right;AROM;10 reps;Supine Hip Flexion/Marching: AROM;Right;10 reps;Supine End of Session PT - End of Session Equipment Utilized During Treatment: Gait belt Activity Tolerance: Patient tolerated treatment well (though pain increases with activity PA made aware) Patient left: in bed General Behavior During Session: [facility] for tasks performed Cognition: [facility] for tasks performed  [facility] 12/27/2010, 12:58 PM

## 2010-12-27 NOTE — Progress Notes (Signed)
PROGRESS NOTE  [name] AVW:[id number] DOB: Aug 09, 1945 DOA: 12/14/2010  Brief narrative: 65 year old woman admitted November 5 after she presented to the hospital with right leg pain. 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 Upon presentation the emergency room the patient was found to be in atrial fibrillation with rapid ventricular response and hypotensive. The patient was admitted to the hospitalist service for atrial fibrillation with rapid ventricular response, cellulitis of the right lower extremity, postoperative; possible UTI, hyponatremia. She was started on vancomycin and Primaxin.  Atrial fibrillation was quickly controlled and cardiology was consulted per patient request. Hypotension persisted and consideration was given to sepsis as an etiology. The patient was seen by vascular surgery but not felt to have any arterial issues. She was evaluated by Dr. Charlann Boxer and ultimately underwent above-the-knee amputation for definitive control of infection. She was evaluated by physical and occupational therapy as well as physical medicine and rehabilitation and ultimately recommended for skilled nursing facility placement.  She developed dramatic left lower extremity edema on November 13.  Past medical history: Hypertension, asthma, atrial fibrillation (multiple cardioversions, ablation; on Pradaxa and amiodarone), GERD, chronic bronchitis/COPD, diastolic congestive heart failure, rheumatoid arthritis, B12 deficiency, DVT of the right leg, total knee arthroplasty with infection  Consultants: Orthopedics: Recommend continuing IV vancomycin until wound fully sealed. Cardiology: Placed on digoxin and restarted on amiodarone Vascular surgery: Knee joint infection suspected superficial gangrenous changes in the skin. No need for further vascular evaluation. Wound care: 4 management of wound VAC. Physical medicine and rehabilitation: Medicated secondary to lack of  social support. Occupational therapy: Inpatient rehabilitation recommended Physical therapy: Inpatient rehabilitation recommended Dental medicine: Consult of her denture insertion. Nutrition: Moderate malnutrition in the context of chronic illness  Procedures: 2-D echocardiogram: Lower extremity venous Dopplers: Negative. 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 Transfusion packed red blood cells: 3 units total on November 8. Denture insertion: November 12.  Interim History: No issues overnight. Subjective: Doing well.  Objective: Filed Vitals:   12/27/10 1315  BP: 90/51  Pulse: 54  Temp: 97.4 F (36.3 C)  Resp: 19   Blood pressure 90/51, pulse 54, temperature 97.4 F (36.3 C), temperature source Oral, resp. rate 19, height 5\' 11"  (1.803 m), weight 90.4 kg (199 lb 4.7 oz), SpO2 94.00%.   Intake/Output Summary (Last 24 hours) at 12/27/10 1448 Last data filed at 12/27/10 1400  Gross per 24 hour  Intake   1080 ml  Output   1650 ml  Net   -570 ml    Exam: General: Appears well. Cardiovascular: Irregular rhythm. Normal rate. No murmur, rub or gallop. Telemetry: Atrial fibrillation. Respiratory: Clear to auscultation bilaterally. No wheezes, rales or rhonchi.  Assessment/Plan: 1. Postoperative knee joint infection: Completed antibiotic therapy. 2. Status post above-the-knee amputation: Continues with physical and occupational therapy. 3. Atrial fibrillation with rapid ventricular response: Rate controlled. Continue amiodarone 200 mg by mouth daily and digoxin. Continue Pradaxa. Followup with her primary cardiologist as an outpatient as she is not tolerate atrial fibrillation well. 4. Hypotension: Resolved. 5. Hyponatremia: Slowly improving. Etiology unclear. Has been seen intermittently in the past according to previous records. 6. Urinary tract infection: Resolved. 7. Anemia of acute illness: Stable status post 3  units packed red blood cells.  Code Status: Full.  Disposition Plan: Skilled nursing facility November 19.    PCP: Levi Aland, MD Orthopedic surgeon: Durene Romans, M.D. Cardiologist: Constance Haw, M.D. [place]   LOS:  13 days   Pandora Leiter MD   Data Reviewed: No results found for this or any previous visit (from the past 24 hour(s)).  No results found for this or any previous visit (from the past 240 hour(s)).   Studies/Results: Dg Chest 2 View  12/09/2010  *RADIOLOGY REPORT*  Clinical Data: Cough, congestion, occasional shortness of breath, evaluate for pneumonia  CHEST - 2 VIEW  Comparison: 04/24/2010  Findings: Unchanged cardiac silhouette and mediastinal contours with mild tortuosity of the thoracic aorta.  No focal parenchymal opacities.  No pleural effusion or pneumothorax.  Old deformity of the posterior lateral aspect of the left fourth and fifth ribs.  IMPRESSION: No acute cardiopulmonary disease.  Specifically, no evidence of pneumonia.  Original Report Authenticated By: Waynard Reeds, M.D.   Dg Chest Portable 1 View  12/14/2010  *RADIOLOGY REPORT*  Clinical Data: Shortness of breath, cough, congestion, former smoker  PORTABLE CHEST - 1 VIEW  Comparison: Chest x-ray of 12/09/2010  Findings:  The lungs are clear and slightly hyperaerated. Mediastinal contours are stable.  The heart is within upper limits of normal in stable.  No bony abnormality is seen.  IMPRESSION: No active lung disease.  No change in hyperaeration.  Original Report Authenticated By: Juline Patch, M.D.   Dg Knee Complete 4 Views Right  12/14/2010  *RADIOLOGY REPORT*  Clinical Data: Recent knee replacement, now with pain and swelling.  RIGHT KNEE - COMPLETE 4+ VIEW  Comparison: 11/23/1997 report  Findings: Status post left knee arthroplasty without overt evidence for hardware complication. No acute osseous abnormality. Skin staples project anteriorly.  There is underlying  subcutaneous gas which is nonspecific in the recently postoperative state.  IMPRESSION: Status post right knee arthroplasty.  Subcutaneous gas anteriorly is nonspecific in the recently postoperative state, however infection not excluded.  Original Report Authenticated By: Waneta Martins, M.D.     Scheduled Meds:    . amiodarone  200 mg Oral Daily  . chlorhexidine  15 mL Mouth/Throat BID  . dabigatran  150 mg Oral BID  . digoxin  0.125 mg Oral Daily  . docusate sodium  100 mg Oral BID  . ferrous sulfate  325 mg Oral TID PC  . Juven  1 packet Oral BID  . midodrine  5 mg Oral TID WC  . omeprazole  40 mg Oral Daily  . DISCONTD: NON FORMULARY 40 mg  40 mg Oral Once  . DISCONTD: pantoprazole  40 mg Oral Q1200   Continuous Infusions:

## 2010-12-28 DIAGNOSIS — Z89619 Acquired absence of unspecified leg above knee: Secondary | ICD-10-CM

## 2010-12-28 MED ORDER — CYCLOBENZAPRINE HCL 10 MG PO TABS: 10.0000 mg | ORAL_TABLET | Freq: Three times a day (TID) | ORAL | Status: DC | PRN

## 2010-12-28 MED ORDER — OMEPRAZOLE 40 MG PO CPDR: 40.0000 mg | DELAYED_RELEASE_CAPSULE | Freq: Every day | ORAL | Status: DC

## 2010-12-28 MED ORDER — SENNOSIDES-DOCUSATE SODIUM 8.6-50 MG PO TABS: 1.0000 | ORAL_TABLET | Freq: Every evening | ORAL | Status: AC | PRN

## 2010-12-28 MED ORDER — HYDROCODONE-ACETAMINOPHEN 7.5-325 MG PO TABS: 1.0000 | ORAL_TABLET | Freq: Four times a day (QID) | ORAL | Status: DC | PRN

## 2010-12-28 MED ORDER — JUVEN PO PACK: 1.0000 | PACK | Freq: Two times a day (BID) | ORAL | Status: DC

## 2010-12-28 MED ORDER — DABIGATRAN ETEXILATE MESYLATE 150 MG PO CAPS: 150.0000 mg | ORAL_CAPSULE | Freq: Two times a day (BID) | ORAL | Status: DC

## 2010-12-28 MED ORDER — CYCLOBENZAPRINE HCL 10 MG PO TABS: 10.0000 mg | ORAL_TABLET | Freq: Three times a day (TID) | ORAL | Status: AC | PRN

## 2010-12-28 MED ORDER — FERROUS SULFATE 325 (65 FE) MG PO TABS: 325.0000 mg | ORAL_TABLET | Freq: Three times a day (TID) | ORAL | Status: DC

## 2010-12-28 MED ORDER — TRAZODONE HCL 50 MG PO TABS: 50.0000 mg | ORAL_TABLET | Freq: Every evening | ORAL | Status: DC | PRN

## 2010-12-28 MED ORDER — AMIODARONE HCL 200 MG PO TABS: 200.0000 mg | ORAL_TABLET | Freq: Every day | ORAL | Status: DC

## 2010-12-28 MED ORDER — POLYETHYLENE GLYCOL 3350 17 G PO PACK: 17.0000 g | PACK | Freq: Every day | ORAL | Status: AC | PRN

## 2010-12-28 MED ORDER — HYDROCODONE-ACETAMINOPHEN 7.5-325 MG PO TABS: 1.0000 | ORAL_TABLET | Freq: Four times a day (QID) | ORAL | Status: AC | PRN

## 2010-12-28 MED ORDER — DIGOXIN 125 MCG PO TABS: 0.1250 mg | ORAL_TABLET | Freq: Every day | ORAL | Status: DC

## 2010-12-28 MED ORDER — MIDODRINE HCL 5 MG PO TABS: 5.0000 mg | ORAL_TABLET | Freq: Three times a day (TID) | ORAL | Status: AC

## 2010-12-28 NOTE — Progress Notes (Signed)
Subjective: 11 Days Post-Op Procedure(s) (LRB): AMPUTATION ABOVE KNEE (Right) APPLICATION OF WOUND VAC (Right)   Patient reports pain as mild.  Objective:   VITALS:   Filed Vitals:   12/28/10 0245  BP: 104/61  Pulse: 60  Temp: 97.8 F (36.6 C)  Resp: 18    Incision: dressing C/D/I No cellulitis present Compartment soft  LABS No new labs  Assessment/Plan: 11 Days Post-Op Procedure(s) (LRB): AMPUTATION ABOVE KNEE (Right) APPLICATION OF WOUND VAC (Right)   Up with therapy Discharge to SNF when place available, hopefully today. Patient feels as if she is ready to be discharged to SNF.   Anastasio Auerbach Adrean Findlay   PAC  12/28/2010, 7:25 AM

## 2010-12-28 NOTE — Progress Notes (Signed)
Ambulance transport here to transport pt to snf.  [facility] for [facility] at [facility] time in stable condition.

## 2010-12-28 NOTE — Discharge Summary (Signed)
Physician Discharge Summary  [name] BJY:[id number] DOB: 04/25/1945 DOA: 12/14/2010  PCP: Levi Aland, MD Orthopedic surgeon: Durene Romans, M.D.  Cardiologist: Constance Haw, M.D. [place]  Admit date: 12/14/2010 Discharge date: 12/28/2010  Discharge Diagnoses:  1. Postoperative right knee joint infection 2. 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 Atrial fibrillation with rapid ventricular response, stable.  Discharge Condition: Improved  Disposition: Skilled nursing facility for postoperative rehabilitation.  History of present illness:  65 year old woman admitted November 5 after she presented to the hospital with right leg pain. 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 Upon presentation the emergency room the patient was found to be in atrial fibrillation with rapid ventricular response and hypotensive.  Hospital Course:  The patient was admitted to the hospitalist service for atrial fibrillation with rapid ventricular response, cellulitis of the right lower extremity, postoperative; possible UTI, hyponatremia. She was started on vancomycin and Primaxin.   Atrial fibrillation was quickly controlled and cardiology was consulted per patient request. Hypotension persisted and consideration was given to sepsis as an etiology. The patient was seen by vascular surgery but not felt to have any arterial issues. She was evaluated by Dr. Charlann Boxer and ultimately underwent above-the-knee amputation for definitive control of infection. She was evaluated by physical and occupational therapy as well as physical medicine and rehabilitation and ultimately recommended for skilled nursing facility placement.   Patient will continue on amiodarone, per DACs and digoxin for atrial fibrillation and followup with her primary cardiologist.  Consultants:   Orthopedics: Recommend continuing IV vancomycin until wound fully sealed.    Cardiology: Placed on digoxin and restarted on amiodarone   Vascular surgery: Knee joint infection suspected superficial gangrenous changes in the skin. No need for further vascular evaluation.   Wound care: For management of wound VAC.   Physical medicine and rehabilitation: Medicated secondary to lack of social support.   Occupational therapy: Inpatient rehabilitation recommended   Physical therapy: Inpatient rehabilitation recommended   Dental medicine: Consult of her denture insertion.   Nutrition: Moderate malnutrition in the context of chronic illness   Procedures:   2-D echocardiogram: Left ventricle: The cavity size was normal. Wall thickness was normal. Systolic function was normal. The estimated ejection fraction was in the range of 60% to 65%.  Lower extremity venous Dopplers: Negative.   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   Transfusion packed red blood cells: 3 units total on November 8.   Denture insertion: November 12.   Discharge Exam: Subjective:  Doing well.   Filed Vitals:   12/28/10 0620  BP: 108/68  Pulse: 60  Temp: 97.7 F (36.5 C)  Resp: 16   Exam:  General: Appears well.  Cardiovascular: Irregular rhythm. Normal rate. No murmur, rub or gallop.  Respiratory: Clear to auscultation bilaterally. No wheezes, rales or rhonchi.   Discharge Orders    Future Orders Please Complete By Expires   Diet general      Increase activity slowly      Discharge wound care:      Comments:   Continue wound VAC. Contact Dr. Durene Romans, M.D. (orthopedics) for specific wound care instructions.   Call MD for:  temperature >100.4      Call MD for:  severe uncontrolled pain      Call MD for:  redness, tenderness, or signs of infection (pain, swelling, redness, odor or green/yellow discharge around incision site)  Current Discharge Medication List    START taking these medications   Details   amiodarone (PACERONE) 200 MG tablet Take 1 tablet (200 mg total) by mouth daily.    cyclobenzaprine (FLEXERIL) 10 MG tablet Take 1 tablet (10 mg total) by mouth 3 (three) times daily as needed for muscle spasms (Hold for sedation). Qty: 30 tablet, Refills: 0    digoxin (LANOXIN) 0.125 MG tablet Take 1 tablet (0.125 mg total) by mouth daily.    ferrous sulfate 325 (65 FE) MG tablet Take 1 tablet (325 mg total) by mouth 3 (three) times daily after meals.    HYDROcodone-acetaminophen (NORCO) 7.5-325 MG per tablet Take 1-2 tablets by mouth every 6 (six) hours as needed for pain. Qty: 30 tablet, Refills: 0    midodrine (PROAMATINE) 5 MG tablet Take 1 tablet (5 mg total) by mouth 3 (three) times daily with meals.    Nutritional Supplements (JUVEN) PACK Take 1 packet by mouth 2 (two) times daily.    omeprazole (PRILOSEC) 40 MG capsule Take 1 capsule (40 mg total) by mouth daily.    polyethylene glycol (MIRALAX / GLYCOLAX) packet Take 17 g by mouth daily as needed (Constipation). Qty: 14 each    senna-docusate (SENOKOT-S) 8.6-50 MG per tablet Take 1 tablet by mouth at bedtime as needed for constipation.    traZODone (DESYREL) 50 MG tablet Take 1 tablet (50 mg total) by mouth at bedtime as needed for sleep.      CONTINUE these medications which have CHANGED   Details  dabigatran (PRADAXA) 150 MG CAPS Take 1 capsule (150 mg total) by mouth every 12 (twelve) hours.      STOP taking these medications     HYDROcodone-acetaminophen (NORCO) 10-325 MG per tablet      ethacrynic acid (EDECRIN) 25 MG tablet      spironolactone (ALDACTONE) 25 MG tablet        Follow-up Information    Follow up with KINLAW,JAMES B in 2 weeks.      Follow up with [facility] in 2 weeks.   Contact information:   [address] [place] [address] [phone]       Follow up with [name]. Make an appointment in 1 week.   Contact information:   [facility] [address] [place] [address] [phone]          Time coordinating discharge: 35 minutes.  The results of significant diagnostics from this hospitalization (including imaging, microbiology, ancillary and laboratory) are listed below for reference.    Significant Diagnostic Studies: Dg Chest 2 View  12/09/2010  *RADIOLOGY REPORT*  Clinical Data: Cough, congestion, occasional shortness of breath, evaluate for pneumonia  CHEST - 2 VIEW  Comparison: 04/24/2010  Findings: Unchanged cardiac silhouette and mediastinal contours with mild tortuosity of the thoracic aorta.  No focal parenchymal opacities.  No pleural effusion or pneumothorax.  Old deformity of the posterior lateral aspect of the left fourth and fifth ribs.  IMPRESSION: No acute cardiopulmonary disease.  Specifically, no evidence of pneumonia.  Original Report Authenticated By: Waynard Reeds, M.D.   Dg Chest Portable 1 View  12/14/2010  *RADIOLOGY REPORT*  Clinical Data: Shortness of breath, cough, congestion, former smoker  PORTABLE CHEST - 1 VIEW  Comparison: Chest x-ray of 12/09/2010  Findings:  The lungs are clear and slightly hyperaerated. Mediastinal contours are stable.  The heart is within upper limits of normal in stable.  No bony abnormality is seen.  IMPRESSION: No active lung disease.  No change in hyperaeration.  Original Report Authenticated By: Juline Patch, M.D.   Dg Knee Complete 4 Views Right  12/14/2010  *RADIOLOGY REPORT*  Clinical Data: Recent knee replacement, now with pain and swelling.  RIGHT KNEE - COMPLETE 4+ VIEW  Comparison: 11/23/1997 report  Findings: Status post left knee arthroplasty without overt evidence for hardware complication. No acute osseous abnormality. Skin staples project anteriorly.  There is underlying subcutaneous gas which is nonspecific in the recently postoperative state.  IMPRESSION: Status post right knee arthroplasty.  Subcutaneous  gas anteriorly is nonspecific in the recently postoperative state, however infection not excluded.  Original Report Authenticated By: Waneta Martins, M.D.    Microbiology:   Labs: Lab Results  Component Value Date   WBC 4.9 12/25/2010   HGB 9.5* 12/25/2010   HCT 27.4* 12/25/2010   MCV 102.6* 12/25/2010   PLT 386 12/25/2010   Lab Results  Component Value Date   CREATININE 0.72 12/25/2010   BUN 15 12/25/2010   NA 133* 12/25/2010   K 3.8 12/25/2010   CL 96 12/25/2010   CO2 31 12/25/2010   Culture, blood (routine x 2) [[id number]] Collection: 12/14/10 0315 Order Status: Completed Resulted: 12/20/10 1020 Specimen Type: Blood Specimen Source: BLOOD Specimen Description Result: BLOOD LEFT ARM Special Requests Result: BOTTLES DRAWN AEROBIC AND ANAEROBIC 5CC Setup Time [id number] Culture Result: NO GROWTH 5 DAYS Report Status Result: 12/20/2010 FINAL  Wound culture [[id number]] Collection: 12/16/10 1608 Order Status: Completed Resulted: 12/18/10 1404 Specimen Type: Wound Specimen Source: Leg Specimen Description LEG Special Requests Normal Gram Stain - Result: MODERATE WBC PRESENT, PREDOMINANTLY PMN NO SQUAMOUS EPITHELIAL CELLS SEEN NO ORGANISMS SEEN Culture Result: FEW STAPHYLOCOCCUS SPECIES (COAGULASE NEGATIVE) Report Status Result: 12/18/2010 FINAL   Urine culture [[id number]] Collection: 12/14/10 0332 Order Status: Completed Resulted: 12/16/10 0053 Specimen Type: Urine Specimen Source: Urine, Catheterized Specimen Description Result: URINE, CATHETERIZED Special Requests NONE Setup Time [id number] [place]ony Count Result: >=100,000 COLONIES/ML Culture Result: ESCHERICHIA COLI Report Status Result: 12/16/2010 FINAL Organism ID, Bacteria Result: ESCHERICHIA COLI   Signed: Pandora Leiter MD 12/28/2010, 9:51 AM

## 2010-12-28 NOTE — Progress Notes (Signed)
Patient alert and oriented, C/O left shoulder pain, hydrocodone given prior to transfer. Discoloration on left LE, right AKA surgical incision, dressing intact with wound VAC in place. Reported off to 6th floor RN.

## 2010-12-28 NOTE — Progress Notes (Signed)
Pt was d/c to [facility] today. CSW assisted with d/c planning to SNF.

## 2011-01-06 ENCOUNTER — Ambulatory Visit (HOSPITAL_COMMUNITY): Payer: Medicaid - Dental | Admitting: Dentistry

## 2011-01-06 ENCOUNTER — Encounter (HOSPITAL_COMMUNITY): Payer: Self-pay | Admitting: Dentistry

## 2011-01-06 VITALS — BP 109/60 | HR 64 | Temp 97.1°F

## 2011-01-06 DIAGNOSIS — K1370 Unspecified lesions of oral mucosa: Secondary | ICD-10-CM

## 2011-01-06 DIAGNOSIS — K08109 Complete loss of teeth, unspecified cause, unspecified class: Secondary | ICD-10-CM

## 2011-01-06 DIAGNOSIS — Z463 Encounter for fitting and adjustment of dental prosthetic device: Secondary | ICD-10-CM

## 2011-01-06 NOTE — Progress Notes (Signed)
  01/06/2011  BP: 109/60             P: 64          T: 97.1  [name] is a 65 year old female who presents for evaluation for upper and lower complete denture adjustment. Upper lower complete dentures were inserted while she was an inpatient. During that hospitalization, the patient had an above-knee amputation of the right knee. Patient now presents for denture adjustment and evaluation since being discharged. Subjective: Patient is complaining of some irritation to the lower left alveolar ridge. Exam: There is no signs of irritation or erythema. Patient is edentulous. The tissues looked healthy. There is no evidence of exposed bone. Procedure: PIP applied to upper and lower complete dentures. Adjustments made as needed. [place]. Occlusion evaluated and adjustments made as needed for centric relation and protrusive strokes. Pt. accepts results. Pt. to keep F/F out if sore spots develop. Use salt water rinses as needed to aid healing. Return to clinic as scheduled for denture adjustment. Call if problems arise before then. Pt. dismissed in stable condition. Dr. Kristin Bruins

## 2011-01-21 ENCOUNTER — Encounter (HOSPITAL_BASED_OUTPATIENT_CLINIC_OR_DEPARTMENT_OTHER): Payer: Medicare Other | Attending: Internal Medicine

## 2011-01-21 DIAGNOSIS — T85698A Other mechanical complication of other specified internal prosthetic devices, implants and grafts, initial encounter: Secondary | ICD-10-CM | POA: Insufficient documentation

## 2011-01-21 DIAGNOSIS — Y835 Amputation of limb(s) as the cause of abnormal reaction of the patient, or of later complication, without mention of misadventure at the time of the procedure: Secondary | ICD-10-CM | POA: Insufficient documentation

## 2011-01-21 DIAGNOSIS — I4892 Unspecified atrial flutter: Secondary | ICD-10-CM | POA: Insufficient documentation

## 2011-01-21 DIAGNOSIS — Z7902 Long term (current) use of antithrombotics/antiplatelets: Secondary | ICD-10-CM | POA: Insufficient documentation

## 2011-01-21 DIAGNOSIS — S78119A Complete traumatic amputation at level between unspecified hip and knee, initial encounter: Secondary | ICD-10-CM | POA: Insufficient documentation

## 2011-01-21 DIAGNOSIS — I509 Heart failure, unspecified: Secondary | ICD-10-CM | POA: Insufficient documentation

## 2011-01-21 DIAGNOSIS — Z96659 Presence of unspecified artificial knee joint: Secondary | ICD-10-CM | POA: Insufficient documentation

## 2011-01-21 NOTE — H&P (Signed)
NAMESOLASH, TULLO          ACCOUNT NO.:  [id number]  MEDICAL RECORD NO.:  [id number]  LOCATION:  FOOT                         FACILITY:  [facility]  PHYSICIAN:  Ardath Sax, M.D.     DATE OF BIRTH:  [birth year]  DATE OF ADMISSION:  01/21/2011 DATE OF DISCHARGE:                             HISTORY & PHYSICAL   [name] is a 65 year old lady who has had bilateral knee replacements and has had a very bad result on the right side.  It became infected and she has had multiple procedures to try to take care of this and they finally did an above-the-knee amputation because of an infected wound, and they treated her postoperatively with IV antibiotics and a wound VAC, and she is still using the wound VAC now.  She is reasonably healthy.  She has had a history of atrial fibrillation and she is on Plavix, but she has also had some congestive heart failure, and she has taken care of by Dr. Constance Haw who is a cardiologist.  She presently comes to the [facility] for evaluation of the wound from her right above-knee amputation site.  She has got an open wound that has some infected looking granulation tissue which I debrided, and I got it to be pretty clean.  I think, in the future, we can probably think about putting one of the biologics such as Oasis or Apligraf.  She would also be a good candidate because of this infected wound, is down to the bone, and she has had this open and treated with a wound VAC and IV antibiotics for several months without any help whatsoever, so I am going to put her in for HBO and biologic graft and to continue her antibiotics.     Ardath Sax, M.D.     PP/MEDQ  D:  01/21/2011  T:  01/21/2011  Job:  [id number]

## 2011-01-30 NOTE — Progress Notes (Signed)
[facility]  NAME:  [name], [name] NO.:  [id number]  MEDICAL RECORD NO.:  [id number]      DATE OF BIRTH:  1945-08-16  PHYSICIAN:  Ardath Sax, M.D.           VISIT DATE:                                  OFFICE VISIT   ADDENDUM  She is a lady who had the above-knee amputation of the left leg and she has an open wound, which really came from a failed flap.  After they did the amputation, they tried to create a flap to cover the tibia and this flap failed, and right now we are debriding the wound and we are using Santyl and collagen, and we feel that the hyperbaric oxygen would help heal this failed flap.     Ardath Sax, M.D.     PP/MEDQ  D:  01/29/2011  T:  01/30/2011  Job:  [id number]

## 2011-02-10 ENCOUNTER — Encounter (HOSPITAL_BASED_OUTPATIENT_CLINIC_OR_DEPARTMENT_OTHER): Payer: Medicare Other | Attending: General Surgery

## 2011-02-10 DIAGNOSIS — Y835 Amputation of limb(s) as the cause of abnormal reaction of the patient, or of later complication, without mention of misadventure at the time of the procedure: Secondary | ICD-10-CM | POA: Insufficient documentation

## 2011-02-10 DIAGNOSIS — T85698A Other mechanical complication of other specified internal prosthetic devices, implants and grafts, initial encounter: Secondary | ICD-10-CM | POA: Insufficient documentation

## 2011-02-10 DIAGNOSIS — S78119A Complete traumatic amputation at level between unspecified hip and knee, initial encounter: Secondary | ICD-10-CM | POA: Insufficient documentation

## 2011-02-18 ENCOUNTER — Encounter (HOSPITAL_BASED_OUTPATIENT_CLINIC_OR_DEPARTMENT_OTHER): Payer: Medicare Other

## 2011-02-24 LAB — GLUCOSE, CAPILLARY
Glucose-Capillary: 139 mg/dL — ABNORMAL HIGH (ref 70–99)
Glucose-Capillary: 168 mg/dL — ABNORMAL HIGH (ref 70–99)

## 2011-03-12 ENCOUNTER — Encounter (HOSPITAL_BASED_OUTPATIENT_CLINIC_OR_DEPARTMENT_OTHER): Payer: Medicare Other | Attending: General Surgery

## 2011-03-12 DIAGNOSIS — T85698A Other mechanical complication of other specified internal prosthetic devices, implants and grafts, initial encounter: Secondary | ICD-10-CM | POA: Diagnosis present

## 2011-03-12 DIAGNOSIS — Y835 Amputation of limb(s) as the cause of abnormal reaction of the patient, or of later complication, without mention of misadventure at the time of the procedure: Secondary | ICD-10-CM | POA: Insufficient documentation

## 2011-03-12 DIAGNOSIS — Z7902 Long term (current) use of antithrombotics/antiplatelets: Secondary | ICD-10-CM | POA: Diagnosis not present

## 2011-03-12 DIAGNOSIS — I509 Heart failure, unspecified: Secondary | ICD-10-CM | POA: Diagnosis not present

## 2011-03-12 DIAGNOSIS — Z96659 Presence of unspecified artificial knee joint: Secondary | ICD-10-CM | POA: Diagnosis not present

## 2011-03-12 DIAGNOSIS — S78119A Complete traumatic amputation at level between unspecified hip and knee, initial encounter: Secondary | ICD-10-CM | POA: Insufficient documentation

## 2011-03-12 DIAGNOSIS — I4892 Unspecified atrial flutter: Secondary | ICD-10-CM | POA: Insufficient documentation

## 2011-03-22 DIAGNOSIS — I4892 Unspecified atrial flutter: Secondary | ICD-10-CM | POA: Diagnosis not present

## 2011-03-22 DIAGNOSIS — T85698A Other mechanical complication of other specified internal prosthetic devices, implants and grafts, initial encounter: Secondary | ICD-10-CM | POA: Diagnosis not present

## 2011-04-02 DIAGNOSIS — I4892 Unspecified atrial flutter: Secondary | ICD-10-CM | POA: Diagnosis not present

## 2011-04-02 DIAGNOSIS — T85698A Other mechanical complication of other specified internal prosthetic devices, implants and grafts, initial encounter: Secondary | ICD-10-CM | POA: Diagnosis not present

## 2011-04-09 ENCOUNTER — Encounter (HOSPITAL_BASED_OUTPATIENT_CLINIC_OR_DEPARTMENT_OTHER): Payer: Medicare Other

## 2011-04-12 ENCOUNTER — Encounter: Payer: Self-pay | Admitting: Vascular Surgery

## 2011-04-13 ENCOUNTER — Ambulatory Visit (INDEPENDENT_AMBULATORY_CARE_PROVIDER_SITE_OTHER): Payer: Medicare Other | Admitting: Vascular Surgery

## 2011-04-13 ENCOUNTER — Encounter: Payer: Self-pay | Admitting: Vascular Surgery

## 2011-04-13 VITALS — BP 142/66 | HR 80 | Resp 16 | Ht 71.0 in | Wt 170.0 lb

## 2011-04-13 DIAGNOSIS — R238 Other skin changes: Secondary | ICD-10-CM

## 2011-04-13 DIAGNOSIS — L819 Disorder of pigmentation, unspecified: Secondary | ICD-10-CM

## 2011-04-13 NOTE — Progress Notes (Signed)
Subjective:     Patient ID: [name], female   DOB: July 14, 1945, 66 y.o.   MRN: [id number]  HPI 66 year old female was referred by Dr. Charlann Boxer to evaluate circulation left leg. I previously saw the patient in November of 2012 to evaluate her right leg. She had infection in the right knee joint and required an above-knee dictation but did not have a circulation problem with normal ABIs. She also had good pulses in the left leg at that time. The patient complains of numbness in the left foot which is a chronic finding. She denies any breast pain or history of nonhealing ulcers. She does have some darkening of the skin. She has no history of DVT in the left leg.  Past Medical History  Diagnosis Date  . Emphysema   . Coronary artery disease   . Atrial fibrillation   . COPD (chronic obstructive pulmonary disease)   . History of atrial fibrillation     S/pP Ablation  . Asthma   . Chronic kidney disease     HX OF UTI  . Arthritis   . Osteoarthritis   . Rheumatoid arthritis   . Anemia   . B12 deficiency anemia   . Stroke     slight stroke in 2009-no deficits  . Neuromuscular disorder     hx of right quadricep rupture  . DJD (degenerative joint disease)   . History of benign bladder tumor   . DVT (deep venous thrombosis)     History  Substance Use Topics  . Smoking status: Former Smoker -- 1.5 packs/day for 12 years    Types: Cigarettes    Quit date: 02/09/1996  . Smokeless tobacco: Never Used  . Alcohol Use: No    Family History  Problem Relation Age of Onset  . Achondroplasia Father   . Congenital heart disease Mother   . Hypertension Mother   . Hypertension Sister     Allergies  Allergen Reactions  . Sulfa Antibiotics Shortness Of Breath  . Avelox (Moxifloxacin Hcl In Nacl)   . Penicillins     Current outpatient prescriptions:amiodarone (PACERONE) 200 MG tablet, Take 1 tablet (200 mg total) by mouth daily., Disp: , Rfl: ;  dabigatran (PRADAXA) 150 MG CAPS, Take  1 capsule (150 mg total) by mouth every 12 (twelve) hours., Disp: , Rfl: ;  doxycycline (DORYX) 100 MG DR capsule, Take 100 mg by mouth 2 (two) times daily., Disp: , Rfl:  ferrous sulfate 325 (65 FE) MG tablet, Take 1 tablet (325 mg total) by mouth 3 (three) times daily after meals., Disp: , Rfl: ;  gabapentin (NEURONTIN) 300 MG capsule, Take 300 mg by mouth 3 (three) times daily., Disp: , Rfl: ;  montelukast (SINGULAIR) 10 MG tablet, Take 10 mg by mouth at bedtime., Disp: , Rfl: ;  omeprazole (PRILOSEC) 40 MG capsule, Take 1 capsule (40 mg total) by mouth daily., Disp: , Rfl:  traZODone (DESYREL) 50 MG tablet, Take 50 mg by mouth at bedtime., Disp: , Rfl: ;  digoxin (LANOXIN) 0.125 MG tablet, Take 1 tablet (0.125 mg total) by mouth daily., Disp: , Rfl: ;  Nutritional Supplements (JUVEN) PACK, Take 1 packet by mouth 2 (two) times daily., Disp: , Rfl: ;  senna-docusate (SENOKOT-S) 8.6-50 MG per tablet, Take 1 tablet by mouth at bedtime as needed for constipation., Disp: , Rfl:   BP 142/66  Pulse 80  Resp 16  Ht 5\' 11"  (1.803 m)  Wt 170 lb (77.111 kg)  BMI 23.71  kg/m2  SpO2 100%  Body mass index is 23.71 kg/(m^2).         Review of Systems denies chest pain, dyspnea on exertion, PND, orthopnea. Does need further shortening of her right above-knee amputation for proper prosthetic fitting. Does not complain of any other symptoms at this time although does have a history of atrial fibrillation with previous ablation procedure.    Objective:   Physical Exam pressure 140/66 heart rate 80 respirations 16 General elderly female no apparent stress alert and oriented x3 has a dressing over her right above-knee amputation stump HEENT normal for age Lungs no rhonchi or wheezing Heart avascular regular rhythm no murmurs carotid pulses 3+ normal bruits Abdomen soft nontender no masses Neurologic normal except decreased sensation left foot Left lower extremity has 3+ femoral popliteal dorsalis pedis  pulse palpable and well perfused left foot. There is no active ulcerations noted. She does have hyperpigmentation of the skin from the midcalf distally but no edema. No varicosities are noted.    Assessment:     No arterial insufficiency left leg. May have history of chronic venous insufficiency but no history of DVT or stasis ulcers or    Plan:       no further vascular evaluation indicated. No evidence of ischemia left leg. Return to see [place] on when necessary basis

## 2011-04-15 ENCOUNTER — Encounter (HOSPITAL_COMMUNITY): Payer: Self-pay | Admitting: Pharmacy Technician

## 2011-04-19 ENCOUNTER — Encounter (HOSPITAL_COMMUNITY): Payer: Self-pay

## 2011-04-19 ENCOUNTER — Encounter (HOSPITAL_COMMUNITY)
Admission: RE | Admit: 2011-04-19 | Discharge: 2011-04-19 | Disposition: A | Discharge status: Home / Self Care | Payer: Medicare Other | Source: Ambulatory Visit | Attending: Orthopedic Surgery | Admitting: Orthopedic Surgery

## 2011-04-19 LAB — CBC
MCH: 36.2 pg — ABNORMAL HIGH (ref 26.0–34.0)
MCHC: 35 g/dL (ref 30.0–36.0)
Platelets: 246 10*3/uL (ref 150–400)

## 2011-04-19 LAB — PROTIME-INR
INR: 0.91 (ref 0.00–1.49)
Prothrombin Time: 12.4 seconds (ref 11.6–15.2)

## 2011-04-19 LAB — BASIC METABOLIC PANEL
BUN: 10 mg/dL (ref 6–23)
Calcium: 9.3 mg/dL (ref 8.4–10.5)
GFR calc non Af Amer: >90 mL/min (ref >90)
Glucose, Bld: 108 mg/dL — ABNORMAL HIGH (ref 70–99)
Sodium: 134 mEq/L — ABNORMAL LOW (ref 135–145)

## 2011-04-19 LAB — DIFFERENTIAL
Basophils Absolute: 0 10*3/uL (ref 0.0–0.1)
Basophils Relative: 1 % (ref 0–1)
Eosinophils Absolute: 0.1 10*3/uL (ref 0.0–0.7)
Neutrophils Relative %: 50 % (ref 43–77)

## 2011-04-19 LAB — URINALYSIS, ROUTINE W REFLEX MICROSCOPIC
Bilirubin Urine: NEGATIVE
Ketones, ur: NEGATIVE mg/dL
Nitrite: NEGATIVE
Urobilinogen, UA: 0.2 mg/dL (ref 0.0–1.0)
pH: 6 (ref 5.0–8.0)

## 2011-04-19 MED ORDER — CHLORHEXIDINE GLUCONATE 4 % EX LIQD
60.0000 mL | Freq: Once | CUTANEOUS | Status: DC
  Filled 2011-04-19: qty 60

## 2011-04-19 NOTE — Patient Instructions (Signed)
20 OTISHA SPICKLER  04/19/2011   Your procedure is scheduled on:  04/26/11 400pm-530pm  Report to [facility] at 130pm  Call this number if you have problems the morning of surgery: [phone]   Remember:   Do not eat food:After Midnight.  May have clear liquids:until Midnight . Marland Kitchen  Take these medicines the morning of surgery with A SIP OF WATER:    Do not wear jewelry, make-up or nail polish.  Do not wear lotions, powders, or perfumes.   Do not shave 48 hours prior to surgery.  Do not bring valuables to the hospital.  Contacts, dentures or bridgework may not be worn into surgery.        Special Instructions: CHG Shower Use Special Wash: 1/2 bottle night before surgery and 1/2 bottle morning of surgery. shower chin to toes with CHG.  Wash face and private parts with regular soap.     Please read over the following fact sheets that you were given: MRSA Information, coughing and deep breathing exercises, leg exercises, Blood Transfusion Fact Sheet, incentive Spirometry Fact Sheet

## 2011-04-19 NOTE — Pre-Procedure Instructions (Signed)
01/18/11 LOV with cardiologist Dr Constance Haw on chart  EKG 12/14/10 EPIC  CXR 12/09/10 EPIC

## 2011-04-21 ENCOUNTER — Ambulatory Visit (HOSPITAL_COMMUNITY): Payer: Self-pay | Admitting: Dentistry

## 2011-04-21 VITALS — BP 130/61 | HR 81 | Temp 99.3°F

## 2011-04-21 DIAGNOSIS — Z463 Encounter for fitting and adjustment of dental prosthetic device: Secondary | ICD-10-CM

## 2011-04-21 DIAGNOSIS — K1370 Unspecified lesions of oral mucosa: Secondary | ICD-10-CM

## 2011-04-21 DIAGNOSIS — K08109 Complete loss of teeth, unspecified cause, unspecified class: Secondary | ICD-10-CM

## 2011-04-21 DIAGNOSIS — K0820 Unspecified atrophy of edentulous alveolar ridge: Secondary | ICD-10-CM

## 2011-04-21 NOTE — H&P (Signed)
[name] is an 66 y.o. female.    Chief Complaint:  Right leg amputation stump complication  HPI: Pt is a 66 y.o. female complaining of right amputation stump complications. Pt has had many previous surgeries on her right knee, including the original total,  later revision, infection of the knee with removal of the prosthesis and reimplantation of the prosthesis. Her last visit to the hospital started with a repair of the quad tendon, on 12/09/10. While in the hospital she had several complications including the blisters the rapidly filled with pus on the right knee. 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 Right after surgery she had a wound vac placed to help remove the edema of the right stump.  Pt presents to the clinic most recently complaining of an area that won't close up, that the distal femur feels a little long and that some of the nerve pain is bothering her a lot. Various options are discussed with the patient. Risks, benefits and expectations were discussed with the patient. Patient understand the risks, benefits and expectations and wishes to proceed with surgery to revise the right amputation stump.  PCP:  Marylen Ponto, MD, MD  D/C Plans:  Home with HHPT  Post-op Meds:  No Rx given   Tranexamic Acid:  Not to be given  Decadron:  Not to be given  PMH: Past Medical History  Diagnosis Date  . Emphysema   . Coronary artery disease   . Atrial fibrillation   . COPD (chronic obstructive pulmonary disease)   . History of atrial fibrillation     S/pP Ablation  . Asthma   . Chronic kidney disease     HX OF UTI  . Arthritis   . Osteoarthritis   . Rheumatoid arthritis   . Anemia   . B12 deficiency anemia   . Stroke     slight stroke in 2009-no deficits  . Neuromuscular disorder     hx of right quadricep rupture  . DJD (degenerative joint disease)   . History of benign bladder tumor   . DVT (deep venous thrombosis)   . Blood transfusion     2009      PSH: Past Surgical History  Procedure Date  . Replacement total knee   . Cardiac electrophysiology mapping and ablation   . Cholecystectomy 1996  . Cataract extraction, bilateral   . Hematoma evacuation 2009    From right groin  . Joint replacement     Left-1996, Right-1995  . Multiple tooth extractions 06/30/2010  . Trigger finger release 2011  . Amputation 12/17/2010    Procedure: AMPUTATION ABOVE KNEE;  Surgeon: [name];  Location: [facility];  Service: Orthopedics;  Laterality: Right;  . Application of wound vac 12/17/2010    Procedure: APPLICATION OF WOUND VAC;  Surgeon: [name];  Location: [facility];  Service: Orthopedics;  Laterality: Right;  wound Vac applied @ 2045 By Luna Fuse    Social History:  reports that she quit smoking about 15 years ago. Her smoking use included Cigarettes. She has a 18 pack-year smoking history. She has never used smokeless tobacco. She reports that she does not drink alcohol or use illicit drugs.  Allergies:  Allergies  Allergen Reactions  . Sulfa Antibiotics Anaphylaxis and Shortness Of Breath  . Avelox (Moxifloxacin Hcl In Nacl) Diarrhea and Nausea And Vomiting  . Penicillins Hives and Rash    Medications: No current facility-administered medications for this encounter.   Current Outpatient Prescriptions  Medication Sig Dispense Refill  . amiodarone (PACERONE) 200 MG tablet Take 200 mg by mouth daily. Takes in evening      . dabigatran (PRADAXA) 150 MG CAPS Take 1 capsule (150 mg total) by mouth every 12 (twelve) hours.      . digoxin (LANOXIN) 0.125 MG tablet Take 0.125 mg by mouth at bedtime.      Marland Kitchen doxycycline (DORYX) 100 MG DR capsule Take 100 mg by mouth 2 (two) times daily.      Marland Kitchen gabapentin (NEURONTIN) 300 MG capsule Take 300 mg by mouth 3 (three) times daily.      . methocarbamol (ROBAXIN) 500 MG tablet Take 500 mg by mouth 2 (two) times daily.      . montelukast (SINGULAIR) 10 MG tablet Take 10 mg by mouth at bedtime.       Marland Kitchen omeprazole (PRILOSEC) 40 MG capsule Take 40 mg by mouth daily. Takes in evening      . senna-docusate (SENOKOT-S) 8.6-50 MG per tablet Take 1 tablet by mouth at bedtime as needed for constipation.      . traMADol (ULTRAM) 50 MG tablet Take 50 mg by mouth every 6 (six) hours as needed. Pain       . traZODone (DESYREL) 50 MG tablet Take 50 mg by mouth at bedtime.         ROS: Review of Systems  Constitutional: Positive for weight loss. Negative for fever and chills.  HENT: Negative.   Respiratory: Negative.   Cardiovascular: Negative.  Negative for chest pain and leg swelling.  Gastrointestinal: Positive for heartburn.  Genitourinary: Negative.   Musculoskeletal: Positive for myalgias and joint pain.  Skin: Negative.   Neurological: Positive for weakness.  Endo/Heme/Allergies: Negative.   Psychiatric/Behavioral: The patient is nervous/anxious.      Physican Exam: BP: 122/68 ; HR: 74 ; Resp: 14 ; Physical Exam  Constitutional: She is oriented to person, place, and time and well-developed, well-nourished, and in no distress.  HENT:  Head: Normocephalic and atraumatic.  Nose: Nose normal.  Mouth/Throat: Oropharynx is clear and moist.  Eyes: Pupils are equal, round, and reactive to light.  Neck: Neck supple. No JVD present. No tracheal deviation present. No thyromegaly present.  Cardiovascular: Normal rate, regular rhythm and normal heart sounds.   Pulmonary/Chest: Effort normal and breath sounds normal. No stridor.  Abdominal: Soft. There is no tenderness. There is no guarding.  Musculoskeletal:       Right knee: She exhibits deformity, laceration and bony tenderness. She exhibits no swelling, no effusion, no ecchymosis and no erythema. tenderness found.       Legs: Lymphadenopathy:    She has no cervical adenopathy.  Neurological: She is alert and oriented to person, place, and time.  Skin: Skin is warm and dry.  Psychiatric: Affect normal.      Assessment/Plan Assessment:   Right leg amputation stump complication   Plan: Patient will undergo a right amputation stump revision on 04/27/2011 per Dr. Charlann Boxer at [facility]. Risks benefits and expectation were discussed with the patient. Patient understand risks, benefits and expectation and wishes to proceed.   Anastasio Auerbach Shivansh Hardaway   PAC  04/21/2011, 5:46 PM

## 2011-04-21 NOTE — Progress Notes (Signed)
Wednesday, April 21, 2011   BP:  130/61                 P:  81         T:  99.3  [name] is a 66 year old female who presents for evaluation for denture adjustment. Upper and lower complete dentures were inserted in November of 2012.  Patient Active Problem List  Diagnoses  . Atrial fibrillation with RVR  . Hypotension, unspecified  . Cellulitis and abscess of leg  . Insomnia  . Anemia  . Status post amputation of leg  . Discoloration of skin of lower leg   Allergies  Allergen Reactions  . Sulfa Antibiotics Anaphylaxis and Shortness Of Breath  . Avelox (Moxifloxacin Hcl In Nacl) Diarrhea and Nausea And Vomiting  . Penicillins Hives and Rash   Current Outpatient Prescriptions  Medication Sig Dispense Refill  . amiodarone (PACERONE) 200 MG tablet Take 200 mg by mouth daily. Takes in evening      . dabigatran (PRADAXA) 150 MG CAPS Take 1 capsule (150 mg total) by mouth every 12 (twelve) hours.      . digoxin (LANOXIN) 0.125 MG tablet Take 0.125 mg by mouth at bedtime.      Marland Kitchen doxycycline (DORYX) 100 MG DR capsule Take 100 mg by mouth 2 (two) times daily.      Marland Kitchen gabapentin (NEURONTIN) 300 MG capsule Take 300 mg by mouth 3 (three) times daily.      . methocarbamol (ROBAXIN) 500 MG tablet Take 500 mg by mouth 2 (two) times daily.      . montelukast (SINGULAIR) 10 MG tablet Take 10 mg by mouth at bedtime.      Marland Kitchen omeprazole (PRILOSEC) 40 MG capsule Take 40 mg by mouth daily. Takes in evening      . senna-docusate (SENOKOT-S) 8.6-50 MG per tablet Take 1 tablet by mouth at bedtime as needed for constipation.      . traMADol (ULTRAM) 50 MG tablet Take 50 mg by mouth every 6 (six) hours as needed. Pain       . traZODone (DESYREL) 50 MG tablet Take 50 mg by mouth at bedtime.       Subjective: Patient is complaining of some irritation to the lower anterior alveolar ridge. Exam: There is no signs of irritation or erythema. Patient is edentulous. The tissues looked healthy. There  is no evidence of exposed bone. Procedure: Pressure indicating paste was applied to upper and lower complete dentures. Adjustments made as needed. [place]. Occlusion evaluated and adjustments made as needed for centric relation and protrusive strokes. Patient accepts results. Patient to keep dentures out if sore spots develop. Use salt water rinses as needed to aid healing. Return to clinic as scheduled for denture adjustment. Call if problems arise before then. Patient dismissed in stable condition.   Dr. Cindra Eves

## 2011-04-27 ENCOUNTER — Ambulatory Visit (HOSPITAL_COMMUNITY): Payer: Medicare Other | Admitting: Certified Registered Nurse Anesthetist

## 2011-04-27 ENCOUNTER — Encounter (HOSPITAL_COMMUNITY)
Admission: RE | Disposition: A | Discharge status: Home Health | Payer: Self-pay | Source: Ambulatory Visit | Attending: Orthopedic Surgery

## 2011-04-27 ENCOUNTER — Inpatient Hospital Stay (HOSPITAL_COMMUNITY)
Admission: RE | Admit: 2011-04-27 | Discharge: 2011-04-30 | DRG: 476 | Disposition: A | Discharge status: Home Health | Payer: Medicare Other | Source: Ambulatory Visit | Attending: Orthopedic Surgery | Admitting: Orthopedic Surgery

## 2011-04-27 ENCOUNTER — Encounter (HOSPITAL_COMMUNITY): Payer: Self-pay | Admitting: Certified Registered Nurse Anesthetist

## 2011-04-27 ENCOUNTER — Encounter (HOSPITAL_COMMUNITY): Payer: Self-pay | Admitting: *Deleted

## 2011-04-27 DIAGNOSIS — F17200 Nicotine dependence, unspecified, uncomplicated: Secondary | ICD-10-CM | POA: Diagnosis present

## 2011-04-27 DIAGNOSIS — J438 Other emphysema: Secondary | ICD-10-CM | POA: Diagnosis present

## 2011-04-27 DIAGNOSIS — Y92009 Unspecified place in unspecified non-institutional (private) residence as the place of occurrence of the external cause: Secondary | ICD-10-CM

## 2011-04-27 DIAGNOSIS — D518 Other vitamin B12 deficiency anemias: Secondary | ICD-10-CM | POA: Diagnosis present

## 2011-04-27 DIAGNOSIS — Y835 Amputation of limb(s) as the cause of abnormal reaction of the patient, or of later complication, without mention of misadventure at the time of the procedure: Secondary | ICD-10-CM | POA: Diagnosis present

## 2011-04-27 DIAGNOSIS — I4891 Unspecified atrial fibrillation: Secondary | ICD-10-CM | POA: Diagnosis present

## 2011-04-27 DIAGNOSIS — I2510 Atherosclerotic heart disease of native coronary artery without angina pectoris: Secondary | ICD-10-CM | POA: Diagnosis present

## 2011-04-27 DIAGNOSIS — M069 Rheumatoid arthritis, unspecified: Secondary | ICD-10-CM | POA: Diagnosis present

## 2011-04-27 DIAGNOSIS — T8189XA Other complications of procedures, not elsewhere classified, initial encounter: Secondary | ICD-10-CM

## 2011-04-27 DIAGNOSIS — T8789 Other complications of amputation stump: Secondary | ICD-10-CM

## 2011-04-27 DIAGNOSIS — Z8673 Personal history of transient ischemic attack (TIA), and cerebral infarction without residual deficits: Secondary | ICD-10-CM

## 2011-04-27 HISTORY — PX: AMPUTATION: SHX166

## 2011-04-27 LAB — GLUCOSE, CAPILLARY: Glucose-Capillary: 92 mg/dL (ref 70–99)

## 2011-04-27 LAB — TYPE AND SCREEN: ABO/RH(D): A POS

## 2011-04-27 SURGERY — AMPUTATION, ABOVE KNEE
Anesthesia: General | Site: Knee | Laterality: Right | Wound class: Clean

## 2011-04-27 MED ORDER — FERROUS SULFATE 325 (65 FE) MG PO TABS
325.0000 mg | ORAL_TABLET | Freq: Three times a day (TID) | ORAL | Status: DC
  Administered 2011-04-27 – 2011-04-30 (×9): 325 mg via ORAL
  Filled 2011-04-27 (×10): qty 1

## 2011-04-27 MED ORDER — HYDROMORPHONE HCL PF 1 MG/ML IJ SOLN
INTRAMUSCULAR | Status: DC | PRN
  Administered 2011-04-27 (×2): 1 mg via INTRAVENOUS

## 2011-04-27 MED ORDER — CLINDAMYCIN PHOSPHATE 600 MG/50ML IV SOLN
600.0000 mg | INTRAVENOUS | Status: AC
  Administered 2011-04-27: 600 mg via INTRAVENOUS

## 2011-04-27 MED ORDER — METOCLOPRAMIDE HCL 10 MG PO TABS: 5.0000 mg | ORAL_TABLET | Freq: Three times a day (TID) | ORAL | Status: DC | PRN

## 2011-04-27 MED ORDER — PANTOPRAZOLE SODIUM 40 MG PO TBEC
80.0000 mg | DELAYED_RELEASE_TABLET | Freq: Every day | ORAL | Status: DC
  Administered 2011-04-27 – 2011-04-28 (×2): 80 mg via ORAL
  Filled 2011-04-27 (×3): qty 2

## 2011-04-27 MED ORDER — ONDANSETRON HCL 4 MG/2ML IJ SOLN
INTRAMUSCULAR | Status: DC | PRN
  Administered 2011-04-27: 4 mg via INTRAVENOUS

## 2011-04-27 MED ORDER — CLINDAMYCIN PHOSPHATE 600 MG/50ML IV SOLN
INTRAVENOUS | Status: AC
  Filled 2011-04-27: qty 50

## 2011-04-27 MED ORDER — TRAZODONE HCL 50 MG PO TABS
50.0000 mg | ORAL_TABLET | Freq: Every day | ORAL | Status: DC
  Administered 2011-04-27 – 2011-04-29 (×3): 50 mg via ORAL
  Filled 2011-04-27 (×4): qty 1

## 2011-04-27 MED ORDER — HYDROCODONE-ACETAMINOPHEN 7.5-325 MG PO TABS: 1.0000 | ORAL_TABLET | ORAL | Status: DC

## 2011-04-27 MED ORDER — PHENOL 1.4 % MT LIQD
1.0000 | OROMUCOSAL | Status: DC | PRN
  Administered 2011-04-30: 1 via OROMUCOSAL
  Filled 2011-04-27: qty 177

## 2011-04-27 MED ORDER — AMIODARONE HCL 200 MG PO TABS
200.0000 mg | ORAL_TABLET | Freq: Every day | ORAL | Status: DC
Start: 2011-04-27 — End: 2011-04-30
  Administered 2011-04-27 – 2011-04-30 (×4): 200 mg via ORAL
  Filled 2011-04-27 (×4): qty 1

## 2011-04-27 MED ORDER — AMIODARONE HCL 200 MG PO TABS
200.0000 mg | ORAL_TABLET | Freq: Every day | ORAL | Status: DC
  Filled 2011-04-27: qty 1

## 2011-04-27 MED ORDER — SODIUM CHLORIDE 0.9 % IR SOLN
Status: DC | PRN
  Administered 2011-04-27: 3000 mL

## 2011-04-27 MED ORDER — NEOSTIGMINE METHYLSULFATE 1 MG/ML IJ SOLN
INTRAMUSCULAR | Status: DC | PRN
  Administered 2011-04-27: 2.5 mg via INTRAVENOUS

## 2011-04-27 MED ORDER — DOCUSATE SODIUM 100 MG PO CAPS
100.0000 mg | ORAL_CAPSULE | Freq: Two times a day (BID) | ORAL | Status: DC
  Administered 2011-04-27 – 2011-04-30 (×6): 100 mg via ORAL
  Filled 2011-04-27 (×7): qty 1

## 2011-04-27 MED ORDER — HYDROMORPHONE HCL PF 1 MG/ML IJ SOLN
INTRAMUSCULAR | Status: AC
  Filled 2011-04-27: qty 1

## 2011-04-27 MED ORDER — SUCCINYLCHOLINE CHLORIDE 20 MG/ML IJ SOLN
INTRAMUSCULAR | Status: DC | PRN
  Administered 2011-04-27: 100 mg via INTRAVENOUS

## 2011-04-27 MED ORDER — DIPHENHYDRAMINE HCL 50 MG/ML IJ SOLN
INTRAMUSCULAR | Status: AC
  Administered 2011-04-27: 12.5 mg
  Filled 2011-04-27: qty 1

## 2011-04-27 MED ORDER — POLYETHYLENE GLYCOL 3350 17 G PO PACK
17.0000 g | PACK | Freq: Every day | ORAL | Status: DC | PRN
  Filled 2011-04-27: qty 1

## 2011-04-27 MED ORDER — CLINDAMYCIN PHOSPHATE 600 MG/50ML IV SOLN
600.0000 mg | Freq: Four times a day (QID) | INTRAVENOUS | Status: AC
  Administered 2011-04-27 – 2011-04-28 (×3): 600 mg via INTRAVENOUS
  Filled 2011-04-27 (×4): qty 50

## 2011-04-27 MED ORDER — CISATRACURIUM BESYLATE 2 MG/ML IV SOLN
INTRAVENOUS | Status: DC | PRN
  Administered 2011-04-27: 2 mg via INTRAVENOUS
  Administered 2011-04-27: 4 mg via INTRAVENOUS

## 2011-04-27 MED ORDER — HYDROCODONE-ACETAMINOPHEN 5-325 MG PO TABS
ORAL_TABLET | ORAL | Status: AC
  Filled 2011-04-27: qty 2

## 2011-04-27 MED ORDER — DIGOXIN 125 MCG PO TABS
0.1250 mg | ORAL_TABLET | Freq: Every day | ORAL | Status: DC
  Administered 2011-04-27 – 2011-04-29 (×3): 0.125 mg via ORAL
  Filled 2011-04-27 (×4): qty 1

## 2011-04-27 MED ORDER — FLEET ENEMA 7-19 GM/118ML RE ENEM: 1.0000 | ENEMA | Freq: Once | RECTAL | Status: AC | PRN

## 2011-04-27 MED ORDER — HYDROCODONE-ACETAMINOPHEN 7.5-325 MG PO TABS
ORAL_TABLET | ORAL | Status: AC
  Filled 2011-04-27: qty 2

## 2011-04-27 MED ORDER — SODIUM CHLORIDE 0.9 % IV SOLN
INTRAVENOUS | Status: DC
  Administered 2011-04-27 – 2011-04-29 (×2): via INTRAVENOUS
  Filled 2011-04-27 (×10): qty 1000

## 2011-04-27 MED ORDER — MENTHOL 3 MG MT LOZG
1.0000 | LOZENGE | OROMUCOSAL | Status: DC | PRN
  Filled 2011-04-27: qty 9

## 2011-04-27 MED ORDER — BISACODYL 5 MG PO TBEC: 5.0000 mg | DELAYED_RELEASE_TABLET | Freq: Every day | ORAL | Status: DC | PRN

## 2011-04-27 MED ORDER — LACTATED RINGERS IV SOLN
INTRAVENOUS | Status: DC | PRN
  Administered 2011-04-27 (×2): via INTRAVENOUS

## 2011-04-27 MED ORDER — MIDAZOLAM HCL 5 MG/5ML IJ SOLN
INTRAMUSCULAR | Status: DC | PRN
  Administered 2011-04-27: 2 mg via INTRAVENOUS

## 2011-04-27 MED ORDER — HYDROMORPHONE HCL PF 1 MG/ML IJ SOLN
0.2500 mg | INTRAMUSCULAR | Status: DC | PRN
  Administered 2011-04-27 (×4): 0.5 mg via INTRAVENOUS

## 2011-04-27 MED ORDER — ONDANSETRON HCL 4 MG/2ML IJ SOLN: 4.0000 mg | Freq: Four times a day (QID) | INTRAMUSCULAR | Status: DC | PRN

## 2011-04-27 MED ORDER — PROPOFOL 10 MG/ML IV BOLUS
INTRAVENOUS | Status: DC | PRN
  Administered 2011-04-27: 100 mg via INTRAVENOUS
  Administered 2011-04-27: 150 mg via INTRAVENOUS
  Administered 2011-04-27: 50 mg via INTRAVENOUS

## 2011-04-27 MED ORDER — DIPHENHYDRAMINE HCL 25 MG PO CAPS
25.0000 mg | ORAL_CAPSULE | Freq: Four times a day (QID) | ORAL | Status: DC | PRN
  Administered 2011-04-28: 25 mg via ORAL
  Filled 2011-04-27: qty 1

## 2011-04-27 MED ORDER — ACETAMINOPHEN 10 MG/ML IV SOLN
INTRAVENOUS | Status: DC | PRN
  Administered 2011-04-27: 1000 mg via INTRAVENOUS

## 2011-04-27 MED ORDER — FENTANYL CITRATE 0.05 MG/ML IJ SOLN
INTRAMUSCULAR | Status: DC | PRN
  Administered 2011-04-27: 50 ug via INTRAVENOUS
  Administered 2011-04-27 (×2): 100 ug via INTRAVENOUS

## 2011-04-27 MED ORDER — HYDROCODONE-ACETAMINOPHEN 5-325 MG PO TABS: 1.5000 | ORAL_TABLET | ORAL | Status: DC

## 2011-04-27 MED ORDER — HYDROMORPHONE HCL PF 1 MG/ML IJ SOLN
0.5000 mg | INTRAMUSCULAR | Status: DC | PRN
  Administered 2011-04-27 – 2011-04-28 (×2): 0.5 mg via INTRAVENOUS
  Administered 2011-04-28 – 2011-04-29 (×2): 1 mg via INTRAVENOUS
  Filled 2011-04-27 (×5): qty 1

## 2011-04-27 MED ORDER — GABAPENTIN 300 MG PO CAPS
300.0000 mg | ORAL_CAPSULE | Freq: Three times a day (TID) | ORAL | Status: DC
  Administered 2011-04-27 – 2011-04-30 (×8): 300 mg via ORAL
  Filled 2011-04-27 (×10): qty 1

## 2011-04-27 MED ORDER — 0.9 % SODIUM CHLORIDE (POUR BTL) OPTIME
TOPICAL | Status: DC | PRN
  Administered 2011-04-27: 1000 mL

## 2011-04-27 MED ORDER — AMIODARONE HCL 100 MG PO TABS
ORAL_TABLET | ORAL | Status: DC | PRN
  Administered 2011-04-27: 200 mg via ORAL

## 2011-04-27 MED ORDER — LACTATED RINGERS IV SOLN: INTRAVENOUS | Status: DC

## 2011-04-27 MED ORDER — DABIGATRAN ETEXILATE MESYLATE 150 MG PO CAPS
150.0000 mg | ORAL_CAPSULE | Freq: Two times a day (BID) | ORAL | Status: DC
  Administered 2011-04-28 – 2011-04-30 (×5): 150 mg via ORAL
  Filled 2011-04-27 (×6): qty 1

## 2011-04-27 MED ORDER — ACETAMINOPHEN 10 MG/ML IV SOLN
INTRAVENOUS | Status: AC
  Filled 2011-04-27: qty 100

## 2011-04-27 MED ORDER — ASPIRIN 325 MG PO TABS
325.0000 mg | ORAL_TABLET | Freq: Every day | ORAL | Status: DC
  Administered 2011-04-28 – 2011-04-30 (×3): 325 mg via ORAL
  Filled 2011-04-27 (×3): qty 1

## 2011-04-27 MED ORDER — METHOCARBAMOL 100 MG/ML IJ SOLN
500.0000 mg | Freq: Four times a day (QID) | INTRAVENOUS | Status: DC | PRN
  Administered 2011-04-27: 500 mg via INTRAVENOUS
  Filled 2011-04-27: qty 5

## 2011-04-27 MED ORDER — METOCLOPRAMIDE HCL 5 MG/ML IJ SOLN: 5.0000 mg | Freq: Three times a day (TID) | INTRAMUSCULAR | Status: DC | PRN

## 2011-04-27 MED ORDER — LIDOCAINE HCL 1 % IJ SOLN
INTRAMUSCULAR | Status: DC | PRN
  Administered 2011-04-27: 60 mg via INTRADERMAL

## 2011-04-27 MED ORDER — MONTELUKAST SODIUM 10 MG PO TABS
10.0000 mg | ORAL_TABLET | Freq: Every day | ORAL | Status: DC
  Administered 2011-04-27 – 2011-04-29 (×3): 10 mg via ORAL
  Filled 2011-04-27 (×4): qty 1

## 2011-04-27 MED ORDER — METHOCARBAMOL 500 MG PO TABS
500.0000 mg | ORAL_TABLET | Freq: Four times a day (QID) | ORAL | Status: DC | PRN
  Administered 2011-04-29: 500 mg via ORAL
  Filled 2011-04-27 (×2): qty 1

## 2011-04-27 MED ORDER — GLYCOPYRROLATE 0.2 MG/ML IJ SOLN
INTRAMUSCULAR | Status: DC | PRN
  Administered 2011-04-27: .5 mg via INTRAVENOUS

## 2011-04-27 MED ORDER — HYDROCODONE-ACETAMINOPHEN 7.5-325 MG PO TABS
1.0000 | ORAL_TABLET | ORAL | Status: DC
  Administered 2011-04-27: 2 via ORAL
  Administered 2011-04-27 – 2011-04-28 (×3): 1 via ORAL
  Administered 2011-04-28: 2 via ORAL
  Administered 2011-04-28 (×2): 1 via ORAL
  Administered 2011-04-28: 2 via ORAL
  Administered 2011-04-29: 1 via ORAL
  Filled 2011-04-27 (×3): qty 2
  Filled 2011-04-27: qty 1
  Filled 2011-04-27: qty 2
  Filled 2011-04-27: qty 1
  Filled 2011-04-27: qty 2
  Filled 2011-04-27: qty 1

## 2011-04-27 MED ORDER — ALUM & MAG HYDROXIDE-SIMETH 200-200-20 MG/5ML PO SUSP: 30.0000 mL | ORAL | Status: DC | PRN

## 2011-04-27 MED ORDER — ACETAMINOPHEN 325 MG PO TABS
650.0000 mg | ORAL_TABLET | Freq: Four times a day (QID) | ORAL | Status: DC | PRN
  Administered 2011-04-29: 650 mg via ORAL
  Filled 2011-04-27: qty 2

## 2011-04-27 MED ORDER — ONDANSETRON HCL 4 MG PO TABS: 4.0000 mg | ORAL_TABLET | Freq: Four times a day (QID) | ORAL | Status: DC | PRN

## 2011-04-27 MED ORDER — ACETAMINOPHEN 650 MG RE SUPP: 650.0000 mg | Freq: Four times a day (QID) | RECTAL | Status: DC | PRN

## 2011-04-27 SURGICAL SUPPLY — 43 items
BAG ZIPLOCK 12X15 (MISCELLANEOUS) IMPLANT
BLADE SAW SAG 29X58X.64 (BLADE) ×2 IMPLANT
BLADE SURG SZ10 CARB STEEL (BLADE) ×2 IMPLANT
CLOTH BEACON ORANGE TIMEOUT ST (SAFETY) ×2 IMPLANT
CUFF TOURN SGL QUICK 18 (TOURNIQUET CUFF) IMPLANT
DRAPE SURG 17X11 SM STRL (DRAPES) IMPLANT
DRAPE U-SHAPE 47X51 STRL (DRAPES) ×2 IMPLANT
DRESSING DUODERM 4X4 STERILE (GAUZE/BANDAGES/DRESSINGS) ×2 IMPLANT
DURAPREP 26ML APPLICATOR (WOUND CARE) ×2 IMPLANT
ELECT REM PT RETURN 9FT ADLT (ELECTROSURGICAL) ×2
ELECTRODE REM PT RTRN 9FT ADLT (ELECTROSURGICAL) ×1 IMPLANT
GAUZE XEROFORM 4X4 STRL (GAUZE/BANDAGES/DRESSINGS) ×2 IMPLANT
GLOVE BIOGEL PI IND STRL 7.5 (GLOVE) IMPLANT
GLOVE BIOGEL PI IND STRL 8 (GLOVE) IMPLANT
GLOVE BIOGEL PI INDICATOR 7.5 (GLOVE)
GLOVE BIOGEL PI INDICATOR 8 (GLOVE)
GLOVE ECLIPSE 8.0 STRL XLNG CF (GLOVE) ×2 IMPLANT
GLOVE ECLIPSE 8.5 STRL (GLOVE) ×2 IMPLANT
GLOVE ORTHO TXT STRL SZ7.5 (GLOVE) ×4 IMPLANT
GLOVE SURG ORTHO 8.0 STRL STRW (GLOVE) ×2 IMPLANT
GOWN BRE IMP PREV XXLGXLNG (GOWN DISPOSABLE) ×2 IMPLANT
GOWN STRL NON-REIN LRG LVL3 (GOWN DISPOSABLE) ×2 IMPLANT
HANDPIECE INTERPULSE COAX TIP (DISPOSABLE) ×1
KIT BASIN OR (CUSTOM PROCEDURE TRAY) ×2 IMPLANT
MANIFOLD NEPTUNE II (INSTRUMENTS) IMPLANT
NS IRRIG 1000ML POUR BTL (IV SOLUTION) ×2 IMPLANT
PACK LOWER EXTREMITY WL (CUSTOM PROCEDURE TRAY) ×2 IMPLANT
PAD CAST 4YDX4 CTTN HI CHSV (CAST SUPPLIES) IMPLANT
PADDING CAST COTTON 4X4 STRL (CAST SUPPLIES)
POSITIONER SURGICAL ARM (MISCELLANEOUS) IMPLANT
SET HNDPC FAN SPRY TIP SCT (DISPOSABLE) ×1 IMPLANT
SOL PREP POV-IOD 16OZ 10% (MISCELLANEOUS) ×2 IMPLANT
SOL PREP PROV IODINE SCRUB 4OZ (MISCELLANEOUS) IMPLANT
SPONGE GAUZE 4X4 12PLY (GAUZE/BANDAGES/DRESSINGS) ×2 IMPLANT
SPONGE LAP 18X18 X RAY DECT (DISPOSABLE) ×4 IMPLANT
STAPLER VISISTAT 35W (STAPLE) ×2 IMPLANT
SUT PROLENE 3 0 PS 2 (SUTURE) IMPLANT
SUT VIC AB 1 CT1 36 (SUTURE) ×4 IMPLANT
SUT VIC AB 2-0 CT1 27 (SUTURE) ×2
SUT VIC AB 2-0 CT1 TAPERPNT 27 (SUTURE) ×2 IMPLANT
TOWEL OR 17X26 10 PK STRL BLUE (TOWEL DISPOSABLE) ×4 IMPLANT
TOWEL OR NON WOVEN STRL DISP B (DISPOSABLE) ×2 IMPLANT
WATER STERILE IRR 1500ML POUR (IV SOLUTION) IMPLANT

## 2011-04-27 NOTE — Anesthesia Procedure Notes (Signed)
Procedure Name: Intubation Date/Time: 04/27/2011 1:49 PM Performed by: Hulan Fess Pre-anesthesia Checklist: Patient identified, Emergency Drugs available, Suction available, Patient being monitored and Timeout performed Patient Re-evaluated:Patient Re-evaluated prior to inductionOxygen Delivery Method: Circle system utilized Preoxygenation: Pre-oxygenation with 100% oxygen Intubation Type: IV induction Ventilation: Mask ventilation without difficulty Laryngoscope Size: Mac and 3 Grade View: Grade I Tube type: Oral Tube size: 8.0 mm Number of attempts: 1 Placement Confirmation: ETT inserted through vocal cords under direct vision and breath sounds checked- equal and bilateral Secured at: 21 cm Tube secured with: Tape Dental Injury: Teeth and Oropharynx as per pre-operative assessment

## 2011-04-27 NOTE — Anesthesia Postprocedure Evaluation (Signed)
  Anesthesia Post-op Note  Patient: [name]  Procedure(s) Performed: Procedure(s) (LRB): AMPUTATION ABOVE KNEE (Right)  Patient Location: PACU  Anesthesia Type: General  Level of Consciousness: oriented and sedated  Airway and Oxygen Therapy: Patient Spontanous Breathing and Patient connected to nasal cannula oxygen  Post-op Pain: mild  Post-op Assessment: Post-op Vital signs reviewed, Patient's Cardiovascular Status Stable, Respiratory Function Stable and Patent Airway  Post-op Vital Signs: stable  Complications: No apparent anesthesia complications

## 2011-04-27 NOTE — Anesthesia Preprocedure Evaluation (Signed)
Anesthesia Evaluation  Patient identified by MRN, date of birth, ID band Patient awake    Reviewed: Allergy & Precautions, H&P , NPO status , Patient's Chart, lab work & pertinent test results, reviewed documented beta blocker date and time   Airway Mallampati: II TM Distance: >3 FB Neck ROM: Full    Dental  (+) Upper Dentures and Lower Dentures   Pulmonary COPD COPD inhaler, Current Smoker,  breath sounds clear to auscultation  + decreased breath sounds      Cardiovascular negative cardio ROS  + dysrhythmias Atrial Fibrillation Rhythm:Regular Rate:Normal  S/p ablation X3, no recurrence since last ablation   Neuro/Psych negative neurological ROS  negative psych ROS   GI/Hepatic negative GI ROS, Neg liver ROS,   Endo/Other  negative endocrine ROS  Renal/GU negative Renal ROS  negative genitourinary   Musculoskeletal negative musculoskeletal ROS (+)   Abdominal   Peds negative pediatric ROS (+)  Hematology negative hematology ROS (+)   Anesthesia Other Findings   Reproductive/Obstetrics negative OB ROS                           Anesthesia Physical Anesthesia Plan  ASA: III  Anesthesia Plan: General   Post-op Pain Management:    Induction: Intravenous  Airway Management Planned: Oral ETT  Additional Equipment:   Intra-op Plan:   Post-operative Plan: Extubation in OR  Informed Consent:   Plan Discussed with: CRNA and Surgeon  Anesthesia Plan Comments:         Anesthesia Quick Evaluation

## 2011-04-27 NOTE — Transfer of Care (Signed)
Immediate Anesthesia Transfer of Care Note  Patient: [name]  Procedure(s) Performed: Procedure(s) (LRB): AMPUTATION ABOVE KNEE (Right)  Patient Location: PACU  Anesthesia Type: General  Level of Consciousness: awake and alert   Airway & Oxygen Therapy: Patient Spontanous Breathing and Patient connected to face mask oxygen  Post-op Assessment: Report given to PACU RN and Post -op Vital signs reviewed and stable  Post vital signs: Reviewed and stable  Complications: No apparent anesthesia complications

## 2011-04-27 NOTE — Interval H&P Note (Signed)
History and Physical Interval Note:  04/27/2011 1:34 PM  [name]  has presented today for surgery, with the diagnosis of Non healing wound right   The various methods of treatment have been discussed with the patient and family. After consideration of risks, benefits and other options for treatment, the patient has consented to  Procedure(s) (LRB):Revision amputation of the right above the knee amputation AMPUTATION ABOVE KNEE (Right) as a surgical intervention .  The patients' history has been reviewed, patient examined, no change in status, stable for surgery.  I have reviewed the patients' chart and labs.  Questions were answered to the patient's satisfaction.     [name]

## 2011-04-27 NOTE — Brief Op Note (Signed)
04/27/2011  3:08 PM  PATIENT:  [name]  66 y.o. female  PRE-OPERATIVE DIAGNOSIS:  Non healing wound right above knee amputation and painful  POST-OPERATIVE DIAGNOSIS:  Non healing wound right above knee amputation and painful above the knee amputation stump  PROCEDURE:  Procedure(s) (LRB): REVISION right  AMPUTATION ABOVE KNEE (Right)  SURGEON:  Surgeon(s) and Role:    * [name], MD - Primary  PHYSICIAN ASSISTANT: Lanney Gins, PA-C  ANESTHESIA:   general  EBL:  Total I/O In: 1000 [I.V.:1000] Out: -   BLOOD ADMINISTERED:none  DRAINS: none   LOCAL MEDICATIONS USED:  NONE  SPECIMEN:  No Specimen  DISPOSITION OF SPECIMEN:  N/A  COUNTS:  YES  TOURNIQUET:  * No tourniquets in log *  DICTATION: .Other Dictation: Dictation Number [id number]  PLAN OF CARE: Admit to inpatient   PATIENT DISPOSITION:  PACU - hemodynamically stable.   Delay start of Pharmacological VTE agent (>24hrs) due to surgical blood loss or risk of bleeding: no

## 2011-04-28 LAB — CBC
HCT: 27.1 % — ABNORMAL LOW (ref 36.0–46.0)
MCHC: 34.7 g/dL (ref 30.0–36.0)
Platelets: 175 10*3/uL (ref 150–400)
RDW: 13.1 % (ref 11.5–15.5)

## 2011-04-28 LAB — BASIC METABOLIC PANEL
BUN: 8 mg/dL (ref 6–23)
Creatinine, Ser: 0.52 mg/dL (ref 0.50–1.10)
GFR calc Af Amer: >90 mL/min (ref >90)
GFR calc non Af Amer: >90 mL/min (ref >90)
Potassium: 4.1 mEq/L (ref 3.5–5.1)

## 2011-04-28 MED ORDER — NON FORMULARY: 40.0000 mg | Freq: Every day | Status: DC

## 2011-04-28 MED ORDER — OMEPRAZOLE 20 MG PO CPDR
40.0000 mg | DELAYED_RELEASE_CAPSULE | Freq: Every day | ORAL | Status: DC
  Administered 2011-04-28 – 2011-04-30 (×3): 40 mg via ORAL
  Filled 2011-04-28 (×3): qty 2

## 2011-04-28 NOTE — Progress Notes (Signed)
Subjective: 1 Day Post-Op Procedure(s) (LRB): AMPUTATION ABOVE KNEE (Right)   Patient reports pain as mild. No events throughout the night.  Objective:   VITALS:   Filed Vitals:   04/28/11 1040  BP: 109/68  Pulse: 67  Temp: 98.1 F (36.7 C)  Resp: 18    Incision: dressing C/D/I No cellulitis present Compartment soft  LABS  Basename 04/28/11 0420  HGB 9.4*  HCT 27.1*  WBC 4.0  PLT 175     Basename 04/28/11 0420  NA 134*  K 4.1  BUN 8  CREATININE 0.52  GLUCOSE 100*     Assessment/Plan: 1 Day Post-Op Procedure(s) (LRB): AMPUTATION ABOVE KNEE (Right)   Advance diet Up with therapy Discharge home with home health upon discharge, possibly Thur/Fri   Anastasio Auerbach. Jessice Madill   PAC  04/28/2011, 11:19 AM

## 2011-04-28 NOTE — Progress Notes (Signed)
CARE MANAGEMENT NOTE 04/28/2011  Patient:  [name], [name]   Account Number:  [id number]  Date Initiated:  04/28/2011  Documentation initiated by:  Jahaan Vanwagner  Subjective/Objective Assessment:   66 yo female admitted 04/27/11 with non healing wound     Action/Plan:   D/C when medically stable   Anticipated DC Date:  05/01/2011   Anticipated DC Plan:  Home with Home Health Services.      DC Planning Services  CM consult      [facility] Choice  Resumption Of Svcs/PTA Provider   Choice offered to / List presented to:  C-1 PatientH        HH arranged  HH-2 PT      [facility] agency  [facility]   Status of service:  In process, will continue to follow  Comments:  04/28/11, Kathi Der RNC-MNN, BSN, [phone], CM received referral.  CM met with pt.  Pt states she is active with [facility] and would like to continue their services.  Pt states that she lives alone, but has many friends within her complex in which she lives that she can call if she needs help and/or assistance.  Darl Pikes at [facility] contacted with orders and confirmation of services received.  Will follow for additional orders.

## 2011-04-28 NOTE — Evaluation (Signed)
Physical Therapy Evaluation Patient Details Name: [name] MRN: [id number] DOB: 02/28/1945 Today's Date: 04/28/2011  Problem List:  Patient Active Problem List  Diagnoses  . Atrial fibrillation with RVR  . Hypotension, unspecified  . Cellulitis and abscess of leg  . Insomnia  . Anemia  . Status post amputation of leg  . Discoloration of skin of lower leg  . Delayed surgical wound healing of below-the-knee amputation stump    Past Medical History:  Past Medical History  Diagnosis Date  . Emphysema   . Coronary artery disease   . Atrial fibrillation   . COPD (chronic obstructive pulmonary disease)   . History of atrial fibrillation     S/pP Ablation  . Asthma   . Chronic kidney disease     HX OF UTI  . Arthritis   . Osteoarthritis   . Rheumatoid arthritis   . Anemia   . B12 deficiency anemia   . Stroke     slight stroke in 2009-no deficits  . Neuromuscular disorder     hx of right quadricep rupture  . DJD (degenerative joint disease)   . History of benign bladder tumor   . DVT (deep venous thrombosis)   . Blood transfusion     2009    Past Surgical History:  Past Surgical History  Procedure Date  . Replacement total knee   . Cardiac electrophysiology mapping and ablation   . Cholecystectomy 1996  . Cataract extraction, bilateral   . Hematoma evacuation 2009    From right groin  . Joint replacement     Left-1996, Right-1995  . Multiple tooth extractions 06/30/2010  . Trigger finger release 2011  . Amputation 12/17/2010    Procedure: AMPUTATION ABOVE KNEE;  Surgeon: [name];  Location: [facility];  Service: Orthopedics;  Laterality: Right;  . Application of wound vac 12/17/2010    Procedure: APPLICATION OF WOUND VAC;  Surgeon: [name];  Location: [facility];  Service: Orthopedics;  Laterality: Right;  wound Vac applied @ 2045 By Luna Fuse    PT Assessment/Plan/Recommendation PT Assessment Clinical Impression Statement: Patient is a 66 y.o.  female known to therapy from previous amputation.  She presents close to baseline despite revision of AKA done yesterday.  She is eager to return home as well as progress to prosthetic training when wounds are healed.  She will benefit from skilled PT in the acute setting due to acute pain with decreased activity tolerance and decreased mobility to allow d/c home with HHPT initially. PT Recommendation/Assessment: Patient will need skilled PT in the acute care venue PT Problem List: Decreased activity tolerance;Decreased mobility;Pain;Decreased strength PT Therapy Diagnosis : Acute pain;Generalized weakness PT Plan PT Frequency: Min 6X/week PT Treatment/Interventions: DME instruction;Gait training;Functional mobility training;Therapeutic activities;Therapeutic exercise;Balance training;Wheelchair mobility training;Patient/family education PT Recommendation Follow Up Recommendations: Home health PT Equipment Recommended: None recommended by PT PT Goals  Acute Rehab PT Goals PT Goal Formulation: With patient Time For Goal Achievement: 6 days Pt will go Sit to Stand: with modified independence PT Goal: Sit to Stand - Progress: Goal set today Pt will go Stand to Sit: with modified independence PT Goal: Stand to Sit - Progress: Goal set today Pt will Transfer Bed to Chair/Chair to Bed: with modified independence PT Transfer Goal: Bed to Chair/Chair to Bed - Progress: Goal set today Pt will Ambulate: 1 - 15 feet;with mod assist;with rolling walker PT Goal: Ambulate - Progress: Goal set today Pt will Perform Home Exercise Program: Independently  PT Goal: Perform Home Exercise Program - Progress: Goal set today Pt will Propel Wheelchair: > 150 feet;Independently PT Goal: Propel Wheelchair - Progress: Goal set today  PT Evaluation Precautions/Restrictions  Precautions Precautions: Fall Prior Functioning  Home Living Lives With: Alone Type of Home: Apartment Home Layout: One level Home  Access: Elevator Bathroom Shower/Tub: Walk-in shower Home Adaptive Equipment: Hospital bed;Bedside commode/3-in-1;Wheelchair - manual;Walker - rolling;Walker - four wheeled;Grab bars in shower Prior Function Level of Independence: Independent with basic ADLs;Requires assistive device for independence;Independent with transfers;Independent with homemaking with wheelchair Driving: Yes (in past few weeks, friends assist with chair) Cognition Cognition Arousal/Alertness: Awake/alert Overall Cognitive Status: Appears within functional limits for tasks assessed Sensation/Coordination   Extremity Assessment RLE Assessment RLE Assessment: Exceptions to [facility] RLE AROM (degrees) Overall AROM Right Lower Extremity: Within functional limits for tasks assessed RLE Overall AROM Comments: hip extension to neutral tested in standing RLE Strength RLE Overall Strength: Deficits;Due to pain (lifts antigravity admits to decreased proprioception) LLE Assessment LLE Assessment: Within Functional Limits Mobility (including Balance) Bed Mobility Bed Mobility: Yes Supine to Sit: 6: Modified independent (Device/Increase time) Sit to Supine: 6: Modified independent (Device/Increase time) Transfers Transfers: Yes Sit to Stand: 5: Supervision;With upper extremity assist;From bed;From chair/3-in-1;With armrests Sit to Stand Details (indicate cue type and reason): supervision for safety and assist for set up; performed 3 times (bed to 3:1; 3:1 to wheelchair; wheelchair to bed) Stand to Sit: 5: Supervision;With upper extremity assist;With armrests;To bed;To chair/3-in-1 Stand to Sit Details: for safety and assist for set up Stand Pivot Transfers: 5: Supervision Stand Pivot Transfer Details (indicate cue type and reason): with UE assist and using bed rail as she pivots; assist for w/c set up or OOB, she set up w/c for back to bed Ambulation/Gait Ambulation/Gait: No Wheelchair Mobility Wheelchair Mobility:  Yes Wheelchair Assistance: 6: Modified independent (Device/Increase time) Wheelchair Propulsion: Both upper extremities;Right upper extremity Wheelchair Parts Management: Independent Distance: 200' (Also demo to patient how to fold chair and remove wheels as well as how to lift chair due to her desire to drive independently.  Discussed also resources for getting assist go get handicapped Zenaida Niece.)    Exercise  Amputee Exercises Hip Extension: AROM;Right;Standing;5 reps Straight Leg Raises: AROM;Right;5 reps;Seated End of Session PT - End of Session Equipment Utilized During Treatment: Gait belt Activity Tolerance: Patient tolerated treatment well Patient left: in bed;with call bell in reach General Behavior During Session: [facility] for tasks performed Cognition: [facility] for tasks performed  [facility] 04/28/2011, 12:17 PM

## 2011-04-28 NOTE — Op Note (Signed)
NAMEKIEU, QUIGGLE          ACCOUNT NO.:  [id number]  MEDICAL RECORD NO.:  [id number]  LOCATION:  1302                         FACILITY:  [facility]  PHYSICIAN:  Madlyn Frankel. Charlann Boxer, M.D.  DATE OF BIRTH:  07/17/1945  DATE OF PROCEDURE:  04/27/2011 DATE OF DISCHARGE:                              OPERATIVE REPORT   PREOPERATIVE DIAGNOSIS:  Painful right above-the-knee amputation with delayed wound healing.  POSTOPERATIVE DIAGNOSIS:  Painful right above-the-knee amputation with delayed wound healing.  PROCEDURE:  Revision, right above-the-knee amputation including incision and debridement of skin, subcutaneous tissue, muscle, and bone.  SURGEON:  Madlyn Frankel. Charlann Boxer, M.D.  ASSISTANT:  Lanney Gins, PA-C.  Mr. Carmon Sails was present for the entirety of the case, utilized for lower extremity management and retractor management as well as general facilitation of the case and wound closure.  ANESTHESIA:  General.  SPECIMENS:  None.  DRAINS:  None.  BLOOD LOSS:  Probably about 200 mL.  INDICATIONS FOR PROCEDURE:  Ms. Delangel is a 66 year old female, a patient of mine from previous urgent right above-the-knee amputation for chronic infection of the right knee.  She had delayed healing of her distal portion of the wound, but also was noted to have discomfort over the anterior aspect of the knee with concern of prominence of her distal end of the femur.  After some discussion and deliberation with the size, the best option was to proceed back to the surgery given the significant amount of infection was present initially.  Her thigh at this point was significantly better and healed.  No signs of active infection.  Risks and benefits of the procedure were discussed and reviewed.  Consent was obtained for above.  PROCEDURE IN DETAIL:  The patient was brought to the operative theater. Once adequate anesthesia, preop antibiotics, Ancef administered; she was positioned supine.  The right  perineum was prepped and was draped out. The right lower extremity was then prepped and draped in sterile fashion.  Time-out was performed identifying the patient, planned procedure, and extremity.  At this point, an elliptical fishmouth type incision was made over the distal portion of the femur, excising out the delayed skin healing site. Soft tissue dissection was carried down to the repaired fascial layer over the end of the femur.  I then incised this and exposed the distal end of the femur, using a Cobb elevator to free up the soft tissues. With retractors, the distal end of the femur was then cut 1 inch above the previous site and then the edges of the bone contoured back.  Once this was done, I debride the soft tissues as she was hypersensitive over the posterolateral aspect of the leg.  I did not identify exactly any nerve fibers; however, the soft tissues were debrided in this area.  Following these procedures, the knee was irrigated with normal saline solution with pulse lavage, approximately 2 L.  At this point, I reapproximated the remaining muscle fascia back to itself using a #1 Vicryl.  Then we began to reapproximate the skin.  I used 2-0 Vicryl.  I was able to excise some more redundant skin.  Her skin was noted to be fragile particularly on the posterior flap.  Once the skin edges were reapproximated,  staples were used to close the remainder of the skin.  The skin was closed without significant tension.  The skin at this point was cleaned, dried, and dressed sterilely using Xeroform, sterile gauze, ABDs, and tape.  She was then extubated and brought to recovery room in stable condition, tolerating the procedure well.     Madlyn Frankel Charlann Boxer, M.D.     MDO/MEDQ  D:  04/27/2011  T:  04/28/2011  Job:  [id number]

## 2011-04-29 LAB — BASIC METABOLIC PANEL
CO2: 26 mEq/L (ref 19–32)
Calcium: 8.3 mg/dL — ABNORMAL LOW (ref 8.4–10.5)
Chloride: 102 mEq/L (ref 96–112)
Creatinine, Ser: 0.56 mg/dL (ref 0.50–1.10)
Glucose, Bld: 100 mg/dL — ABNORMAL HIGH (ref 70–99)

## 2011-04-29 LAB — CBC
MCHC: 34.7 g/dL (ref 30.0–36.0)
RDW: 13.1 % (ref 11.5–15.5)
WBC: 4.8 10*3/uL (ref 4.0–10.5)

## 2011-04-29 MED ORDER — HYDROCODONE-ACETAMINOPHEN 7.5-325 MG PO TABS
1.0000 | ORAL_TABLET | ORAL | Status: DC
  Administered 2011-04-29 (×2): 1 via ORAL
  Administered 2011-04-29 (×2): 2 via ORAL
  Administered 2011-04-30 (×2): 1 via ORAL
  Administered 2011-04-30 (×2): 2 via ORAL
  Filled 2011-04-29: qty 2
  Filled 2011-04-29 (×3): qty 1
  Filled 2011-04-29 (×2): qty 2
  Filled 2011-04-29 (×2): qty 1
  Filled 2011-04-29: qty 2

## 2011-04-29 MED ORDER — VITAMINS A & D EX OINT
TOPICAL_OINTMENT | CUTANEOUS | Status: AC
  Administered 2011-04-29: 1
  Filled 2011-04-29: qty 5

## 2011-04-29 NOTE — Progress Notes (Signed)
Subjective: 2 Days Post-Op Procedure(s) (LRB): AMPUTATION ABOVE KNEE (Right)   Patient reports pain as mild. Some lateral pain, but over good. No events.  Objective:   VITALS:   Filed Vitals:   04/29/11 0525  BP: 97/58  Pulse: 76  Temp: 99.3 F (37.4 C)  Resp: 16    Incision: dressing C/D/I No cellulitis present Compartment soft  LABS  Basename 04/29/11 0335 04/28/11 0420  HGB 9.0* 9.4*  HCT 25.9* 27.1*  WBC 4.8 4.0  PLT 182 175     Basename 04/29/11 0335 04/28/11 0420  NA 134* 134*  K 3.7 4.1  BUN 6 8  CREATININE 0.56 0.52  GLUCOSE 100* 100*     Assessment/Plan: 2 Days Post-Op Procedure(s) (LRB): AMPUTATION ABOVE KNEE (Right)   Up with therapy Plan for discharge tomorrow to home, if she continues to do well. Will change dressing on Friday.   [name]   PAC  04/29/2011, 12:16 PM

## 2011-04-29 NOTE — Evaluation (Signed)
Occupational Therapy Evaluation Patient Details Name: [name] MRN: [id number] DOB: September 19, 1945 Today's Date: 04/29/2011 9:33-10:14  evII Problem List:  Patient Active Problem List  Diagnoses  . Atrial fibrillation with RVR  . Hypotension, unspecified  . Cellulitis and abscess of leg  . Insomnia  . Anemia  . Status post amputation of leg  . Discoloration of skin of lower leg  . Delayed surgical wound healing of below-the-knee amputation stump    Past Medical History:  Past Medical History  Diagnosis Date  . Emphysema   . Coronary artery disease   . Atrial fibrillation   . COPD (chronic obstructive pulmonary disease)   . History of atrial fibrillation     S/pP Ablation  . Asthma   . Chronic kidney disease     HX OF UTI  . Arthritis   . Osteoarthritis   . Rheumatoid arthritis   . Anemia   . B12 deficiency anemia   . Stroke     slight stroke in 2009-no deficits  . Neuromuscular disorder     hx of right quadricep rupture  . DJD (degenerative joint disease)   . History of benign bladder tumor   . DVT (deep venous thrombosis)   . Blood transfusion     2009    Past Surgical History:  Past Surgical History  Procedure Date  . Replacement total knee   . Cardiac electrophysiology mapping and ablation   . Cholecystectomy 1996  . Cataract extraction, bilateral   . Hematoma evacuation 2009    From right groin  . Joint replacement     Left-1996, Right-1995  . Multiple tooth extractions 06/30/2010  . Trigger finger release 2011  . Amputation 12/17/2010    Procedure: AMPUTATION ABOVE KNEE;  Surgeon: [name];  Location: [facility];  Service: Orthopedics;  Laterality: Right;  . Application of wound vac 12/17/2010    Procedure: APPLICATION OF WOUND VAC;  Surgeon: [name];  Location: [facility];  Service: Orthopedics;  Laterality: Right;  wound Vac applied @ 2045 By Luna Fuse    OT Assessment/Plan/Recommendation OT Assessment Clinical Impression Statement:  Pleasant 66 yr old female with recent right AKA.  Now presents with increased pain but still overall modified independent from wheelchair level for all simulated selfcare tasks and ADLs.  Already has all DME needed at home.  No further OT needs at this time. OT Recommendation/Assessment: Patient does not need any further OT services OT Recommendation Follow Up Recommendations: No OT follow up Equipment Recommended: None recommended by OT  OT Evaluation Precautions/Restrictions  Precautions Precautions: Fall Required Braces or Orthoses: No Restrictions Weight Bearing Restrictions: No Prior Functioning Home Living Lives With: Alone Type of Home: Apartment Home Layout: One level Home Access: Elevator Bathroom Shower/Tub: Walk-in shower Home Adaptive Equipment: Hospital bed;Bedside commode/3-in-1;Wheelchair - manual;Walker - rolling;Walker - four wheeled;Grab bars in shower Prior Function Level of Independence: Independent with basic ADLs;Requires assistive device for independence;Independent with transfers;Independent with homemaking with wheelchair Driving: Yes (in past few weeks, friends assist with chair) ADL ADL Eating/Feeding: Simulated;Independent Where Assessed - Eating/Feeding: Wheelchair Grooming: Modified independent Where Assessed - Grooming: Sitting, chair Upper Body Bathing: Simulated;Modified independent Where Assessed - Upper Body Bathing: Sitting, chair Lower Body Bathing: Modified independent Where Assessed - Lower Body Bathing: Sit to stand from bed Upper Body Dressing: Modified independent Where Assessed - Upper Body Dressing: Sitting, chair Lower Body Dressing: Simulated;Modified independent Where Assessed - Lower Body Dressing: Sit to stand from chair Toilet Transfer: Performed;Modified  independent Statistician Method: Stand Wellsite geologist: Raised toilet seat with arms (or 3-in-1 over toilet) Toileting - Clothing Manipulation:  Simulated;Modified independent Where Assessed - Toileting Clothing Manipulation: Sit to stand from 3-in-1 or toilet Toileting - Hygiene: Simulated Where Assessed - Toileting Hygiene: Sit to stand from 3-in-1 or toilet Tub/Shower Transfer: Not assessed Tub/Shower Transfer Method: Not assessed Equipment Used: Wheelchair Ambulation Related to ADLs: Pt currently unable ambulate.   ADL Comments: Pt overall modified independent for simulated selfcare tasks and ADLs.  Pt performs all toileting and selfcare sit to stand from the wheelchair.  Has a reacher and 3:1 at home.  Currently not toerating any mobility other than the wheelchair until she heals and can strat prosthetic training. Vision/Perception  Vision - History Baseline Vision: Wears glasses all the time Patient Visual Report: No change from baseline Vision - Assessment Eye Alignment: Within Functional Limits Perception Perception: Within Functional Limits Praxis Praxis: Intact Cognition Cognition Arousal/Alertness: Awake/alert Overall Cognitive Status: Appears within functional limits for tasks assessed Orientation Level: Oriented X4 Sensation/Coordination Sensation Light Touch: Impaired Detail Light Touch Impaired Details: Impaired RUE;Impaired LUE (Numbness in bilateral fingers.) Stereognosis: Not tested Hot/Cold: Not tested Proprioception: Not tested Coordination Gross Motor Movements are Fluid and Coordinated: Yes Fine Motor Movements are Fluid and Coordinated: Yes Extremity Assessment RUE Assessment RUE Assessment: Within Functional Limits LUE Assessment LUE Assessment: Within Functional Limits Mobility  Bed Mobility Bed Mobility: No Transfers Transfers: Yes Sit to Stand: 6: Modified independent (Device/Increase time);With armrests;Other (comment);With upper extremity assist (From wheelchair.) Exercises   End of Session OT - End of Session Activity Tolerance: Patient tolerated treatment well Patient left: in  chair;with call bell in reach General Behavior During Session: [facility] for tasks performed Cognition: [facility] for tasks performed   Esco Joslyn OTR/L 04/29/2011, 12:31 PM  Pager number [phone]

## 2011-04-29 NOTE — Progress Notes (Signed)
Physical Therapy Treatment Patient Details Name: [name] MRN: [id number] DOB: Aug 03, 1945 Today's Date: 04/29/2011  PT Assessment/Plan  PT - Assessment/Plan Comments on Treatment Session: Patient unable to tolerate getting into prone position due to pain outer end of residual limb.  She tolerated all other exercises well.  Able to stand with UE assist for right LE exercises as well.  Did note left knee instability with varus stress.  Discussed with MD.  May need to hold on ambulation until knee evaluated for stability. PT Plan: Discharge plan remains appropriate PT Frequency: Min 6X/week Follow Up Recommendations: Home health PT Equipment Recommended: None recommended by PT PT Goals  Acute Rehab PT Goals Pt will go Sit to Stand: with modified independence PT Goal: Sit to Stand - Progress: Progressing toward goal Pt will go Stand to Sit: with modified independence PT Goal: Stand to Sit - Progress: Progressing toward goal Pt will Transfer Bed to Chair/Chair to Bed: with modified independence PT Transfer Goal: Bed to Chair/Chair to Bed - Progress: Progressing toward goal Pt will Perform Home Exercise Program: Independently PT Goal: Perform Home Exercise Program - Progress: Progressing toward goal Pt will Propel Wheelchair: > 150 feet;Independently PT Goal: Propel Wheelchair - Progress: Progressing toward goal  PT Treatment Precautions/Restrictions  Precautions Precautions: Fall Required Braces or Orthoses: No Restrictions Weight Bearing Restrictions: No Mobility (including Balance) Bed Mobility Bed Mobility: No Rolling Left: 6: Modified independent (Device/Increase time) Rolling Left Details (indicate cue type and reason): but unable to get into prone through left rolling due to pain right leg Supine to Sit: 6: Modified independent (Device/Increase time) Sit to Supine: 6: Modified independent (Device/Increase time) Transfers Sit to Stand: 6: Modified independent  (Device/Increase time);With armrests;Other (comment);With upper extremity assist (From wheelchair.) Sit to Stand Details (indicate cue type and reason): performed 6 times for L LE strengthening from wheelchair up to wall rail Stand to Sit: 6: Modified independent (Device/Increase time);To chair/3-in-1;With armrests Stand Pivot Transfers: 6: Modified independent (Device/Increase time) Stand Pivot Transfer Details (indicate cue type and reason): w/c <>bed Wheelchair Mobility Wheelchair Assistance: 6: Modified independent (Device/Increase time) Occupational hygienist: Both upper extremities Wheelchair Parts Management: Independent Distance: 50' househould environ    Exercise  Amputee Exercises Towel Squeeze: AROM;Both;10 reps;Supine Hip Extension: AROM;Standing;Right;10 reps;Other (comment) (also performed supine isometric x 5 sec hold x 10 reps) Hip ABduction/ADduction: AROM;Right;10 reps;Supine End of Session PT - End of Session Activity Tolerance: Patient tolerated treatment well Patient left: in chair;with call bell in reach General Behavior During Session: [facility] for tasks performed Cognition: [facility] for tasks performed  [facility] 04/29/2011, 12:36 PM

## 2011-04-29 NOTE — Patient Instructions (Signed)
Educated patient on community resources for handicapped Chief Technology Officer through [employer] and [employer].

## 2011-04-30 MED ORDER — HYDROCODONE-ACETAMINOPHEN 7.5-325 MG PO TABS: 1.0000 | ORAL_TABLET | ORAL | Status: AC

## 2011-04-30 MED ORDER — DSS 100 MG PO CAPS: 100.0000 mg | ORAL_CAPSULE | Freq: Two times a day (BID) | ORAL | Status: AC

## 2011-04-30 MED ORDER — FERROUS SULFATE 325 (65 FE) MG PO TABS: 325.0000 mg | ORAL_TABLET | Freq: Three times a day (TID) | ORAL | Status: DC

## 2011-04-30 MED ORDER — POLYETHYLENE GLYCOL 3350 17 G PO PACK: 17.0000 g | PACK | Freq: Two times a day (BID) | ORAL | Status: AC

## 2011-04-30 MED ORDER — METHOCARBAMOL 500 MG PO TABS: 500.0000 mg | ORAL_TABLET | Freq: Four times a day (QID) | ORAL | Status: AC | PRN

## 2011-04-30 MED ORDER — DIPHENHYDRAMINE HCL 25 MG PO CAPS: 25.0000 mg | ORAL_CAPSULE | Freq: Four times a day (QID) | ORAL | Status: DC | PRN

## 2011-04-30 NOTE — Progress Notes (Signed)
Physical Therapy Treatment Patient Details Name: [name] MRN: [id number] DOB: 1945/07/17 Today's Date: 04/30/2011  PT Assessment/Plan  PT - Assessment/Plan Comments on Treatment Session: Able to tolerate hip abduction in sidelying today and performed standing exercises without left LE fatigue.  Applied 6" ace wrap to right leg and educated patient in keeping leg wrapped for shaping for prosthesis. Has shrinker she thinks might work as well since was too big before revised AKA.   PT Plan: Discharge plan remains appropriate PT Frequency: Min 6X/week Follow Up Recommendations: Home health PT Equipment Recommended: None recommended by PT PT Goals  Acute Rehab PT Goals Pt will go Sit to Stand: with modified independence PT Goal: Sit to Stand - Progress: Met Pt will go Stand to Sit: with modified independence PT Goal: Stand to Sit - Progress: Met Pt will Transfer Bed to Chair/Chair to Bed: with modified independence PT Transfer Goal: Bed to Chair/Chair to Bed - Progress: Met Pt will Perform Home Exercise Program: Independently PT Goal: Perform Home Exercise Program - Progress: Progressing toward goal  PT Treatment Precautions/Restrictions  Precautions Precautions: Fall Required Braces or Orthoses: No Restrictions Weight Bearing Restrictions: No Mobility (including Balance) Bed Mobility Supine to Sit: 6: Modified independent (Device/Increase time) Sit to Supine: 6: Modified independent (Device/Increase time) Transfers Sit to Stand: 6: Modified independent (Device/Increase time);With armrests Stand to Sit: 6: Modified independent (Device/Increase time);With armrests Stand Pivot Transfers: 6: Modified independent (Device/Increase time);With armrests Stand Pivot Transfer Details (indicate cue type and reason): w/c<>bed  Balance Balance Assessed: Yes Static Standing Balance Static Standing - Balance Support: Bilateral upper extremity supported Static Standing - Level of  Assistance: 6: Modified independent (Device/Increase time) Static Standing - Comment/# of Minutes: stood x 3 minutes Exercise  Amputee Exercises Towel Squeeze: AROM;Both;10 reps;Supine Hip Extension: AROM;Right;15 reps;Standing (also supine isometric x 5sec hold x 10 reps) Hip ABduction/ADduction: Sidelying;Standing;10 reps;Right;AROM (performed both standing and sidelying) Chair Push Up: AROM;10 reps;Both;Seated Other Exercises Other Exercises: 1/2 bridge on left with keeping right leg against bed x 10 reps End of Session PT - End of Session Equipment Utilized During Treatment: Gait belt Activity Tolerance: Patient tolerated treatment well Patient left: in chair;with call bell in reach General Behavior During Session: [facility] for tasks performed Cognition: [facility] for tasks performed  [facility] 04/30/2011, 1:17 PM

## 2011-04-30 NOTE — Discharge Summary (Signed)
Physician Discharge Summary  Patient ID: [name] MRN: [id number] DOB/AGE: 66-07-1945 66 y.o.  Admit date: 04/27/2011 Discharge date: 04/30/2011  Procedures:  Procedure(s) (LRB): AMPUTATION ABOVE KNEE (Right)  Attending Physician:  Dr. Durene Romans   Admission Diagnoses:  Right leg amputation stump complication  Discharge Diagnoses:  Principal Problem:  *Delayed surgical wound healing of below-the-knee amputation stump Emphysema   Coronary artery disease   Atrial fibrillation   COPD  History of atrial fibrillation   Asthma   Chronic kidney disease   Arthritis   Osteoarthritis   Rheumatoid arthritis   Anemia   B12 deficiency anemia   Stroke - slight stroke in 2009-no deficits   DJD (degenerative joint disease)  History of benign bladder tumor   Hx DVT    HPI: Pt is a 66 y.o. female complaining of right amputation stump complications. Pt has had many previous surgeries on her right knee, including the original total, later revision, infection of the knee with removal of the prosthesis and reimplantation of the prosthesis. Her last visit to the hospital started with a repair of the quad tendon, on 12/09/10. While in the hospital she had several complications including the blisters the rapidly filled with pus on the right knee. 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 Right after surgery she had a wound vac placed to help remove the edema of the right stump. Pt presents to the clinic most recently complaining of an area that won't close up, that the distal femur feels a little long and that some of the nerve pain is bothering her a lot. Various options are discussed with the patient. Risks, benefits and expectations were discussed with the patient. Patient understand the risks, benefits and expectations and wishes to proceed with surgery to revise the right amputation stump.  PCP: Marylen Ponto, MD, MD   Discharged Condition: good  Hospital Course:  Patient underwent  the above stated procedure on 04/27/2011. Patient tolerated the procedure well and brought to the recovery room in good condition and subsequently to the floor.  POD #1 BP: 109/68 ; Pulse: 67  ;Temp: 98.1 F (36.7 C) ; Resp: 18  Pt's foley was removed, as well as the hemovac drain removed. IV was changed to a saline lock. Patient reports pain as mild. No events throughout the night. Incision: dressing C/D/I, no cellulitis present and compartment soft.   LABS  Basename  04/28/11 0420   HGB  9.4  HCT  27.1    POD #2  BP: 97/58 ; Pulse: 76 ; Temp: 99.3 F (37.4 C) ; Resp: 16  Patient reports pain as mild. Some lateral pain, but over good. No events.  Incision: dressing C/D/I, no cellulitis present and compartment soft.   LABS  Basename  04/29/11 0335   HGB  9.0  HCT  25.9   POD #3  BP: 126/64 ; Pulse: 80 ; Temp: 98 F (36.7 C) ; Resp: 17  Patient reports pain as mild. Complaining of some spasms of the distal right stump, but otherwise good. Dressing changed and incision looks to be healing well with minimal drainage.  She is in good spirits and looking forward to being able to get a prothesis. Ready to be discharged home. Incision: dressing C/D/I, no cellulitis present and compartment soft.   LABS   No new labs  Discharge Exam: General appearance: alert, cooperative and no distress Extremities: no ulcers, gangrene or trophic changes  Disposition: 03-Skilled Nursing Facility with follow up in 2 weeks  Follow-up Information    Follow up with OLIN,Thresea Doble D in 2 weeks.   Contact information:   [facility] [address] [place] [address] [phone]          Discharge Orders    Future Orders Please Complete By Expires   Diet - low sodium heart healthy      Call MD / Call 911      Comments:   If you experience chest pain or shortness of breath, CALL 911 and be transported to the hospital emergency room.  If you develope a  fever above 101 F, pus (white drainage) or increased drainage or redness at the wound, or calf pain, call your surgeon's office.   Discharge instructions      Comments:   Replace dressing with gauze and tape dailty. Keep the area dry and clean until follow up. Use ACE bandages for compression. Follow up in 2 weeks at [facility]. Call with any questions or concerns.     Constipation Prevention      Comments:   Drink plenty of fluids.  Prune juice may be helpful.  You may use a stool softener, such as Colace (over the counter) 100 mg twice a day.  Use MiraLax (over the counter) for constipation as needed.   Increase activity slowly as tolerated      Weight Bearing as taught in Physical Therapy      Comments:   Use a walker or crutches as instructed.   Driving restrictions      Comments:   No driving for 4 weeks   Change dressing      Comments:   Daily dressing changes with sterile 4 x 4 inch gauze dressing and tape. Keep the area dry and clean. ACE bandage wrapping for compression.      Current Discharge Medication List    START taking these medications   Details  diphenhydrAMINE (BENADRYL) 25 mg capsule Take 1 capsule (25 mg total) by mouth every 6 (six) hours as needed for itching, allergies or sleep.    docusate sodium 100 MG CAPS Take 100 mg by mouth 2 (two) times daily. Qty: 10 capsule    ferrous sulfate 325 (65 FE) MG tablet Take 1 tablet (325 mg total) by mouth 3 (three) times daily after meals.    HYDROcodone-acetaminophen (NORCO) 7.5-325 MG per tablet Take 1-2 tablets by mouth every 4 (four) hours. Qty: 50 tablet, Refills: 0    polyethylene glycol (MIRALAX / GLYCOLAX) packet Take 17 g by mouth 2 (two) times daily.      CONTINUE these medications which have CHANGED   Details  methocarbamol (ROBAXIN) 500 MG tablet Take 1 tablet (500 mg total) by mouth every 6 (six) hours as needed. Qty: 50 tablet, Refills: 0      CONTINUE these medications which have NOT  CHANGED   Details  amiodarone (PACERONE) 200 MG tablet Take 200 mg by mouth daily. Takes in evening    dabigatran (PRADAXA) 150 MG CAPS Take 1 capsule (150 mg total) by mouth every 12 (twelve) hours.    digoxin (LANOXIN) 0.125 MG tablet Take 0.125 mg by mouth at bedtime.    gabapentin (NEURONTIN) 300 MG capsule Take 300 mg by mouth 3 (three) times daily.    montelukast (SINGULAIR) 10 MG tablet Take 10 mg by mouth at bedtime.    omeprazole (PRILOSEC) 40 MG capsule Take 40 mg by mouth daily. Takes in evening    senna-docusate (SENOKOT-S) 8.6-50  MG per tablet Take 1 tablet by mouth at bedtime as needed for constipation.    traZODone (DESYREL) 50 MG tablet Take 50 mg by mouth at bedtime.      STOP taking these medications     doxycycline (DORYX) 100 MG DR capsule Comments:  Reason for Stopping:       traMADol (ULTRAM) 50 MG tablet Comments:  Reason for Stopping:          Signed: Anastasio Auerbach. Yosmar Ryker   PAC  04/30/2011, 1:21 PM

## 2011-04-30 NOTE — Progress Notes (Addendum)
Subjective: 3 Days Post-Op Procedure(s) (LRB): AMPUTATION ABOVE KNEE (Right)   Patient reports pain as mild. Complaining of some spasms of the distal right stump, but otherwise good. She is in good spirits and looking forward to being able to get a prothesis.  Objective:   VITALS:   Filed Vitals:   04/30/11 0600  BP: 126/64  Pulse: 80  Temp: 98 F (36.7 C)  Resp: 17    Incision: no drainage and scant drainage No cellulitis present Compartment soft  LABS  Basename 04/29/11 0335 04/28/11 0420  HGB 9.0* 9.4*  HCT 25.9* 27.1*  WBC 4.8 4.0  PLT 182 175     Basename 04/29/11 0335 04/28/11 0420  NA 134* 134*  K 3.7 4.1  BUN 6 8  CREATININE 0.56 0.52  GLUCOSE 100* 100*     Assessment/Plan: 3 Days Post-Op Procedure(s) (LRB): AMPUTATION ABOVE KNEE (Right)   Up with therapy Discharge home with home health Follow up in 2 weeks at [facility].  Follow-up Information    Follow up with OLIN,Sebastian Dzik D in 2 weeks.   Contact information:   [facility] [address] [place] [address] [phone]          Anastasio Auerbach. Sharunda Salmon   PAC  04/30/2011, 9:52 AM

## 2011-05-05 ENCOUNTER — Encounter (HOSPITAL_COMMUNITY): Payer: Self-pay | Admitting: Orthopedic Surgery

## 2011-06-02 ENCOUNTER — Ambulatory Visit (HOSPITAL_COMMUNITY): Payer: Self-pay | Admitting: Dentistry

## 2011-06-02 VITALS — BP 154/68 | HR 68 | Temp 97.5°F

## 2011-06-02 DIAGNOSIS — Z463 Encounter for fitting and adjustment of dental prosthetic device: Secondary | ICD-10-CM

## 2011-06-02 DIAGNOSIS — K08109 Complete loss of teeth, unspecified cause, unspecified class: Secondary | ICD-10-CM

## 2011-06-02 DIAGNOSIS — K1370 Unspecified lesions of oral mucosa: Secondary | ICD-10-CM

## 2011-06-02 NOTE — Progress Notes (Signed)
Wednesday, June 02, 2011   BP: 154/68             P:                T: 97.5  [name] is a 66 year old female who presents for evaluation for denture adjustment. Upper and lower complete dentures were inserted in November of 2012.   Subjective: Patient is complaining of difficulty wearing lower denture. Exam: There is no signs of irritation or erythema. Patient is edentulous. The tissues looked healthy. There is no evidence of exposed bone.  Procedure: Pressure indicating paste was applied to upper and lower complete dentures. Adjustments made as needed. [place]. Occlusion evaluated and adjustments made as needed for centric relation and protrusive strokes. Patient accepts results. We discussed possible upper and lower complete denture reline procedures in May or June. Patient currently not interested in pursuing this at this time.  Patient will call if she changes her mind. Patient is aware that Medicaid prior approval will need to be obtained prior to the reline procedures. Patient to keep dentures out if sore spots develop. Use salt water rinses as needed to aid healing. Return to clinic as scheduled for denture adjustment. Call if problems arise before then. Patient dismissed in stable condition. Dr. Kristin Bruins

## 2011-08-02 ENCOUNTER — Ambulatory Visit: Payer: Medicare Other | Attending: Orthopedic Surgery | Admitting: Physical Therapy

## 2011-08-02 DIAGNOSIS — S78119A Complete traumatic amputation at level between unspecified hip and knee, initial encounter: Secondary | ICD-10-CM | POA: Insufficient documentation

## 2011-08-02 DIAGNOSIS — IMO0001 Reserved for inherently not codable concepts without codable children: Secondary | ICD-10-CM | POA: Insufficient documentation

## 2011-08-02 DIAGNOSIS — M6281 Muscle weakness (generalized): Secondary | ICD-10-CM | POA: Insufficient documentation

## 2011-08-02 DIAGNOSIS — R5381 Other malaise: Secondary | ICD-10-CM | POA: Insufficient documentation

## 2011-08-02 DIAGNOSIS — R269 Unspecified abnormalities of gait and mobility: Secondary | ICD-10-CM | POA: Insufficient documentation

## 2011-08-17 ENCOUNTER — Ambulatory Visit: Payer: Medicare Other | Attending: Orthopedic Surgery | Admitting: Physical Therapy

## 2011-08-17 DIAGNOSIS — R5381 Other malaise: Secondary | ICD-10-CM | POA: Insufficient documentation

## 2011-08-17 DIAGNOSIS — IMO0001 Reserved for inherently not codable concepts without codable children: Secondary | ICD-10-CM | POA: Insufficient documentation

## 2011-08-17 DIAGNOSIS — R269 Unspecified abnormalities of gait and mobility: Secondary | ICD-10-CM | POA: Insufficient documentation

## 2011-08-17 DIAGNOSIS — S78119A Complete traumatic amputation at level between unspecified hip and knee, initial encounter: Secondary | ICD-10-CM | POA: Insufficient documentation

## 2011-08-17 DIAGNOSIS — M6281 Muscle weakness (generalized): Secondary | ICD-10-CM | POA: Insufficient documentation

## 2011-08-19 ENCOUNTER — Ambulatory Visit: Payer: Medicare Other | Admitting: Physical Therapy

## 2011-08-24 ENCOUNTER — Ambulatory Visit: Payer: Medicare Other | Admitting: Physical Therapy

## 2011-08-26 ENCOUNTER — Ambulatory Visit: Payer: Medicare Other | Admitting: Physical Therapy

## 2011-08-26 ENCOUNTER — Encounter: Payer: Self-pay | Admitting: Physical Therapy

## 2011-08-31 ENCOUNTER — Ambulatory Visit: Payer: Medicare Other | Admitting: Physical Therapy

## 2011-09-02 ENCOUNTER — Ambulatory Visit: Payer: Medicare Other | Admitting: Physical Therapy

## 2011-09-07 ENCOUNTER — Ambulatory Visit: Payer: Medicare Other | Admitting: Physical Therapy

## 2011-09-09 ENCOUNTER — Ambulatory Visit: Payer: Medicare Other | Attending: Orthopedic Surgery | Admitting: Physical Therapy

## 2011-09-09 DIAGNOSIS — IMO0001 Reserved for inherently not codable concepts without codable children: Secondary | ICD-10-CM | POA: Insufficient documentation

## 2011-09-09 DIAGNOSIS — R269 Unspecified abnormalities of gait and mobility: Secondary | ICD-10-CM | POA: Insufficient documentation

## 2011-09-09 DIAGNOSIS — M6281 Muscle weakness (generalized): Secondary | ICD-10-CM | POA: Insufficient documentation

## 2011-09-09 DIAGNOSIS — R5381 Other malaise: Secondary | ICD-10-CM | POA: Insufficient documentation

## 2011-09-09 DIAGNOSIS — S78119A Complete traumatic amputation at level between unspecified hip and knee, initial encounter: Secondary | ICD-10-CM | POA: Insufficient documentation

## 2011-09-13 ENCOUNTER — Encounter: Payer: Self-pay | Admitting: Physical Therapy

## 2011-09-13 ENCOUNTER — Encounter (HOSPITAL_COMMUNITY): Payer: Self-pay | Admitting: Dentistry

## 2011-09-13 ENCOUNTER — Ambulatory Visit (HOSPITAL_COMMUNITY): Payer: Self-pay | Admitting: Dentistry

## 2011-09-13 VITALS — BP 114/65 | HR 82

## 2011-09-13 DIAGNOSIS — K1370 Unspecified lesions of oral mucosa: Secondary | ICD-10-CM

## 2011-09-13 DIAGNOSIS — K08109 Complete loss of teeth, unspecified cause, unspecified class: Secondary | ICD-10-CM

## 2011-09-13 DIAGNOSIS — Z463 Encounter for fitting and adjustment of dental prosthetic device: Secondary | ICD-10-CM

## 2011-09-13 DIAGNOSIS — K062 Gingival and edentulous alveolar ridge lesions associated with trauma: Secondary | ICD-10-CM

## 2011-09-13 NOTE — Progress Notes (Signed)
09/13/2011 Patient:            [name] Date of Birth:  25-Mar-1945 MRN:                [id number]  BP: 154/68             P:                T: 97.5  [name] is a 66 year old female who presents for periodic oral evaluation and denture adjustment. Upper and lower complete dentures were inserted in November of 2012.   Past Medical History  Diagnosis Date  . Emphysema   . Coronary artery disease   . Atrial fibrillation   . COPD (chronic obstructive pulmonary disease)   . History of atrial fibrillation     S/pP Ablation  . Asthma   . Chronic kidney disease     HX OF UTI  . Arthritis   . Osteoarthritis   . Rheumatoid arthritis   . Anemia   . B12 deficiency anemia   . Stroke     slight stroke in 2009-no deficits  . Neuromuscular disorder     hx of right quadricep rupture  . DJD (degenerative joint disease)   . History of benign bladder tumor   . DVT (deep venous thrombosis)   . Blood transfusion     2009    Allergies  Allergen Reactions  . Sulfa Antibiotics Anaphylaxis and Shortness Of Breath  . Avelox (Moxifloxacin Hcl In Nacl) Diarrhea and Nausea And Vomiting  . Robaxin (Methocarbamol) Nausea And Vomiting  . Penicillins Hives and Rash   Current Outpatient Prescriptions on File Prior to Visit  Medication Sig Dispense Refill  . amiodarone (PACERONE) 200 MG tablet Take 200 mg by mouth daily. Takes in evening      . dabigatran (PRADAXA) 150 MG CAPS Take 1 capsule (150 mg total) by mouth every 12 (twelve) hours.      . digoxin (LANOXIN) 0.125 MG tablet Take 0.125 mg by mouth at bedtime.      . ferrous sulfate 325 (65 FE) MG tablet Take 1 tablet (325 mg total) by mouth 3 (three) times daily after meals.      . gabapentin (NEURONTIN) 300 MG capsule Take 300 mg by mouth 3 (three) times daily.      . montelukast (SINGULAIR) 10 MG tablet Take 10 mg by mouth at bedtime.      Marland Kitchen omeprazole (PRILOSEC) 40 MG capsule Take 40 mg by mouth daily. Takes in evening        . senna-docusate (SENOKOT-S) 8.6-50 MG per tablet Take 1 tablet by mouth at bedtime as needed for constipation.      . traZODone (DESYREL) 50 MG tablet Take 1 tablet (50 mg total) by mouth at bedtime as needed for sleep.      . traZODone (DESYREL) 50 MG tablet Take 50 mg by mouth at bedtime.       Subjective: Patient is complaining of difficulty wearing lower denture and some denture irritation at times to the lower left lingual area. . Exam: There is no signs of irritation or erythema. Patient is edentulous. The tissues looked healthy. There is no evidence of exposed bone. The patient has excess of maxillary tuberosities as before.   The patient has a mandibular right and mandibular left lingual undercuts as before.  Procedure: Pressure indicating paste was applied to upper and lower complete dentures. Adjustments made as needed. [place]. Occlusion evaluated and adjustments made  as needed for centric relation and protrusive strokes. Patient accepts results and indicates that the dentures " feel much better". We discussed possible upper and lower complete denture reline procedures. Patient currently not interested in pursuing this at this time.  Patient will call if she changes her mind. Patient is aware that Medicaid prior approval will need to be obtained prior to the reline procedures. Patient to keep dentures out if sore spots develop. Use salt water rinses as needed to aid healing. Return to clinic as scheduled for denture evaluation and adjustment. Call if problems arise before then. Patient dismissed in stable condition. Dr. Kristin Bruins

## 2011-09-14 ENCOUNTER — Ambulatory Visit: Payer: Medicare Other | Admitting: *Deleted

## 2011-09-16 ENCOUNTER — Ambulatory Visit: Payer: Medicare Other | Admitting: Physical Therapy

## 2011-09-20 ENCOUNTER — Ambulatory Visit: Payer: Medicare Other | Admitting: Physical Therapy

## 2011-09-23 ENCOUNTER — Ambulatory Visit: Payer: Medicare Other | Admitting: Physical Therapy

## 2011-09-27 ENCOUNTER — Ambulatory Visit: Payer: Medicare Other | Admitting: Physical Therapy

## 2011-09-30 ENCOUNTER — Ambulatory Visit: Payer: Medicare Other | Admitting: Physical Therapy

## 2011-10-04 ENCOUNTER — Ambulatory Visit: Payer: Medicare Other | Admitting: Physical Therapy

## 2011-10-12 ENCOUNTER — Encounter (HOSPITAL_COMMUNITY): Payer: Self-pay | Admitting: *Deleted

## 2011-10-12 ENCOUNTER — Ambulatory Visit: Payer: Medicare Other | Attending: Orthopedic Surgery | Admitting: Physical Therapy

## 2011-10-12 ENCOUNTER — Emergency Department (INDEPENDENT_AMBULATORY_CARE_PROVIDER_SITE_OTHER)
Admission: EM | Admit: 2011-10-12 | Discharge: 2011-10-12 | Disposition: A | Discharge status: Home / Self Care | Payer: Medicare Other | Source: Home / Self Care | Attending: Family Medicine | Admitting: Family Medicine

## 2011-10-12 DIAGNOSIS — N390 Urinary tract infection, site not specified: Secondary | ICD-10-CM

## 2011-10-12 DIAGNOSIS — M6281 Muscle weakness (generalized): Secondary | ICD-10-CM | POA: Insufficient documentation

## 2011-10-12 DIAGNOSIS — S78119A Complete traumatic amputation at level between unspecified hip and knee, initial encounter: Secondary | ICD-10-CM | POA: Insufficient documentation

## 2011-10-12 DIAGNOSIS — R269 Unspecified abnormalities of gait and mobility: Secondary | ICD-10-CM | POA: Insufficient documentation

## 2011-10-12 DIAGNOSIS — IMO0001 Reserved for inherently not codable concepts without codable children: Secondary | ICD-10-CM | POA: Insufficient documentation

## 2011-10-12 DIAGNOSIS — R5381 Other malaise: Secondary | ICD-10-CM | POA: Insufficient documentation

## 2011-10-12 LAB — POCT URINALYSIS DIP (DEVICE)
Bilirubin Urine: NEGATIVE
Ketones, ur: NEGATIVE mg/dL
Leukocytes, UA: NEGATIVE
Nitrite: POSITIVE — AB
Protein, ur: NEGATIVE mg/dL

## 2011-10-12 MED ORDER — NITROFURANTOIN MONOHYD MACRO 100 MG PO CAPS: 100.0000 mg | ORAL_CAPSULE | Freq: Two times a day (BID) | ORAL | Status: AC

## 2011-10-12 NOTE — ED Provider Notes (Signed)
History     CSN: [id number]  Arrival date & time 10/12/11  1401   First MD Initiated Contact with Patient 10/12/11 1403      Chief Complaint  Patient presents with  . Urinary Tract Infection    (Consider location/radiation/quality/duration/timing/severity/associated sxs/prior treatment) Patient is a 66 y.o. female presenting with urinary tract infection. The history is provided by the patient.  Urinary Tract Infection This is a recurrent problem. The current episode started yesterday. The problem has been resolved. Pertinent negatives include no abdominal pain.    Past Medical History  Diagnosis Date  . Emphysema   . Coronary artery disease   . Atrial fibrillation   . COPD (chronic obstructive pulmonary disease)   . History of atrial fibrillation     S/pP Ablation  . Asthma   . Chronic kidney disease     HX OF UTI  . Arthritis   . Osteoarthritis   . Rheumatoid arthritis   . Anemia   . B12 deficiency anemia   . Stroke     slight stroke in 2009-no deficits  . Neuromuscular disorder     hx of right quadricep rupture  . DJD (degenerative joint disease)   . History of benign bladder tumor   . DVT (deep venous thrombosis)   . Blood transfusion     2009     Past Surgical History  Procedure Date  . Replacement total knee   . Cardiac electrophysiology mapping and ablation   . Cholecystectomy 1996  . Cataract extraction, bilateral   . Hematoma evacuation 2009    From right groin  . Joint replacement     Left-1996, Right-1995  . Multiple tooth extractions 06/30/2010  . Trigger finger release 2011  . Amputation 12/17/2010    Procedure: AMPUTATION ABOVE KNEE;  Surgeon: [name];  Location: [facility];  Service: Orthopedics;  Laterality: Right;  . Application of wound vac 12/17/2010    Procedure: APPLICATION OF WOUND VAC;  Surgeon: [name];  Location: [facility];  Service: Orthopedics;  Laterality: Right;  wound Vac applied @ 2045 By Luna Fuse  . Amputation 04/27/2011      Procedure: AMPUTATION ABOVE KNEE;  Surgeon: [name], MD;  Location: [facility];  Service: Orthopedics;  Laterality: Right;  Revision Right above knee amputation.    Family History  Problem Relation Age of Onset  . Achondroplasia Father   . Congenital heart disease Mother   . Hypertension Mother   . Hypertension Sister     History  Substance Use Topics  . Smoking status: Former Smoker -- 1.5 packs/day for 12 years    Types: Cigarettes    Quit date: 02/09/1996  . Smokeless tobacco: Never Used  . Alcohol Use: No    OB History    Grav Para Term Preterm Abortions TAB SAB Ect Mult Living                  Review of Systems  Constitutional: Negative.   Gastrointestinal: Negative.  Negative for abdominal pain.  Genitourinary: Positive for frequency. Negative for dysuria, urgency, hematuria, vaginal pain and pelvic pain.    Allergies  Sulfa antibiotics; Avelox; Robaxin; and Penicillins  Home Medications   Current Outpatient Rx  Name Route Sig Dispense Refill  . AMIODARONE HCL 200 MG PO TABS Oral Take 200 mg by mouth daily. Takes in evening    . DABIGATRAN ETEXILATE MESYLATE 150 MG PO CAPS Oral Take 1 capsule (150 mg total) by mouth  every 12 (twelve) hours.    Marland Kitchen DIGOXIN 0.125 MG PO TABS Oral Take 0.125 mg by mouth at bedtime.    Marland Kitchen FERROUS SULFATE 325 (65 FE) MG PO TABS Oral Take 1 tablet (325 mg total) by mouth 3 (three) times daily after meals.    Marland Kitchen MONTELUKAST SODIUM 10 MG PO TABS Oral Take 10 mg by mouth at bedtime.    . OMEPRAZOLE 40 MG PO CPDR Oral Take 40 mg by mouth daily. Takes in evening    . SENNOSIDES-DOCUSATE SODIUM 8.6-50 MG PO TABS Oral Take 1 tablet by mouth at bedtime as needed for constipation.    . TRAZODONE HCL 50 MG PO TABS Oral Take 50 mg by mouth at bedtime.    Marland Kitchen GABAPENTIN 300 MG PO CAPS Oral Take 300 mg by mouth 3 (three) times daily.    Marland Kitchen NITROFURANTOIN MONOHYD MACRO 100 MG PO CAPS Oral Take 1 capsule (100 mg total) by mouth 2 (two) times daily. 10  capsule 0  . TRAZODONE HCL 50 MG PO TABS Oral Take 1 tablet (50 mg total) by mouth at bedtime as needed for sleep.      BP 128/66  Pulse 76  Temp 97.7 F (36.5 C) (Oral)  Resp 16  SpO2 96%  Physical Exam  Nursing note and vitals reviewed. Constitutional: She is oriented to person, place, and time. She appears well-developed and well-nourished.  Abdominal: Soft. Bowel sounds are normal. She exhibits no mass. There is no tenderness. There is no rebound and no guarding.  Neurological: She is alert and oriented to person, place, and time.  Skin: Skin is warm and dry.    ED Course  Procedures (including critical care time)  Labs Reviewed  POCT URINALYSIS DIP (DEVICE) - Abnormal; Notable for the following:    Glucose, UA 100 (*)     Nitrite POSITIVE (*)     All other components within normal limits  URINE CULTURE   No results found.   1. UTI (lower urinary tract infection)       MDM  U/a neg         Linna Hoff, MD 10/12/11 [phone]

## 2011-10-12 NOTE — ED Notes (Signed)
Pt reports frequent urination and pain since last night.

## 2011-10-13 LAB — URINE CULTURE
Colony Count: NO GROWTH
Culture: NO GROWTH

## 2011-10-14 ENCOUNTER — Ambulatory Visit: Payer: Medicare Other | Admitting: Physical Therapy

## 2011-10-15 ENCOUNTER — Telehealth (HOSPITAL_COMMUNITY): Payer: Self-pay | Admitting: *Deleted

## 2011-10-15 NOTE — ED Notes (Signed)
Pt. called on VM and said she had rash and itching on her sides, armpits and it is spreading.  She took 2 Benadryl @ 0400 and has stopped the medication.  She asked what she should do?  Pt. called back and I told her I would ask the doctor and call her back.  Discussed with Dr. Lorenz Coaster.  I showed him the Urine culture: No growth.  He said she does not need any more antibiotics.  She should continue the Benadryl 1-2 every for hours for the rash and itching. It should get out of her system in 48 hrs. I called pt. back and gave her this information and she voiced understanding. Vassie Moselle 10/15/2011

## 2011-10-19 ENCOUNTER — Encounter: Payer: Self-pay | Admitting: Physical Therapy

## 2011-10-21 ENCOUNTER — Ambulatory Visit: Payer: Medicare Other | Admitting: Physical Therapy

## 2011-10-26 ENCOUNTER — Ambulatory Visit: Payer: Medicare Other | Admitting: Physical Therapy

## 2011-10-28 ENCOUNTER — Ambulatory Visit: Payer: Medicare Other | Admitting: Physical Therapy

## 2011-11-01 ENCOUNTER — Ambulatory Visit: Payer: Medicare Other | Admitting: Physical Therapy

## 2011-11-02 ENCOUNTER — Encounter: Payer: Self-pay | Admitting: Physical Therapy

## 2011-11-04 ENCOUNTER — Ambulatory Visit: Payer: Medicare Other | Admitting: Physical Therapy

## 2011-11-09 ENCOUNTER — Ambulatory Visit: Payer: Medicare Other | Attending: Orthopedic Surgery | Admitting: Physical Therapy

## 2011-11-09 DIAGNOSIS — R269 Unspecified abnormalities of gait and mobility: Secondary | ICD-10-CM | POA: Insufficient documentation

## 2011-11-09 DIAGNOSIS — M6281 Muscle weakness (generalized): Secondary | ICD-10-CM | POA: Insufficient documentation

## 2011-11-09 DIAGNOSIS — IMO0001 Reserved for inherently not codable concepts without codable children: Secondary | ICD-10-CM | POA: Insufficient documentation

## 2011-11-09 DIAGNOSIS — S78119A Complete traumatic amputation at level between unspecified hip and knee, initial encounter: Secondary | ICD-10-CM | POA: Insufficient documentation

## 2011-11-09 DIAGNOSIS — R5381 Other malaise: Secondary | ICD-10-CM | POA: Insufficient documentation

## 2011-11-10 ENCOUNTER — Ambulatory Visit: Payer: Medicare Other | Admitting: Physical Therapy

## 2011-11-16 ENCOUNTER — Ambulatory Visit: Payer: Medicare Other | Admitting: Physical Therapy

## 2011-11-18 ENCOUNTER — Ambulatory Visit: Payer: Medicare Other | Admitting: Physical Therapy

## 2011-11-23 ENCOUNTER — Ambulatory Visit: Payer: Medicare Other | Admitting: Physical Therapy

## 2011-11-25 ENCOUNTER — Ambulatory Visit: Payer: Medicare Other | Admitting: Physical Therapy

## 2011-12-07 ENCOUNTER — Encounter: Payer: Self-pay | Admitting: Physical Therapy

## 2011-12-09 ENCOUNTER — Encounter: Payer: Self-pay | Admitting: Physical Therapy

## 2012-02-15 ENCOUNTER — Ambulatory Visit: Payer: Medicare Other | Admitting: Physical Therapy

## 2012-02-16 ENCOUNTER — Encounter (HOSPITAL_COMMUNITY): Payer: Self-pay | Admitting: Pharmacy Technician

## 2012-02-17 ENCOUNTER — Ambulatory Visit: Payer: Medicare Other | Attending: Orthopedic Surgery | Admitting: Physical Therapy

## 2012-02-17 DIAGNOSIS — S78119A Complete traumatic amputation at level between unspecified hip and knee, initial encounter: Secondary | ICD-10-CM | POA: Insufficient documentation

## 2012-02-17 DIAGNOSIS — IMO0001 Reserved for inherently not codable concepts without codable children: Secondary | ICD-10-CM | POA: Insufficient documentation

## 2012-02-17 DIAGNOSIS — R5381 Other malaise: Secondary | ICD-10-CM | POA: Insufficient documentation

## 2012-02-17 DIAGNOSIS — M6281 Muscle weakness (generalized): Secondary | ICD-10-CM | POA: Insufficient documentation

## 2012-02-17 DIAGNOSIS — R269 Unspecified abnormalities of gait and mobility: Secondary | ICD-10-CM | POA: Insufficient documentation

## 2012-02-19 IMAGING — CR DG CHEST 1V PORT
1 series · 1 of 1 positions shown · non-contrast
Comparison: None

CLINICAL DATA: PICC placement

PORTABLE CHEST - 1 VIEW

[AP]
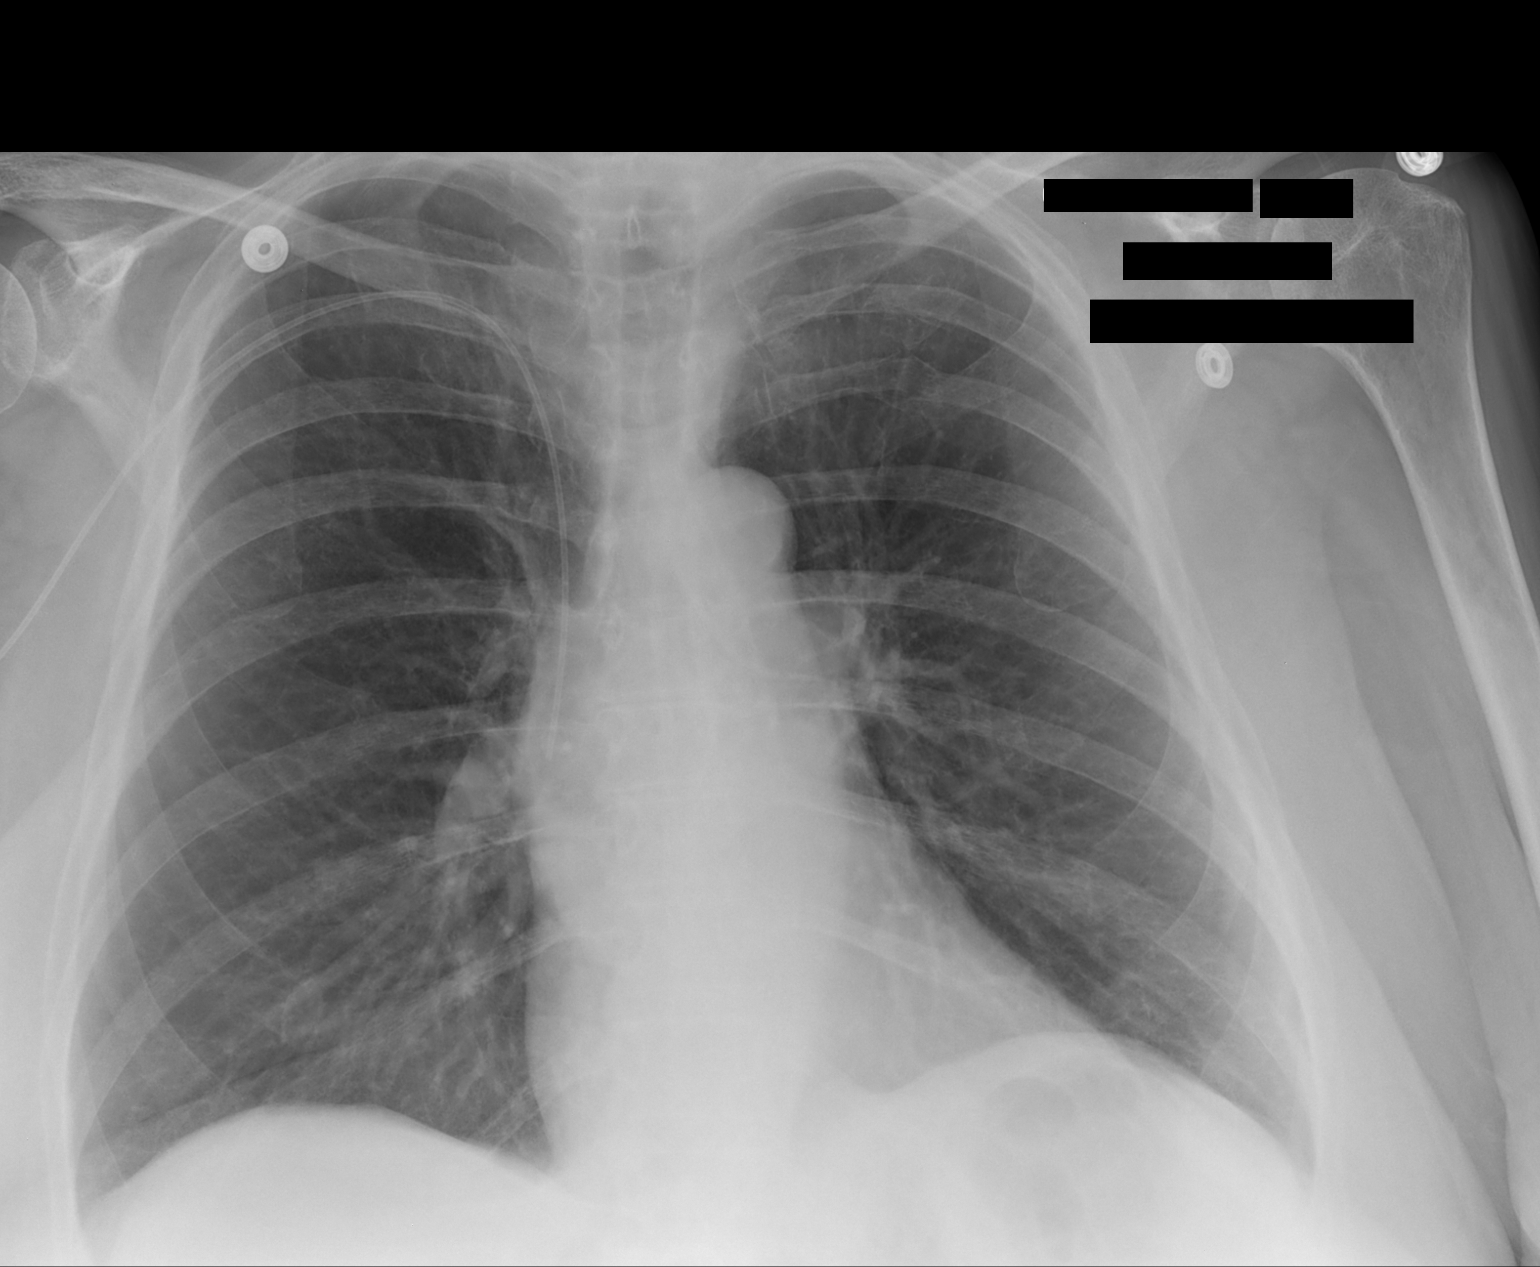

[1 of 1 positions shown; findings below may reference images not displayed]

FINDINGS: Right arm PICC has its tip the SVC 2 cm above the right
atrium.  The lungs are clear.  The vascularity is normal.  No
effusions.  Heart size is normal.
IMPRESSION: PICC line well positioned with its tip in the SVC 2 cm above the
right atrium.

## 2012-02-21 ENCOUNTER — Encounter (HOSPITAL_COMMUNITY): Payer: Self-pay

## 2012-02-21 ENCOUNTER — Encounter (HOSPITAL_COMMUNITY)
Admission: RE | Admit: 2012-02-21 | Discharge: 2012-02-21 | Disposition: A | Discharge status: Home / Self Care | Payer: Medicare Other | Source: Ambulatory Visit | Attending: Orthopedic Surgery | Admitting: Orthopedic Surgery

## 2012-02-21 ENCOUNTER — Other Ambulatory Visit: Payer: Self-pay

## 2012-02-21 HISTORY — DX: Gastro-esophageal reflux disease without esophagitis: K21.9

## 2012-02-21 LAB — URINALYSIS, ROUTINE W REFLEX MICROSCOPIC
Leukocytes, UA: NEGATIVE
Nitrite: NEGATIVE
Protein, ur: NEGATIVE mg/dL
Specific Gravity, Urine: 1.008 (ref 1.005–1.030)
Urobilinogen, UA: 0.2 mg/dL (ref 0.0–1.0)

## 2012-02-21 LAB — CBC
Hemoglobin: 13.1 g/dL (ref 12.0–15.0)
Platelets: 252 10*3/uL (ref 150–400)
RBC: 3.5 MIL/uL — ABNORMAL LOW (ref 3.87–5.11)
WBC: 5.9 10*3/uL (ref 4.0–10.5)

## 2012-02-21 LAB — BASIC METABOLIC PANEL
CO2: 27 mEq/L (ref 19–32)
Calcium: 8.7 mg/dL (ref 8.4–10.5)
GFR calc non Af Amer: 85 mL/min — ABNORMAL LOW (ref >90)
Potassium: 3.9 mEq/L (ref 3.5–5.1)
Sodium: 130 mEq/L — ABNORMAL LOW (ref 135–145)

## 2012-02-21 LAB — PROTIME-INR: Prothrombin Time: 14.2 seconds (ref 11.6–15.2)

## 2012-02-21 LAB — APTT: aPTT: 51 seconds — ABNORMAL HIGH (ref 24–37)

## 2012-02-21 LAB — SURGICAL PCR SCREEN: MRSA, PCR: NEGATIVE

## 2012-02-21 NOTE — Patient Instructions (Signed)
20 TERRIONNA BRIDWELL  02/21/2012   Your procedure is scheduled on: 02/28/12  Report to [facility] at 0700 AM.  Call this number if you have problems the morning of surgery [phone]: [phone]   Remember:   Do not eat food or drink liquids After Midnight.     Take these medicines the morning of surgery with A SIP OF WATER: no medications needed   Do not wear jewelry, make-up or nail polish.  Do not wear lotions, powders, or perfumes. You may wear deodorant.  Do not shave 48 hours prior to surgery. Men may shave face and neck.  Do not bring valuables to the hospital.  Contacts, dentures or bridgework may not be worn into surgery.  Leave suitcase in the car. After surgery it may be brought to your room.  For patients admitted to the hospital, checkout time is 11:00 AM the day of discharge.    Please read over the following fact sheets that you were given: MRSA Information, blood fact sheet, incentive spirometer fact sheet  Birdie Sons, RN  pre op nurse call if needed [phone]    FAILURE TO FOLLOW THESE INSTRUCTIONS MAY RESULT IN CANCELLATION OF YOUR SURGERY   Patient Signature: ___________________________________________

## 2012-02-21 NOTE — Progress Notes (Signed)
Medical record release on chart, chest x-ray 12/09/11 on chart, last office visit Dr. Ysidro Evert 02/10/11 on chart, EKG 10/19/11 on chart- will repeat because cardioversion done for a-fib., last cardiology office visit note 12/06/11 Dr. Chales Abrahams on chart.

## 2012-02-21 NOTE — Progress Notes (Signed)
Abnormal PTT results routed to Dr. Charlann Boxer via [facility]

## 2012-02-23 NOTE — H&P (Signed)
[name] is an 67 y.o. female.    Chief Complaint: Right stump pain S/P right AKA  HPI: Pt is a 67 y.o. female complaining of right stump pain.  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 She has been fitted for a prosthetic leg, but because of pain she hasn't been able to comfortable wear it. Various options of have been discussed with Dr. Charlann Boxer. At this time it has been decided to revise the scar of the distal right stump. An attempt will be made to find the nerves and excise them to help with pain. She states that she still has excess tissue in the area of the upper thigh, at this time we will take care of the excision of the nerves and scar revision. After that heals we may address the excess skin with the prosthesis.  Pain had continually increased since the beginning.  Various options are discussed with the patient. Risks, benefits and expectations were discussed with the patient. Patient understand the risks, benefits and expectations and wishes to proceed with surgery.   PCP:  Marylen Ponto, MD  D/C Plans:  Home with HHPT  Post-op Meds:  No Rx given   Tranexamic Acid:   Not to be given - CAD  Decadron:  Not to be given  PMH: Past Medical History  Diagnosis Date  . Emphysema   . Atrial fibrillation   . COPD (chronic obstructive pulmonary disease)   . History of atrial fibrillation     S/pP Ablation  . Asthma   . Arthritis   . Osteoarthritis   . Rheumatoid arthritis   . Anemia   . B12 deficiency anemia   . Stroke     slight stroke in 2009-no deficits  . Neuromuscular disorder     hx of right quadricep rupture  . DJD (degenerative joint disease)   . History of benign bladder tumor   . DVT (deep venous thrombosis)   . Blood transfusion     2009   . Pneumonia     hx of  . GERD (gastroesophageal reflux disease)     PSH: Past Surgical History  Procedure Date  . Replacement total knee   . Cardiac electrophysiology mapping and  ablation   . Cholecystectomy 1996  . Cataract extraction, bilateral   . Hematoma evacuation 2009    From right groin  . Joint replacement     Left-1996, Right-1995  . Multiple tooth extractions 06/30/2010  . Trigger finger release 2011  . Amputation 12/17/2010    Procedure: AMPUTATION ABOVE KNEE;  Surgeon: [name];  Location: [facility];  Service: Orthopedics;  Laterality: Right;  . Application of wound vac 12/17/2010    Procedure: APPLICATION OF WOUND VAC;  Surgeon: [name];  Location: [facility];  Service: Orthopedics;  Laterality: Right;  wound Vac applied @ 2045 By Luna Fuse  . Amputation 04/27/2011    Procedure: AMPUTATION ABOVE KNEE;  Surgeon: [name], MD;  Location: [facility];  Service: Orthopedics;  Laterality: Right;  Revision Right above knee amputation.  . Cardiac catheterization   . Cardioversion     x5 times    Social History:  reports that she quit smoking about 16 years ago. Her smoking use included Cigarettes. She has a 33 pack-year smoking history. She has never used smokeless tobacco. She reports that she does not drink alcohol or use illicit drugs.  Allergies:  Allergies  Allergen Reactions  .  Sulfa Antibiotics Anaphylaxis and Shortness Of Breath  . Avelox (Moxifloxacin Hcl In Nacl) Diarrhea and Nausea And Vomiting  . Penicillins Hives and Rash    Medications: No current facility-administered medications for this encounter.   Current Outpatient Prescriptions  Medication Sig Dispense Refill  . acetaminophen (TYLENOL) 500 MG tablet Take 500 mg by mouth every 6 (six) hours as needed. For pain      . amiodarone (PACERONE) 200 MG tablet Take 200 mg by mouth every morning.      . celecoxib (CELEBREX) 200 MG capsule Take 200 mg by mouth 2 (two) times daily.      . dabigatran (PRADAXA) 150 MG CAPS Take 150 mg by mouth 2 (two) times daily.      Marland Kitchen dimenhyDRINATE (DRAMAMINE) 50 MG tablet Take 50 mg by mouth every 8 (eight) hours as needed. For nausea      .  doxycycline (VIBRA-TABS) 100 MG tablet Take 100 mg by mouth 2 (two) times daily.      . Fluticasone Furoate-Vilanterol (BREO ELLIPTA) 100-25 MCG/INH AEPB Inhale 1 puff into the lungs daily.      Marland Kitchen gabapentin (NEURONTIN) 300 MG capsule Take 300 mg by mouth 2 (two) times daily.      Marland Kitchen HYDROcodone-acetaminophen (NORCO/VICODIN) 5-325 MG per tablet Take 1 tablet by mouth every 6 (six) hours as needed. For pain      . methocarbamol (ROBAXIN) 500 MG tablet Take 500 mg by mouth 2 (two) times daily.      Marland Kitchen omeprazole (PRILOSEC) 20 MG capsule Take 20 mg by mouth daily.        Review of Systems  Constitutional: Negative.   HENT: Positive for tinnitus.   Eyes: Negative.   Respiratory: Positive for cough.   Cardiovascular: Negative.   Gastrointestinal: Positive for heartburn.  Genitourinary: Negative.   Musculoskeletal: Positive for myalgias (muscle spasms, right AKA) and joint pain.  Skin: Negative.   Neurological: Negative.   Endo/Heme/Allergies: Positive for environmental allergies.  Psychiatric/Behavioral: Negative.      Physical Exam  Constitutional: She is oriented to person, place, and time and well-developed, well-nourished, and in no distress.  HENT:  Head: Normocephalic and atraumatic.  Mouth/Throat: Oropharynx is clear and moist. She has dentures.  Eyes: Pupils are equal, round, and reactive to light.  Neck: Neck supple. No JVD present. No tracheal deviation present. No thyromegaly present.  Cardiovascular: Normal rate, regular rhythm, normal heart sounds and intact distal pulses.   Pulmonary/Chest: Effort normal and breath sounds normal. No stridor. No respiratory distress.  Abdominal: There is no tenderness. There is no guarding.  Musculoskeletal:       Legs: Lymphadenopathy:    She has no cervical adenopathy.  Neurological: She is alert and oriented to person, place, and time.  Skin: Skin is warm and dry.  Psychiatric: Affect normal.     Assessment/Plan Assessment:    Right stump pain S/P right AKA  Plan: Patient will undergo a revision of the right stump incision on 02/28/2012 per Dr. Charlann Boxer at [facility]. Risks benefits and expectations were discussed with the patient. Patient understand risks, benefits and expectations and wishes to proceed.   Anastasio Auerbach Daton Szilagyi   PAC  02/23/2012, 4:47 PM

## 2012-02-28 ENCOUNTER — Encounter (HOSPITAL_COMMUNITY): Payer: Self-pay | Admitting: *Deleted

## 2012-02-28 ENCOUNTER — Observation Stay (HOSPITAL_COMMUNITY)
Admission: RE | Admit: 2012-02-28 | Discharge: 2012-02-29 | Disposition: A | Discharge status: Home / Self Care | Payer: Medicare Other | Source: Ambulatory Visit | Attending: Orthopedic Surgery | Admitting: Orthopedic Surgery

## 2012-02-28 ENCOUNTER — Encounter (HOSPITAL_COMMUNITY)
Admission: RE | Disposition: A | Discharge status: Home / Self Care | Payer: Self-pay | Source: Ambulatory Visit | Attending: Orthopedic Surgery

## 2012-02-28 ENCOUNTER — Encounter (HOSPITAL_COMMUNITY): Payer: Self-pay | Admitting: Anesthesiology

## 2012-02-28 ENCOUNTER — Ambulatory Visit (HOSPITAL_COMMUNITY): Payer: Medicare Other | Admitting: Anesthesiology

## 2012-02-28 DIAGNOSIS — Y835 Amputation of limb(s) as the cause of abnormal reaction of the patient, or of later complication, without mention of misadventure at the time of the procedure: Secondary | ICD-10-CM | POA: Insufficient documentation

## 2012-02-28 DIAGNOSIS — S88919A Complete traumatic amputation of unspecified lower leg, level unspecified, initial encounter: Secondary | ICD-10-CM

## 2012-02-28 DIAGNOSIS — Z89619 Acquired absence of unspecified leg above knee: Secondary | ICD-10-CM

## 2012-02-28 DIAGNOSIS — J4489 Other specified chronic obstructive pulmonary disease: Secondary | ICD-10-CM | POA: Insufficient documentation

## 2012-02-28 DIAGNOSIS — Z79899 Other long term (current) drug therapy: Secondary | ICD-10-CM | POA: Insufficient documentation

## 2012-02-28 DIAGNOSIS — M79609 Pain in unspecified limb: Secondary | ICD-10-CM | POA: Insufficient documentation

## 2012-02-28 DIAGNOSIS — K219 Gastro-esophageal reflux disease without esophagitis: Secondary | ICD-10-CM | POA: Insufficient documentation

## 2012-02-28 DIAGNOSIS — I4891 Unspecified atrial fibrillation: Secondary | ICD-10-CM | POA: Insufficient documentation

## 2012-02-28 DIAGNOSIS — J449 Chronic obstructive pulmonary disease, unspecified: Secondary | ICD-10-CM | POA: Insufficient documentation

## 2012-02-28 DIAGNOSIS — T8789 Other complications of amputation stump: Principal | ICD-10-CM | POA: Insufficient documentation

## 2012-02-28 DIAGNOSIS — Z8673 Personal history of transient ischemic attack (TIA), and cerebral infarction without residual deficits: Secondary | ICD-10-CM | POA: Insufficient documentation

## 2012-02-28 DIAGNOSIS — M069 Rheumatoid arthritis, unspecified: Secondary | ICD-10-CM | POA: Insufficient documentation

## 2012-02-28 DIAGNOSIS — D500 Iron deficiency anemia secondary to blood loss (chronic): Secondary | ICD-10-CM

## 2012-02-28 HISTORY — PX: AMPUTATION: SHX166

## 2012-02-28 SURGERY — AMPUTATION BELOW KNEE
Anesthesia: General | Site: Knee | Laterality: Right | Wound class: Clean

## 2012-02-28 MED ORDER — FERROUS SULFATE 325 (65 FE) MG PO TABS
325.0000 mg | ORAL_TABLET | Freq: Three times a day (TID) | ORAL | Status: DC
  Administered 2012-02-28 – 2012-02-29 (×3): 325 mg via ORAL
  Filled 2012-02-28 (×6): qty 1

## 2012-02-28 MED ORDER — ONDANSETRON HCL 4 MG/2ML IJ SOLN
INTRAMUSCULAR | Status: AC
  Filled 2012-02-28: qty 2

## 2012-02-28 MED ORDER — ACETAMINOPHEN 10 MG/ML IV SOLN
INTRAVENOUS | Status: AC
  Filled 2012-02-28: qty 100

## 2012-02-28 MED ORDER — ONDANSETRON HCL 4 MG/2ML IJ SOLN
4.0000 mg | Freq: Once | INTRAMUSCULAR | Status: AC
  Administered 2012-02-28: 4 mg via INTRAVENOUS

## 2012-02-28 MED ORDER — BISACODYL 10 MG RE SUPP: 10.0000 mg | Freq: Every day | RECTAL | Status: DC | PRN

## 2012-02-28 MED ORDER — ONDANSETRON HCL 4 MG PO TABS: 4.0000 mg | ORAL_TABLET | Freq: Four times a day (QID) | ORAL | Status: DC | PRN

## 2012-02-28 MED ORDER — FENTANYL CITRATE 0.05 MG/ML IJ SOLN
INTRAMUSCULAR | Status: DC | PRN
  Administered 2012-02-28: 100 ug via INTRAVENOUS
  Administered 2012-02-28 (×3): 50 ug via INTRAVENOUS
  Administered 2012-02-28: 100 ug via INTRAVENOUS
  Administered 2012-02-28: 50 ug via INTRAVENOUS

## 2012-02-28 MED ORDER — ACETAMINOPHEN 10 MG/ML IV SOLN
INTRAVENOUS | Status: DC | PRN
  Administered 2012-02-28: 1000 mg via INTRAVENOUS

## 2012-02-28 MED ORDER — DABIGATRAN ETEXILATE MESYLATE 150 MG PO CAPS
150.0000 mg | ORAL_CAPSULE | Freq: Two times a day (BID) | ORAL | Status: DC
  Administered 2012-02-29: 150 mg via ORAL
  Filled 2012-02-28 (×3): qty 1

## 2012-02-28 MED ORDER — ZOLPIDEM TARTRATE 5 MG PO TABS: 5.0000 mg | ORAL_TABLET | Freq: Every evening | ORAL | Status: DC | PRN

## 2012-02-28 MED ORDER — MENTHOL 3 MG MT LOZG: 1.0000 | LOZENGE | OROMUCOSAL | Status: DC | PRN

## 2012-02-28 MED ORDER — SODIUM CHLORIDE 0.9 % IV SOLN
INTRAVENOUS | Status: DC
  Administered 2012-02-28: 18:00:00 via INTRAVENOUS
  Filled 2012-02-28 (×8): qty 1000

## 2012-02-28 MED ORDER — PROPOFOL 10 MG/ML IV BOLUS
INTRAVENOUS | Status: DC | PRN
  Administered 2012-02-28: 200 mg via INTRAVENOUS
  Administered 2012-02-28: 80 mg via INTRAVENOUS
  Administered 2012-02-28: 40 mg via INTRAVENOUS

## 2012-02-28 MED ORDER — ONDANSETRON HCL 4 MG/2ML IJ SOLN: 4.0000 mg | Freq: Four times a day (QID) | INTRAMUSCULAR | Status: DC | PRN

## 2012-02-28 MED ORDER — PHENYLEPHRINE HCL 10 MG/ML IJ SOLN
INTRAMUSCULAR | Status: DC | PRN
  Administered 2012-02-28 (×2): 80 ug via INTRAVENOUS

## 2012-02-28 MED ORDER — BUPIVACAINE HCL (PF) 0.25 % IJ SOLN
INTRAMUSCULAR | Status: AC
  Filled 2012-02-28: qty 30

## 2012-02-28 MED ORDER — METOCLOPRAMIDE HCL 5 MG/ML IJ SOLN
INTRAMUSCULAR | Status: DC | PRN
  Administered 2012-02-28: 10 mg via INTRAVENOUS

## 2012-02-28 MED ORDER — DEXAMETHASONE SODIUM PHOSPHATE 4 MG/ML IJ SOLN
INTRAMUSCULAR | Status: DC | PRN
  Administered 2012-02-28: 10 mg via INTRAVENOUS

## 2012-02-28 MED ORDER — ALUM & MAG HYDROXIDE-SIMETH 200-200-20 MG/5ML PO SUSP: 30.0000 mL | ORAL | Status: DC | PRN

## 2012-02-28 MED ORDER — AMIODARONE HCL 200 MG PO TABS
200.0000 mg | ORAL_TABLET | Freq: Every day | ORAL | Status: DC
  Administered 2012-02-28: 200 mg via ORAL
  Filled 2012-02-28 (×2): qty 1

## 2012-02-28 MED ORDER — HYDROMORPHONE HCL PF 1 MG/ML IJ SOLN: 0.5000 mg | INTRAMUSCULAR | Status: DC | PRN

## 2012-02-28 MED ORDER — BUPIVACAINE-EPINEPHRINE PF 0.25-1:200000 % IJ SOLN
INTRAMUSCULAR | Status: DC | PRN
  Administered 2012-02-28: 30 mL

## 2012-02-28 MED ORDER — HYDROMORPHONE HCL PF 1 MG/ML IJ SOLN
INTRAMUSCULAR | Status: AC
  Filled 2012-02-28: qty 1

## 2012-02-28 MED ORDER — GABAPENTIN 300 MG PO CAPS
300.0000 mg | ORAL_CAPSULE | Freq: Two times a day (BID) | ORAL | Status: DC
  Administered 2012-02-28 – 2012-02-29 (×2): 300 mg via ORAL
  Filled 2012-02-28 (×4): qty 1

## 2012-02-28 MED ORDER — PANTOPRAZOLE SODIUM 40 MG PO TBEC
40.0000 mg | DELAYED_RELEASE_TABLET | Freq: Every day | ORAL | Status: DC
  Administered 2012-02-28 – 2012-02-29 (×2): 40 mg via ORAL
  Filled 2012-02-28 (×2): qty 1

## 2012-02-28 MED ORDER — DOCUSATE SODIUM 100 MG PO CAPS
100.0000 mg | ORAL_CAPSULE | Freq: Two times a day (BID) | ORAL | Status: DC
  Administered 2012-02-28: 100 mg via ORAL

## 2012-02-28 MED ORDER — DABIGATRAN ETEXILATE MESYLATE 150 MG PO CAPS
150.0000 mg | ORAL_CAPSULE | Freq: Two times a day (BID) | ORAL | Status: DC
  Filled 2012-02-28 (×2): qty 1

## 2012-02-28 MED ORDER — LIDOCAINE HCL (CARDIAC) 10 MG/ML IV SOLN
INTRAVENOUS | Status: DC | PRN
  Administered 2012-02-28: 80 mg via INTRAVENOUS

## 2012-02-28 MED ORDER — KETOROLAC TROMETHAMINE 30 MG/ML IJ SOLN
INTRAMUSCULAR | Status: AC
  Filled 2012-02-28: qty 1

## 2012-02-28 MED ORDER — DIPHENHYDRAMINE HCL 25 MG PO CAPS: 25.0000 mg | ORAL_CAPSULE | Freq: Four times a day (QID) | ORAL | Status: DC | PRN

## 2012-02-28 MED ORDER — HYDROMORPHONE HCL PF 1 MG/ML IJ SOLN
0.2500 mg | INTRAMUSCULAR | Status: DC | PRN
  Administered 2012-02-28 (×2): 0.5 mg via INTRAVENOUS

## 2012-02-28 MED ORDER — FLEET ENEMA 7-19 GM/118ML RE ENEM: 1.0000 | ENEMA | Freq: Once | RECTAL | Status: AC | PRN

## 2012-02-28 MED ORDER — DEXTROSE 5 % IV SOLN
500.0000 mg | Freq: Four times a day (QID) | INTRAVENOUS | Status: DC | PRN
  Administered 2012-02-28: 500 mg via INTRAVENOUS
  Filled 2012-02-28: qty 5

## 2012-02-28 MED ORDER — MIDAZOLAM HCL 5 MG/5ML IJ SOLN
INTRAMUSCULAR | Status: DC | PRN
  Administered 2012-02-28: 2 mg via INTRAVENOUS

## 2012-02-28 MED ORDER — METHOCARBAMOL 500 MG PO TABS
500.0000 mg | ORAL_TABLET | Freq: Four times a day (QID) | ORAL | Status: DC | PRN
  Administered 2012-02-29: 500 mg via ORAL
  Filled 2012-02-28: qty 1

## 2012-02-28 MED ORDER — LACTATED RINGERS IV SOLN
INTRAVENOUS | Status: DC
  Administered 2012-02-28: 10:00:00 via INTRAVENOUS
  Administered 2012-02-28: 1000 mL via INTRAVENOUS

## 2012-02-28 MED ORDER — ONDANSETRON HCL 4 MG/2ML IJ SOLN
INTRAMUSCULAR | Status: DC | PRN
  Administered 2012-02-28: 4 mg via INTRAVENOUS

## 2012-02-28 MED ORDER — PROMETHAZINE HCL 25 MG/ML IJ SOLN: 6.2500 mg | INTRAMUSCULAR | Status: DC | PRN

## 2012-02-28 MED ORDER — CLINDAMYCIN PHOSPHATE 900 MG/50ML IV SOLN
900.0000 mg | INTRAVENOUS | Status: AC
  Administered 2012-02-28: 900 mg via INTRAVENOUS
  Filled 2012-02-28: qty 50

## 2012-02-28 MED ORDER — CLINDAMYCIN PHOSPHATE 900 MG/50ML IV SOLN
INTRAVENOUS | Status: AC
  Filled 2012-02-28: qty 50

## 2012-02-28 MED ORDER — HYDROCODONE-ACETAMINOPHEN 7.5-325 MG PO TABS
1.0000 | ORAL_TABLET | ORAL | Status: DC
  Administered 2012-02-28: 2 via ORAL
  Administered 2012-02-28 – 2012-02-29 (×2): 1 via ORAL
  Administered 2012-02-29 (×3): 2 via ORAL
  Filled 2012-02-28 (×6): qty 2

## 2012-02-28 MED ORDER — POLYETHYLENE GLYCOL 3350 17 G PO PACK
17.0000 g | PACK | Freq: Two times a day (BID) | ORAL | Status: DC
  Administered 2012-02-28: 17 g via ORAL

## 2012-02-28 MED ORDER — CLINDAMYCIN PHOSPHATE 600 MG/50ML IV SOLN
600.0000 mg | Freq: Four times a day (QID) | INTRAVENOUS | Status: AC
  Administered 2012-02-28 (×2): 600 mg via INTRAVENOUS
  Filled 2012-02-28 (×2): qty 50

## 2012-02-28 MED ORDER — 0.9 % SODIUM CHLORIDE (POUR BTL) OPTIME
TOPICAL | Status: DC | PRN
  Administered 2012-02-28: 1000 mL

## 2012-02-28 MED ORDER — PHENOL 1.4 % MT LIQD
1.0000 | OROMUCOSAL | Status: DC | PRN
  Filled 2012-02-28: qty 177

## 2012-02-28 SURGICAL SUPPLY — 47 items
BAG ZIPLOCK 12X15 (MISCELLANEOUS) IMPLANT
BNDG COHESIVE 4X5 TAN STRL (GAUZE/BANDAGES/DRESSINGS) ×2 IMPLANT
CLEANER TIP ELECTROSURG 2X2 (MISCELLANEOUS) ×2 IMPLANT
CLOTH BEACON ORANGE TIMEOUT ST (SAFETY) ×2 IMPLANT
CUFF TOURN SGL QUICK 34 (TOURNIQUET CUFF) ×1
CUFF TRNQT CYL 34X4X40X1 (TOURNIQUET CUFF) ×1 IMPLANT
DRAPE U-SHAPE 47X51 STRL (DRAPES) ×2 IMPLANT
DRSG MEPILEX BORDER 4X8 (GAUZE/BANDAGES/DRESSINGS) ×2 IMPLANT
DRSG PAD ABDOMINAL 8X10 ST (GAUZE/BANDAGES/DRESSINGS) ×2 IMPLANT
DURAPREP 26ML APPLICATOR (WOUND CARE) ×2 IMPLANT
ELECT REM PT RETURN 9FT ADLT (ELECTROSURGICAL) ×2
ELECTRODE REM PT RTRN 9FT ADLT (ELECTROSURGICAL) ×1 IMPLANT
GLOVE BIOGEL PI IND STRL 7.5 (GLOVE) ×1 IMPLANT
GLOVE BIOGEL PI IND STRL 8 (GLOVE) ×1 IMPLANT
GLOVE BIOGEL PI INDICATOR 7.5 (GLOVE) ×1
GLOVE BIOGEL PI INDICATOR 8 (GLOVE) ×1
GLOVE ECLIPSE 8.0 STRL XLNG CF (GLOVE) IMPLANT
GLOVE ORTHO TXT STRL SZ7.5 (GLOVE) ×4 IMPLANT
GLOVE SURG ORTHO 8.0 STRL STRW (GLOVE) ×2 IMPLANT
GOWN BRE IMP PREV XXLGXLNG (GOWN DISPOSABLE) ×4 IMPLANT
GOWN STRL NON-REIN LRG LVL3 (GOWN DISPOSABLE) ×2 IMPLANT
KIT BASIN OR (CUSTOM PROCEDURE TRAY) ×2 IMPLANT
MANIFOLD NEPTUNE II (INSTRUMENTS) ×2 IMPLANT
NS IRRIG 1000ML POUR BTL (IV SOLUTION) ×2 IMPLANT
PACK LOWER EXTREMITY WL (CUSTOM PROCEDURE TRAY) ×2 IMPLANT
PAD CAST 4YDX4 CTTN HI CHSV (CAST SUPPLIES) IMPLANT
PADDING CAST COTTON 4X4 STRL (CAST SUPPLIES)
POSITIONER SURGICAL ARM (MISCELLANEOUS) ×2 IMPLANT
SOL PREP POV-IOD 16OZ 10% (MISCELLANEOUS) ×2 IMPLANT
SOL PREP PROV IODINE SCRUB 4OZ (MISCELLANEOUS) IMPLANT
SPONGE GAUZE 4X4 12PLY (GAUZE/BANDAGES/DRESSINGS) ×2 IMPLANT
SPONGE LAP 18X18 X RAY DECT (DISPOSABLE) ×4 IMPLANT
STAPLER VISISTAT 35W (STAPLE) ×2 IMPLANT
STOCKINETTE 8 INCH (MISCELLANEOUS) ×2 IMPLANT
SUT ETHILON 2 0 PSLX (SUTURE) IMPLANT
SUT PROLENE 3 0 PS 2 (SUTURE) IMPLANT
SUT SILK 2 0 (SUTURE) ×1
SUT SILK 2-0 18XBRD TIE 12 (SUTURE) ×1 IMPLANT
SUT SILK 3 0 (SUTURE)
SUT SILK 3-0 18XBRD TIE 12 (SUTURE) IMPLANT
SUT VIC AB 1 CT1 27 (SUTURE) ×1
SUT VIC AB 1 CT1 27XBRD ANTBC (SUTURE) ×1 IMPLANT
SUT VIC AB 2-0 CT1 27 (SUTURE) ×1
SUT VIC AB 2-0 CT1 TAPERPNT 27 (SUTURE) ×1 IMPLANT
TAPE CLOTH SOFT 2X10 (GAUZE/BANDAGES/DRESSINGS) ×2 IMPLANT
TOWEL OR 17X26 10 PK STRL BLUE (TOWEL DISPOSABLE) ×4 IMPLANT
WATER STERILE IRR 1500ML POUR (IV SOLUTION) ×2 IMPLANT

## 2012-02-28 NOTE — Transfer of Care (Signed)
Immediate Anesthesia Transfer of Care Note  Patient: [name]  Procedure(s) Performed: Procedure(s) (LRB) with comments: AMPUTATION BELOW KNEE (Right) - SCAR REVISION  Patient Location: PACU  Anesthesia Type:General  Level of Consciousness: awake, alert , oriented and patient cooperative  Airway & Oxygen Therapy: Patient Spontanous Breathing and Patient connected to face mask oxygen  Post-op Assessment: Report given to PACU RN, Post -op Vital signs reviewed and stable and Patient moving all extremities X 4  Post vital signs: stable  Complications: No apparent anesthesia complications

## 2012-02-28 NOTE — Anesthesia Preprocedure Evaluation (Addendum)
Anesthesia Evaluation  Patient identified by MRN, date of birth, ID band Patient awake    Reviewed: Allergy & Precautions, H&P , NPO status , Patient's Chart, lab work & pertinent test results, reviewed documented beta blocker date and time   Airway Mallampati: II TM Distance: >3 FB Neck ROM: full    Dental No notable dental hx.    Pulmonary asthma , pneumonia -, COPD breath sounds clear to auscultation Took inhaler last pm. Does not feel that she wants a nebulizer treatment at this time.  + wheezing      Cardiovascular Exercise Tolerance: Good + dysrhythmias Atrial Fibrillation Rhythm:regular Rate:Normal  ECG: NSR, atrial fibrillation resolved (02/21/12). First degree AV block.   Neuro/Psych  Neuromuscular disease CVA, No Residual Symptoms negative psych ROS   GI/Hepatic negative GI ROS, Neg liver ROS, GERD-  ,  Endo/Other  negative endocrine ROS  Renal/GU negative Renal ROS  negative genitourinary   Musculoskeletal   Abdominal   Peds  Hematology negative hematology ROS (+) anemia ,   Anesthesia Other Findings Pradaxa yesterday.  Reproductive/Obstetrics negative OB ROS                          Anesthesia Physical Anesthesia Plan  ASA: III  Anesthesia Plan: General LMA   Post-op Pain Management:    Induction:   Airway Management Planned:   Additional Equipment:   Intra-op Plan:   Post-operative Plan:   Informed Consent: I have reviewed the patients History and Physical, chart, labs and discussed the procedure including the risks, benefits and alternatives for the proposed anesthesia with the patient or authorized representative who has indicated his/her understanding and acceptance.   Dental Advisory Given  Plan Discussed with: CRNA  Anesthesia Plan Comments:         Anesthesia Quick Evaluation

## 2012-02-28 NOTE — Interval H&P Note (Signed)
History and Physical Interval Note:  02/28/2012 8:39 AM  [name]  has presented today for surgery, with the diagnosis of PAINFUL RIGHT STUMP  The various methods of treatment have been discussed with the patient and family. After consideration of risks, benefits and other options for treatment, the patient has consented to  Procedure(s) (LRB) with comments: AMPUTATION BELOW KNEE (Right) - SCAR REVISION as a surgical intervention .  The patient's history has been reviewed, patient examined, no change in status, stable for surgery.  I have reviewed the patient's chart and labs.  Questions were answered to the patient's satisfaction.     [name]

## 2012-02-28 NOTE — Brief Op Note (Signed)
02/28/2012  10:32 AM  PATIENT:  [name]  67 y.o. female  PRE-OPERATIVE DIAGNOSIS:  PAINFUL RIGHT above the knee amputation STUMP   POST-OPERATIVE DIAGNOSIS:  PAINFUL RIGHT above the knee amputation STUMP related to painful amputated nerve endings  PROCEDURE:  Procedure(s) (LRB) with comments: AMPUTATION BELOW KNEE (Right) - SCAR REVISION, reamputation of 3 identified nerve endings all three in locations she identified as being very sensitive and painful  SURGEON:  Surgeon(s) and Role:    * [name], MD - Primary  PHYSICIAN ASSISTANT: Lanney Gins, PA-C  ANESTHESIA:   general  EBL:  Total I/O In: 1000 [I.V.:1000] Out: -   BLOOD ADMINISTERED:none  DRAINS: none   LOCAL MEDICATIONS USED:  MARCAINE     SPECIMEN:  No Specimen  DISPOSITION OF SPECIMEN:  N/A  COUNTS:  YES  TOURNIQUET:   Total Tourniquet Time Documented: Thigh (Right) - 24 minutes  DICTATION: .Other Dictation: Dictation Number [id number]  PLAN OF CARE: Admit for overnight observation  PATIENT DISPOSITION:  PACU - hemodynamically stable.   Delay start of Pharmacological VTE agent (>24hrs) due to surgical blood loss or risk of bleeding: not applicable

## 2012-02-28 NOTE — Anesthesia Postprocedure Evaluation (Signed)
  Anesthesia Post-op Note  Patient: [name]  Procedure(s) Performed: Procedure(s) (LRB): AMPUTATION BELOW KNEE (Right)  Patient Location: PACU  Anesthesia Type: General  Level of Consciousness: awake and alert   Airway and Oxygen Therapy: Patient Spontanous Breathing  Post-op Pain: mild  Post-op Assessment: Post-op Vital signs reviewed, Patient's Cardiovascular Status Stable, Respiratory Function Stable, Patent Airway and No signs of Nausea or vomiting  Last Vitals:  Filed Vitals:   02/28/12 1115  BP: 109/55  Pulse: 72  Temp:   Resp: 13    Post-op Vital Signs: stable   Complications: No apparent anesthesia complications

## 2012-02-29 ENCOUNTER — Encounter (HOSPITAL_COMMUNITY): Payer: Self-pay | Admitting: Orthopedic Surgery

## 2012-02-29 DIAGNOSIS — S88919A Complete traumatic amputation of unspecified lower leg, level unspecified, initial encounter: Secondary | ICD-10-CM

## 2012-02-29 DIAGNOSIS — D500 Iron deficiency anemia secondary to blood loss (chronic): Secondary | ICD-10-CM

## 2012-02-29 LAB — CBC
HCT: 30.3 % — ABNORMAL LOW (ref 36.0–46.0)
Hemoglobin: 10.5 g/dL — ABNORMAL LOW (ref 12.0–15.0)
MCV: 108.2 fL — ABNORMAL HIGH (ref 78.0–100.0)
Platelets: 182 10*3/uL (ref 150–400)
RBC: 2.8 MIL/uL — ABNORMAL LOW (ref 3.87–5.11)
WBC: 6 10*3/uL (ref 4.0–10.5)

## 2012-02-29 LAB — BASIC METABOLIC PANEL
BUN: 12 mg/dL (ref 6–23)
CO2: 25 mEq/L (ref 19–32)
Chloride: 97 mEq/L (ref 96–112)
Creatinine, Ser: 0.65 mg/dL (ref 0.50–1.10)
Glucose, Bld: 168 mg/dL — ABNORMAL HIGH (ref 70–99)

## 2012-02-29 MED ORDER — DIPHENHYDRAMINE HCL 25 MG PO CAPS: 25.0000 mg | ORAL_CAPSULE | Freq: Four times a day (QID) | ORAL | Status: DC | PRN

## 2012-02-29 MED ORDER — HYDROCODONE-ACETAMINOPHEN 7.5-325 MG PO TABS: 1.0000 | ORAL_TABLET | ORAL | Status: DC | PRN

## 2012-02-29 MED ORDER — POLYETHYLENE GLYCOL 3350 17 G PO PACK: 17.0000 g | PACK | Freq: Two times a day (BID) | ORAL | Status: DC

## 2012-02-29 MED ORDER — DSS 100 MG PO CAPS: 100.0000 mg | ORAL_CAPSULE | Freq: Two times a day (BID) | ORAL | Status: DC

## 2012-02-29 MED ORDER — METHOCARBAMOL 500 MG PO TABS: 500.0000 mg | ORAL_TABLET | Freq: Four times a day (QID) | ORAL | Status: DC | PRN

## 2012-02-29 MED ORDER — FERROUS SULFATE 325 (65 FE) MG PO TABS: 325.0000 mg | ORAL_TABLET | Freq: Three times a day (TID) | ORAL | Status: DC

## 2012-02-29 NOTE — Discharge Summary (Signed)
Physician Discharge Summary  Patient ID: [name] MRN: [id number] DOB/AGE: 09/02/1945 67 y.o.  Admit date: 02/28/2012 Discharge date: 02/29/2012   Procedures:  Procedure(s) (LRB): AMPUTATION BELOW KNEE (Right)  Attending Physician:  Dr. Durene Romans   Admission Diagnoses:   Right stump pain S/P right AKA  Discharge Diagnoses:  Principal Problem:  *S/P right AKA scar debridement Active Problems:  Expected blood loss anemia Emphysema  Atrial fibrillation   COPD   History of atrial fibrillation   Asthma   Arthritis   Osteoarthritis   Rheumatoid arthritis   Anemia   B12 deficiency anemia   Stroke - slight stroke in 2009-no deficits    DJD (degenerative joint disease)   History of benign bladder tumor   DVT (deep venous thrombosis)   Pneumonia - hx of   GERD   HPI:   Pt is a 67 y.o. female complaining of right stump pain. 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 She has been fitted for a prosthetic leg, but because of pain she hasn't been able to comfortable wear it. Various options of have been discussed with Dr. Charlann Boxer. At this time it has been decided to revise the scar of the distal right stump. An attempt will be made to find the nerves and excise them to help with pain. She states that she still has excess tissue in the area of the upper thigh, at this time we will take care of the excision of the nerves and scar revision. After that heals we may address the excess skin with the prosthesis. Pain had continually increased since the beginning. Various options are discussed with the patient. Risks, benefits and expectations were discussed with the patient. Patient understand the risks, benefits and expectations and wishes to proceed with surgery.   PCP: Marylen Ponto, MD   Discharged Condition: good  Hospital Course:  Patient underwent the above stated procedure on 02/28/2012. Patient tolerated the procedure well and brought to the  recovery room in good condition and subsequently to the floor.  POD #1 BP: 92/56 ; Pulse: 71 ; Temp: 98.1 F (36.7 C) ; Resp: 14  IV was changed to a saline lock.  Patient reports pain as mild, pain well controlled. No events throughout the night. Ready to be discharged home. Neurovascular intact, incision: dressing C/D/I, no cellulitis present and compartment soft.   LABS  Basename  02/29/12 0435   HGB  10.5  HCT  30.3    Discharge Exam: General appearance: alert, cooperative and no distress Extremities: no edema, redness or tenderness in the calves or thighs and no ulcers, gangrene or trophic changes  Disposition:   Home or Self Care with follow up in 2 weeks   Follow-up Information    Follow up with [name], MD. Schedule an appointment as soon as possible for a visit in 2 weeks.   Contact information:   [address] [place] [address] [phone]          Discharge Orders    Future Orders Please Complete By Expires   Diet - low sodium heart healthy      Call MD / Call 911      Comments:   If you experience chest pain or shortness of breath, CALL 911 and be transported to the hospital emergency room.  If you develope a fever above 101 F, pus (white drainage) or increased drainage or redness at the wound, or calf  pain, call your surgeon's office.   Discharge instructions      Comments:   Daily dressing changes with 4x4 gauze and tape. Keep the area dry and clean until follow up. Follow up in 2 weeks at [facility]. Call with any questions or concerns.   Constipation Prevention      Comments:   Drink plenty of fluids.  Prune juice may be helpful.  You may use a stool softener, such as Colace (over the counter) 100 mg twice a day.  Use MiraLax (over the counter) for constipation as needed.   Increase activity slowly as tolerated         Discharge Medication List as of 02/29/2012 10:09 AM    START taking these medications   Details    diphenhydrAMINE (BENADRYL) 25 mg capsule Take 1 capsule (25 mg total) by mouth every 6 (six) hours as needed for itching, allergies or sleep., Starting 02/29/2012, Until Discontinued, No Print    docusate sodium 100 MG CAPS Take 100 mg by mouth 2 (two) times daily., Starting 02/29/2012, Until Discontinued, No Print    ferrous sulfate 325 (65 FE) MG tablet Take 1 tablet (325 mg total) by mouth 3 (three) times daily after meals., Starting 02/29/2012, Until Discontinued, No Print    polyethylene glycol (MIRALAX / GLYCOLAX) packet Take 17 g by mouth 2 (two) times daily., Starting 02/29/2012, Until Discontinued, No Print      CONTINUE these medications which have CHANGED   Details  HYDROcodone-acetaminophen (NORCO) 7.5-325 MG per tablet Take 1-2 tablets by mouth every 4 (four) hours as needed for pain., Starting 02/29/2012, Until Discontinued, Print    methocarbamol (ROBAXIN) 500 MG tablet Take 1 tablet (500 mg total) by mouth every 6 (six) hours as needed (muscle spasms)., Starting 02/29/2012, Until Discontinued, Print      CONTINUE these medications which have NOT CHANGED   Details  amiodarone (PACERONE) 200 MG tablet Take 200 mg by mouth every morning., Until Discontinued, Historical Med    celecoxib (CELEBREX) 200 MG capsule Take 200 mg by mouth 2 (two) times daily., Until Discontinued, Historical Med    dabigatran (PRADAXA) 150 MG CAPS Take 150 mg by mouth 2 (two) times daily., Until Discontinued, Historical Med    dimenhyDRINATE (DRAMAMINE) 50 MG tablet Take 50 mg by mouth every 8 (eight) hours as needed. For nausea, Until Discontinued, Historical Med    Fluticasone Furoate-Vilanterol (BREO ELLIPTA) 100-25 MCG/INH AEPB Inhale 1 puff into the lungs daily., Until Discontinued, Historical Med    gabapentin (NEURONTIN) 300 MG capsule Take 300 mg by mouth 2 (two) times daily., Until Discontinued, Historical Med    omeprazole (PRILOSEC) 20 MG capsule Take 20 mg by mouth daily., Until  Discontinued, Historical Med    doxycycline (VIBRA-TABS) 100 MG tablet Take 100 mg by mouth 2 (two) times daily., Until Discontinued, Historical Med      STOP taking these medications     acetaminophen (TYLENOL) 500 MG tablet Comments:  Reason for Stopping:       HYDROcodone-acetaminophen (NORCO/VICODIN) 5-325 MG per tablet Comments:  Reason for Stopping:           Signed: Anastasio Auerbach. Imoni Kohen   PAC  02/29/2012, 6:31 PM

## 2012-02-29 NOTE — Op Note (Signed)
[name], [name]          ACCOUNT NO.:  [id number]  MEDICAL RECORD NO.:  [id number]  LOCATION:  1615                         FACILITY:  [facility]  PHYSICIAN:  Madlyn Frankel. Charlann Boxer, M.D.  DATE OF BIRTH:  08/04/1945  DATE OF PROCEDURE:  02/28/2012 DATE OF DISCHARGE:                              OPERATIVE REPORT   PREOPERATIVE DIAGNOSIS:  Painful right above-the-knee amputation stump most likely related to amputated nerve endings.  POSTOPERATIVE DIAGNOSIS:  Painful right above-the-knee amputation stump most likely related to amputated nerve endings.  PROCEDURE:  Revision right above-the-knee amputation stump with re- amputation of 3 identified nerve endings all in locations identified by the patient preoperatively was painful.  SURGEON:  Madlyn Frankel. Charlann Boxer, MD  ASSISTANT:  Lanney Gins, PA-C.  Note that Mr. Carmon Sails was present for the entirety of the case for preoperative positioning, perioperative management of the operative extremity, and primary wound closure and general facilitation of the case.  ANESTHESIA:  General.  BLOOD LOSS:  Minimal.  TOURNIQUET TIME:  24 minutes at 200 mmHg.  COMPLICATIONS:  None.  DRAINS:  None.  SPECIMENS:  None.  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  She had been working with physical therapy to get adjusted to prosthesis and had persistent pain, hypersensitivity over several areas with failure to respond to conservative measures including medications.  She at this point wished to proceed with more definitive measures as the pain was quite significant and irritating her attempts at progressing with therapy.  The risks of recurrence of pain and the potential diagnosis, risk of infection were all reviewed.  Consent was obtained for benefit of pain relief.  PROCEDURE IN DETAIL:   The patient was brought to the operative theater. Once adequate anesthesia, preoperative antibiotics, clindamycin administered, she was positioned supine with the right thigh tourniquet placed.  The right lower extremity was then prepped and draped in a sterile fashion.  Time-out was performed identifying the patient, planned procedure, and extremity.  I demarcated out the incision on her old fishmouth incision on the stump.  I had preoperatively identified the location that she was most painful.  These were used as skin landmarks.  With the time-out had been performed and completed, the leg was just elevated and the tourniquet elevated to 200 mmHg with Esmarch utilization.  The old incision was incised.  Soft tissue planes were created.  Then by identifying where I had marked on her skin from middle to the lateral I was able to identify nerve tissue at each of these levels with a bulbous appearance at the end trapped in scar tissue.  Once I was able to identify this area, I gently retracted it and incised it sharply with a knife to allow for further retraction of soft tissues.  I did this first medially.  I then identified the sciatic nerve.  I did the same to it and then the lateral branch, the location of her pain.  Once all 3 of these were done, hemostasis was obtained as necessary, which was minimal.  I reapproximated the deep fascial layer that  I incised to find the sciatic nerve buried underneath the posterior muscular flap.  This was done with #1 Vicryl.  The remainder of the wound was closed with 2-0 Vicryl and staples on the skin.  The skin was cleaned, dried, and dressed sterilely with a Mepilex dressing.  Note that at the time of fascial closure, I did inject the above-the-knee amputation stump with 30 mL of 0.25% Marcaine with epinephrine.  She was then awoken from anesthesia and brought to the recovery room in stable condition tolerating the procedure  well.     Madlyn Frankel Charlann Boxer, M.D.     MDO/MEDQ  D:  02/28/2012  T:  02/28/2012  Job:  [id number]

## 2012-02-29 NOTE — Progress Notes (Signed)
   Subjective: 1 Day Post-Op Procedure(s) (LRB): AMPUTATION BELOW KNEE (Right)   Patient reports pain as mild, pain well controlled. No events throughout the night. Ready to be discharged home.  Objective:   VITALS:   Filed Vitals:   02/29/12 0627  BP: 92/56  Pulse: 71  Temp: 98.1 F (36.7 C)  Resp: 14    Neurovascular intact Incision: scant drainage No cellulitis present Compartment soft  LABS  Basename 02/29/12 0435  HGB 10.5*  HCT 30.3*  WBC 6.0  PLT 182     Basename 02/29/12 0435  NA 129*  K 4.2  BUN 12  CREATININE 0.65  GLUCOSE 168*     Assessment/Plan: 1 Day Post-Op Procedure(s) (LRB): AMPUTATION BELOW KNEE (Right)  Advance diet D/C IV fluids Discharge home Follow up in 2 weeks at [facility]. Follow up with OLIN,Loyed Wilmes D in 2 weeks.  Contact information:  [facility] [address], [address] [place] [address] [phone]    Expected ABLA  Treated with iron and will observe    Anastasio Auerbach. Rohil Lesch   PAC  02/29/2012, 8:18 AM

## 2012-04-19 HISTORY — PX: TENOSYNOVECTOMY: SHX6110

## 2012-05-16 ENCOUNTER — Ambulatory Visit (INDEPENDENT_AMBULATORY_CARE_PROVIDER_SITE_OTHER): Payer: Medicare Other | Admitting: General Surgery

## 2012-05-16 ENCOUNTER — Encounter (INDEPENDENT_AMBULATORY_CARE_PROVIDER_SITE_OTHER): Payer: Self-pay | Admitting: General Surgery

## 2012-05-16 ENCOUNTER — Ambulatory Visit (INDEPENDENT_AMBULATORY_CARE_PROVIDER_SITE_OTHER): Payer: Self-pay | Admitting: General Surgery

## 2012-05-16 VITALS — BP 120/70 | HR 66 | Temp 97.4°F | Resp 18

## 2012-05-16 DIAGNOSIS — R1031 Right lower quadrant pain: Secondary | ICD-10-CM

## 2012-05-16 DIAGNOSIS — R1032 Left lower quadrant pain: Secondary | ICD-10-CM

## 2012-05-16 NOTE — Progress Notes (Signed)
Patient ID: [name], female   DOB: 08/22/1945, 67 y.o.   MRN: [id number]  Chief Complaint  Patient presents with  . Hernia    HPI [name] is a 67 y.o. female.  She is referred by Dr. Durene Romans for evaluation of possible bilateral inguinal hernias. Her cardiologist is Dr. Constance Haw in [place]. She used to be followed by [facility] family practice --  but states that she has been discharged from their practice.  This patient has numerous comorbidities. Her orthopedic problems go back to bilateral total knee replacements. She had numerous complications on the right and has ultimately had an AK amputation and a revision of the right AK stump. The last revision of the AK stump on the right was 02/28/2012. She has had a cerebrovascular accident, intermittent atrial fibrillation, history of DVT.  Also history of COPD and asthma. Ongoing tobacco abuse. History of laparoscopic cholecystectomy. History of cardiac catheterization and endocardial ablation in [place] by history.  She has never had a hernia problem in the past. The past 2 months she has noticed cramping pain in her left lower quadrant mostly but also to a lesser degree in the right lower quadrant. This occurs when she turns to the right or left. She says she almost feels like she has to push something back in. No nausea or vomiting. No diffuse bone pain. No change in her bowel habits. She is in no distress today.    HPI  Past Medical History  Diagnosis Date  . Emphysema   . Atrial fibrillation   . COPD (chronic obstructive pulmonary disease)   . History of atrial fibrillation     S/pP Ablation  . Asthma   . Arthritis   . Osteoarthritis   . Rheumatoid arthritis   . Anemia   . B12 deficiency anemia   . Stroke     slight stroke in 2009-no deficits  . Neuromuscular disorder     hx of right quadricep rupture  . DJD (degenerative joint disease)   . History of benign bladder tumor   . DVT (deep venous  thrombosis)   . Blood transfusion     2009   . Pneumonia     hx of  . GERD (gastroesophageal reflux disease)     Past Surgical History  Procedure Laterality Date  . Replacement total knee    . Cardiac electrophysiology mapping and ablation    . Cholecystectomy  1996  . Cataract extraction, bilateral    . Hematoma evacuation  2009    From right groin  . Joint replacement      Left-1996, Right-1995  . Multiple tooth extractions  06/30/2010  . Trigger finger release  2011  . Amputation  12/17/2010    Procedure: AMPUTATION ABOVE KNEE;  Surgeon: [name];  Location: [facility];  Service: Orthopedics;  Laterality: Right;  . Application of wound vac  12/17/2010    Procedure: APPLICATION OF WOUND VAC;  Surgeon: [name];  Location: [facility];  Service: Orthopedics;  Laterality: Right;  wound Vac applied @ 2045 By Luna Fuse  . Amputation  04/27/2011    Procedure: AMPUTATION ABOVE KNEE;  Surgeon: [name], MD;  Location: [facility];  Service: Orthopedics;  Laterality: Right;  Revision Right above knee amputation.  . Cardiac catheterization    . Cardioversion      x5 times  . Amputation  02/28/2012    Procedure: AMPUTATION BELOW KNEE;  Surgeon: Molli Hazard  Rosalia Hammers, MD;  Location: [facility];  Service: Orthopedics;  Laterality: Right;  SCAR REVISION    Family History  Problem Relation Age of Onset  . Achondroplasia Father   . Congenital heart disease Mother   . Hypertension Mother   . Hypertension Sister     Social History History  Substance Use Topics  . Smoking status: Former Smoker -- 1.50 packs/day for 22 years    Types: Cigarettes    Quit date: 02/09/1996  . Smokeless tobacco: Never Used  . Alcohol Use: No    Allergies  Allergen Reactions  . Sulfa Antibiotics Anaphylaxis and Shortness Of Breath  . Avelox (Moxifloxacin Hcl In Nacl) Diarrhea and Nausea And Vomiting  . Penicillins Hives and Rash    Current Outpatient Prescriptions  Medication Sig Dispense Refill  . amiodarone  (PACERONE) 200 MG tablet Take 200 mg by mouth every morning.      . celecoxib (CELEBREX) 200 MG capsule Take 200 mg by mouth daily.       . dabigatran (PRADAXA) 150 MG CAPS Take 150 mg by mouth daily.       . Fluticasone Furoate-Vilanterol (BREO ELLIPTA) 100-25 MCG/INH AEPB Inhale 1 puff into the lungs daily.      Marland Kitchen gabapentin (NEURONTIN) 300 MG capsule Take 300 mg by mouth 2 (two) times daily.      Marland Kitchen HYDROcodone-acetaminophen (NORCO) 7.5-325 MG per tablet Take 1-2 tablets by mouth every 4 (four) hours as needed for pain.  100 tablet  0  . methocarbamol (ROBAXIN) 500 MG tablet Take 1 tablet (500 mg total) by mouth every 6 (six) hours as needed (muscle spasms).  50 tablet  0  . omeprazole (PRILOSEC) 20 MG capsule Take 20 mg by mouth daily.      . diphenhydrAMINE (BENADRYL) 25 mg capsule Take 1 capsule (25 mg total) by mouth every 6 (six) hours as needed for itching, allergies or sleep.       No current facility-administered medications for this visit.    Review of Systems Review of Systems  Constitutional: Positive for activity change. Negative for fever, chills and unexpected weight change.  HENT: Negative for hearing loss, congestion, sore throat, trouble swallowing and voice change.   Eyes: Negative for visual disturbance.  Respiratory: Positive for cough. Negative for wheezing.   Cardiovascular: Negative for chest pain, palpitations and leg swelling.  Gastrointestinal: Positive for abdominal pain. Negative for nausea, vomiting, diarrhea, constipation, blood in stool, abdominal distention and anal bleeding.  Genitourinary: Negative for hematuria, vaginal bleeding and difficulty urinating.  Musculoskeletal: Positive for myalgias and arthralgias.  Skin: Negative for rash and wound.  Neurological: Negative for seizures, syncope and headaches.  Hematological: Negative for adenopathy. Does not bruise/bleed easily.  Psychiatric/Behavioral: Negative for confusion.    Blood pressure 120/70,  pulse 66, temperature 97.4 F (36.3 C), temperature source Oral, resp. rate 18, weight 0 lb (0 kg).  Physical Exam Physical Exam  Constitutional: She is oriented to person, place, and time. She appears well-developed and well-nourished. No distress.  Alert. Oriented. Talkative. No distress. Odor of tobacco present.in a wheelchair.  HENT:  Head: Normocephalic and atraumatic.  Nose: Nose normal.  Mouth/Throat: No oropharyngeal exudate.  Eyes: Conjunctivae and EOM are normal. Pupils are equal, round, and reactive to light. Left eye exhibits no discharge. No scleral icterus.  Neck: Neck supple. No JVD present. No tracheal deviation present. No thyromegaly present.  Cardiovascular: Normal rate, regular rhythm, normal heart sounds and intact distal pulses.   No  murmur heard. Pulmonary/Chest: Effort normal and breath sounds normal. No respiratory distress. She has no wheezes. She has no rales. She exhibits no tenderness.  Abdominal: Soft. Bowel sounds are normal. She exhibits no distension and no mass. There is no tenderness. There is no rebound and no guarding.  She has a panniculus. I do not feel inguinal hernia or spigelian hernia on exam, but she cannot stand because of the wheelchair and a AK amputation. Her abdomen is not distended and is not very tender either.  Musculoskeletal: She exhibits no edema and no tenderness.  Right AK amputation well healed. Left knee incision from trocar placement.  Lymphadenopathy:    She has no cervical adenopathy.  Neurological: She is alert and oriented to person, place, and time. She exhibits normal muscle tone. Coordination normal.  Skin: Skin is warm. No rash noted. She is not diaphoretic. No erythema. No pallor.  Psychiatric: She has a normal mood and affect. Her behavior is normal. Judgment and thought content normal.    Data Reviewed  Office notes from [facility] orth[facility].  Assessment    Bilateral lower abdominal pain, left greater than  right. Not well localized. Cannot demonstrate hernias on exam today. Cannot rule out either, considering history  Status post bilateral total knee replacement  Status post right above-knee amputation revision, 02/28/2012  Ongoing tobacco abuse  History cardiac catheterization with endocardial ablation in [place]  History cerebrovascular accident  History deep venous thrombosis  History a fibrillation, currently seems to be in sinus rhythm. On Primaxin  GERD  COPD and asthma  Rheumatoid arthritis by history  S/p lap. cholecystectomy    Plan    Long discussion with the patient about my physical findings and differential diagnosis.  Scheduled for CT scan of abdomen and pelvis with contrast to screen for abdominal wall hernia and defect.  Return to see me after CT scan.        Angelia Mould. Derrell Lolling, M.D., [facility] General and Minimally invasive Surgery Breast and Colorectal Surgery Office:   [phone] Pager:   [phone]  05/16/2012, 2:19 PM

## 2012-05-16 NOTE — Patient Instructions (Signed)
Examination today  does not document any hernia. It is possible that you have a hernia as the cause of the pain, however.  You will be scheduled for a CT scan of the abdomen and pelvis.  Return to see Dr. Derrell Lolling after the CT scan is performed.

## 2012-05-19 ENCOUNTER — Other Ambulatory Visit: Payer: Medicare Other

## 2012-05-23 ENCOUNTER — Encounter (HOSPITAL_COMMUNITY): Payer: Self-pay | Admitting: Pharmacy Technician

## 2012-05-23 NOTE — Progress Notes (Signed)
Need orders please DOS 06/06/12 Pt coming for PREOP 05/31/12 Thank You

## 2012-05-25 ENCOUNTER — Telehealth (INDEPENDENT_AMBULATORY_CARE_PROVIDER_SITE_OTHER): Payer: Self-pay

## 2012-05-25 ENCOUNTER — Ambulatory Visit
Admission: RE | Admit: 2012-05-25 | Discharge: 2012-05-25 | Disposition: A | Discharge status: Home / Self Care | Payer: Medicare Other | Source: Ambulatory Visit | Attending: General Surgery | Admitting: General Surgery

## 2012-05-25 DIAGNOSIS — R1032 Left lower quadrant pain: Secondary | ICD-10-CM

## 2012-05-25 MED ORDER — IOHEXOL 300 MG/ML  SOLN
100.0000 mL | Freq: Once | INTRAMUSCULAR | Status: AC | PRN
  Administered 2012-05-25: 100 mL via INTRAVENOUS

## 2012-05-25 NOTE — Telephone Encounter (Signed)
I called the pt and let her know about the CT results.  There is no hernia and no indication for surgery.  She is appreciative of the good news.  She states she has to have surgery on her amputation again soon.  She said to tell Dr Derrell Lolling thank you for seeing her and that we were very kind to her.

## 2012-05-25 NOTE — Telephone Encounter (Signed)
Message copied by Ivory Broad on Thu May 25, 2012  4:05 PM ------      Message from: Ernestene Mention      Created: Thu May 25, 2012  3:59 PM       Call radiology reports to patient.Tell her that we do not see a hernia and there is no indication for surgery at this time. ------

## 2012-05-29 ENCOUNTER — Other Ambulatory Visit: Payer: Medicare Other

## 2012-05-31 ENCOUNTER — Encounter (HOSPITAL_COMMUNITY): Payer: Self-pay

## 2012-05-31 ENCOUNTER — Ambulatory Visit (HOSPITAL_COMMUNITY)
Admission: RE | Admit: 2012-05-31 | Discharge: 2012-05-31 | Disposition: A | Discharge status: Home / Self Care | Payer: Medicare Other | Source: Ambulatory Visit | Attending: Orthopedic Surgery | Admitting: Orthopedic Surgery

## 2012-05-31 ENCOUNTER — Encounter (HOSPITAL_COMMUNITY)
Admission: RE | Admit: 2012-05-31 | Discharge: 2012-05-31 | Disposition: A | Discharge status: Home / Self Care | Payer: Medicare Other | Source: Ambulatory Visit | Attending: Orthopedic Surgery | Admitting: Orthopedic Surgery

## 2012-05-31 ENCOUNTER — Ambulatory Visit: Payer: Medicare Other | Admitting: Physical Therapy

## 2012-05-31 DIAGNOSIS — K219 Gastro-esophageal reflux disease without esophagitis: Secondary | ICD-10-CM | POA: Insufficient documentation

## 2012-05-31 DIAGNOSIS — Z01818 Encounter for other preprocedural examination: Secondary | ICD-10-CM | POA: Insufficient documentation

## 2012-05-31 DIAGNOSIS — S88919A Complete traumatic amputation of unspecified lower leg, level unspecified, initial encounter: Secondary | ICD-10-CM | POA: Insufficient documentation

## 2012-05-31 DIAGNOSIS — J4489 Other specified chronic obstructive pulmonary disease: Secondary | ICD-10-CM | POA: Insufficient documentation

## 2012-05-31 DIAGNOSIS — I4891 Unspecified atrial fibrillation: Secondary | ICD-10-CM | POA: Insufficient documentation

## 2012-05-31 DIAGNOSIS — M069 Rheumatoid arthritis, unspecified: Secondary | ICD-10-CM | POA: Insufficient documentation

## 2012-05-31 DIAGNOSIS — I10 Essential (primary) hypertension: Secondary | ICD-10-CM | POA: Insufficient documentation

## 2012-05-31 DIAGNOSIS — Z01812 Encounter for preprocedural laboratory examination: Secondary | ICD-10-CM | POA: Insufficient documentation

## 2012-05-31 DIAGNOSIS — I509 Heart failure, unspecified: Secondary | ICD-10-CM | POA: Insufficient documentation

## 2012-05-31 DIAGNOSIS — J449 Chronic obstructive pulmonary disease, unspecified: Secondary | ICD-10-CM | POA: Insufficient documentation

## 2012-05-31 LAB — PROTIME-INR: INR: 1.1 (ref 0.00–1.49)

## 2012-05-31 LAB — URINALYSIS, ROUTINE W REFLEX MICROSCOPIC
Hgb urine dipstick: NEGATIVE
Ketones, ur: NEGATIVE mg/dL
Protein, ur: NEGATIVE mg/dL
Urobilinogen, UA: 0.2 mg/dL (ref 0.0–1.0)

## 2012-05-31 LAB — BASIC METABOLIC PANEL
CO2: 28 mEq/L (ref 19–32)
Chloride: 100 mEq/L (ref 96–112)
Sodium: 137 mEq/L (ref 135–145)

## 2012-05-31 LAB — SURGICAL PCR SCREEN: MRSA, PCR: NEGATIVE

## 2012-05-31 LAB — CBC
HCT: 37.6 % (ref 36.0–46.0)
MCV: 105 fL — ABNORMAL HIGH (ref 78.0–100.0)
Platelets: 255 10*3/uL (ref 150–400)
RBC: 3.58 MIL/uL — ABNORMAL LOW (ref 3.87–5.11)
WBC: 5.2 10*3/uL (ref 4.0–10.5)

## 2012-05-31 LAB — APTT: aPTT: 51 seconds — ABNORMAL HIGH (ref 24–37)

## 2012-05-31 NOTE — Patient Instructions (Addendum)
[name]  05/31/2012   Your procedure is scheduled on:  06/06/12   Report to [facility] at    1100 AM.  Call this number if you have problems the morning of surgery: [phone]   Remember:              May have clear liquids until 0700am then npo   Do not eat food after midnite.    Take these medicines the morning of surgery with A SIP OF WATER:    Do not wear jewelry, make-up or nail polish.  Do not wear lotions, powders, or perfumes.   Do not shave 48 hours prior to surgery.   Do not bring valuables to the hospital.  Contacts, dentures or bridgework may not be worn into surgery.  Leave suitcase in the car. After surgery it may be brought to your room.  For patients admitted to the hospital, checkout time is 11:00 AM the day of  discharge.     SEE CHG INSTRUCTION SHEET    Please read over the following fact sheets that you were given: MRSA Information, coughing and deep breathing exercises, leg exercises, Blood Transfusion Fact sheet, Incentive Spirometry Fact sheet                Failure to comply with these instructions may result in cancellation of your surgery.                Patient Signature ____________________________              Nurse Signature _____________________________

## 2012-05-31 NOTE — Progress Notes (Signed)
Weight was taken with patient in wheelchair and then wheelchair weight subtracted from total weight.

## 2012-05-31 NOTE — Progress Notes (Signed)
12/06/11 LOV note with [facility]( Dr Constance Haw ) on chart  10/22/11 Cardioversion on chart  10/19/11 EKG on chart  ECHO 12/14/10 on chart Holter report 04/28/09 on chart

## 2012-06-01 ENCOUNTER — Encounter (HOSPITAL_COMMUNITY): Payer: Self-pay

## 2012-06-01 NOTE — Progress Notes (Signed)
Patient made aware of time change of surgery for 06/06/12.  Patient aware to arrive at 0600am.. Surgery timei 0900am-1030am.

## 2012-06-02 ENCOUNTER — Encounter (INDEPENDENT_AMBULATORY_CARE_PROVIDER_SITE_OTHER): Payer: Medicare Other | Admitting: General Surgery

## 2012-06-06 ENCOUNTER — Inpatient Hospital Stay (HOSPITAL_COMMUNITY): Payer: Medicare Other | Admitting: Anesthesiology

## 2012-06-06 ENCOUNTER — Encounter (HOSPITAL_COMMUNITY): Payer: Self-pay | Admitting: Anesthesiology

## 2012-06-06 ENCOUNTER — Inpatient Hospital Stay (HOSPITAL_COMMUNITY)
Admission: RE | Admit: 2012-06-06 | Discharge: 2012-06-08 | DRG: 475 | Disposition: A | Discharge status: Home Health | Payer: Medicare Other | Source: Ambulatory Visit | Attending: Orthopedic Surgery | Admitting: Orthopedic Surgery

## 2012-06-06 ENCOUNTER — Encounter (HOSPITAL_COMMUNITY)
Admission: RE | Disposition: A | Discharge status: Home Health | Payer: Self-pay | Source: Ambulatory Visit | Attending: Orthopedic Surgery

## 2012-06-06 ENCOUNTER — Encounter (HOSPITAL_COMMUNITY): Payer: Self-pay | Admitting: *Deleted

## 2012-06-06 DIAGNOSIS — Y836 Removal of other organ (partial) (total) as the cause of abnormal reaction of the patient, or of later complication, without mention of misadventure at the time of the procedure: Secondary | ICD-10-CM | POA: Diagnosis present

## 2012-06-06 DIAGNOSIS — J449 Chronic obstructive pulmonary disease, unspecified: Secondary | ICD-10-CM | POA: Diagnosis present

## 2012-06-06 DIAGNOSIS — K219 Gastro-esophageal reflux disease without esophagitis: Secondary | ICD-10-CM | POA: Diagnosis present

## 2012-06-06 DIAGNOSIS — Z8673 Personal history of transient ischemic attack (TIA), and cerebral infarction without residual deficits: Secondary | ICD-10-CM

## 2012-06-06 DIAGNOSIS — J4489 Other specified chronic obstructive pulmonary disease: Secondary | ICD-10-CM | POA: Diagnosis present

## 2012-06-06 DIAGNOSIS — D62 Acute posthemorrhagic anemia: Secondary | ICD-10-CM | POA: Diagnosis not present

## 2012-06-06 DIAGNOSIS — Z79899 Other long term (current) drug therapy: Secondary | ICD-10-CM

## 2012-06-06 DIAGNOSIS — Z880 Allergy status to penicillin: Secondary | ICD-10-CM

## 2012-06-06 DIAGNOSIS — T8730 Neuroma of amputation stump, unspecified extremity: Principal | ICD-10-CM | POA: Diagnosis present

## 2012-06-06 DIAGNOSIS — E663 Overweight: Secondary | ICD-10-CM

## 2012-06-06 DIAGNOSIS — I509 Heart failure, unspecified: Secondary | ICD-10-CM | POA: Diagnosis present

## 2012-06-06 DIAGNOSIS — D500 Iron deficiency anemia secondary to blood loss (chronic): Secondary | ICD-10-CM | POA: Diagnosis present

## 2012-06-06 DIAGNOSIS — D518 Other vitamin B12 deficiency anemias: Secondary | ICD-10-CM | POA: Diagnosis present

## 2012-06-06 DIAGNOSIS — S88919A Complete traumatic amputation of unspecified lower leg, level unspecified, initial encounter: Secondary | ICD-10-CM

## 2012-06-06 DIAGNOSIS — I4891 Unspecified atrial fibrillation: Secondary | ICD-10-CM | POA: Diagnosis present

## 2012-06-06 DIAGNOSIS — Z96659 Presence of unspecified artificial knee joint: Secondary | ICD-10-CM

## 2012-06-06 DIAGNOSIS — Z86711 Personal history of pulmonary embolism: Secondary | ICD-10-CM

## 2012-06-06 DIAGNOSIS — M069 Rheumatoid arthritis, unspecified: Secondary | ICD-10-CM | POA: Diagnosis present

## 2012-06-06 HISTORY — PX: AMPUTATION: SHX166

## 2012-06-06 LAB — TYPE AND SCREEN
ABO/RH(D): A POS
Antibody Screen: NEGATIVE

## 2012-06-06 SURGERY — AMPUTATION, ABOVE KNEE
Anesthesia: General | Site: Leg Upper | Laterality: Right | Wound class: Clean

## 2012-06-06 MED ORDER — TRAZODONE HCL 50 MG PO TABS
50.0000 mg | ORAL_TABLET | Freq: Every day | ORAL | Status: DC
  Administered 2012-06-06 – 2012-06-07 (×2): 50 mg via ORAL
  Filled 2012-06-06 (×3): qty 1

## 2012-06-06 MED ORDER — DABIGATRAN ETEXILATE MESYLATE 150 MG PO CAPS
150.0000 mg | ORAL_CAPSULE | Freq: Every day | ORAL | Status: DC
  Filled 2012-06-06: qty 1

## 2012-06-06 MED ORDER — FLEET ENEMA 7-19 GM/118ML RE ENEM: 1.0000 | ENEMA | Freq: Once | RECTAL | Status: AC | PRN

## 2012-06-06 MED ORDER — DABIGATRAN ETEXILATE MESYLATE 150 MG PO CAPS
150.0000 mg | ORAL_CAPSULE | Freq: Two times a day (BID) | ORAL | Status: DC
  Filled 2012-06-06: qty 1

## 2012-06-06 MED ORDER — HYDROMORPHONE HCL PF 1 MG/ML IJ SOLN
0.5000 mg | INTRAMUSCULAR | Status: DC | PRN
  Administered 2012-06-07 (×3): 1 mg via INTRAVENOUS
  Administered 2012-06-07 (×2): 0.5 mg via INTRAVENOUS
  Filled 2012-06-06 (×6): qty 1

## 2012-06-06 MED ORDER — ALUM & MAG HYDROXIDE-SIMETH 200-200-20 MG/5ML PO SUSP: 30.0000 mL | ORAL | Status: DC | PRN

## 2012-06-06 MED ORDER — HYDROMORPHONE HCL PF 1 MG/ML IJ SOLN
INTRAMUSCULAR | Status: DC | PRN
  Administered 2012-06-06 (×2): 0.5 mg via INTRAVENOUS
  Administered 2012-06-06: 1 mg via INTRAVENOUS

## 2012-06-06 MED ORDER — CLINDAMYCIN PHOSPHATE 900 MG/50ML IV SOLN
900.0000 mg | INTRAVENOUS | Status: AC
  Administered 2012-06-06: 900 mg via INTRAVENOUS
  Filled 2012-06-06: qty 50

## 2012-06-06 MED ORDER — POLYETHYLENE GLYCOL 3350 17 G PO PACK: 17.0000 g | PACK | Freq: Every day | ORAL | Status: DC | PRN

## 2012-06-06 MED ORDER — OXYCODONE HCL 5 MG/5ML PO SOLN: 5.0000 mg | Freq: Once | ORAL | Status: DC | PRN

## 2012-06-06 MED ORDER — DABIGATRAN ETEXILATE MESYLATE 150 MG PO CAPS
150.0000 mg | ORAL_CAPSULE | Freq: Two times a day (BID) | ORAL | Status: DC
  Administered 2012-06-07 – 2012-06-08 (×3): 150 mg via ORAL
  Filled 2012-06-06 (×5): qty 1

## 2012-06-06 MED ORDER — FLUTICASONE FUROATE-VILANTEROL 100-25 MCG/INH IN AEPB
1.0000 | INHALATION_SPRAY | Freq: Every morning | RESPIRATORY_TRACT | Status: DC
  Filled 2012-06-06 (×3): qty 1

## 2012-06-06 MED ORDER — ONDANSETRON HCL 4 MG/2ML IJ SOLN
4.0000 mg | Freq: Four times a day (QID) | INTRAMUSCULAR | Status: DC | PRN
  Administered 2012-06-06 – 2012-06-08 (×3): 4 mg via INTRAVENOUS
  Filled 2012-06-06 (×3): qty 2

## 2012-06-06 MED ORDER — DIPHENHYDRAMINE HCL 25 MG PO CAPS: 25.0000 mg | ORAL_CAPSULE | Freq: Four times a day (QID) | ORAL | Status: DC | PRN

## 2012-06-06 MED ORDER — MEPERIDINE HCL 50 MG/ML IJ SOLN: 6.2500 mg | INTRAMUSCULAR | Status: DC | PRN

## 2012-06-06 MED ORDER — HYDROMORPHONE HCL PF 1 MG/ML IJ SOLN
INTRAMUSCULAR | Status: AC
  Administered 2012-06-06: 1 mg via INTRAVENOUS
  Filled 2012-06-06: qty 1

## 2012-06-06 MED ORDER — 0.9 % SODIUM CHLORIDE (POUR BTL) OPTIME
TOPICAL | Status: DC | PRN
  Administered 2012-06-06: 1000 mL

## 2012-06-06 MED ORDER — BISACODYL 10 MG RE SUPP: 10.0000 mg | Freq: Every day | RECTAL | Status: DC | PRN

## 2012-06-06 MED ORDER — BLISTEX EX OINT
TOPICAL_OINTMENT | CUTANEOUS | Status: AC
  Administered 2012-06-06: 13:00:00
  Filled 2012-06-06: qty 10

## 2012-06-06 MED ORDER — PROPOFOL 10 MG/ML IV BOLUS
INTRAVENOUS | Status: DC | PRN
  Administered 2012-06-06: 180 mg via INTRAVENOUS

## 2012-06-06 MED ORDER — OXYCODONE HCL 5 MG PO TABS: 5.0000 mg | ORAL_TABLET | Freq: Once | ORAL | Status: DC | PRN

## 2012-06-06 MED ORDER — METOCLOPRAMIDE HCL 5 MG/ML IJ SOLN: 5.0000 mg | Freq: Three times a day (TID) | INTRAMUSCULAR | Status: DC | PRN

## 2012-06-06 MED ORDER — PANTOPRAZOLE SODIUM 40 MG PO TBEC
40.0000 mg | DELAYED_RELEASE_TABLET | Freq: Every day | ORAL | Status: DC
  Administered 2012-06-06 – 2012-06-08 (×3): 40 mg via ORAL
  Filled 2012-06-06 (×4): qty 1

## 2012-06-06 MED ORDER — BUPIVACAINE LIPOSOME 1.3 % IJ SUSP
INTRAMUSCULAR | Status: DC | PRN
  Administered 2012-06-06: 20 mL

## 2012-06-06 MED ORDER — METHOCARBAMOL 100 MG/ML IJ SOLN
500.0000 mg | Freq: Four times a day (QID) | INTRAVENOUS | Status: DC | PRN
  Filled 2012-06-06: qty 5

## 2012-06-06 MED ORDER — DOCUSATE SODIUM 100 MG PO CAPS
100.0000 mg | ORAL_CAPSULE | Freq: Two times a day (BID) | ORAL | Status: DC
  Administered 2012-06-06 – 2012-06-08 (×4): 100 mg via ORAL

## 2012-06-06 MED ORDER — CLINDAMYCIN PHOSPHATE 600 MG/50ML IV SOLN
600.0000 mg | Freq: Four times a day (QID) | INTRAVENOUS | Status: AC
  Administered 2012-06-06 (×2): 600 mg via INTRAVENOUS
  Filled 2012-06-06 (×3): qty 50

## 2012-06-06 MED ORDER — ONDANSETRON HCL 4 MG PO TABS
4.0000 mg | ORAL_TABLET | Freq: Four times a day (QID) | ORAL | Status: DC | PRN
  Administered 2012-06-08: 4 mg via ORAL
  Filled 2012-06-06: qty 1

## 2012-06-06 MED ORDER — MIDAZOLAM HCL 5 MG/5ML IJ SOLN
INTRAMUSCULAR | Status: DC | PRN
  Administered 2012-06-06 (×2): 1 mg via INTRAVENOUS

## 2012-06-06 MED ORDER — HYDROMORPHONE HCL PF 1 MG/ML IJ SOLN
0.2500 mg | INTRAMUSCULAR | Status: DC | PRN
  Administered 2012-06-06 (×2): 0.5 mg via INTRAVENOUS

## 2012-06-06 MED ORDER — ACETAMINOPHEN 10 MG/ML IV SOLN: 1000.0000 mg | Freq: Once | INTRAVENOUS | Status: DC | PRN

## 2012-06-06 MED ORDER — CHLORHEXIDINE GLUCONATE 4 % EX LIQD: 60.0000 mL | Freq: Once | CUTANEOUS | Status: DC

## 2012-06-06 MED ORDER — DIMENHYDRINATE 50 MG PO TABS
50.0000 mg | ORAL_TABLET | Freq: Every evening | ORAL | Status: DC | PRN
  Filled 2012-06-06: qty 1

## 2012-06-06 MED ORDER — LACTATED RINGERS IV SOLN
INTRAVENOUS | Status: DC
  Administered 2012-06-06: 1000 mL via INTRAVENOUS
  Administered 2012-06-06: 09:00:00 via INTRAVENOUS

## 2012-06-06 MED ORDER — SODIUM CHLORIDE 0.9 % IJ SOLN
INTRAMUSCULAR | Status: DC | PRN
  Administered 2012-06-06: 15 mL via INTRAVENOUS

## 2012-06-06 MED ORDER — SODIUM CHLORIDE 0.9 % IV SOLN
INTRAVENOUS | Status: DC
  Administered 2012-06-06 – 2012-06-07 (×2): via INTRAVENOUS
  Filled 2012-06-06 (×8): qty 1000

## 2012-06-06 MED ORDER — FENTANYL CITRATE 0.05 MG/ML IJ SOLN
INTRAMUSCULAR | Status: DC | PRN
  Administered 2012-06-06 (×2): 25 ug via INTRAVENOUS
  Administered 2012-06-06: 50 ug via INTRAVENOUS

## 2012-06-06 MED ORDER — AMIODARONE HCL 200 MG PO TABS
200.0000 mg | ORAL_TABLET | Freq: Every day | ORAL | Status: DC
  Administered 2012-06-06 – 2012-06-07 (×2): 200 mg via ORAL
  Filled 2012-06-06 (×3): qty 1

## 2012-06-06 MED ORDER — PROMETHAZINE HCL 25 MG/ML IJ SOLN: 6.2500 mg | INTRAMUSCULAR | Status: DC | PRN

## 2012-06-06 MED ORDER — PHENOL 1.4 % MT LIQD: 1.0000 | OROMUCOSAL | Status: DC | PRN

## 2012-06-06 MED ORDER — BUPIVACAINE LIPOSOME 1.3 % IJ SUSP
20.0000 mL | Freq: Once | INTRAMUSCULAR | Status: DC
  Filled 2012-06-06: qty 20

## 2012-06-06 MED ORDER — HYDROCODONE-ACETAMINOPHEN 7.5-325 MG PO TABS
1.0000 | ORAL_TABLET | ORAL | Status: DC
  Administered 2012-06-06: 2 via ORAL
  Administered 2012-06-06: 1 via ORAL
  Administered 2012-06-06: 2 via ORAL
  Administered 2012-06-07: 1 via ORAL
  Administered 2012-06-07 – 2012-06-08 (×6): 2 via ORAL
  Administered 2012-06-08: 1 via ORAL
  Filled 2012-06-06 (×11): qty 2

## 2012-06-06 MED ORDER — METHOCARBAMOL 500 MG PO TABS
500.0000 mg | ORAL_TABLET | Freq: Four times a day (QID) | ORAL | Status: DC | PRN
  Administered 2012-06-06 – 2012-06-07 (×3): 500 mg via ORAL
  Filled 2012-06-06 (×3): qty 1

## 2012-06-06 MED ORDER — METOCLOPRAMIDE HCL 10 MG PO TABS: 5.0000 mg | ORAL_TABLET | Freq: Three times a day (TID) | ORAL | Status: DC | PRN

## 2012-06-06 MED ORDER — BUPIVACAINE-EPINEPHRINE PF 0.25-1:200000 % IJ SOLN
INTRAMUSCULAR | Status: DC | PRN
  Administered 2012-06-06: 30 mL

## 2012-06-06 MED ORDER — MENTHOL 3 MG MT LOZG
1.0000 | LOZENGE | OROMUCOSAL | Status: DC | PRN
  Filled 2012-06-06: qty 9

## 2012-06-06 MED ORDER — ACETAMINOPHEN 10 MG/ML IV SOLN
INTRAVENOUS | Status: DC | PRN
  Administered 2012-06-06: 1000 mg via INTRAVENOUS

## 2012-06-06 SURGICAL SUPPLY — 44 items
BAG ZIPLOCK 12X15 (MISCELLANEOUS) ×2 IMPLANT
BANDAGE ELASTIC 6 VELCRO ST LF (GAUZE/BANDAGES/DRESSINGS) ×2 IMPLANT
BLADE SURG SZ10 CARB STEEL (BLADE) ×4 IMPLANT
CLOTH BEACON ORANGE TIMEOUT ST (SAFETY) ×2 IMPLANT
CUFF TOURN SGL QUICK 18 (TOURNIQUET CUFF) IMPLANT
DRAPE SURG 17X11 SM STRL (DRAPES) ×2 IMPLANT
DRSG PAD ABDOMINAL 8X10 ST (GAUZE/BANDAGES/DRESSINGS) ×2 IMPLANT
DRSG XEROFORM 1X8 (GAUZE/BANDAGES/DRESSINGS) ×2 IMPLANT
DURAPREP 26ML APPLICATOR (WOUND CARE) ×2 IMPLANT
ELECT REM PT RETURN 9FT ADLT (ELECTROSURGICAL) ×2
ELECTRODE REM PT RTRN 9FT ADLT (ELECTROSURGICAL) ×1 IMPLANT
GLOVE BIOGEL PI IND STRL 7.0 (GLOVE) ×1 IMPLANT
GLOVE BIOGEL PI IND STRL 7.5 (GLOVE) ×1 IMPLANT
GLOVE BIOGEL PI IND STRL 8 (GLOVE) ×1 IMPLANT
GLOVE BIOGEL PI INDICATOR 7.0 (GLOVE) ×1
GLOVE BIOGEL PI INDICATOR 7.5 (GLOVE) ×1
GLOVE BIOGEL PI INDICATOR 8 (GLOVE) ×1
GLOVE ECLIPSE 8.0 STRL XLNG CF (GLOVE) IMPLANT
GLOVE ECLIPSE 8.5 STRL (GLOVE) ×2 IMPLANT
GLOVE ORTHO TXT STRL SZ7.5 (GLOVE) ×4 IMPLANT
GLOVE SURG ORTHO 8.0 STRL STRW (GLOVE) ×2 IMPLANT
GLOVE SURG SS PI 6.5 STRL IVOR (GLOVE) ×4 IMPLANT
GOWN BRE IMP PREV XXLGXLNG (GOWN DISPOSABLE) ×4 IMPLANT
GOWN STRL NON-REIN LRG LVL3 (GOWN DISPOSABLE) ×2 IMPLANT
KIT BASIN OR (CUSTOM PROCEDURE TRAY) ×2 IMPLANT
MANIFOLD NEPTUNE II (INSTRUMENTS) ×2 IMPLANT
NS IRRIG 1000ML POUR BTL (IV SOLUTION) ×2 IMPLANT
PACK LOWER EXTREMITY WL (CUSTOM PROCEDURE TRAY) ×2 IMPLANT
PAD CAST 4YDX4 CTTN HI CHSV (CAST SUPPLIES) ×1 IMPLANT
PADDING CAST COTTON 4X4 STRL (CAST SUPPLIES) ×1
POSITIONER SURGICAL ARM (MISCELLANEOUS) ×2 IMPLANT
SOL PREP POV-IOD 16OZ 10% (MISCELLANEOUS) ×2 IMPLANT
SOL PREP PROV IODINE SCRUB 4OZ (MISCELLANEOUS) IMPLANT
SPONGE GAUZE 4X4 12PLY (GAUZE/BANDAGES/DRESSINGS) ×2 IMPLANT
STAPLER VISISTAT 35W (STAPLE) ×4 IMPLANT
SUT PROLENE 3 0 PS 2 (SUTURE) ×2 IMPLANT
SUT SILK 2 0 (SUTURE) ×1
SUT SILK 2-0 18XBRD TIE 12 (SUTURE) ×1 IMPLANT
SUT VIC AB 1 CT1 27 (SUTURE) ×2
SUT VIC AB 1 CT1 27XBRD ANTBC (SUTURE) ×2 IMPLANT
SUT VIC AB 2-0 CT1 27 (SUTURE) ×3
SUT VIC AB 2-0 CT1 TAPERPNT 27 (SUTURE) ×3 IMPLANT
TOWEL OR 17X26 10 PK STRL BLUE (TOWEL DISPOSABLE) ×4 IMPLANT
WATER STERILE IRR 1500ML POUR (IV SOLUTION) ×2 IMPLANT

## 2012-06-06 NOTE — Progress Notes (Signed)
PHARMACIST - PHYSICIAN COMMUNICATION DR:   Charlann Boxer CONCERNING:  Pradaxa  61 yoF admitted for revision of R AKA.  Pt has taking pradaxa 150mg  ONCE DAILY PTA for history of Atrial Fibrillation with RVR.   Per package insert, dosing should be 150mg  PO BID with CrCl > 30 ml/min.  Spoke with pt's cardiology office (Dr. Constance Haw at [facility]) and verified patient should be taking the regular dose of 150mg  BID.    Will resume post-op pradaxa at new dose of 150mg  po BID per cardiology.  Provided education to patient on pradaxa.  Cardiology office spoke with patient as well today to clarify dose.   RECOMMENDATION: On discharge planning, please update pradaxa dose to 150mg  PO BID.     Please call pharmacy with any questions or for clarification.  Thanks!  Haynes Hoehn, PharmD 06/06/2012 2:32 PM  Pager: [phone]

## 2012-06-06 NOTE — Anesthesia Preprocedure Evaluation (Signed)
Anesthesia Evaluation  Patient identified by MRN, date of birth, ID band Patient awake    Reviewed: Allergy & Precautions, H&P , NPO status , Patient's Chart, lab work & pertinent test results  Airway Mallampati: II TM Distance: >3 FB Neck ROM: full    Dental no notable dental hx. (+) Dental Advisory Given   Pulmonary asthma , pneumonia -, resolved, COPD COPD inhaler,  breath sounds clear to auscultation Took inhaler last pm. Does not feel that she wants a nebulizer treatment at this time.  + wheezing      Cardiovascular +CHF + dysrhythmias Atrial Fibrillation Rhythm:regular Rate:Normal  ECG: NSR, atrial fibrillation resolved (02/21/12). First degree AV block.   Neuro/Psych  Neuromuscular disease CVA, No Residual Symptoms negative psych ROS   GI/Hepatic negative GI ROS, Neg liver ROS, GERD-  ,  Endo/Other  negative endocrine ROS  Renal/GU negative Renal ROS     Musculoskeletal  (+) Arthritis -, Rheumatoid disorders,    Abdominal   Peds  Hematology negative hematology ROS (+)   Anesthesia Other Findings   Reproductive/Obstetrics                           Anesthesia Physical  Anesthesia Plan  ASA: III  Anesthesia Plan: General   Post-op Pain Management:    Induction: Intravenous  Airway Management Planned: LMA and Oral ETT  Additional Equipment:   Intra-op Plan:   Post-operative Plan: Extubation in OR  Informed Consent: I have reviewed the patients History and Physical, chart, labs and discussed the procedure including the risks, benefits and alternatives for the proposed anesthesia with the patient or authorized representative who has indicated his/her understanding and acceptance.   Dental advisory given  Plan Discussed with: CRNA  Anesthesia Plan Comments:         Anesthesia Quick Evaluation

## 2012-06-06 NOTE — H&P (Signed)
[name] is an 67 y.o. female.      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 female complaining of right stump pain.  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 She has been fitted for a prosthetic leg, but because of pain she hasn't been able to comfortable wear it. Various options of have been discussed with Dr. Charlann Boxer. At this time it has been decided to excise the painful neuroma of the distal right stump. An attempt will be made to find the nerves and excise them to help with pain.  She did have a previous surgery on the right stump to remove some excess tissue which has done well except the now painful neuroma.  Various options are discussed with the patient. Risks, benefits and expectations were discussed with the patient. Patient understand the risks, benefits and expectations and wishes to proceed with surgery.   PCP:  Marylen Ponto, MD  D/C Plans:  Home with HHPT  Post-op Meds:  No Rx given   Tranexamic Acid:   Not to be given - CAD  Decadron:  Not to be given  PMH: Past Medical History   Diagnosis  Date   .  Emphysema     .  Atrial fibrillation     .  COPD (chronic obstructive pulmonary disease)     .  History of atrial fibrillation         S/pP Ablation   .  Asthma     .  Arthritis     .  Osteoarthritis     .  Rheumatoid arthritis     .  Anemia     .  B12 deficiency anemia     .  Stroke         slight stroke in 2009-no deficits   .  Neuromuscular disorder         hx of right quadricep rupture   .  DJD (degenerative joint disease)     .  History of benign bladder tumor     .  DVT (deep venous thrombosis)     .  Blood transfusion         2009    .  Pneumonia         hx of   .  GERD (gastroesophageal reflux disease)        PSH: Past Surgical History   Procedure  Date   .  Replacement total knee     .  Cardiac electrophysiology mapping and ablation     .  Cholecystectomy  1996   .  Cataract  extraction, bilateral     .  Hematoma evacuation  2009       From right groin   .  Joint replacement         Left-1996, Right-1995   .  Multiple tooth extractions  06/30/2010   .  Trigger finger release  2011   .  Amputation  12/17/2010       Procedure: AMPUTATION ABOVE KNEE;  Surgeon: [name];  Location: [facility];  Service: Orthopedics;  Laterality: Right;   .  Application of wound vac  12/17/2010       Procedure: APPLICATION OF WOUND VAC;  Surgeon: [name];  Location: [facility];  Service: Orthopedics;  Laterality: Right;  wound Vac applied @ 2045 By Luna Fuse   .  Amputation  04/27/2011  Procedure: AMPUTATION ABOVE KNEE;  Surgeon: [name], MD;  Location: [facility];  Service: Orthopedics;  Laterality: Right;  Revision Right above knee amputation.   .  Cardiac catheterization     .  Cardioversion         x5 times     Social History: reports that she quit smoking about 16 years ago. Her smoking use included Cigarettes. She has a 33 pack-year smoking history. She has never used smokeless tobacco. She reports that she does not drink alcohol or use illicit drugs.   Allergies:   .  Sulfa Antibiotics  Anaphylaxis and Shortness Of Breath   .  Avelox (Moxifloxacin Hcl In Nacl)  Diarrhea and Nausea And Vomiting   .  Penicillins  Hives and Rash     Medications: .  acetaminophen (TYLENOL) 500 MG tablet  Take 500 mg by mouth every 6 (six) hours as needed. For pain         .  amiodarone (PACERONE) 200 MG tablet  Take 200 mg by mouth every morning.         .  celecoxib (CELEBREX) 200 MG capsule  Take 200 mg by mouth 2 (two) times daily.         .  dabigatran (PRADAXA) 150 MG CAPS  Take 150 mg by mouth 2 (two) times daily.         Marland Kitchen  dimenhyDRINATE (DRAMAMINE) 50 MG tablet  Take 50 mg by mouth every 8 (eight) hours as needed. For nausea         .  doxycycline (VIBRA-TABS) 100 MG tablet  Take 100 mg by mouth 2 (two) times daily.         .  Fluticasone Furoate-Vilanterol (BREO ELLIPTA)  100-25 MCG/INH AEPB  Inhale 1 puff into the lungs daily.         Marland Kitchen  gabapentin (NEURONTIN) 300 MG capsule  Take 300 mg by mouth 2 (two) times daily.         Marland Kitchen  HYDROcodone-acetaminophen (NORCO/VICODIN) 5-325 MG per tablet  Take 1 tablet by mouth every 6 (six) hours as needed. For pain         .  methocarbamol (ROBAXIN) 500 MG tablet  Take 500 mg by mouth 2 (two) times daily.         Marland Kitchen  omeprazole (PRILOSEC) 20 MG capsule  Take 20 mg by mouth daily.            Review of Systems  Constitutional: Negative.   HENT: Positive for tinnitus.   Eyes: Negative.   Respiratory: Positive for cough.   Cardiovascular: Negative.   Gastrointestinal: Positive for heartburn.  Genitourinary: Negative.   Musculoskeletal: Positive for myalgias (muscle spasms, right AKA) and joint pain.  Skin: Negative.   Neurological: Negative.   Endo/Heme/Allergies: Positive for environmental allergies.  Psychiatric/Behavioral: Negative.     Physical Exam  Constitutional: She is oriented to person, place, and time and well-developed, well-nourished, and in no distress.  HENT:   Head: Normocephalic and atraumatic.   Mouth/Throat: Oropharynx is clear and moist. She has dentures.  Eyes: Pupils are equal, round, and reactive to light.  Neck: Neck supple. No JVD present. No tracheal deviation present. No thyromegaly present.  Cardiovascular: Normal rate, regular rhythm, normal heart sounds and intact distal pulses.   Pulmonary/Chest: Effort normal and breath sounds normal. No stridor. No respiratory distress.  Abdominal: There is no tenderness. There is no guarding.  Musculoskeletal:  Legs:  Painful specific areas on the distal portion of the right stump Lymphadenopathy:    She has no cervical adenopathy.  Neurological: She is alert and oriented to person, place, and time.  Skin: Skin is warm and dry.  Psychiatric: Affect normal.      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 Charlann Boxer at [facility]. Risks benefits and expectations were discussed with the patient. Patient understand risks, benefits and expectations and wishes to proceed.    Anastasio Auerbach Jamaurie Bernier   PAC  06/06/2012, 7:20 AM

## 2012-06-06 NOTE — Transfer of Care (Signed)
Immediate Anesthesia Transfer of Care Note  Patient: [name]  Procedure(s) Performed: Procedure(s): REVISION ABOVE RIGHT KNEE AMPUTATION, STUMP  (Right)  Patient Location: PACU  Anesthesia Type:General  Level of Consciousness: awake, alert  and oriented  Airway & Oxygen Therapy: Patient Spontanous Breathing and Patient connected to face mask oxygen  Post-op Assessment: Report given to PACU RN and Post -op Vital signs reviewed and stable  Post vital signs: Reviewed and stable  Complications: No apparent anesthesia complications

## 2012-06-06 NOTE — Progress Notes (Signed)
Utilization review completed.  

## 2012-06-06 NOTE — Anesthesia Postprocedure Evaluation (Signed)
Anesthesia Post Note  Patient: [name]  Procedure(s) Performed: Procedure(s) (LRB): REVISION ABOVE RIGHT KNEE AMPUTATION, STUMP  (Right)  Anesthesia type: General  Patient location: PACU  Post pain: Pain level controlled  Post assessment: Post-op Vital signs reviewed  Last Vitals: BP 123/64  Pulse 70  Temp(Src) 36.4 C (Oral)  Resp 17  SpO2 94%  Post vital signs: Reviewed  Level of consciousness: sedated  Complications: No apparent anesthesia complications

## 2012-06-06 NOTE — Interval H&P Note (Signed)
History and Physical Interval Note:  06/06/2012 7:29 AM  [name]  has presented today for surgery, with the diagnosis of painful right above knee amputation stump  neroma   The various methods of treatment have been discussed with the patient and family. After consideration of risks, benefits and other options for treatment, the patient has consented to  Procedure(s): REVISION ABOVE RIGHT KNEE AMPUTATION, STUMP  (Right) as a surgical intervention .  The patient's history has been reviewed, patient examined, no change in status, stable for surgery.  I have reviewed the patient's chart and labs.  Questions were answered to the patient's satisfaction.     [name]

## 2012-06-07 ENCOUNTER — Encounter (HOSPITAL_COMMUNITY): Payer: Self-pay | Admitting: Orthopedic Surgery

## 2012-06-07 DIAGNOSIS — E663 Overweight: Secondary | ICD-10-CM

## 2012-06-07 LAB — BASIC METABOLIC PANEL
CO2: 28 mEq/L (ref 19–32)
Calcium: 8.2 mg/dL — ABNORMAL LOW (ref 8.4–10.5)
Chloride: 100 mEq/L (ref 96–112)
Sodium: 134 mEq/L — ABNORMAL LOW (ref 135–145)

## 2012-06-07 LAB — CBC
MCH: 35.9 pg — ABNORMAL HIGH (ref 26.0–34.0)
Platelets: 171 10*3/uL (ref 150–400)
RBC: 2.73 MIL/uL — ABNORMAL LOW (ref 3.87–5.11)
WBC: 4.9 10*3/uL (ref 4.0–10.5)

## 2012-06-07 NOTE — Progress Notes (Signed)
   Subjective: 1 Day Post-Op Procedure(s) (LRB): REVISION ABOVE RIGHT KNEE AMPUTATION, STUMP  (Right)   Patient reports pain as mild, pain gradually getting better though still needing some IV analgesic medication. No events throughout the night.  Objective:   VITALS:   Filed Vitals:   06/07/12 0612  BP: 92/58  Pulse: 66  Temp: 98.5 F (36.9 C)  Resp: 17    No cellulitis present Compartment soft  LABS  Recent Labs  06/07/12 0407  HGB 9.8*  HCT 29.0*  WBC 4.9  PLT 171     Recent Labs  06/07/12 0407  NA 134*  K 4.0  BUN 9  CREATININE 0.61  GLUCOSE 93     Assessment/Plan: 1 Day Post-Op Procedure(s) (LRB): REVISION ABOVE RIGHT KNEE AMPUTATION, STUMP  (Right) Advance diet Up with therapy D/C IV fluids Discharge home with home health eventually, when ready  Expected ABLA  Treated with iron and will observe  Overweight (BMI 25-29.9) Estimated body mass index is 25.12 kg/(m^2) as calculated from the following:   Height as of this encounter: 5\' 11"  (1.803 m).   Weight as of this encounter: 81.647 kg (180 lb). Patient also counseled that weight may inhibit the healing process Patient counseled that losing weight will help with future health issues     Anastasio Auerbach. Jalena Vanderlinden   PAC  06/07/2012, 10:06 AM

## 2012-06-07 NOTE — Evaluation (Signed)
Occupational Therapy Evaluation Patient Details Name: [name] MRN: [id number] DOB: 1945/12/14 Today's Date: 06/07/2012 Time: 3086-5784 OT Time Calculation (min): 31 min  OT Assessment / Plan / Recommendation Clinical Impression  Pt admitted for revision of R AKA.  Motivated to return home independently.  Uses manual w/c as primary means of mobility.  Needs a tub transfer bench for home and HHOT to instruct in use and for safety eval.  Pt has had [facility] in the past and wishes to use them again.  Also wants to see if she could get personal care services on an ongoing basis through Medicaid.    OT Assessment  Patient needs continued OT Services    Follow Up Recommendations  Home health OT;Supervision - Intermittent    Barriers to Discharge      Equipment Recommendations  Tub/shower bench    Recommendations for Other Services    Frequency  Min 2X/week    Precautions / Restrictions Precautions Precautions: Fall Restrictions Weight Bearing Restrictions: No   Pertinent Vitals/Pain R LE, repositioned, RN notified    ADL  Eating/Feeding: Independent Where Assessed - Eating/Feeding: Bed level Grooming: Wash/dry hands;Wash/dry face;Teeth care;Set up Where Assessed - Grooming: Unsupported sitting Upper Body Bathing: Set up Where Assessed - Upper Body Bathing: Unsupported sitting Lower Body Bathing: Moderate assistance Where Assessed - Lower Body Bathing: Unsupported sitting;Supported sit to stand Upper Body Dressing: Set up Where Assessed - Upper Body Dressing: Unsupported sitting Lower Body Dressing: Minimal assistance Where Assessed - Lower Body Dressing: Unsupported sitting;Supported sit to stand Toilet Transfer: Min Pension scheme manager Method: Sit to Barista: Bedside commode Toileting - Clothing Manipulation and Hygiene: Minimal assistance Where Assessed - Engineer, mining and Hygiene: Sit to stand from 3-in-1  or toilet Equipment Used: Rolling walker;Gait belt Transfers/Ambulation Related to ADLs: min guard with bed elevated for sit to stand and transfers ADL Comments: Pt is able to donn and doff sock and shoe (leaves tied).  Assist for standing manipulation of pants and for pericare.  Pt uses a w/c as primary means of mobility at home.  He shower is a roll-in, but she cannot reach it with her w/c, but thinks she could use a tub bench.    OT Diagnosis: Generalized weakness;Acute pain  OT Problem List: Impaired balance (sitting and/or standing);Impaired UE functional use;Pain;Decreased strength OT Treatment Interventions: Self-care/ADL training;Balance training   OT Goals Acute Rehab OT Goals OT Goal Formulation: With patient Time For Goal Achievement: 06/14/12 Potential to Achieve Goals: Good ADL Goals Pt Will Perform Grooming: with modified independence;Standing at sink ADL Goal: Grooming - Progress: Goal set today Pt Will Perform Lower Body Bathing: with modified independence;Sit to stand from bed ADL Goal: Lower Body Bathing - Progress: Goal set today Pt Will Perform Lower Body Dressing: with modified independence;Sit to stand from bed ADL Goal: Lower Body Dressing - Progress: Goal set today Pt Will Perform Toileting - Clothing Manipulation: with modified independence;Standing ADL Goal: Toileting - Clothing Manipulation - Progress: Goal set today  Visit Information  Last OT Received On: 06/07/12 Assistance Needed: +1    Subjective Data  Subjective: "I need a tub bench at home and a personal care worker." Patient Stated Goal: Home with intermittent assist of neighbors and sister.   Prior Functioning     Home Living Lives With: Alone Available Help at Discharge: Other (Comment);Neighbor;Available PRN/intermittently (has paid help for some transportation) Type of Home: Apartment Home Access: Ramped entrance Home Layout: One level  Bathroom Shower/Tub: Walk-in shower (not accessible  due to toilet) Bathroom Toilet: Standard Bathroom Accessibility: Yes How Accessible: Accessible via wheelchair Home Adaptive Equipment: Wheelchair - manual;Bedside commode/3-in-1;Grab bars in shower;Hand-held shower hose;Hospital bed;Reacher;Walker - rolling Prior Function Level of Independence: Independent with assistive device(s) Able to Take Stairs?: No Driving: Yes Vocation: Retired Comments: Pt wants to investigate PCS through Medicaid. Communication Communication: No difficulties Dominant Hand: Right         Vision/Perception Vision - History Baseline Vision: Wears glasses only for reading Patient Visual Report: No change from baseline   Cognition  Cognition Arousal/Alertness: Awake/alert Behavior During Therapy: WFL for tasks assessed/performed Overall Cognitive Status: Within Functional Limits for tasks assessed    Extremity/Trunk Assessment Right Upper Extremity Assessment RUE ROM/Strength/Tone: WFL for tasks assessed RUE Coordination: WFL - gross/fine motor Left Upper Extremity Assessment LUE ROM/Strength/Tone: Deficits LUE ROM/Strength/Tone Deficits: shoulder deficits due to rotator cuff tear LUE Coordination: WFL - fine motor Trunk Assessment Trunk Assessment: Normal     Mobility Bed Mobility Bed Mobility: Supine to Sit;Sitting - Scoot to Edge of Bed Supine to Sit: 6: Modified independent (Device/Increase time);HOB elevated Sitting - Scoot to Edge of Bed: 6: Modified independent (Device/Increase time) Transfers Transfers: Sit to Stand;Stand to Sit Sit to Stand: 4: Min guard;With upper extremity assist;From bed;From chair/3-in-1 Stand to Sit: 4: Min guard;With upper extremity assist;To chair/3-in-1     Exercise     Balance     End of Session OT - End of Session Activity Tolerance: Patient limited by pain Patient left: in chair;with call bell/phone within reach;with family/visitor present Nurse Communication: Patient requests pain meds  GO      Evern Bio 06/07/2012, 10:48 AM [phone]

## 2012-06-07 NOTE — Evaluation (Signed)
Physical Therapy Evaluation Patient Details Name: [name] MRN: [id number] DOB: 03/01/1945 Today's Date: 06/07/2012 Time: 6644-0347 PT Time Calculation (min): 37 min  PT Assessment / Plan / Recommendation Clinical Impression  pt is s/p R AKA revision and will benefit form PT to improve independence  and safety for return home alone; Deep pressure to right quads/hip flexors to assist with pain control and muscle spasms this pm, followed by blankets to anterior R residual limb and pain meds per RN; pt reported some pain relief but pain continues to be an issue    PT Assessment  Patient needs continued PT services    Follow Up Recommendations  No PT follow up    Does the patient have the potential to tolerate intense rehabilitation      Barriers to Discharge        Equipment Recommendations  None recommended by PT    Recommendations for Other Services     Frequency Min 5X/week    Precautions / Restrictions Precautions Precautions: Fall Restrictions Weight Bearing Restrictions: No   Pertinent Vitals/Pain       Mobility  Bed Mobility Bed Mobility: Sit to Supine Sit to Supine: 5: Supervision Transfers Transfers: Sit to Stand;Stand to Sit;Stand Pivot Transfers Sit to Stand: 4: Min guard Stand to Sit: 4: Min guard Stand Pivot Transfers: 4: Min guard Details for Transfer Assistance: min/guard for safety; chair to wheel/chair; wheelchair to bed Wheelchair Mobility Wheelchair Mobility: Yes Wheelchair Assistance: 5: Supervision;4: Min guard;4: Min Chiropodist Details (indicate cue type and reason): min/guard, occasional assist  due pt using only LUE, holding pressure on R residual limb due pain Wheelchair Propulsion: Left upper extremity;Left lower extremity Wheelchair Parts Management: Independent Distance: 120    Exercises     PT Diagnosis: Acute pain  PT Problem List: Decreased strength;Decreased activity tolerance;Decreased  balance;Pain PT Treatment Interventions: DME instruction;Functional mobility training;Therapeutic activities;Therapeutic exercise;Patient/family education;Wheelchair mobility training   PT Goals Acute Rehab PT Goals PT Goal Formulation: With patient Time For Goal Achievement: 06/07/12 Potential to Achieve Goals: Good Pt will go Supine/Side to Sit: Independently PT Goal: Supine/Side to Sit - Progress: Goal set today Pt will Transfer Bed to Chair/Chair to Bed: with modified independence PT Transfer Goal: Bed to Chair/Chair to Bed - Progress: Goal set today Pt will Propel Wheelchair: 51 - 150 feet;Independently PT Goal: Propel Wheelchair - Progress: Goal set today  Visit Information  Last PT Received On: 06/07/12    Subjective Data  Subjective: my wheelchair is in the latrine Patient Stated Goal: home   Prior Functioning  Home Living Lives With: Alone Available Help at Discharge: Neighbor;Available PRN/intermittently Type of Home: Apartment Home Access: Ramped entrance Home Layout: One level Bathroom Shower/Tub: Health visitor: Standard How Accessible: Accessible via wheelchair Home Adaptive Equipment: Wheelchair - manual;Bedside commode/3-in-1;Grab bars in shower;Hand-held shower hose;Hospital bed;Reacher;Walker - rolling Prior Function Level of Independence: Independent with assistive device(s) Vocation: Retired Musician: No difficulties    Copywriter, advertising Arousal/Alertness: Awake/alert Behavior During Therapy: WFL for tasks assessed/performed Overall Cognitive Status: Within Functional Limits for tasks assessed    Extremity/Trunk Assessment Right Lower Extremity Assessment RLE ROM/Strength/Tone: Deficits;Due to pain RLE ROM/Strength/Tone Deficits: R AKA; hip AROM grossly WFl; unable to test hip strength due to pain Left Lower Extremity Assessment LLE ROM/Strength/Tone: WFL for tasks assessed   Balance    End of Session PT -  End of Session Activity Tolerance: Patient limited by pain Patient left: in bed  GP  [facility] 06/07/2012, 3:52 PM

## 2012-06-07 NOTE — Brief Op Note (Signed)
06/06/2012  6:41 AM  PATIENT:  [name]  67 y.o. 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   PROCEDURE:  Procedure(s): REVISION ABOVE RIGHT KNEE AMPUTATION, STUMP  (Right)  SURGEON:  Surgeon(s) and Role:    * [name], MD - Primary  PHYSICIAN ASSISTANT: Lanney Gins, PA-C}   ANESTHESIA:   general, LMA  EBL:  Total I/O In: 748.3 [I.V.:748.3] Out: 300 [Urine:300]  BLOOD ADMINISTERED:none  DRAINS: none   LOCAL MEDICATIONS USED:  OTHER Exparel  SPECIMEN:  No Specimen  DISPOSITION OF SPECIMEN:  N/A  COUNTS:  YES  TOURNIQUET:  * No tourniquets in log *  DICTATION: .Other Dictation: Dictation Number [id number]  PLAN OF CARE: Admit to inpatient   PATIENT DISPOSITION:  PACU - hemodynamically stable.   Delay start of Pharmacological VTE agent (>24hrs) due to surgical blood loss or risk of bleeding: no

## 2012-06-08 MED ORDER — HYDROCODONE-ACETAMINOPHEN 7.5-325 MG PO TABS: 1.0000 | ORAL_TABLET | ORAL | Status: DC | PRN

## 2012-06-08 MED ORDER — METHOCARBAMOL 500 MG PO TABS: 500.0000 mg | ORAL_TABLET | Freq: Four times a day (QID) | ORAL | Status: DC | PRN

## 2012-06-08 NOTE — Progress Notes (Signed)
   Subjective: 2 Days Post-Op Procedure(s) (LRB): REVISION ABOVE RIGHT KNEE AMPUTATION, STUMP  (Right)   Patient reports pain as mild, pain well controlled. No events throughout the night. Happy with her progress so far and ready to continue to get better and eventually into her prothesis again.  Objective:   VITALS:   Filed Vitals:   06/08/12 0510  BP: 123/71  Pulse: 73  Temp: 98.5 F (36.9 C)  Resp: 16    Incision: dressing C/D/I No cellulitis present Compartment soft  LABS  Recent Labs  06/07/12 0407  HGB 9.8*  HCT 29.0*  WBC 4.9  PLT 171     Recent Labs  06/07/12 0407  NA 134*  K 4.0  BUN 9  CREATININE 0.61  GLUCOSE 93     Assessment/Plan: 2 Days Post-Op Procedure(s) (LRB): REVISION ABOVE RIGHT KNEE AMPUTATION, STUMP  (Right)   Discharge home with home health Follow up in 2 weeks at [facility]. Follow up with OLIN,Nikkolas Coomes D in 2 weeks.  Contact information:  [facility] [address] [place] [address] [phone]        Anastasio Auerbach. Elizabella Nolet   PAC  06/08/2012, 12:07 PM

## 2012-06-08 NOTE — Op Note (Signed)
[name], [name]          ACCOUNT NO.:  [id number]  MEDICAL RECORD NO.:  [id number]  LOCATION:  1603                         FACILITY:  [facility]  PHYSICIAN:  Madlyn Frankel. Charlann Boxer, M.D.  DATE OF BIRTH:  02-Jan-1946  DATE OF PROCEDURE:  06/06/2012 DATE OF DISCHARGE:                              OPERATIVE REPORT   PREOPERATIVE DIAGNOSIS:  Painful right above-the-knee amputation, presumably related to persistent recurring neuroma formation, noted medial, lateral and posterior.  POSTOPERATIVE DIAGNOSIS:  Painful right above-the-knee amputation, presumably related to persistent recurring neuroma formation, noted medial, lateral and posterior.  PROCEDURE:  Revision and amputation of right above-the-knee amputation, focusing on trying to identify nerve locations, resection allowing further retraction as well as replicating musculature into the stomach.  SURGEON:  Madlyn Frankel. Charlann Boxer, M.D.  ASSISTANT:  Lanney Gins, PA-C.  Note that Mr. [name] was present for the entirety of the case, facilitate management of the operative extremity, general facilitation of the case and primary wound closure.  ANESTHESIA:  General LMA.  SPECIMENS:  None.  DRAINS:  None.  COMPLICATIONS:  None apparent.  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  She has been recovering fairly well, but has had persistent problems attempting to be fit with prosthetic limb due to pain and pressure over the distal portion of the stump.  We have already attempted to revise this once by identifying the medial saphenous lateral, lateral sural nerve, and posterior sciatic nerves and tried to resect them further out of the way for direct pressure.  Upon the recent attempts being fitted for new prosthesis, she had increased discomfort and wished to have this  re- explored for potential improvements.  Risks of recurrence and persistence of problems based on the location and anatomy of her nerve branches were all discussed.  Consent was obtained for benefit in hope of improve pain, intolerance of stump.  PROCEDURE IN DETAIL:  The patient was brought into the operative theater.  Once adequate anesthesia was established, preoperative antibiotics, clindamycin administered.  She was positioned supine.  The perineum was draped out with a 10-15 drape.  The right lower extremity was then prepped and draped in sterile fashion.  Time-out was performed identifying the patient, planned procedure, and extremity.  As I reviewed with [name], I extended the incisions medially and laterally.  Soft tissue planes created.  Once I was able to identify certain structures including the adductor musculature as well as poster musculature, I did find what I felt to be an atrophied sciatic nerve and was able to retract it a bit and excised it sharply with a scalpel to allow for further retractions posteriorly.  Medially and laterally despite attempts at turn to identify the exact location of the sural nerve and the saphenous nerve was unsuccessful due to perhaps fatty atropine infiltration of this area and all seemed to be very similar.  Nonetheless, I did dissect and excise the tissue in this area to hopefully improve the symptoms.  Once this was done and hemostasis was achieved, I did try to replicate the adductor and posterior  musculature to the anterior musculature utilizing #1 Vicryl suture.  This seemed to provide a nice cushion over the distal portion of stump if it were able to heal in this fashion.  I then identified significant redundant skin.  I did excise about a 1.5 cm to 2 cm worth of skin on the posterior aspect of skin and subsequently reapproximated the remaining skin using combination of 2-0 Vicryl and staples.  Her wound was then at  this point cleaned, dried, and dressed sterilely with Xeroform and a Mepilex dressing wrapped with a gauze covered over an ABD.  Following application of sterile Ace wrap, she was then awoken from anesthesia and brought to the recovery room in stable condition tolerating the procedure well.  Findings will be reviewed with hopeful improvement.     Madlyn Frankel Charlann Boxer, M.D.     MDO/MEDQ  D:  06/07/2012  T:  06/08/2012  Job:  [id number]

## 2012-06-08 NOTE — Care Management Note (Signed)
    Page 1 of 2   06/08/2012     2:26:54 PM   CARE MANAGEMENT NOTE 06/08/2012  Patient:  [name], [name]   Account Number:  [id number]  Date Initiated:  06/08/2012  Documentation initiated by:  Colleen Can  Subjective/Objective Assessment:   dx revision & amputation of right above the knee amputaqtion on day of admission.     Action/Plan:   CM spoke with patient. Plans are for her to return to her home in [place] where  she will have caregiver . Care giver will also pick her up from hosp. She already has DME-hospital bed, BSC, wheelchair and prosthetic limb.   Anticipated DC Date:  06/08/2012   Anticipated DC Plan:  HOME W HOME HEALTH SERVICES      DC Planning Services  CM consult      [facility] Choice  HOME HEALTH   Choice offered to / List presented to:  C-1 Patient        HH arranged  HH-1 RN  HH-3 OT      [facility] agency  [facility]   Status of service:  Completed, signed off Medicare Important Message given?  NA - LOS <3 / Initial given by admissions (If response is "NO", the following Medicare IM given date fields will be blank) Date Medicare IM given:   Date Additional Medicare IM given:    Discharge Disposition:  HOME W HOME HEALTH SERVICES  Per UR Regulation:    If discussed at Long Length of Stay Meetings, dates discussed:    Comments:  06/08/2012 Colleen Can BSN RN CCM [phone] PT is requesting [facility] for skilled home services; also wants some one who can help clean home. Advised that medicare A/B will not usually cover personall care services. Advised patient she or caregiver might call her case worker for her medicaid paln to see if such services are available. Pt states she would. I did supply patient with list of privsate duty agencies who are self pay for her use as needed. She thanked me for the list and voices understanding regarding utilization of the list. [facility] was notified of service request for skilled  services and can provide [facility] services.

## 2012-06-08 NOTE — Discharge Summary (Signed)
Physician Discharge Summary  Patient ID: CARSEN MACHI MRN: [id number] DOB/AGE: 67-04-1945 67 y.o.  Admit date: 06/06/2012 Discharge date: 06/08/2012   Procedures:  Procedure(s) (LRB): REVISION ABOVE RIGHT KNEE AMPUTATION, STUMP  (Right)  Attending Physician:  Dr. 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 Emphysema   . Atrial fibrillation   . COPD (chronic obstructive pulmonary disease)   . History of atrial fibrillation     S/pP Ablation  . Asthma   . Arthritis   . Osteoarthritis   . Rheumatoid arthritis   . Anemia   . B12 deficiency anemia   . Stroke     slight stroke in 2009-no deficits  . Neuromuscular disorder     hx of right quadricep rupture  . DJD (degenerative joint disease)   . History of benign bladder tumor   . DVT (deep venous thrombosis)   . Blood transfusion     2009   . Pneumonia     hx of  . GERD (gastroesophageal reflux disease)   . CHF (congestive heart failure)     HPI: Pt is a 67 y.o. female complaining of right stump pain. 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 She has been fitted for a prosthetic leg, but because of pain she hasn't been able to comfortable wear it. Various options of have been discussed with Dr. Charlann Boxer. At this time it has been decided to excise the painful neuroma of the distal right stump. An attempt will be made to find the nerves and excise them to help with pain. She did have a previous surgery on the right stump to remove some excess tissue which has done well except the now painful neuroma. Various options are discussed with the patient. Risks, benefits and expectations were discussed with the patient. Patient understand the risks, benefits and expectations and wishes to proceed with  surgery.  PCP: Marylen Ponto, MD   Discharged Condition: good  Hospital Course:  Patient underwent the above stated procedure on 06/06/2012. Patient tolerated the procedure well and brought to the recovery room in good condition and subsequently to the floor.  POD #1 BP: 92/58 ; Pulse: 66 ; Temp: 98.5 F (36.9 C) ; Resp: 17 Pt's foley was removed, as well as the hemovac drain removed. IV was changed to a saline lock. Patient reports pain as mild, pain gradually getting better though still needing some IV analgesic medication. No events throughout the night. No cellulitis present and compartment soft.   LABS  Basename  06/07/12    0407   HGB  9.8  HCT  29.0   POD #2  BP: 123/71 ; Pulse: 73 ; Temp: 98.5 F (36.9 C) ; Resp: 16  Patient reports pain as mild, pain well controlled. No events throughout the night. Happy with her progress so far and ready to continue to get better and eventually into her prothesis again. Incision: dressing C/D/I, no cellulitis present and compartment soft.   LABS   No new labs  Discharge Exam: General appearance: alert, cooperative and no distress Extremities: Homans sign is negative, no sign of DVT, no edema, redness or tenderness in the calves or thighs and no ulcers, gangrene or trophic changes  Disposition:  Home or Self Care with follow up in 2 weeks   Follow-up Information   Follow up with [name], MD. Schedule an appointment as soon as possible for a visit in 2 weeks.   Contact information:   [address] [place] [address] [phone]       Discharge Orders   Future Orders Complete By Expires     Call MD / Call 911  As directed     Comments:      If you experience chest pain or shortness of breath, CALL 911 and be transported to the hospital emergency room.  If you develope a fever above 101 F, pus (white drainage) or increased drainage or redness at the wound, or calf pain, call your surgeon's office.     Constipation Prevention  As directed     Comments:      Drink plenty of fluids.  Prune juice may be helpful.  You may use a stool softener, such as Colace (over the counter) 100 mg twice a day.  Use MiraLax (over the counter) for constipation as needed.    Diet - low sodium heart healthy  As directed     Discharge instructions  As directed     Comments:      Daily dressing changes with 4x4 gauze and tape. Keep the area dry and clean until follow up. Follow up in 2 weeks at [facility]. Call with any questions or concerns.    Increase activity slowly as tolerated  As directed          Medication List    TAKE these medications       amiodarone 200 MG tablet  Commonly known as:  PACERONE  Take 200 mg by mouth every evening.     BREO ELLIPTA 100-25 MCG/INH Aepb  Generic drug:  Fluticasone Furoate-Vilanterol  Inhale 1 puff into the lungs every morning.     celecoxib 200 MG capsule  Commonly known as:  CELEBREX  Take 200 mg by mouth every evening.     dabigatran 150 MG Caps  Commonly known as:  PRADAXA  Take 150 mg by mouth every evening.     dimenhyDRINATE 50 MG tablet  Commonly known as:  DRAMAMINE  Take 50 mg by mouth at bedtime as needed (sleep).     diphenhydrAMINE 25 mg capsule  Commonly known as:  BENADRYL  Take 50 mg by mouth at bedtime as needed for itching or sleep.     HYDROcodone-acetaminophen 7.5-325 MG per tablet  Commonly known as:  NORCO  Take 1-2 tablets by mouth every 4 (four) hours as needed for pain.     methocarbamol 500 MG tablet  Commonly known as:  ROBAXIN  Take 1 tablet (500 mg total) by mouth every 6 (six) hours as needed (muscle spasms).     omeprazole 20 MG capsule  Commonly known as:  PRILOSEC  Take 20 mg by mouth daily.     pyridOXINE 100 MG tablet  Commonly known as:  VITAMIN B-6  Take 100 mg by mouth daily.     traZODone 50 MG tablet  Commonly known as:  DESYREL  Take 50 mg by mouth at bedtime.     vitamin C 1000 MG  tablet  Take 1,000 mg by mouth daily.         Signed: Anastasio Auerbach. Adarius Tigges   PAC  06/08/2012, 5:40 PM

## 2012-06-23 ENCOUNTER — Inpatient Hospital Stay (HOSPITAL_COMMUNITY)
Admission: AD | Admit: 2012-06-23 | Discharge: 2012-06-30 | DRG: 464 | Disposition: A | Discharge status: Skilled Nursing Facility | Payer: Medicare Other | Source: Ambulatory Visit | Attending: Orthopedic Surgery | Admitting: Orthopedic Surgery

## 2012-06-23 ENCOUNTER — Encounter (HOSPITAL_COMMUNITY): Payer: Self-pay

## 2012-06-23 DIAGNOSIS — J4489 Other specified chronic obstructive pulmonary disease: Secondary | ICD-10-CM | POA: Diagnosis present

## 2012-06-23 DIAGNOSIS — Z87891 Personal history of nicotine dependence: Secondary | ICD-10-CM

## 2012-06-23 DIAGNOSIS — J449 Chronic obstructive pulmonary disease, unspecified: Secondary | ICD-10-CM | POA: Diagnosis present

## 2012-06-23 DIAGNOSIS — Z96659 Presence of unspecified artificial knee joint: Secondary | ICD-10-CM

## 2012-06-23 DIAGNOSIS — S88919A Complete traumatic amputation of unspecified lower leg, level unspecified, initial encounter: Secondary | ICD-10-CM

## 2012-06-23 DIAGNOSIS — L02419 Cutaneous abscess of limb, unspecified: Secondary | ICD-10-CM | POA: Diagnosis present

## 2012-06-23 DIAGNOSIS — Y835 Amputation of limb(s) as the cause of abnormal reaction of the patient, or of later complication, without mention of misadventure at the time of the procedure: Secondary | ICD-10-CM | POA: Diagnosis present

## 2012-06-23 DIAGNOSIS — Z8673 Personal history of transient ischemic attack (TIA), and cerebral infarction without residual deficits: Secondary | ICD-10-CM

## 2012-06-23 DIAGNOSIS — T8740 Infection of amputation stump, unspecified extremity: Principal | ICD-10-CM | POA: Diagnosis present

## 2012-06-23 DIAGNOSIS — I4891 Unspecified atrial fibrillation: Secondary | ICD-10-CM | POA: Diagnosis present

## 2012-06-23 DIAGNOSIS — IMO0002 Reserved for concepts with insufficient information to code with codable children: Secondary | ICD-10-CM | POA: Diagnosis present

## 2012-06-23 LAB — BASIC METABOLIC PANEL
Chloride: 102 mEq/L (ref 96–112)
GFR calc Af Amer: >90 mL/min (ref >90)
GFR calc non Af Amer: >90 mL/min (ref >90)
Potassium: 4 mEq/L (ref 3.5–5.1)
Sodium: 139 mEq/L (ref 135–145)

## 2012-06-23 LAB — CBC
HCT: 32.3 % — ABNORMAL LOW (ref 36.0–46.0)
Hemoglobin: 11.2 g/dL — ABNORMAL LOW (ref 12.0–15.0)
MCHC: 34.7 g/dL (ref 30.0–36.0)
RBC: 3.08 MIL/uL — ABNORMAL LOW (ref 3.87–5.11)
WBC: 5.8 10*3/uL (ref 4.0–10.5)

## 2012-06-23 MED ORDER — ONDANSETRON HCL 4 MG PO TABS
4.0000 mg | ORAL_TABLET | Freq: Four times a day (QID) | ORAL | Status: DC | PRN
  Administered 2012-06-23 – 2012-06-25 (×2): 4 mg via ORAL
  Filled 2012-06-23 (×2): qty 1

## 2012-06-23 MED ORDER — VITAMIN B-6 100 MG PO TABS
100.0000 mg | ORAL_TABLET | Freq: Every day | ORAL | Status: DC
  Administered 2012-06-23 – 2012-06-30 (×8): 100 mg via ORAL
  Filled 2012-06-23 (×8): qty 1

## 2012-06-23 MED ORDER — DOCUSATE SODIUM 100 MG PO CAPS
100.0000 mg | ORAL_CAPSULE | Freq: Two times a day (BID) | ORAL | Status: DC
  Administered 2012-06-23 – 2012-06-30 (×14): 100 mg via ORAL

## 2012-06-23 MED ORDER — PANTOPRAZOLE SODIUM 40 MG PO TBEC
40.0000 mg | DELAYED_RELEASE_TABLET | Freq: Every day | ORAL | Status: DC
  Administered 2012-06-23 – 2012-06-30 (×8): 40 mg via ORAL
  Filled 2012-06-23 (×8): qty 1

## 2012-06-23 MED ORDER — HYDROCODONE-ACETAMINOPHEN 7.5-325 MG PO TABS
1.0000 | ORAL_TABLET | ORAL | Status: DC | PRN
  Administered 2012-06-23 – 2012-06-30 (×31): 2 via ORAL
  Filled 2012-06-23 (×31): qty 2

## 2012-06-23 MED ORDER — HYDROCODONE-ACETAMINOPHEN 5-325 MG PO TABS: 1.0000 | ORAL_TABLET | ORAL | Status: DC | PRN

## 2012-06-23 MED ORDER — AMOXICILLIN-POT CLAVULANATE 875-125 MG PO TABS
1.0000 | ORAL_TABLET | Freq: Two times a day (BID) | ORAL | Status: DC
  Administered 2012-06-23 – 2012-06-24 (×3): 1 via ORAL
  Filled 2012-06-23 (×5): qty 1

## 2012-06-23 MED ORDER — FLEET ENEMA 7-19 GM/118ML RE ENEM: 1.0000 | ENEMA | Freq: Once | RECTAL | Status: AC | PRN

## 2012-06-23 MED ORDER — AMIODARONE HCL 200 MG PO TABS
200.0000 mg | ORAL_TABLET | Freq: Every day | ORAL | Status: DC
  Administered 2012-06-23 – 2012-06-29 (×7): 200 mg via ORAL
  Filled 2012-06-23 (×8): qty 1

## 2012-06-23 MED ORDER — DIPHENHYDRAMINE HCL 25 MG PO CAPS
50.0000 mg | ORAL_CAPSULE | Freq: Every evening | ORAL | Status: DC | PRN
  Administered 2012-06-23: 25 mg via ORAL
  Administered 2012-06-26: 50 mg via ORAL
  Filled 2012-06-23: qty 2
  Filled 2012-06-23: qty 1

## 2012-06-23 MED ORDER — DOXYCYCLINE HYCLATE 100 MG PO TABS
100.0000 mg | ORAL_TABLET | Freq: Two times a day (BID) | ORAL | Status: DC
  Administered 2012-06-23 – 2012-06-24 (×3): 100 mg via ORAL
  Filled 2012-06-23 (×4): qty 1

## 2012-06-23 MED ORDER — FLUTICASONE FUROATE-VILANTEROL 100-25 MCG/INH IN AEPB: 1.0000 | INHALATION_SPRAY | Freq: Every morning | RESPIRATORY_TRACT | Status: DC

## 2012-06-23 MED ORDER — FLUTICASONE FUROATE-VILANTEROL 100-25 MCG/INH IN AEPB: 1.0000 | INHALATION_SPRAY | Freq: Every day | RESPIRATORY_TRACT | Status: DC | PRN

## 2012-06-23 MED ORDER — METHOCARBAMOL 500 MG PO TABS
500.0000 mg | ORAL_TABLET | Freq: Four times a day (QID) | ORAL | Status: DC | PRN
  Administered 2012-06-23 – 2012-06-30 (×10): 500 mg via ORAL
  Filled 2012-06-23 (×10): qty 1

## 2012-06-23 MED ORDER — DABIGATRAN ETEXILATE MESYLATE 150 MG PO CAPS
150.0000 mg | ORAL_CAPSULE | Freq: Two times a day (BID) | ORAL | Status: DC
  Administered 2012-06-23 – 2012-06-26 (×7): 150 mg via ORAL
  Filled 2012-06-23 (×10): qty 1

## 2012-06-23 MED ORDER — ONDANSETRON HCL 4 MG/2ML IJ SOLN: 4.0000 mg | Freq: Four times a day (QID) | INTRAMUSCULAR | Status: DC | PRN

## 2012-06-23 MED ORDER — ZOLPIDEM TARTRATE 5 MG PO TABS: 5.0000 mg | ORAL_TABLET | Freq: Every evening | ORAL | Status: DC | PRN

## 2012-06-23 MED ORDER — ALUM & MAG HYDROXIDE-SIMETH 200-200-20 MG/5ML PO SUSP: 30.0000 mL | Freq: Four times a day (QID) | ORAL | Status: DC | PRN

## 2012-06-23 MED ORDER — TRAZODONE HCL 50 MG PO TABS
50.0000 mg | ORAL_TABLET | Freq: Every day | ORAL | Status: DC
  Administered 2012-06-23 – 2012-06-29 (×7): 50 mg via ORAL
  Filled 2012-06-23 (×8): qty 1

## 2012-06-23 MED ORDER — POLYETHYLENE GLYCOL 3350 17 G PO PACK: 17.0000 g | PACK | Freq: Every day | ORAL | Status: DC | PRN

## 2012-06-23 MED ORDER — BISACODYL 10 MG RE SUPP: 10.0000 mg | Freq: Every day | RECTAL | Status: DC | PRN

## 2012-06-23 MED ORDER — CELECOXIB 200 MG PO CAPS
200.0000 mg | ORAL_CAPSULE | Freq: Every day | ORAL | Status: DC
  Administered 2012-06-23 – 2012-06-29 (×7): 200 mg via ORAL
  Filled 2012-06-23 (×9): qty 1

## 2012-06-23 NOTE — Care Management Note (Unsigned)
    Page 1 of 1   06/23/2012     4:45:53 PM   CARE MANAGEMENT NOTE 06/23/2012  Patient:  [name], [name]   Account Number:  [id number]  Date Initiated:  06/23/2012  Documentation initiated by:  Colleen Can  Subjective/Objective Assessment:   Pt states revision ampoutation wound is not healing     Action/Plan:   States she is active with [facility] with [facility] services.   Anticipated DC Date:  06/26/2012   Anticipated DC Plan:  HOME W HOME HEALTH SERVICES      DC Planning Services  CM consult      Choice offered to / List presented to:             Status of service:  In process, will continue to follow Medicare Important Message given?   (If response is "NO", the following Medicare IM given date fields will be blank) Date Medicare IM given:   Date Additional Medicare IM given:    Discharge Disposition:    Per UR Regulation:    If discussed at Long Length of Stay Meetings, dates discussed:    Comments:

## 2012-06-24 DIAGNOSIS — L0291 Cutaneous abscess, unspecified: Secondary | ICD-10-CM

## 2012-06-24 MED ORDER — CEFAZOLIN SODIUM 1-5 GM-% IV SOLN
1.0000 g | Freq: Three times a day (TID) | INTRAVENOUS | Status: DC
  Administered 2012-06-24 – 2012-06-30 (×18): 1 g via INTRAVENOUS
  Filled 2012-06-24 (×20): qty 50

## 2012-06-24 NOTE — Progress Notes (Addendum)
ANTIBIOTIC CONSULT NOTE - INITIAL  Pharmacy Consult for Ancef Indication: Cellulitis  Allergies  Allergen Reactions  . Sulfa Antibiotics Anaphylaxis and Shortness Of Breath  . Avelox (Moxifloxacin Hcl In Nacl) Diarrhea and Nausea And Vomiting  . Tizanidine     UTI  . Penicillins Hives and Rash    Patient Measurements: Height: 5\' 11"  (180.3 cm) Weight: 165 lb (74.844 kg) IBW/kg (Calculated) : 70.8 Adjusted Body Weight:   Vital Signs: Temp: 98 F (36.7 C) (05/17 0445) Temp src: Oral (05/17 0445) BP: 98/62 mmHg (05/17 0445) Pulse Rate: 56 (05/17 0445) Intake/Output from previous day: 05/16 0701 - 05/17 0700 In: 240 [P.O.:240] Out: -  Intake/Output from this shift: Total I/O In: 480 [P.O.:480] Out: -   Labs:  Recent Labs  06/23/12 1115  WBC 5.8  HGB 11.2*  PLT 317  CREATININE 0.61   Estimated Creatinine Clearance: 77.3 ml/min (by C-G formula based on Cr of 0.61). No results found for this basename: VANCOTROUGH, [name], VANCORANDOM, GENTTROUGH, GENTPEAK, GENTRANDOM, TOBRATROUGH, TOBRAPEAK, TOBRARND, AMIKACINPEAK, AMIKACINTROU, AMIKACIN,  in the last 72 hours   Microbiology: Recent Results (from the past 720 hour(s))  SURGICAL PCR SCREEN     Status: None   Collection Time    05/31/12 12:10 PM      Result Value Range Status   MRSA, PCR NEGATIVE  NEGATIVE Final   Staphylococcus aureus NEGATIVE  NEGATIVE Final   Comment:            The Xpert SA Assay (FDA     approved for NASAL specimens     in patients over 62 years of age),     is one component of     a comprehensive surveillance     program.  Test performance has     been validated by [employer] for patients greater     than or equal to 42 year old.     It is not intended     to diagnose infection nor to     guide or monitor treatment.    Medical History: Past Medical History  Diagnosis Date  . Emphysema   . Atrial fibrillation   . COPD (chronic obstructive pulmonary disease)   . History  of atrial fibrillation     S/pP Ablation  . Asthma   . Arthritis   . Osteoarthritis   . Rheumatoid arthritis   . Anemia   . B12 deficiency anemia   . Stroke     slight stroke in 2009-no deficits  . Neuromuscular disorder     hx of right quadricep rupture  . DJD (degenerative joint disease)   . History of benign bladder tumor   . DVT (deep venous thrombosis)   . Blood transfusion     2009   . Pneumonia     hx of  . GERD (gastroesophageal reflux disease)   . CHF (congestive heart failure)     Assessment: 67 yoF s/p revision of right AKA [name], [name] with cellulitis at amputation site.  ID has recommended patient start on IV ancef per pharmacy.   Pt has allergies to sulfa (anaphylaxis), penicillins (hives/rash) but has tolerated augmentin this admission.     SCr 0.61, stable.  CrCl ~77 ml/min  WBC WNL  Afebrile  No cultures  Plan:  1.  Ancef 1 gram IV q 8 hours 2.  F/u renal function, clinical course  Haynes Hoehn, PharmD 06/24/2012 11:33 AM  Pager: [phone]

## 2012-06-24 NOTE — Consult Note (Signed)
[facility]     Reason for Consult:celllulitis    Referring Physician: Dr. Charlann Boxer  Active Problems:   * No active hospital problems. *   . amiodarone  200 mg Oral QHS  .  ceFAZolin (ANCEF) IV  1 g Intravenous Q8H  . celecoxib  200 mg Oral QHS  . dabigatran  150 mg Oral Q12H  . docusate sodium  100 mg Oral BID  . pantoprazole  40 mg Oral Daily  . pyridOXINE  100 mg Oral Daily  . traZODone  50 mg Oral QHS    Recommendations: Ancef IV   Assessment: She has cellulitis, some drainage, no response to doxycycline.  Recent surgery at stump for neuroma formation.     Antibiotics: Doxycycline amoxicillin  HPI: [name] is a 67 y.o. female with history of AKA and was getting a prosthetic leg but had difficulty with comfort and in April this year went to OR for painful neuroma.  She has had trouble since with cellulitis and some serosanguinous drainage at mid area and erythema.  She was placed on doxycycline but did not respond well.  She was sent in for IV antibiotics.   Her past cultures include CoNS that is oxacillin sensitive in leg and E coli in urine.  She is otherwise Staph aureus swab negative.     Review of Systems: Pertinent items are noted in HPI.  Past Medical History  Diagnosis Date  . Emphysema   . Atrial fibrillation   . COPD (chronic obstructive pulmonary disease)   . History of atrial fibrillation     S/pP Ablation  . Asthma   . Arthritis   . Osteoarthritis   . Rheumatoid arthritis   . Anemia   . B12 deficiency anemia   . Stroke     slight stroke in 2009-no deficits  . Neuromuscular disorder     hx of right quadricep rupture  . DJD (degenerative joint disease)   . History of benign bladder tumor   . DVT (deep venous thrombosis)   . Blood transfusion     2009   . Pneumonia     hx of  . GERD (gastroesophageal reflux disease)   . CHF (congestive heart failure)     History  Substance Use Topics  . Smoking  status: Former Smoker -- 1.50 packs/day for 22 years    Types: Cigarettes    Quit date: 02/09/1996  . Smokeless tobacco: Never Used  . Alcohol Use: No    Family History  Problem Relation Age of Onset  . Achondroplasia Father   . Congenital heart disease Mother   . Hypertension Mother   . Hypertension Sister    Allergies  Allergen Reactions  . Sulfa Antibiotics Anaphylaxis and Shortness Of Breath  . Avelox (Moxifloxacin Hcl In Nacl) Diarrhea and Nausea And Vomiting  . Tizanidine     UTI  . Penicillins Hives and Rash    OBJECTIVE: Blood pressure 98/62, pulse 55, temperature 97.2 F (36.2 C), temperature source Oral, resp. rate 16, height 5\' 11"  (1.803 m), weight 165 lb (74.844 kg), SpO2 97.00%. General: AAO x 3, nad Skin: right stump with staples, erythema at dorsal position, warm.  Dried pustule anterior.  Serosanguinous drainage in mid portion Lungs: CTA B Cor: RRR without mr/g Abdomen: soft, nt, nd, +bs   Microbiology: No results found for this or any previous visit (from the past 240 hour(s)).  Staci Righter, MD [facility]  [facility] [phone] pager  [phone] cell 06/24/2012, 2:42 PM

## 2012-06-24 NOTE — Progress Notes (Signed)
Subjective: Pain well controlled. Tolerated PO's well. Passing flatus. Patients is in good spirits. Denies SOB, CP, or Left calf pain.    Objective: Vital signs in last 24 hours: Temp:  [97.8 F (36.6 C)-98.6 F (37 C)] 98 F (36.7 C) (05/17 0445) Pulse Rate:  [56-64] 56 (05/17 0445) Resp:  [16-18] 16 (05/16 2124) BP: (97-134)/(61-68) 98/62 mmHg (05/17 0445) SpO2:  [93 %-95 %] 94 % (05/17 0445) Weight:  [74.844 kg (165 lb)] 74.844 kg (165 lb) (05/16 1351)  Intake/Output from previous day: 05/16 0701 - 05/17 0700 In: 240 [P.O.:240] Out: -  Intake/Output this shift:     Recent Labs  06/23/12 1115  HGB 11.2*    Recent Labs  06/23/12 1115  WBC 5.8  RBC 3.08*  HCT 32.3*  PLT 317    Recent Labs  06/23/12 1115  NA 139  K 4.0  CL 102  CO2 27  BUN 11  CREATININE 0.61  GLUCOSE 100*  CALCIUM 9.5   No results found for this basename: LABPT, INR,  in the last 72 hours  Well nourished. Alert and oriented. Left calf soft and non-tender. Left Le neurovascularly intact. Right Amputation site well approxmated with staples. Noted spreading redness at this site.   Assessment/Plan: Cellulitis of right Amputation site. Consult ID for IV ABX.  Monitor through weekend.    Carsyn Boster L 06/24/2012, 8:33 AM

## 2012-06-24 NOTE — Plan of Care (Signed)
Problem: Phase I Progression Outcomes Goal: Initial discharge plan identified Outcome: Not Met (add Reason) on iv abx

## 2012-06-25 NOTE — Progress Notes (Signed)
Subjective:     Patient reports pain as 1 on 0-10 scale. Wound site looks excellent.    Objective: Vital signs in last 24 hours: Temp:  [97.2 F (36.2 C)-98 F (36.7 C)] 98 F (36.7 C) (05/18 0455) Pulse Rate:  [55-68] 68 (05/18 0455) Resp:  [16] 16 (05/18 0455) BP: (98-104)/(62-69) 100/63 mmHg (05/18 0455) SpO2:  [92 %-97 %] 92 % (05/18 0455)  Intake/Output from previous day: 05/17 0701 - 05/18 0700 In: 1250 [P.O.:1200; IV Piggyback:50] Out: 700 [Urine:700] Intake/Output this shift:     Recent Labs  06/23/12 1115  HGB 11.2*    Recent Labs  06/23/12 1115  WBC 5.8  RBC 3.08*  HCT 32.3*  PLT 317    Recent Labs  06/23/12 1115  NA 139  K 4.0  CL 102  CO2 27  BUN 11  CREATININE 0.61  GLUCOSE 100*  CALCIUM 9.5   No results found for this basename: LABPT, INR,  in the last 72 hours  No cellulitis present  Assessment/Plan:     Discharge to SNF  Suzanne Kho A 06/25/2012, 7:57 AM

## 2012-06-25 NOTE — Progress Notes (Signed)
    [facility]  Date of Admission:  06/23/2012  Antibiotics: Cefazolin day 2  Subjective: Feels warmer to her  Objective: Temp:  [97.2 F (36.2 C)-98 F (36.7 C)] 98 F (36.7 C) (05/18 0455) Pulse Rate:  [55-68] 68 (05/18 0455) Resp:  [16] 16 (05/18 0455) BP: (98-104)/(62-69) 100/63 mmHg (05/18 0455) SpO2:  [92 %-97 %] 92 % (05/18 0455)  General: Awake, in chair, nad Skin: leg with similar erythema, warmth, no spread, mild serosanquinous discharge   Lab Results Lab Results  Component Value Date   WBC 5.8 06/23/2012   HGB 11.2* 06/23/2012   HCT 32.3* 06/23/2012   MCV 104.9* 06/23/2012   PLT 317 06/23/2012    Lab Results  Component Value Date   CREATININE 0.61 06/23/2012   BUN 11 06/23/2012   NA 139 06/23/2012   K 4.0 06/23/2012   CL 102 06/23/2012   CO2 27 06/23/2012    Lab Results  Component Value Date   ALT 15 12/19/2010   AST 35 12/19/2010   ALKPHOS 79 12/19/2010   BILITOT 0.3 12/19/2010      Microbiology: No results found for this or any previous visit (from the past 240 hour(s)).  Studies/Results: No results found.  Assessment/Plan: 1)  Cellulitis - stable, continue cefazolin.    Staci Righter, MD [facility] [phone] pager   06/25/2012, 12:09 PM

## 2012-06-26 MED ORDER — HYDROMORPHONE HCL PF 1 MG/ML IJ SOLN
0.5000 mg | INTRAMUSCULAR | Status: DC | PRN
  Administered 2012-06-26 – 2012-06-27 (×5): 1 mg via INTRAVENOUS
  Filled 2012-06-26 (×6): qty 1

## 2012-06-26 MED ORDER — NYSTATIN 100000 UNIT/GM EX POWD
Freq: Two times a day (BID) | CUTANEOUS | Status: DC
  Administered 2012-06-26: 1 via TOPICAL
  Administered 2012-06-27 – 2012-06-30 (×7): via TOPICAL
  Filled 2012-06-26: qty 15

## 2012-06-26 MED ORDER — CYCLOBENZAPRINE HCL 10 MG PO TABS
10.0000 mg | ORAL_TABLET | Freq: Three times a day (TID) | ORAL | Status: DC | PRN
  Administered 2012-06-26 – 2012-06-29 (×3): 10 mg via ORAL
  Filled 2012-06-26 (×3): qty 1

## 2012-06-26 NOTE — H&P (Signed)
[name] is an 67 y.o. female.    Chief Complaint:    Cellulitis with drainage of the distal right leg stump   HPI: Pt is a 67 y.o. female complaining of redness and drainage of the distal right stump.  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 She had been doing well, but home health nurse started to notice redness over the distal portion of the stump and some drainage from the otherwise healing incision. Once seen in the clinic various options were discussed. She will be admitted to the hospital for IV antibiotics and possible further treatment modalities. Patient understand the risks, benefits and expectations and wishes to proceed.   PCP:  Marylen Ponto, MD  D/C Plans:  Home with HHPT  Post-op Meds:     No Rx given   Tranexamic Acid:   Not to be given - CAD  Decadron:   Not to be given  PMH: Past Medical History  Diagnosis Date  . Emphysema   . Atrial fibrillation   . COPD (chronic obstructive pulmonary disease)   . History of atrial fibrillation     S/pP Ablation  . Asthma   . Arthritis   . Osteoarthritis   . Rheumatoid arthritis   . Anemia   . B12 deficiency anemia   . Stroke     slight stroke in 2009-no deficits  . Neuromuscular disorder     hx of right quadricep rupture  . DJD (degenerative joint disease)   . History of benign bladder tumor   . DVT (deep venous thrombosis)   . Blood transfusion     2009   . Pneumonia     hx of  . GERD (gastroesophageal reflux disease)   . CHF (congestive heart failure)     PSH: Past Surgical History  Procedure Laterality Date  . Replacement total knee    . Cardiac electrophysiology mapping and ablation    . Cholecystectomy  1996  . Cataract extraction, bilateral    . Hematoma evacuation  2009    From right groin  . Joint replacement      Left-1996, Right-1995  . Multiple tooth extractions  06/30/2010  . Trigger finger release  2011  . Amputation  12/17/2010    Procedure:  AMPUTATION ABOVE KNEE;  Surgeon: [name];  Location: [facility];  Service: Orthopedics;  Laterality: Right;  . Application of wound vac  12/17/2010    Procedure: APPLICATION OF WOUND VAC;  Surgeon: [name];  Location: [facility];  Service: Orthopedics;  Laterality: Right;  wound Vac applied @ 2045 By Luna Fuse  . Amputation  04/27/2011    Procedure: AMPUTATION ABOVE KNEE;  Surgeon: [name], MD;  Location: [facility];  Service: Orthopedics;  Laterality: Right;  Revision Right above knee amputation.  . Cardiac catheterization    . Cardioversion      x5 times  . Amputation  02/28/2012    Procedure: AMPUTATION BELOW KNEE;  Surgeon: [name], MD;  Location: [facility];  Service: Orthopedics;  Laterality: Right;  SCAR REVISION  . Tenosynovectomy      left long and ring fingers on 04/19/12  . Amputation Right 06/06/2012    Procedure: REVISION ABOVE RIGHT KNEE AMPUTATION, STUMP ;  Surgeon: [name], MD;  Location: [facility];  Service: Orthopedics;  Laterality: Right;    Social History:  reports that she quit smoking about 16 years ago. Her smoking use  included Cigarettes. She has a 33 pack-year smoking history. She has never used smokeless tobacco. She reports that she does not drink alcohol or use illicit drugs.  Allergies:  Allergies  Allergen Reactions  . Sulfa Antibiotics Anaphylaxis and Shortness Of Breath  . Avelox (Moxifloxacin Hcl In Nacl) Diarrhea and Nausea And Vomiting  . Tizanidine     UTI  . Penicillins Hives and Rash    Medications: Current Facility-Administered Medications  Medication Dose Route Frequency Provider Last Rate Last Dose  . alum & mag hydroxide-simeth (MAALOX/MYLANTA) 200-200-20 MG/5ML suspension 30 mL  30 mL Oral Q6H PRN Genelle Gather Kennya Schwenn, PA-C      . amiodarone (PACERONE) tablet 200 mg  200 mg Oral QHS Genelle Gather [place], PA-C   200 mg at 06/25/12 2115  . bisacodyl (DULCOLAX) suppository 10 mg  10 mg Rectal Daily PRN Genelle Gather Jakota Manthei, PA-C        . ceFAZolin (ANCEF) IVPB 1 g/50 mL premix  1 g Intravenous Q8H Colleen E Summe, RPH   1 g at 06/26/12 0603  . celecoxib (CELEBREX) capsule 200 mg  200 mg Oral QHS Genelle Gather Prien, PA-C   200 mg at 06/25/12 2115  . dabigatran (PRADAXA) capsule 150 mg  150 mg Oral Q12H Genelle Gather [place], PA-C   150 mg at 06/25/12 2115  . diphenhydrAMINE (BENADRYL) capsule 50 mg  50 mg Oral QHS PRN Genelle Gather Jaimere Feutz, PA-C   50 mg at 06/26/12 0820  . docusate sodium (COLACE) capsule 100 mg  100 mg Oral BID Genelle Gather Clarinda Obi, PA-C   100 mg at 06/25/12 2114  . Fluticasone Furoate-Vilanterol 100-25 MCG/INH AEPB 1 puff  1 puff Inhalation Daily PRN [name], MD      . HYDROcodone-acetaminophen [facility]) 7.5-325 MG per tablet 1-2 tablet  1-2 tablet Oral Q4H PRN Genelle Gather Adin Laker, PA-C   2 tablet at 06/26/12 [phone]  . methocarbamol (ROBAXIN) tablet 500 mg  500 mg Oral Q6H PRN Genelle Gather Alany Borman, PA-C   500 mg at 06/26/12 1191  . ondansetron (ZOFRAN) tablet 4 mg  4 mg Oral Q6H PRN Genelle Gather Kadence Mikkelson, PA-C   4 mg at 06/25/12 4782   Or  . ondansetron [facility]) injection 4 mg  4 mg Intravenous Q6H PRN Genelle Gather Chidera Thivierge, PA-C      . pantoprazole (PROTONIX) EC tablet 40 mg  40 mg Oral Daily Genelle Gather Klare Criss, PA-C   40 mg at 06/25/12 1050  . polyethylene glycol (MIRALAX / GLYCOLAX) packet 17 g  17 g Oral Daily PRN Genelle Gather Quandra Fedorchak, PA-C      . pyridOXINE (VITAMIN B-6) tablet 100 mg  100 mg Oral Daily Genelle Gather Goldye Tourangeau, PA-C   100 mg at 06/25/12 1050  . traZODone (DESYREL) tablet 50 mg  50 mg Oral QHS Genelle Gather Marlin, PA-C   50 mg at 06/25/12 2115    Review of Systems  Constitutional: Negative.  HENT: Positive for tinnitus.  Eyes: Negative.  Respiratory: Positive for cough.  Cardiovascular: Negative.  Gastrointestinal: Positive for heartburn.  Genitourinary: Negative.  Musculoskeletal: Positive for myalgias (muscle spasms, right AKA) and joint pain.  Skin:  Redness and cellulitis on  distal portion of right stump.  Neurological: Negative.  Endo/Heme/Allergies: Positive for environmental allergies.  Psychiatric/Behavioral: Negative.    Physical Exam  Constitutional: She is oriented to person, place, and time and well-developed, well-nourished, and in no distress.  HENT:  Head: Normocephalic and atraumatic.  Mouth/Throat: Oropharynx is clear and moist.  She has dentures.  Eyes: Pupils are equal, round, and reactive to light.  Neck: Neck supple. No JVD present. No tracheal deviation present. No thyromegaly present.  Cardiovascular: Normal rate, regular rhythm, normal heart sounds and intact distal pulses.  Pulmonary/Chest: Effort normal and breath sounds normal. No stridor. No respiratory distress.  Abdominal: There is no tenderness. There is no guarding.  Musculoskeletal:       Legs: Redness of the distal portion of the right stump. Drainage from the central area of the incision from the previous surgery.  Rest appears to be healing well. Lymphadenopathy:       She has no cervical adenopathy.  Neurological: She is alert and oriented to person, place, and time.  Skin:   Redness and cellulitis on distal portion of right stump. Marland Kitchen  Psychiatric: Affect normal.     Assessment/Plan Assessment:   Cellulitis with drainage of the distal right leg stump  Plan: Patient will be admitted to the hospital to receive IV antibiotics and ID consult on 06/23/2012 per Dr. Charlann Boxer at [facility]. If the antibiotics don't fully clear the area, she may need to be brought to the OR to I&D the stump/  Risks benefits and expectations were discussed with the patient. Patient understand risks, benefits and expectations and wishes to proceed.   Anastasio Auerbach Apolonia Ellwood   PAC  06/26/2012, 9:57 AM

## 2012-06-26 NOTE — Progress Notes (Signed)
Clinical Social Work Department CLINICAL SOCIAL WORK PLACEMENT NOTE 06/26/2012  Patient:  [name], [name]  Account Number:  [id number] Admit date:  06/23/2012  Clinical Social Worker:  Cori Razor, LCSW  Date/time:  06/26/2012 02:21 PM  Clinical Social Work is seeking post-discharge placement for this patient at the following level of care:   SKILLED NURSING   (*CSW will update this form in Epic as items are completed)     Patient/family provided with [facility] Department of Clinical Social Work's list of facilities offering this level of care within the geographic area requested by the patient (or if unable, by the patient's family).  06/26/2012  Patient/family informed of their freedom to choose among providers that offer the needed level of care, that participate in Medicare, Medicaid or managed care program needed by the patient, have an available bed and are willing to accept the patient.    Patient/family informed of [facility]' ownership interest in [facility], as well as of the fact that they are under no obligation to receive care at this facility.  PASARR submitted to EDS on  PASARR number received from [employer] on 06/05/2007  FL2 transmitted to all facilities in geographic area requested by pt/family on  06/26/2012 FL2 transmitted to all facilities within larger geographic area on   Patient informed that his/her managed care company has contracts with or will negotiate with  certain facilities, including the following:     Patient/family informed of bed offers received:  06/26/2012 Patient chooses bed at [facility], [place] Physician recommends and patient chooses bed at    Patient to be transferred to [facility], [place] on   Patient to be transferred to facility by   The following physician request were entered in Epic:   Additional Comments:  Cori Razor LCSW [phone]

## 2012-06-26 NOTE — Progress Notes (Signed)
Clinical Social Work Department BRIEF PSYCHOSOCIAL ASSESSMENT 06/26/2012  Patient:  [name], [name]     Account Number:  [id number]     Admit date:  06/23/2012  Clinical Social Worker:  Candie Chroman  Date/Time:  06/26/2012 02:10 PM  Referred by:  Physician  Date Referred:  06/25/2012 Referred for  SNF Placement   Other Referral:   Interview type:  Patient Other interview type:    PSYCHOSOCIAL DATA Living Status:  ALONE Admitted from facility:   Level of care:   Primary support name:  Trude Mcburney Primary support relationship to patient:  SIBLING Degree of support available:   supportive    CURRENT CONCERNS Current Concerns  Post-Acute Placement   Other Concerns:    SOCIAL WORK ASSESSMENT / PLAN Pt is a 35 tr old female living at home prior to hospitalization. CSW met with pt to assist with d/c planning. Pt feels ST Rehab is needd following hospital d/c. SNF search ahs been initiated and pt has chosen [facility] for placement. SNF has been contacted and is able to admit when pt is stable for d/c.   Assessment/plan status:  Psychosocial Support/Ongoing Assessment of Needs Other assessment/ plan:   Information/referral to community resources:   None needed at this time.    PATIENT'S/FAMILY'S RESPONSE TO PLAN OF CARE: Pt feels she needs a short time in rehab before returning home. She has been at several rehabs in the past and has requested [facility] for rehab.    Cori Razor LCSW [phone]

## 2012-06-26 NOTE — Progress Notes (Signed)
    [facility]  Date of Admission:  06/23/2012  Antibiotics: Cefazolin day 3  Subjective: Notices more fluid in stump  Objective: Temp:  [97.5 F (36.4 C)-97.8 F (36.6 C)] 97.5 F (36.4 C) (05/19 0608) Pulse Rate:  [59-69] 59 (05/19 0608) Resp:  [16-20] 20 (05/19 0608) BP: (96-106)/(60-65) 105/61 mmHg (05/19 0608) SpO2:  [93 %-94 %] 93 % (05/19 0608)  General: Awake, in chair, nad Skin: decreased erythema, no discharge noted   Lab Results Lab Results  Component Value Date   WBC 5.8 06/23/2012   HGB 11.2* 06/23/2012   HCT 32.3* 06/23/2012   MCV 104.9* 06/23/2012   PLT 317 06/23/2012    Lab Results  Component Value Date   CREATININE 0.61 06/23/2012   BUN 11 06/23/2012   NA 139 06/23/2012   K 4.0 06/23/2012   CL 102 06/23/2012   CO2 27 06/23/2012    Lab Results  Component Value Date   ALT 15 12/19/2010   AST 35 12/19/2010   ALKPHOS 79 12/19/2010   BILITOT 0.3 12/19/2010      Microbiology: No results found for this or any previous visit (from the past 240 hour(s)).  Studies/Results: No results found.  Assessment/Plan: 1)  Cellulitis - is improving on cefazolin. Continue with cefazolin, can d/c on Keflex 500 mg qid when ready for d/c.    Staci Righter, MD [facility] for Infectious Disease [facility] [phone] pager   06/26/2012, 11:05 AM

## 2012-06-26 NOTE — Progress Notes (Signed)
Patient ID: [name], female   DOB: 1945/05/27, 67 y.o.   MRN: [id number]  Stable Feels that there is persistent redness but not as warm  Right leg has persistent redness involving the medial aspect of her stump Persistent serous drainage at distal aspect of wound  After reviewing her chart we will continue to have her on the Ancef.  I will plan to take her back to the operating room either tomorrow night or Wednesday for the purposes of removing old hematoma to allow wound to heal faster.  I will take cultures though she has been on antibiotics now for couple of weeks first PO then IV

## 2012-06-27 ENCOUNTER — Encounter (HOSPITAL_COMMUNITY): Payer: Self-pay | Admitting: Anesthesiology

## 2012-06-27 ENCOUNTER — Encounter (HOSPITAL_COMMUNITY)
Admission: AD | Disposition: A | Discharge status: Skilled Nursing Facility | Payer: Self-pay | Source: Ambulatory Visit | Attending: Orthopedic Surgery

## 2012-06-27 ENCOUNTER — Encounter (HOSPITAL_COMMUNITY): Payer: Self-pay | Admitting: *Deleted

## 2012-06-27 ENCOUNTER — Inpatient Hospital Stay (HOSPITAL_COMMUNITY): Payer: Medicare Other | Admitting: Anesthesiology

## 2012-06-27 HISTORY — PX: I & D EXTREMITY: SHX5045

## 2012-06-27 SURGERY — IRRIGATION AND DEBRIDEMENT EXTREMITY
Anesthesia: General | Site: Leg Upper | Laterality: Right | Wound class: Dirty or Infected

## 2012-06-27 MED ORDER — DABIGATRAN ETEXILATE MESYLATE 150 MG PO CAPS
150.0000 mg | ORAL_CAPSULE | Freq: Two times a day (BID) | ORAL | Status: DC
  Administered 2012-06-28 – 2012-06-30 (×5): 150 mg via ORAL
  Filled 2012-06-27 (×7): qty 1

## 2012-06-27 MED ORDER — HYDROMORPHONE HCL PF 1 MG/ML IJ SOLN
INTRAMUSCULAR | Status: DC | PRN
  Administered 2012-06-27: 1 mg via INTRAVENOUS
  Administered 2012-06-27 (×2): 0.5 mg via INTRAVENOUS

## 2012-06-27 MED ORDER — PROPOFOL 10 MG/ML IV BOLUS
INTRAVENOUS | Status: DC | PRN
  Administered 2012-06-27: 50 mg via INTRAVENOUS
  Administered 2012-06-27: 150 mg via INTRAVENOUS
  Administered 2012-06-27: 50 mg via INTRAVENOUS

## 2012-06-27 MED ORDER — FENTANYL CITRATE 0.05 MG/ML IJ SOLN
INTRAMUSCULAR | Status: DC | PRN
  Administered 2012-06-27 (×5): 50 ug via INTRAVENOUS

## 2012-06-27 MED ORDER — ONDANSETRON HCL 4 MG/2ML IJ SOLN
INTRAMUSCULAR | Status: DC | PRN
  Administered 2012-06-27 (×2): 2 mg via INTRAVENOUS

## 2012-06-27 MED ORDER — EPHEDRINE SULFATE 50 MG/ML IJ SOLN
INTRAMUSCULAR | Status: DC | PRN
  Administered 2012-06-27: 10 mg via INTRAVENOUS
  Administered 2012-06-27 (×3): 5 mg via INTRAVENOUS

## 2012-06-27 MED ORDER — FENTANYL CITRATE 0.05 MG/ML IJ SOLN
25.0000 ug | INTRAMUSCULAR | Status: DC | PRN
  Administered 2012-06-27: 50 ug via INTRAVENOUS

## 2012-06-27 MED ORDER — SODIUM CHLORIDE 0.9 % IR SOLN
Status: DC | PRN
  Administered 2012-06-27: 6000 mL

## 2012-06-27 MED ORDER — PROMETHAZINE HCL 25 MG/ML IJ SOLN: 6.2500 mg | INTRAMUSCULAR | Status: DC | PRN

## 2012-06-27 MED ORDER — FLUTICASONE FUROATE-VILANTEROL 100-25 MCG/INH IN AEPB
1.0000 | INHALATION_SPRAY | Freq: Every day | RESPIRATORY_TRACT | Status: DC
  Administered 2012-06-27 – 2012-06-29 (×3): 1 via RESPIRATORY_TRACT

## 2012-06-27 MED ORDER — LIDOCAINE HCL (CARDIAC) 20 MG/ML IV SOLN
INTRAVENOUS | Status: DC | PRN
  Administered 2012-06-27: 50 mg via INTRAVENOUS

## 2012-06-27 MED ORDER — MIDAZOLAM HCL 5 MG/5ML IJ SOLN
INTRAMUSCULAR | Status: DC | PRN
  Administered 2012-06-27: 1 mg via INTRAVENOUS

## 2012-06-27 MED ORDER — 0.9 % SODIUM CHLORIDE (POUR BTL) OPTIME
TOPICAL | Status: DC | PRN
  Administered 2012-06-27: 1000 mL

## 2012-06-27 MED ORDER — DEXAMETHASONE SODIUM PHOSPHATE 10 MG/ML IJ SOLN
INTRAMUSCULAR | Status: DC | PRN
  Administered 2012-06-27: 10 mg via INTRAVENOUS

## 2012-06-27 MED ORDER — HYDROMORPHONE HCL PF 1 MG/ML IJ SOLN
0.5000 mg | INTRAMUSCULAR | Status: DC | PRN
  Administered 2012-06-27 – 2012-06-29 (×5): 1 mg via INTRAVENOUS
  Filled 2012-06-27 (×5): qty 1

## 2012-06-27 MED ORDER — MEPERIDINE HCL 50 MG/ML IJ SOLN: 6.2500 mg | INTRAMUSCULAR | Status: DC | PRN

## 2012-06-27 MED ORDER — LACTATED RINGERS IV SOLN
INTRAVENOUS | Status: DC | PRN
  Administered 2012-06-27 (×2): via INTRAVENOUS

## 2012-06-27 SURGICAL SUPPLY — 40 items
BAG ZIPLOCK 12X15 (MISCELLANEOUS) ×2 IMPLANT
BANDAGE ELASTIC 6 VELCRO ST LF (GAUZE/BANDAGES/DRESSINGS) ×2 IMPLANT
BANDAGE ESMARK 6X9 LF (GAUZE/BANDAGES/DRESSINGS) ×1 IMPLANT
BANDAGE GAUZE ELAST BULKY 4 IN (GAUZE/BANDAGES/DRESSINGS) ×2 IMPLANT
BNDG ESMARK 6X9 LF (GAUZE/BANDAGES/DRESSINGS) ×2
CLOTH BEACON ORANGE TIMEOUT ST (SAFETY) ×2 IMPLANT
CUFF TOURN SGL QUICK 18 (TOURNIQUET CUFF) IMPLANT
CUFF TOURN SGL QUICK 24 (TOURNIQUET CUFF)
CUFF TOURN SGL QUICK 34 (TOURNIQUET CUFF) ×1
CUFF TRNQT CYL 24X4X40X1 (TOURNIQUET CUFF) IMPLANT
CUFF TRNQT CYL 34X4X40X1 (TOURNIQUET CUFF) ×1 IMPLANT
DRAIN PENROSE 18X1/2 LTX STRL (DRAIN) IMPLANT
DRSG PAD ABDOMINAL 8X10 ST (GAUZE/BANDAGES/DRESSINGS) ×2 IMPLANT
DURAPREP 26ML APPLICATOR (WOUND CARE) ×2 IMPLANT
ELECT REM PT RETURN 9FT ADLT (ELECTROSURGICAL) ×2
ELECTRODE REM PT RTRN 9FT ADLT (ELECTROSURGICAL) ×1 IMPLANT
FRAME EYE SHIELD (PROTECTIVE WEAR) ×4 IMPLANT
GAUZE XEROFORM 5X9 LF (GAUZE/BANDAGES/DRESSINGS) ×2 IMPLANT
GLOVE BIOGEL PI IND STRL 7.5 (GLOVE) ×1 IMPLANT
GLOVE BIOGEL PI IND STRL 8 (GLOVE) ×1 IMPLANT
GLOVE BIOGEL PI INDICATOR 7.5 (GLOVE) ×1
GLOVE BIOGEL PI INDICATOR 8 (GLOVE) ×1
GLOVE ECLIPSE 8.0 STRL XLNG CF (GLOVE) IMPLANT
GLOVE ORTHO TXT STRL SZ7.5 (GLOVE) ×4 IMPLANT
GLOVE SURG ORTHO 8.0 STRL STRW (GLOVE) ×2 IMPLANT
GOWN BRE IMP PREV XXLGXLNG (GOWN DISPOSABLE) ×4 IMPLANT
GOWN STRL NON-REIN LRG LVL3 (GOWN DISPOSABLE) ×2 IMPLANT
HANDPIECE INTERPULSE COAX TIP (DISPOSABLE) ×1
KIT BASIN OR (CUSTOM PROCEDURE TRAY) ×2 IMPLANT
MANIFOLD NEPTUNE II (INSTRUMENTS) ×2 IMPLANT
PACK LOWER EXTREMITY WL (CUSTOM PROCEDURE TRAY) ×2 IMPLANT
PAD CAST 4YDX4 CTTN HI CHSV (CAST SUPPLIES) IMPLANT
PADDING CAST COTTON 4X4 STRL (CAST SUPPLIES)
POSITIONER SURGICAL ARM (MISCELLANEOUS) ×2 IMPLANT
SET HNDPC FAN SPRY TIP SCT (DISPOSABLE) ×1 IMPLANT
SOL PREP PROV IODINE SCRUB 4OZ (MISCELLANEOUS) IMPLANT
SPONGE GAUZE 4X4 12PLY (GAUZE/BANDAGES/DRESSINGS) IMPLANT
SUT ETHILON 2 0 PS N (SUTURE) ×6 IMPLANT
SYR CONTROL 10ML LL (SYRINGE) IMPLANT
TOWEL OR 17X26 10 PK STRL BLUE (TOWEL DISPOSABLE) ×6 IMPLANT

## 2012-06-27 NOTE — Anesthesia Postprocedure Evaluation (Signed)
  Anesthesia Post-op Note  Patient: [name]  Procedure(s) Performed: Procedure(s) (LRB): IRRIGATION AND DEBRIDEMENT hematoma right AKA (Right)  Patient Location: PACU  Anesthesia Type: General  Level of Consciousness: awake and alert   Airway and Oxygen Therapy: Patient Spontanous Breathing  Post-op Pain: mild  Post-op Assessment: Post-op Vital signs reviewed, Patient's Cardiovascular Status Stable, Respiratory Function Stable, Patent Airway and No signs of Nausea or vomiting  Last Vitals:  Filed Vitals:   06/27/12 1745  BP: 123/55  Pulse: 78  Temp:   Resp: 17    Post-op Vital Signs: stable   Complications: No apparent anesthesia complications

## 2012-06-27 NOTE — Transfer of Care (Signed)
Immediate Anesthesia Transfer of Care Note  Patient: [name]  Procedure(s) Performed: Procedure(s): IRRIGATION AND DEBRIDEMENT hematoma right AKA (Right)  Patient Location: PACU  Anesthesia Type:General  Level of Consciousness: awake, alert , oriented and patient cooperative  Airway & Oxygen Therapy: Patient Spontanous Breathing and Patient connected to face mask oxygen  Post-op Assessment: Report given to PACU RN, Post -op Vital signs reviewed and stable and Patient moving all extremities  Post vital signs: Reviewed and stable  Complications: No apparent anesthesia complications

## 2012-06-27 NOTE — Progress Notes (Signed)
With cancellation of case today we have decided with [name] to proceed with her procedure today  She was made NPO last night in case we were able to  Questions were reviewed Consent on chart for evacuation of hematoma, right above

## 2012-06-27 NOTE — Progress Notes (Addendum)
ANTIBIOTIC CONSULT NOTE - Follow Up  Pharmacy Consult for Ancef Indication: Cellulitis  Allergies  Allergen Reactions  . Sulfa Antibiotics Anaphylaxis and Shortness Of Breath  . Avelox (Moxifloxacin Hcl In Nacl) Diarrhea and Nausea And Vomiting  . Tizanidine     UTI  . Penicillins Hives and Rash    Patient Measurements: Height: 5\' 11"  (180.3 cm) Weight: 165 lb (74.844 kg) IBW/kg (Calculated) : 70.8   Vital Signs: Temp: 98 F (36.7 C) (05/20 0532) Temp src: Oral (05/20 0532) BP: 118/73 mmHg (05/20 0532) Pulse Rate: 61 (05/20 0532) Intake/Output from previous day: 05/19 0701 - 05/20 0700 In: 720 [P.O.:720] Out: 650 [Urine:650] Intake/Output from this shift:    Labs: No results found for this basename: WBC, HGB, PLT, LABCREA, CREATININE,  in the last 72 hours Estimated Creatinine Clearance: 77.3 ml/min (by C-G formula based on Cr of 0.61). No results found for this basename: VANCOTROUGH, Leodis Binet, VANCORANDOM, GENTTROUGH, GENTPEAK, GENTRANDOM, TOBRATROUGH, TOBRAPEAK, TOBRARND, AMIKACINPEAK, AMIKACINTROU, AMIKACIN,  in the last 72 hours   Microbiology: Recent Results (from the past 720 hour(s))  SURGICAL PCR SCREEN     Status: None   Collection Time    05/31/12 12:10 PM      Result Value Range Status   MRSA, PCR NEGATIVE  NEGATIVE Final   Staphylococcus aureus NEGATIVE  NEGATIVE Final   Comment:            The Xpert SA Assay (FDA     approved for NASAL specimens     in patients over 74 years of age),     is one component of     a comprehensive surveillance     program.  Test performance has     been validated by [employer] for patients greater     than or equal to 62 year old.     It is not intended     to diagnose infection nor to     guide or monitor treatment.    Medical History: Past Medical History  Diagnosis Date  . Emphysema   . Atrial fibrillation   . COPD (chronic obstructive pulmonary disease)   . History of atrial fibrillation     S/pP  Ablation  . Asthma   . Arthritis   . Osteoarthritis   . Rheumatoid arthritis   . Anemia   . B12 deficiency anemia   . Stroke     slight stroke in 2009-no deficits  . Neuromuscular disorder     hx of right quadricep rupture  . DJD (degenerative joint disease)   . History of benign bladder tumor   . DVT (deep venous thrombosis)   . Blood transfusion     2009   . Pneumonia     hx of  . GERD (gastroesophageal reflux disease)   . CHF (congestive heart failure)     Assessment: 67 yoF s/p revision of right AKA 4/29, presents with cellulitis at amputation site.  ID has recommended patient start on IV Ancef per pharmacy and switch to Keflex when ready to discharge.   Pt has allergies to sulfa (anaphylaxis), penicillins (hives/rash) but has tolerated Augmentin this admission and appears to be tolerating Ancef.   SCr 0.61, stable.  CrCl ~77 ml/min  WBC WNL  Afebrile  No cultures  Surgery planning for OR today to remove old hematoma to help wound heal faster.  Plan:  Continue Ancef 1 gram IV q 8 hours.  Clance Boll, PharmD, BCPS Pager:  [phone] 06/27/2012 8:51 AM

## 2012-06-27 NOTE — Brief Op Note (Signed)
06/23/2012 - 06/27/2012  5:07 PM  PATIENT:  [name]  67 y.o. female  PRE-OPERATIVE DIAGNOSIS:  hemotoma right aka  POST-OPERATIVE DIAGNOSIS:  hemotoma right AKA stump with serous drainage  PROCEDURE:  Procedure(s): Excisional and non-excisional DEBRIDEMENT of right AKA stump(Right)  SURGEON:  Surgeon(s) and Role:    * [name], MD - Primary  PHYSICIAN ASSISTANT: Lanney Gins, PA-C  ANESTHESIA:   general  EBL:  Total I/O In: 1000 [I.V.:1000] Out: 1000 [Urine:1000]  BLOOD ADMINISTERED:none  DRAINS: (1 medium ) Hemovact drain(s) in the right leg with  Suction Open   LOCAL MEDICATIONS USED:  NONE  SPECIMEN:  Source of Specimen:  right leg hematoma  DISPOSITION OF SPECIMEN:  PATHOLOGY  COUNTS:  YES  TOURNIQUET:    DICTATION: .Other Dictation: Dictation Number [id number]  PLAN OF CARE: Admit to inpatient   PATIENT DISPOSITION:  PACU - hemodynamically stable.   Delay start of Pharmacological VTE agent (>24hrs) due to surgical blood loss or risk of bleeding: no

## 2012-06-27 NOTE — Anesthesia Preprocedure Evaluation (Addendum)
Anesthesia Evaluation  Patient identified by MRN, date of birth, ID band Patient awake    Reviewed: Allergy & Precautions, H&P , NPO status , Patient's Chart, lab work & pertinent test results  Airway Mallampati: II TM Distance: >3 FB Neck ROM: full    Dental no notable dental hx. (+) Dental Advisory Given   Pulmonary asthma , pneumonia -, resolved, COPD COPD inhaler, former smoker,  breath sounds clear to auscultation  + wheezing      Cardiovascular +CHF + dysrhythmias Atrial Fibrillation Rhythm:regular Rate:Normal     Neuro/Psych  Neuromuscular disease CVA, No Residual Symptoms negative psych ROS   GI/Hepatic negative GI ROS, Neg liver ROS, GERD-  ,  Endo/Other  negative endocrine ROS  Renal/GU negative Renal ROS     Musculoskeletal  (+) Arthritis -, Rheumatoid disorders,    Abdominal   Peds  Hematology negative hematology ROS (+)   Anesthesia Other Findings   Reproductive/Obstetrics                          Anesthesia Physical  Anesthesia Plan  ASA: III  Anesthesia Plan: General   Post-op Pain Management:    Induction: Intravenous  Airway Management Planned: LMA  Additional Equipment:   Intra-op Plan:   Post-operative Plan: Extubation in OR  Informed Consent: I have reviewed the patients History and Physical, chart, labs and discussed the procedure including the risks, benefits and alternatives for the proposed anesthesia with the patient or authorized representative who has indicated his/her understanding and acceptance.   Dental advisory given  Plan Discussed with: CRNA  Anesthesia Plan Comments:         Anesthesia Quick Evaluation

## 2012-06-27 NOTE — Preoperative (Signed)
Beta Blockers   Reason not to administer Beta Blockers:Not Applicable 

## 2012-06-28 ENCOUNTER — Encounter (HOSPITAL_COMMUNITY): Payer: Self-pay | Admitting: Orthopedic Surgery

## 2012-06-28 MED ORDER — SODIUM CHLORIDE 0.9 % IJ SOLN
10.0000 mL | INTRAMUSCULAR | Status: DC | PRN
  Administered 2012-06-29 – 2012-06-30 (×2): 10 mL

## 2012-06-28 NOTE — Op Note (Signed)
NAMENYOMIE, EHRLICH          ACCOUNT NO.:  [id number]  MEDICAL RECORD NO.:  [id number]  LOCATION:  1608                         FACILITY:  [facility]  PHYSICIAN:  Madlyn Frankel. Charlann Boxer, M.D.  DATE OF BIRTH:  May 31, 1945  DATE OF PROCEDURE:  06/27/2012 DATE OF DISCHARGE:                              OPERATIVE REPORT   PREOPERATIVE DIAGNOSIS:  Status post right above-the-knee amputation with persistent serous drainage following recent excision of painful neuromas, worrisome for underlying hematoma.  POSTOPERATIVE DIAGNOSIS:  Right above-the-knee amputation hematoma with underlying seroma and serous drainage.  PROCEDURES: 1. Excisional debridement of the right leg wound including skin,     subcutaneous tissue, and underlying nonviable tissue that was     present predominantly hematoma as well as old sutures.  This     involved an area of about 5 inches long including skin and     subcutaneous utilizing the scalpel and the Bovie cautery. 2. Nonexcisional debridement of the right leg wound right above-the-     knee amputation stump utilizing 6 liters of normal saline solution     pulse lavage.  SURGEON:  Madlyn Frankel. Charlann Boxer, M.D.  ASSISTANT:  Lanney Gins, PA-C.  Note that, Mr. Carmon Sails was present for the entirety of the case for preoperative management of the extremity, primary wound closure and general facilitation of the case.  ANESTHESIA:  General.  SPECIMENS:  Upon entry into the leg wound, I did take the cultures of seroma and surrounding tissue for stat Gram-stain culture analysis.  DRAINS:  One medium Hemovac.  TOURNIQUET:  Not utilized.  INDICATIONS FOR PROCEDURE:  Ms. Leppanen is a 67 year old female with history of right above-the-knee amputation.  She has been to the operating room two different times subsequent to that for debridement of nerves and most recent was about 2-3 weeks ago.  She presented to the office in followup with some redness on the medial side of her  distal thigh wound area as well as some erythematous changes in this area.  She did have some wound drainage in the central portion of her incision. Based on the persistence of this recent followup, I admitted her to the hospital at this point.  She had been on IV antibiotics per Infectious Disease for 4 days with persistent drainage.  Based on this, I discussed with her that I think it is best to do in there and evacuate whatever was present to allow her skin the best chance of healing.  Risks of recurrence of infection and complicating features of the wound were discussed and reviewed.  Consent was obtained for benefit of wound management.  PROCEDURE IN DETAIL:  The patient was brought to the operative theater. Once adequate anesthesia, preoperative antibiotics, Ancef which was also had been therapeutic, but she also received a separate wound, 1 g of that, in the operating room.  She was positioned supine.  The right perineal area was prepped and draped in sterile fashion after placing nonsterile thigh tourniquet.  The right lower extremity was then prepped and draped in sterile fashion.  A time-out was performed identifying the patient, planned procedure, and extremity.  Basically used the medial half of her incision from the central area where she  had some drainage to the medial aspect.  A sharp dissection was carried down through the skin, encountering this retained small hematoma and the seromatous- appearing fluid.  I did take cultures on the left.  I evacuated all the hematoma and then I sharply debrided the skin edges, subcutaneous tissue, any nonviable-appearing tissue as well as removed sutures in this area that were placed to the previous procedure.  Following this excisional debridement, I then performed the nonexcisional debridement, irrigating the wound out with 6 liters of normal saline solution with pulse lavage.  Following this, I selected to place a medium  Hemovac drain deep.  It was placed deep and extended out at the lateral side of the wound, then chose to just reapproximate the skin edges using 2-0 nylon at this point as opposed to staples.  The skin was then cleaned, dried and dressed sterilely.  There was 0 tension on the skin upon closure in this fashion with inverted edges hopefully to facilitate healing at this point.  The wound was then cleaned, dried and dressed sterilely using Xeroform and a bulky wrap.  She was then brought to the recovery room in stable condition tolerating the procedure well.  She will remain in the hospital for 1-2 more days as well as isolate out antibiotics per Infectious Disease, pending any cultures received at this point.     Madlyn Frankel Charlann Boxer, M.D.     MDO/MEDQ  D:  06/27/2012  T:  06/28/2012  Job:  [id number]

## 2012-06-28 NOTE — Progress Notes (Signed)
06/28/2012 Colleen Can BSN RN CCM [phone] Current plans are for pt to go to SNF rehab. CM will sign off. Will follow as needed.

## 2012-06-28 NOTE — Progress Notes (Signed)
   Subjective: 1 Day Post-Op Procedure(s) (LRB): IRRIGATION AND DEBRIDEMENT hematoma right AKA (Right)   Patient reports pain as mild, pain well controlled. No events throughout the night.   Objective:   VITALS:   Filed Vitals:   06/28/12 0600  BP: 145/69  Pulse: 73  Temp: 97.9 F (36.6 C)  Resp: 18    Neurovascular intact Incision: dressing C/D/I No cellulitis present Compartment soft  LABS No new labs  Assessment/Plan: 1 Day Post-Op Procedure(s) (LRB): IRRIGATION AND DEBRIDEMENT hematoma right AKA (Right) PICC line placement today ID consult Advance diet D/C IV fluids Discharge to SNF eventually, when ready      Anastasio Auerbach. Chelle Cayton   PAC  06/28/2012, 9:23 AM

## 2012-06-28 NOTE — Progress Notes (Signed)
    [facility]  Date of Admission:  06/23/2012  Antibiotics: Cefazolin day 4  Subjective: OR yesterday, gram stain with rare WBCs  Objective: Temp:  [97.3 F (36.3 C)-98.1 F (36.7 C)] 98 F (36.7 C) (05/21 1058) Pulse Rate:  [62-78] 62 (05/21 1058) Resp:  [14-20] 18 (05/21 1058) BP: (96-145)/(54-69) 114/56 mmHg (05/21 1058) SpO2:  [91 %-100 %] 98 % (05/21 1058)  General: Awake, in chair, nad Skin: wrapped   Lab Results Lab Results  Component Value Date   WBC 5.8 06/23/2012   HGB 11.2* 06/23/2012   HCT 32.3* 06/23/2012   MCV 104.9* 06/23/2012   PLT 317 06/23/2012    Lab Results  Component Value Date   CREATININE 0.61 06/23/2012   BUN 11 06/23/2012   NA 139 06/23/2012   K 4.0 06/23/2012   CL 102 06/23/2012   CO2 27 06/23/2012    Lab Results  Component Value Date   ALT 15 12/19/2010   AST 35 12/19/2010   ALKPHOS 79 12/19/2010   BILITOT 0.3 12/19/2010      Microbiology: Recent Results (from the past 240 hour(s))  WOUND CULTURE     Status: None   Collection Time    06/27/12  4:50 PM      Result Value Range Status   Specimen Description LEG   Final   Special Requests NONE   Final   Gram Stain     Final   Value: RARE WBC PRESENT, PREDOMINANTLY PMN     NO SQUAMOUS EPITHELIAL CELLS SEEN     NO ORGANISMS SEEN   Culture PENDING   Incomplete   Report Status PENDING   Incomplete  ANAEROBIC CULTURE     Status: None   Collection Time    06/27/12  4:50 PM      Result Value Range Status   Specimen Description LEG   Final   Special Requests NONE   Final   Gram Stain     Final   Value: RARE WBC PRESENT, PREDOMINANTLY PMN     NO WBC SEEN     NO SQUAMOUS EPITHELIAL CELLS SEEN   Culture PENDING   Incomplete   Report Status PENDING   Incomplete    Studies/Results: No results found.  Assessment/Plan: 1)  Cellulitis - on cefazolin.  No abscess noted in OR.  On antibiotics for cellulitis.   -could switch to po Keflex 500 mg qid at discharge,  duration would be until cellulitis resolved (5-7 days probably)   Staci Righter, MD [facility] [phone] pager   06/28/2012, 11:43 AM

## 2012-06-28 NOTE — Progress Notes (Signed)
Peripherally Inserted Central Catheter/Midline Placement  The IV Nurse has discussed with the patient and/or persons authorized to consent for the patient, the purpose of this procedure and the potential benefits and risks involved with this procedure.  The benefits include less needle sticks, lab draws from the catheter and patient may be discharged home with the catheter.  Risks include, but not limited to, infection, bleeding, blood clot (thrombus formation), and puncture of an artery; nerve damage and irregular heat beat.  Alternatives to this procedure were also discussed.  PICC/Midline Placement Documentation        [name] 06/28/2012, 1:46 PM

## 2012-06-29 LAB — BASIC METABOLIC PANEL
BUN: 12 mg/dL (ref 6–23)
Calcium: 9.3 mg/dL (ref 8.4–10.5)
GFR calc non Af Amer: 88 mL/min — ABNORMAL LOW (ref >90)
Glucose, Bld: 105 mg/dL — ABNORMAL HIGH (ref 70–99)

## 2012-06-29 LAB — CBC
HCT: 29 % — ABNORMAL LOW (ref 36.0–46.0)
Hemoglobin: 9.7 g/dL — ABNORMAL LOW (ref 12.0–15.0)
MCH: 35.7 pg — ABNORMAL HIGH (ref 26.0–34.0)
MCHC: 33.4 g/dL (ref 30.0–36.0)

## 2012-06-29 NOTE — Progress Notes (Signed)
   Subjective: 2 Days Post-Op Procedure(s) (LRB): IRRIGATION AND DEBRIDEMENT hematoma right AKA (Right)   Patient reports pain as mild, pain well controlled. No events throughout the night. Feels that she is doing well.  Objective:   VITALS:   Filed Vitals:   06/29/12 0516  BP: 112/65  Pulse: 63  Temp: 98.1 F (36.7 C)  Resp: 16    Neurovascular intact Incision: dressing C/D/I No cellulitis present Compartment soft  LABS  Recent Labs  06/29/12 0515  HGB 9.7*  HCT 29.0*  WBC 6.0  PLT 228     Recent Labs  06/29/12 0515  NA 135  K 4.1  BUN 12  CREATININE 0.72  GLUCOSE 105*     Assessment/Plan: 2 Days Post-Op Procedure(s) (LRB): IRRIGATION AND DEBRIDEMENT hematoma right AKA (Right) HV drain wasn't pulling anymore, and was d/c'ed today Dressing changed to 4x4 guaze and ACE bandage Discharge to SNF eventually when ready, probably tomorrow. PICC was placed yesterday and she is tolerating it well.  Discussed with Dr. Luciana Axe regarding course of post discharge treatment After discussion with Dr. Charlann Boxer, he would like to use the IV Ancef for about 2 weeks and then switch over to the Keflex for a total of 4 weeks of treatment Appreciate Dr. Luciana Axe seeing and helping with this patient's treatments.        Anastasio Auerbach Peony Barner   PAC  06/29/2012, 12:33 PM

## 2012-06-29 NOTE — Progress Notes (Signed)
    [facility]  Date of Admission:  06/23/2012  Antibiotics: Cefazolin day 4  Subjective: OR yesterday, gram stain with rare WBCs  Objective: Temp:  [97.8 F (36.6 C)-98.4 F (36.9 C)] 98.1 F (36.7 C) (05/22 0516) Pulse Rate:  [63-68] 63 (05/22 0516) Resp:  [16-18] 16 (05/22 0516) BP: (90-112)/(51-65) 112/65 mmHg (05/22 0516) SpO2:  [92 %-98 %] 96 % (05/22 0516)  General: Awake, in chair, nad Skin: wrapped   Lab Results Lab Results  Component Value Date   WBC 6.0 06/29/2012   HGB 9.7* 06/29/2012   HCT 29.0* 06/29/2012   MCV 106.6* 06/29/2012   PLT 228 06/29/2012    Lab Results  Component Value Date   CREATININE 0.72 06/29/2012   BUN 12 06/29/2012   NA 135 06/29/2012   K 4.1 06/29/2012   CL 99 06/29/2012   CO2 29 06/29/2012    Lab Results  Component Value Date   ALT 15 12/19/2010   AST 35 12/19/2010   ALKPHOS 79 12/19/2010   BILITOT 0.3 12/19/2010      Microbiology: Recent Results (from the past 240 hour(s))  WOUND CULTURE     Status: None   Collection Time    06/27/12  4:50 PM      Result Value Range Status   Specimen Description LEG   Final   Special Requests NONE   Final   Gram Stain     Final   Value: RARE WBC PRESENT, PREDOMINANTLY PMN     NO SQUAMOUS EPITHELIAL CELLS SEEN     NO ORGANISMS SEEN   Culture NO GROWTH 1 DAY   Final   Report Status PENDING   Incomplete  ANAEROBIC CULTURE     Status: None   Collection Time    06/27/12  4:50 PM      Result Value Range Status   Specimen Description LEG   Final   Special Requests NONE   Final   Gram Stain     Final   Value: RARE WBC PRESENT, PREDOMINANTLY PMN     NO WBC SEEN     NO SQUAMOUS EPITHELIAL CELLS SEEN   Culture     Final   Value: NO ANAEROBES ISOLATED; CULTURE IN PROGRESS FOR 5 DAYS   Report Status PENDING   Incomplete    Studies/Results: No results found.  Assessment/Plan: 1)  Cellulitis - on Ancef.  After discussion with Lanney Gins, will continue with IV  Ancef and prolong course due to history of recurrent infections.   -she should get weekly CBC with diff, CMP and results to [facility] -picc in place -we will arrange follow up with her in about 2 weeks   Staci Righter, MD [facility] [phone] pager   06/29/2012, 11:34 AM

## 2012-06-30 LAB — CBC
Hemoglobin: 9.7 g/dL — ABNORMAL LOW (ref 12.0–15.0)
MCH: 35.8 pg — ABNORMAL HIGH (ref 26.0–34.0)
MCHC: 33.8 g/dL (ref 30.0–36.0)
MCV: 105.9 fL — ABNORMAL HIGH (ref 78.0–100.0)

## 2012-06-30 LAB — BASIC METABOLIC PANEL
Calcium: 8.9 mg/dL (ref 8.4–10.5)
Creatinine, Ser: 0.74 mg/dL (ref 0.50–1.10)
GFR calc non Af Amer: 87 mL/min — ABNORMAL LOW (ref >90)
Glucose, Bld: 86 mg/dL (ref 70–99)
Sodium: 139 mEq/L (ref 135–145)

## 2012-06-30 LAB — WOUND CULTURE: Culture: NO GROWTH

## 2012-06-30 MED ORDER — HYDROCODONE-ACETAMINOPHEN 7.5-325 MG PO TABS: 1.0000 | ORAL_TABLET | ORAL | Status: DC | PRN

## 2012-06-30 MED ORDER — CEFAZOLIN SODIUM 1-5 GM-% IV SOLN: 1.0000 g | Freq: Three times a day (TID) | INTRAVENOUS | Status: DC

## 2012-06-30 MED ORDER — HEPARIN SOD (PORK) LOCK FLUSH 100 UNIT/ML IV SOLN
250.0000 [IU] | INTRAVENOUS | Status: AC | PRN
  Administered 2012-06-30: 250 [IU]

## 2012-06-30 MED ORDER — METHOCARBAMOL 500 MG PO TABS: 500.0000 mg | ORAL_TABLET | Freq: Four times a day (QID) | ORAL | Status: DC | PRN

## 2012-06-30 MED ORDER — NYSTATIN 100000 UNIT/GM EX POWD: 1.0000 | Freq: Two times a day (BID) | CUTANEOUS | Status: DC

## 2012-06-30 MED ORDER — CEPHALEXIN 500 MG PO CAPS: 500.0000 mg | ORAL_CAPSULE | Freq: Four times a day (QID) | ORAL | Status: DC

## 2012-06-30 MED ORDER — POLYETHYLENE GLYCOL 3350 17 G PO PACK: 17.0000 g | PACK | Freq: Every day | ORAL | Status: DC | PRN

## 2012-06-30 NOTE — Progress Notes (Signed)
ANTIBIOTIC CONSULT NOTE - Follow Up  Pharmacy Consult for Ancef Indication: Cellulitis  Allergies  Allergen Reactions  . Sulfa Antibiotics Anaphylaxis and Shortness Of Breath  . Avelox (Moxifloxacin Hcl In Nacl) Diarrhea and Nausea And Vomiting  . Tizanidine     UTI  . Penicillins Hives and Rash    Patient Measurements: Height: 5\' 11"  (180.3 cm) Weight: 165 lb (74.844 kg) IBW/kg (Calculated) : 70.8   Vital Signs: Temp: 97.8 F (36.6 C) (05/23 0459) Temp src: Oral (05/23 0459) BP: 107/65 mmHg (05/23 0459) Pulse Rate: 50 (05/23 0459) Intake/Output from previous day: 05/22 0701 - 05/23 0700 In: 600 [P.O.:600] Out: 2143 [Urine:2125; Drains:18] Intake/Output from this shift: Total I/O In: 240 [P.O.:240] Out: -   Labs:  Recent Labs  06/29/12 0515 06/30/12 0525  WBC 6.0 4.9  HGB 9.7* 9.7*  PLT 228 230  CREATININE 0.72 0.74   Estimated Creatinine Clearance: 77.3 ml/min (by C-G formula based on Cr of 0.74). No results found for this basename: VANCOTROUGH, Leodis Binet, VANCORANDOM, GENTTROUGH, GENTPEAK, GENTRANDOM, TOBRATROUGH, TOBRAPEAK, TOBRARND, AMIKACINPEAK, AMIKACINTROU, AMIKACIN,  in the last 72 hours   Microbiology: Recent Results (from the past 720 hour(s))  SURGICAL PCR SCREEN     Status: None   Collection Time    05/31/12 12:10 PM      Result Value Range Status   MRSA, PCR NEGATIVE  NEGATIVE Final   Staphylococcus aureus NEGATIVE  NEGATIVE Final   Comment:            The Xpert SA Assay (FDA     approved for NASAL specimens     in patients over 28 years of age),     is one component of     a comprehensive surveillance     program.  Test performance has     been validated by [employer] for patients greater     than or equal to 68 year old.     It is not intended     to diagnose infection nor to     guide or monitor treatment.  WOUND CULTURE     Status: None   Collection Time    06/27/12  4:50 PM      Result Value Range Status   Specimen  Description LEG   Final   Special Requests NONE   Final   Gram Stain     Final   Value: RARE WBC PRESENT, PREDOMINANTLY PMN     NO SQUAMOUS EPITHELIAL CELLS SEEN     NO ORGANISMS SEEN   Culture NO GROWTH 1 DAY   Final   Report Status PENDING   Incomplete  ANAEROBIC CULTURE     Status: None   Collection Time    06/27/12  4:50 PM      Result Value Range Status   Specimen Description LEG   Final   Special Requests NONE   Final   Gram Stain     Final   Value: RARE WBC PRESENT, PREDOMINANTLY PMN     NO WBC SEEN     NO SQUAMOUS EPITHELIAL CELLS SEEN   Culture     Final   Value: NO ANAEROBES ISOLATED; CULTURE IN PROGRESS FOR 5 DAYS   Report Status PENDING   Incomplete    Medical History: Past Medical History  Diagnosis Date  . Emphysema   . Atrial fibrillation   . COPD (chronic obstructive pulmonary disease)   . History of atrial fibrillation  S/pP Ablation  . Asthma   . Arthritis   . Osteoarthritis   . Rheumatoid arthritis   . Anemia   . B12 deficiency anemia   . Stroke     slight stroke in 2009-no deficits  . Neuromuscular disorder     hx of right quadricep rupture  . DJD (degenerative joint disease)   . History of benign bladder tumor   . DVT (deep venous thrombosis)   . Blood transfusion     2009   . Pneumonia     hx of  . GERD (gastroesophageal reflux disease)   . CHF (congestive heart failure)     Assessment: 67 yoF s/p revision of right AKA 4/29, presented with cellulitis at amputation site, now s/p I/D hematoma.  Pt has allergies to sulfa (anaphylaxis), penicillins (hives/rash) but has tolerated Augmentin this admission and appears to be tolerating Ancef. Currently on D#7 Ancef.  Plan per ID/Surgery is to continue IV Ancef x 2 weeks d/t hx recurrent infections, then change to oral cephalexin x 2 weeks.  PICC placed 5/22.  Probable d/c tomorrow.    SCr 0.61, stable.  CrCl ~77 ml/min  WBC WNL  Afebrile  No cultures  Plan:  Continue Ancef 1 gram IV q 8  hours.  Haynes Hoehn, PharmD 06/30/2012 7:58 AM  Pager: [phone]

## 2012-06-30 NOTE — Progress Notes (Signed)
PICC line capped off, flushed with 10cc NS, GBR, Heparin 250 units instilled. Consuello Masse

## 2012-06-30 NOTE — Progress Notes (Signed)
   Subjective: 3 Days Post-Op Procedure(s) (LRB): IRRIGATION AND DEBRIDEMENT hematoma right AKA (Right)   Patient reports pain as mild, pain well controlled in the right stump. Complaining of some neck pain, which was hurting prior to surgery. No events throughout the night. Ready to be discharged to SNF.  Objective:   VITALS:   Filed Vitals:   06/30/12 0459  BP: 107/65  Pulse: 50  Temp: 97.8 F (36.6 C)  Resp: 16    Neurovascular intact  Incision: dressing C/D/I  No cellulitis present  Compartment soft   LABS  Recent Labs  06/29/12 0515 06/30/12 0525  HGB 9.7* 9.7*  HCT 29.0* 28.7*  WBC 6.0 4.9  PLT 228 230     Recent Labs  06/29/12 0515 06/30/12 0525  NA 135 139  K 4.1 4.4  BUN 12 11  CREATININE 0.72 0.74  GLUCOSE 105* 86     Assessment/Plan: 3 Days Post-Op Procedure(s) (LRB): IRRIGATION AND DEBRIDEMENT hematoma right AKA (Right)  Discharge to SNF eventually when ready, probably tomorrow.  PICC was placed yesterday and she is tolerating it well.  Discussed with Dr. Luciana Axe regarding course of post discharge treatment  After discussion with Dr. Charlann Boxer, he would like to use the IV Ancef for about 2 weeks and then switch over to the Keflex for a total of 4 weeks of treatment  Appreciate Dr. Luciana Axe seeing and helping with this patient's treatments.     Anastasio Auerbach Radin Raptis   PAC  06/30/2012, 8:14 AM

## 2012-06-30 NOTE — Discharge Summary (Signed)
Physician Discharge Summary  Patient ID: [name] MRN: [id number] DOB/AGE: 1946-01-14 67 y.o.  Admit date: 06/23/2012 Discharge date:  06/30/2012  Procedures:  Procedure(s) (LRB): IRRIGATION AND DEBRIDEMENT hematoma right AKA (Right)  Attending Physician:  Dr. Durene Romans   Admission Diagnoses:   Cellulitis with drainage of the distal right leg stump  Discharge Diagnoses:  Active Problems:   S/P right AKA scar debridement  Past Medical History  Diagnosis Date  . Emphysema   . Atrial fibrillation   . COPD (chronic obstructive pulmonary disease)   . History of atrial fibrillation     S/pP Ablation  . Asthma   . Arthritis   . Osteoarthritis   . Rheumatoid arthritis   . Anemia   . B12 deficiency anemia   . Stroke     slight stroke in 2009-no deficits  . Neuromuscular disorder     hx of right quadricep rupture  . DJD (degenerative joint disease)   . History of benign bladder tumor   . DVT (deep venous thrombosis)   . Blood transfusion     2009   . Pneumonia     hx of  . GERD (gastroesophageal reflux disease)   . CHF (congestive heart failure)     HPI:   Pt is a 67 y.o. female complaining of redness and drainage of the distal right stump. 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 She had been doing well, but home health nurse started to notice redness over the distal portion of the stump and some drainage from the otherwise healing incision. Once seen in the clinic various options were discussed. She will be admitted to the hospital for IV antibiotics and possible further treatment modalities. Patient understand the risks, benefits and expectations and wishes to proceed.  PCP: Marylen Ponto, MD   Discharged Condition: good  Hospital Course:  Patient was admitted to the hospital on 06/23/2012 do to some cellulitis of the distal portion of the right stump.  She started receiving IV antibiotic right away and her stay was  basically uneventful for a couple days.  Once she had received a couple days of antibiotics and her wound kept draining it was decided to I&D the right stump.  Patient underwent I&D of the right stump on 06/27/2012. Patient tolerated the procedure well and brought to the recovery room in good condition and subsequently to the floor.  POD #1 BP: 145/69 ; Pulse: 73 ; Temp: 97.9 F (36.6 C) ; Resp: 18 Patient reports pain as mild, pain well controlled. No events throughout the night. Neurovascular intact, incision: dressing C/D/I, no cellulitis present and compartment soft.   POD #2  BP: 112/65 ; Pulse: 63 ; Temp: 98.1 F (36.7 C) ; Resp: 16  Patient reports pain as mild, pain well controlled. No events throughout the night. Feels that she is doing well. HV drain wasn't pulling anymore, and was d/c'ed today.  Dressing changed to 4x4 guaze and ACE bandage. PICC was placed yesterday and she is tolerating it well. Discussed with Dr. Luciana Axe regarding course of post discharge treatment. Neurovascular intact, incision: dressing C/D/I, no cellulitis present and compartment soft.   LABS  Basename  06/29/12    0515   HGB  9.7  HCT  29.0   POD #3  BP: 107/65 ; Pulse: 50 ; Temp: 97.8 F (36.6 C) ; Resp: 16 Patient reports pain as mild, pain well controlled in the right stump. Complaining of some  neck pain, which was hurting prior to surgery. No events throughout the night. Ready to be discharged to SNF. Neurovascular intact, incision: dressing C/D/I, no cellulitis present and compartment soft.   LABS  Basename  06/30/12    0525  HGB  9.7  HCT  28.7    Discharge Exam: General appearance: alert, cooperative and no distress Extremities: Homans sign is negative, no sign of DVT, no edema, redness or tenderness in the calves or thighs and no ulcers, gangrene or trophic changes  Disposition: SNF with follow up in 2 weeks   Follow-up Information   Follow up with [name], MD. Schedule an  appointment as soon as possible for a visit in 2 weeks.   Contact information:   [address] [place] [address] [phone]       Discharge Orders   Future Appointments Provider Department Dept Phone   07/17/2012 3:00 PM Gardiner Barefoot, MD [facility] [phone]   Future Orders Complete By Expires     Call MD / Call 911  As directed     Comments:      If you experience chest pain or shortness of breath, CALL 911 and be transported to the hospital emergency room.  If you develope a fever above 101 F, pus (white drainage) or increased drainage or redness at the wound, or calf pain, call your surgeon's office.    Constipation Prevention  As directed     Comments:      Drink plenty of fluids.  Prune juice may be helpful.  You may use a stool softener, such as Colace (over the counter) 100 mg twice a day.  Use MiraLax (over the counter) for constipation as needed.    Diet - low sodium heart healthy  As directed     Discharge instructions  As directed     Comments:      Daily dressing changes with 4x4 gauze and tape. Keep the area dry and clean until follow up. Follow up in 2 weeks at [facility]. Call with any questions or concerns.    Increase activity slowly as tolerated  As directed     Weight bearing as tolerated  As directed         Medication List    TAKE these medications       amiodarone 200 MG tablet  Commonly known as:  PACERONE  Take 200 mg by mouth every evening.     ascorbic acid 1000 MG tablet  Commonly known as:  VITAMIN C  Take 1,000 mg by mouth at bedtime.     BREO ELLIPTA 100-25 MCG/INH Aepb  Generic drug:  Fluticasone Furoate-Vilanterol  Inhale 1 puff into the lungs every morning.     BREO ELLIPTA 100-25 MCG/INH Aepb  Generic drug:  Fluticasone Furoate-Vilanterol  Inhale 1 puff into the lungs at bedtime.     ceFAZolin 1-5 GM-%  Commonly known as:  ANCEF  Inject 50 mLs (1 g total) into  the vein every 8 (eight) hours.     celecoxib 200 MG capsule  Commonly known as:  CELEBREX  Take 200 mg by mouth every evening.     cephALEXin 500 MG capsule  Commonly known as:  KEFLEX  Take 1 capsule (500 mg total) by mouth 4 (four) times daily.     CRANBERRY PO  Take 2 tablets by mouth at bedtime.     dabigatran 150 MG Caps  Commonly known as:  PRADAXA  Take 150 mg by mouth every 12 (twelve) hours.     dimenhyDRINATE 50 MG tablet  Commonly known as:  DRAMAMINE  Take 50 mg by mouth at bedtime as needed (sleep).     diphenhydrAMINE 25 mg capsule  Commonly known as:  BENADRYL  Take 50 mg by mouth at bedtime as needed for itching or sleep.     doxycycline 100 MG DR capsule  Commonly known as:  DORYX  Take 100 mg by mouth 2 (two) times daily. Started 5/9 X 14 days     HYDROcodone-acetaminophen 7.5-325 MG per tablet  Commonly known as:  NORCO  Take 1-2 tablets by mouth every 4 (four) hours as needed for pain.     methocarbamol 500 MG tablet  Commonly known as:  ROBAXIN  Take 1 tablet (500 mg total) by mouth every 6 (six) hours as needed (muscle spasms).     nystatin 100000 UNIT/GM Powd  Apply 1 Bottle topically 2 (two) times daily.     omeprazole 20 MG capsule  Commonly known as:  PRILOSEC  Take 20 mg by mouth at bedtime.     polyethylene glycol packet  Commonly known as:  MIRALAX / GLYCOLAX  Take 17 g by mouth daily as needed.     pyridoxine 100 MG tablet  Commonly known as:  B-6  Take 100 mg by mouth at bedtime.     traZODone 50 MG tablet  Commonly known as:  DESYREL  Take 50 mg by mouth at bedtime.         Signed: Anastasio Auerbach. Buren Havey   PAC  06/30/2012, 8:34 AM

## 2012-07-01 NOTE — Progress Notes (Signed)
Clinical Social Work Department CLINICAL SOCIAL WORK PLACEMENT NOTE 07/01/2012  Patient:  LERIN, JECH  Account Number:  [id number] Admit date:  06/23/2012  Clinical Social Worker:  Cori Razor, LCSW  Date/time:  06/26/2012 02:21 PM  Clinical Social Work is seeking post-discharge placement for this patient at the following level of care:   SKILLED NURSING   (*CSW will update this form in Epic as items are completed)     Patient/family provided with [facility] Department of Clinical Social Work's list of facilities offering this level of care within the geographic area requested by the patient (or if unable, by the patient's family).  06/26/2012  Patient/family informed of their freedom to choose among providers that offer the needed level of care, that participate in Medicare, Medicaid or managed care program needed by the patient, have an available bed and are willing to accept the patient.    Patient/family informed of [facility]' ownership interest in [facility], as well as of the fact that they are under no obligation to receive care at this facility.  PASARR submitted to EDS on  PASARR number received from [employer]DS on 06/05/2007  FL2 transmitted to all facilities in geographic area requested by pt/family on  06/26/2012 FL2 transmitted to all facilities within larger geographic area on   Patient informed that his/her managed care company has contracts with or will negotiate with  certain facilities, including the following:     Patient/family informed of bed offers received:  06/26/2012 Patient chooses bed at [facility] [place] Physician recommends and patient chooses bed at    Patient to be transferred to [facility], [place] on  06/30/2012 Patient to be transferred to facility by [facility]  The following physician request were entered in Epic:   Additional Comments:  Cori Razor LCSW [phone]

## 2012-07-02 LAB — ANAEROBIC CULTURE

## 2012-07-04 ENCOUNTER — Non-Acute Institutional Stay (SKILLED_NURSING_FACILITY): Payer: Medicare Other | Admitting: Internal Medicine

## 2012-07-04 ENCOUNTER — Encounter: Payer: Self-pay | Admitting: Internal Medicine

## 2012-07-04 DIAGNOSIS — D500 Iron deficiency anemia secondary to blood loss (chronic): Secondary | ICD-10-CM

## 2012-07-04 DIAGNOSIS — K219 Gastro-esophageal reflux disease without esophagitis: Secondary | ICD-10-CM

## 2012-07-04 DIAGNOSIS — Z720 Tobacco use: Secondary | ICD-10-CM | POA: Insufficient documentation

## 2012-07-04 DIAGNOSIS — L02419 Cutaneous abscess of limb, unspecified: Secondary | ICD-10-CM

## 2012-07-04 DIAGNOSIS — J449 Chronic obstructive pulmonary disease, unspecified: Secondary | ICD-10-CM

## 2012-07-04 DIAGNOSIS — G4700 Insomnia, unspecified: Secondary | ICD-10-CM

## 2012-07-04 DIAGNOSIS — I959 Hypotension, unspecified: Secondary | ICD-10-CM

## 2012-07-04 DIAGNOSIS — S88919A Complete traumatic amputation of unspecified lower leg, level unspecified, initial encounter: Secondary | ICD-10-CM

## 2012-07-04 DIAGNOSIS — F17200 Nicotine dependence, unspecified, uncomplicated: Secondary | ICD-10-CM

## 2012-07-04 DIAGNOSIS — L03119 Cellulitis of unspecified part of limb: Secondary | ICD-10-CM

## 2012-07-04 DIAGNOSIS — I509 Heart failure, unspecified: Secondary | ICD-10-CM

## 2012-07-04 DIAGNOSIS — I4891 Unspecified atrial fibrillation: Secondary | ICD-10-CM

## 2012-07-04 DIAGNOSIS — J4489 Other specified chronic obstructive pulmonary disease: Secondary | ICD-10-CM

## 2012-07-04 MED ORDER — CEPHALEXIN 500 MG PO TABS: 500.0000 mg | ORAL_TABLET | Freq: Four times a day (QID) | ORAL | Status: DC

## 2012-07-04 MED ORDER — CEFAZOLIN SODIUM 1-5 GM-% IV SOLN: 1.0000 g | Freq: Three times a day (TID) | INTRAVENOUS | Status: DC

## 2012-07-04 NOTE — Progress Notes (Signed)
Date: 07/04/2012  MRN:  [id number] Name:  [name] Sex:  female Age:  67 y.o. DOB:[birth year]   PSC #:                       Facility/Room:  [facility] Level Of Care:  SNF Provider: Carita Pian, CMD  Emergency Contacts: Contact Information   Name Relation Home Work Mobile   Cyr,Sue Sister [phone]  [phone]      Code Status:  Full code  Allergies: Allergies  Allergen Reactions  . Sulfa Antibiotics Anaphylaxis and Shortness Of Breath  . Avelox (Moxifloxacin Hcl In Nacl) Diarrhea and Nausea And Vomiting  . Tizanidine     UTI  . Penicillins Hives and Rash     Chief Complaint  Patient presents with  . Hospitalization Follow-up    new admission for STR s/p hospitalization for cellulitis of amputation site     HPI:  67 yo female with very complicated h/o infections, COPD and ongoing tobacco abuse, afib, stroke, CHF was admitted here for STR s/p hospitaization with cellulitis of her amputation site. She was treated with IV ancef, underwent debridement of a hematoma at her amputation site, and is now on both po keflex and IV ancef.  She is eager for discharge home already today (she has been here since 5/23).  She has been through many surgeries before and [facility]  Past Medical History  Diagnosis Date  . Emphysema   . Atrial fibrillation   . COPD (chronic obstructive pulmonary disease)   . History of atrial fibrillation     S/pP Ablation  . Asthma   . Arthritis   . Osteoarthritis   . Rheumatoid arthritis   . Anemia   . B12 deficiency anemia   . Stroke     slight stroke in 2009-no deficits  . Neuromuscular disorder     hx of right quadricep rupture  . DJD (degenerative joint disease)   . History of benign bladder tumor   . DVT (deep venous thrombosis)   . Blood transfusion     2009   . Pneumonia     hx of  . GERD (gastroesophageal reflux disease)   . CHF (congestive heart failure)      Past Surgical History  Procedure Laterality Date  . Replacement total knee    . Cardiac electrophysiology mapping and ablation    . Cholecystectomy  1996  . Cataract extraction, bilateral    . Hematoma evacuation  2009    From right groin  . Joint replacement      Left-1996, Right-1995  . Multiple tooth extractions  06/30/2010  . Trigger finger release  2011  . Amputation  12/17/2010    Procedure: AMPUTATION ABOVE KNEE;  Surgeon: [name];  Location: [facility];  Service: Orthopedics;  Laterality: Right;  . Application of wound vac  12/17/2010    Procedure: APPLICATION OF WOUND VAC;  Surgeon: [name];  Location: [facility];  Service: Orthopedics;  Laterality: Right;  wound Vac applied @ 2045 By Luna Fuse  . Amputation  04/27/2011    Procedure: AMPUTATION ABOVE KNEE;  Surgeon: [name], MD;  Location: [facility];  Service: Orthopedics;  Laterality: Right;  Revision Right above knee amputation.  . Cardiac catheterization    . Cardioversion      x5 times  . Amputation  02/28/2012    Procedure: AMPUTATION BELOW KNEE;  Surgeon: [name], MD;  Location: [facility];  Service: Orthopedics;  Laterality: Right;  SCAR REVISION  . Tenosynovectomy      left long and ring fingers on 04/19/12  . Amputation Right 06/06/2012    Procedure: REVISION ABOVE RIGHT KNEE AMPUTATION, STUMP ;  Surgeon: [name], MD;  Location: [facility];  Service: Orthopedics;  Laterality: Right;  . I&d extremity Right 06/27/2012    Procedure: IRRIGATION AND DEBRIDEMENT hematoma right AKA;  Surgeon: [name], MD;  Location: [facility];  Service: Orthopedics;  Laterality: Right;     Procedures: Irrigation and debridement of right AKA hematoma with cellulitis 06/27/12--Dr. Durene Romans  Consultants: Dr. Molli Hazard Olin--orthopedics PCP--Dr. Marylen Ponto  Current Outpatient Prescriptions  Medication Sig Dispense Refill  . amiodarone (PACERONE) 200 MG tablet Take 200 mg by mouth every evening.       Marland Kitchen ascorbic acid  (VITAMIN C) 1000 MG tablet Take 1,000 mg by mouth at bedtime.      Marland Kitchen ceFAZolin (ANCEF) 1-5 GM-% Inject 50 mLs (1 g total) into the vein every 8 (eight) hours.  2100 mL  0  . celecoxib (CELEBREX) 200 MG capsule Take 200 mg by mouth every evening.       . cephALEXin (KEFLEX) 500 MG capsule Take 1 capsule (500 mg total) by mouth 4 (four) times daily.  56 capsule  0  . CRANBERRY PO Take 2 tablets by mouth at bedtime.      . dabigatran (PRADAXA) 150 MG CAPS Take 150 mg by mouth every 12 (twelve) hours.      Marland Kitchen dimenhyDRINATE (DRAMAMINE) 50 MG tablet Take 50 mg by mouth at bedtime as needed (sleep).      . diphenhydrAMINE (BENADRYL) 25 mg capsule Take 50 mg by mouth at bedtime as needed for itching or sleep.      Marland Kitchen doxycycline (DORYX) 100 MG DR capsule Take 100 mg by mouth 2 (two) times daily. Started 5/9 X 14 days      . Fluticasone Furoate-Vilanterol (BREO ELLIPTA) 100-25 MCG/INH AEPB Inhale 1 puff into the lungs every morning.       . Fluticasone Furoate-Vilanterol (BREO ELLIPTA) 100-25 MCG/INH AEPB Inhale 1 puff into the lungs at bedtime.      Marland Kitchen HYDROcodone-acetaminophen (NORCO) 7.5-325 MG per tablet Take 1-2 tablets by mouth every 4 (four) hours as needed for pain.  100 tablet  0  . methocarbamol (ROBAXIN) 500 MG tablet Take 1 tablet (500 mg total) by mouth every 6 (six) hours as needed (muscle spasms).  50 tablet  0  . nystatin (MYCOSTATIN/NYSTOP) 100000 UNIT/GM POWD Apply 1 Bottle topically 2 (two) times daily.    0  . omeprazole (PRILOSEC) 20 MG capsule Take 20 mg by mouth at bedtime.      . polyethylene glycol (MIRALAX / GLYCOLAX) packet Take 17 g by mouth daily as needed.  14 each  0  . pyridoxine (B-6) 100 MG tablet Take 100 mg by mouth at bedtime.      . traZODone (DESYREL) 50 MG tablet Take 50 mg by mouth at bedtime.       No current facility-administered medications for this visit.    There is no immunization history on file for this patient.  History  Substance Use Topics  . Smoking  status: Current Every Day Smoker -- 1.50 packs/day for 22 years    Types: Cigarettes  . Smokeless tobacco: Never Used  . Alcohol Use: No    Family History  Problem Relation Age of Onset  . Achondroplasia Father   . Congenital heart disease Mother   . Hypertension Mother   . Hypertension Sister     Review of Systems  Constitutional: Negative for fever, chills and malaise/fatigue.  HENT: Negative for congestion.   Eyes: Negative for blurred vision.  Respiratory: Positive for cough. Negative for shortness of breath.   Cardiovascular: Negative for chest pain.  Gastrointestinal: Negative for heartburn and constipation.  Genitourinary: Negative for dysuria.  Musculoskeletal: Positive for back pain and joint pain.  Skin: Negative for rash.  Neurological: Negative for dizziness and headaches.  Endo/Heme/Allergies: Bruises/bleeds easily.  Psychiatric/Behavioral: Negative for depression and memory loss.    Vital signs: BP 121/63  Pulse 62  Temp(Src) 97 F (36.1 C)  Resp 17  Ht 5\' 8"  (1.727 m)  Wt 180 lb (81.647 kg)  BMI 27.38 kg/m2  Physical Exam  Constitutional: She is oriented to person, place, and time. She appears well-nourished. No distress.  HENT:  Head: Normocephalic and atraumatic.  Right Ear: External ear normal.  Left Ear: External ear normal.  Nose: Nose normal.  Mouth/Throat: Oropharynx is clear and moist.  Eyes: EOM are normal. Pupils are equal, round, and reactive to light.  Neck: Normal range of motion.  Cardiovascular: Normal rate, regular rhythm, normal heart sounds and intact distal pulses.   Pulmonary/Chest: Effort normal and breath sounds normal.  Abdominal: Soft. Bowel sounds are normal. She exhibits no distension. There is no tenderness.  Musculoskeletal:  Right AKA s/p debridement 5/20, dressing clean and intact, no concerning erythema, mild serosanguinous drainage  Neurological: She is alert and oriented to person, place, and time. No cranial nerve  deficit.    Plan: 1. Cellulitis and abscess of leg --to complete ancef course for 2 wks, then on 6/7 to switch to keflex --upon review of MAR, she actually was given both of these here at the facility until today when I noted the error --she has had no untoward effects --did have debridement of hematoma at AKA site with improvement in pain --f/u with ID, Dr. Luciana Axe  2. Atrial fibrillation with RVR --stable at this time, no med changes needed at snf  3. Hypotension, unspecified --resolved  4. Insomnia --uses dramamine or benadryl and also on trazodone--risks have been discussed with her especially of falling on her stump resulting in even more surgery  5. S/P right AKA scar debridement --is improving, will f/u with Dr. Charlann Boxer  6. Expected blood loss anemia --f/u cbc   7. CHF (congestive heart failure) --now w/o evidence of acute exacerbation  8. GERD (gastroesophageal reflux disease) Stable with current regimen  9. COPD (chronic obstructive pulmonary disease) --records says she quit, but she admits she does still smoke --cont current inhalers as ordered  10. Tobacco abuse --encouraged smoking cessation for wound healing and long term health  D/c home with home health F/u Dr. Durene Romans in 2 weeks.   Keep appt with Dr. Staci Righter ID 07/17/12 at 3pm Cbc, bmp next draw and before d/c home

## 2012-07-12 ENCOUNTER — Emergency Department (HOSPITAL_COMMUNITY)
Admission: EM | Admit: 2012-07-12 | Discharge: 2012-07-12 | Disposition: A | Discharge status: Home / Self Care | Payer: Medicare Other | Attending: Emergency Medicine | Admitting: Emergency Medicine

## 2012-07-12 ENCOUNTER — Encounter (HOSPITAL_COMMUNITY): Payer: Self-pay | Admitting: *Deleted

## 2012-07-12 DIAGNOSIS — Z880 Allergy status to penicillin: Secondary | ICD-10-CM | POA: Insufficient documentation

## 2012-07-12 DIAGNOSIS — Z8709 Personal history of other diseases of the respiratory system: Secondary | ICD-10-CM | POA: Insufficient documentation

## 2012-07-12 DIAGNOSIS — Z8701 Personal history of pneumonia (recurrent): Secondary | ICD-10-CM | POA: Insufficient documentation

## 2012-07-12 DIAGNOSIS — Z86718 Personal history of other venous thrombosis and embolism: Secondary | ICD-10-CM | POA: Insufficient documentation

## 2012-07-12 DIAGNOSIS — Z452 Encounter for adjustment and management of vascular access device: Secondary | ICD-10-CM

## 2012-07-12 DIAGNOSIS — Z791 Long term (current) use of non-steroidal anti-inflammatories (NSAID): Secondary | ICD-10-CM | POA: Insufficient documentation

## 2012-07-12 DIAGNOSIS — M1990 Unspecified osteoarthritis, unspecified site: Secondary | ICD-10-CM | POA: Insufficient documentation

## 2012-07-12 DIAGNOSIS — I509 Heart failure, unspecified: Secondary | ICD-10-CM | POA: Insufficient documentation

## 2012-07-12 DIAGNOSIS — M069 Rheumatoid arthritis, unspecified: Secondary | ICD-10-CM | POA: Insufficient documentation

## 2012-07-12 DIAGNOSIS — J449 Chronic obstructive pulmonary disease, unspecified: Secondary | ICD-10-CM | POA: Insufficient documentation

## 2012-07-12 DIAGNOSIS — Z8673 Personal history of transient ischemic attack (TIA), and cerebral infarction without residual deficits: Secondary | ICD-10-CM | POA: Insufficient documentation

## 2012-07-12 DIAGNOSIS — T82598A Other mechanical complication of other cardiac and vascular devices and implants, initial encounter: Secondary | ICD-10-CM | POA: Insufficient documentation

## 2012-07-12 DIAGNOSIS — Z87448 Personal history of other diseases of urinary system: Secondary | ICD-10-CM | POA: Insufficient documentation

## 2012-07-12 DIAGNOSIS — F17200 Nicotine dependence, unspecified, uncomplicated: Secondary | ICD-10-CM | POA: Insufficient documentation

## 2012-07-12 DIAGNOSIS — Z8679 Personal history of other diseases of the circulatory system: Secondary | ICD-10-CM | POA: Insufficient documentation

## 2012-07-12 DIAGNOSIS — G709 Myoneural disorder, unspecified: Secondary | ICD-10-CM | POA: Insufficient documentation

## 2012-07-12 DIAGNOSIS — S78119A Complete traumatic amputation at level between unspecified hip and knee, initial encounter: Secondary | ICD-10-CM | POA: Insufficient documentation

## 2012-07-12 DIAGNOSIS — Z79899 Other long term (current) drug therapy: Secondary | ICD-10-CM | POA: Insufficient documentation

## 2012-07-12 DIAGNOSIS — J4489 Other specified chronic obstructive pulmonary disease: Secondary | ICD-10-CM | POA: Insufficient documentation

## 2012-07-12 DIAGNOSIS — Y832 Surgical operation with anastomosis, bypass or graft as the cause of abnormal reaction of the patient, or of later complication, without mention of misadventure at the time of the procedure: Secondary | ICD-10-CM | POA: Insufficient documentation

## 2012-07-12 DIAGNOSIS — Z96659 Presence of unspecified artificial knee joint: Secondary | ICD-10-CM | POA: Insufficient documentation

## 2012-07-12 DIAGNOSIS — K219 Gastro-esophageal reflux disease without esophagitis: Secondary | ICD-10-CM | POA: Insufficient documentation

## 2012-07-12 DIAGNOSIS — D518 Other vitamin B12 deficiency anemias: Secondary | ICD-10-CM | POA: Insufficient documentation

## 2012-07-12 DIAGNOSIS — IMO0002 Reserved for concepts with insufficient information to code with codable children: Secondary | ICD-10-CM | POA: Insufficient documentation

## 2012-07-12 NOTE — ED Notes (Signed)
Pt had a revision of her right AKA done 07/06/12 and at DC was sent home with a single lumen PICC for long term IV antibiotic tx. She had [facility] come out to her home 07/12/12 and changed the dressing for her but the pt self administers IV push antibiotics three times a day. She states that the site around PICC is uncomfortable and has been leaking around the site this morning since she's been up this morning.

## 2012-07-12 NOTE — ED Provider Notes (Signed)
History     CSN: [id number]  Arrival date & time 07/12/12  1343   First MD Initiated Contact with Patient 07/12/12 1345      Chief Complaint  Patient presents with  . Vascular Access Problem    PICC line leaking around the site     HPI Patient is a single lumen right upper remedy PICC line for IV antibiotics use.  She is getting cephalosporins 3 times a day and she has a total of 7 doses left.  She reports it feels like there is a small amount of fluid and a possible air bubble right at the insertion site of her PICC line the right upper extremity.  She has not changed the bandage since Monday.  There is no underlying fluid collection.  She denies fevers and chills.  No surrounding redness or warmth.  No swelling of her right upper extremity.  She feels all of the fluids go up her PICC line when pushed through there.  She spoke with her home health nurse who asked that she come emergency department to be evaluated.  The patient is without other complaints.    Past Medical History  Diagnosis Date  . Emphysema   . Atrial fibrillation   . COPD (chronic obstructive pulmonary disease)   . History of atrial fibrillation     S/pP Ablation  . Asthma   . Arthritis   . Osteoarthritis   . Rheumatoid arthritis(714.0)   . Anemia   . B12 deficiency anemia   . Stroke     slight stroke in 2009-no deficits  . Neuromuscular disorder     hx of right quadricep rupture  . DJD (degenerative joint disease)   . History of benign bladder tumor   . DVT (deep venous thrombosis)   . Blood transfusion     2009   . Pneumonia     hx of  . GERD (gastroesophageal reflux disease)   . CHF (congestive heart failure)   . Tobacco abuse     Past Surgical History  Procedure Laterality Date  . Replacement total knee    . Cardiac electrophysiology mapping and ablation    . Cholecystectomy  1996  . Cataract extraction, bilateral    . Hematoma evacuation  2009    From right groin  . Joint replacement       Left-1996, Right-1995  . Multiple tooth extractions  06/30/2010  . Trigger finger release  2011  . Amputation  12/17/2010    Procedure: AMPUTATION ABOVE KNEE;  Surgeon: [name];  Location: [facility];  Service: Orthopedics;  Laterality: Right;  . Application of wound vac  12/17/2010    Procedure: APPLICATION OF WOUND VAC;  Surgeon: [name];  Location: [facility];  Service: Orthopedics;  Laterality: Right;  wound Vac applied @ 2045 By Luna Fuse  . Amputation  04/27/2011    Procedure: AMPUTATION ABOVE KNEE;  Surgeon: [name], MD;  Location: [facility];  Service: Orthopedics;  Laterality: Right;  Revision Right above knee amputation.  . Cardiac catheterization    . Cardioversion      x5 times  . Amputation  02/28/2012    Procedure: AMPUTATION BELOW KNEE;  Surgeon: [name], MD;  Location: [facility];  Service: Orthopedics;  Laterality: Right;  SCAR REVISION  . Tenosynovectomy      left long and ring fingers on 04/19/12  . Amputation Right 06/06/2012    Procedure: REVISION ABOVE RIGHT KNEE AMPUTATION, STUMP ;  Surgeon: [name], MD;  Location: [facility];  Service: Orthopedics;  Laterality: Right;  . I&d extremity Right 06/27/2012    Procedure: IRRIGATION AND DEBRIDEMENT hematoma right AKA;  Surgeon: [name], MD;  Location: [facility];  Service: Orthopedics;  Laterality: Right;    Family History  Problem Relation Age of Onset  . Achondroplasia Father   . Congenital heart disease Mother   . Hypertension Mother   . Hypertension Sister     History  Substance Use Topics  . Smoking status: Current Every Day Smoker -- 1.50 packs/day for 22 years    Types: Cigarettes  . Smokeless tobacco: Never Used  . Alcohol Use: No    OB History   Grav Para Term Preterm Abortions TAB SAB Ect Mult Living                  Review of Systems  Constitutional: Negative for fever and chills.  Cardiovascular: Negative for chest pain.    Allergies  Sulfa antibiotics; Avelox; Tizanidine; and  Penicillins  Home Medications   Current Outpatient Rx  Name  Route  Sig  Dispense  Refill  . amiodarone (PACERONE) 200 MG tablet   Oral   Take 200 mg by mouth every evening.          Marland Kitchen ascorbic acid (VITAMIN C) 1000 MG tablet   Oral   Take 1,000 mg by mouth at bedtime.         Marland Kitchen ceFAZolin (ANCEF) 1-5 GM-%   Intravenous   Inject 50 mLs (1 g total) into the vein every 8 (eight) hours. Through 07/14/12 (for 2 week course)   2100 mL   0     For 2 weeks and then switch to oral Keflex. HHRN  ...   . celecoxib (CELEBREX) 200 MG capsule   Oral   Take 200 mg by mouth every evening.          . Cephalexin 500 MG tablet   Oral   Take 1 tablet (500 mg total) by mouth 4 (four) times daily. X 14 days (after finish ancef)   56 tablet   0   . CRANBERRY PO   Oral   Take 2 tablets by mouth at bedtime.         . dabigatran (PRADAXA) 150 MG CAPS   Oral   Take 150 mg by mouth every 12 (twelve) hours.         Marland Kitchen dimenhyDRINATE (DRAMAMINE) 50 MG tablet   Oral   Take 50 mg by mouth at bedtime as needed (sleep).         . diphenhydrAMINE (BENADRYL) 25 mg capsule   Oral   Take 50 mg by mouth at bedtime as needed for itching or sleep.         Marland Kitchen doxycycline (DORYX) 100 MG DR capsule   Oral   Take 100 mg by mouth 2 (two) times daily. Started 5/9 X 14 days         . Fluticasone Furoate-Vilanterol (BREO ELLIPTA) 100-25 MCG/INH AEPB   Inhalation   Inhale 1 puff into the lungs every morning.          . Fluticasone Furoate-Vilanterol (BREO ELLIPTA) 100-25 MCG/INH AEPB   Inhalation   Inhale 1 puff into the lungs at bedtime.         Marland Kitchen HYDROcodone-acetaminophen (NORCO) 7.5-325 MG per tablet   Oral   Take 1-2 tablets by mouth every 4 (four) hours as needed  for pain.   100 tablet   0   . methocarbamol (ROBAXIN) 500 MG tablet   Oral   Take 1 tablet (500 mg total) by mouth every 6 (six) hours as needed (muscle spasms).   50 tablet   0   . nystatin (MYCOSTATIN/NYSTOP)  100000 UNIT/GM POWD   Topical   Apply 1 Bottle topically 2 (two) times daily.      0   . omeprazole (PRILOSEC) 20 MG capsule   Oral   Take 20 mg by mouth at bedtime.         . polyethylene glycol (MIRALAX / GLYCOLAX) packet   Oral   Take 17 g by mouth daily as needed.   14 each   0   . pyridoxine (B-6) 100 MG tablet   Oral   Take 100 mg by mouth at bedtime.         . traZODone (DESYREL) 50 MG tablet   Oral   Take 50 mg by mouth at bedtime.           BP 111/62  Pulse 64  Temp(Src) 98.1 F (36.7 C) (Oral)  Ht 5\' 11"  (1.803 m)  Wt 165 lb (74.844 kg)  BMI 23.02 kg/m2  SpO2 95%  Physical Exam  Nursing note and vitals reviewed. Constitutional: She is oriented to person, place, and time. She appears well-developed and well-nourished.  HENT:  Head: Normocephalic.  Eyes: EOM are normal.  Neck: Normal range of motion.  Pulmonary/Chest: Effort normal.  Abdominal: She exhibits no distension.  Musculoskeletal: Normal range of motion.  PICC line and right upper extremity flushes without any difficulty.  There is no infiltration.  There is no surrounding erythema.  There is no significant fluid collection.  A small air bubble does come back from what appears to be the area right where the PICC was inserted into the skin.  I think this is negligible and not clinically relevant.  Neurological: She is alert and oriented to person, place, and time.  Psychiatric: She has a normal mood and affect.    ED Course  Procedures (including critical care time)  Labs Reviewed - No data to display No results found.   1. PICC (peripherally inserted central catheter) in place       MDM  Patient's PICC line seems to be working just fine.  I spoke with the patient and the patient's family at length and we all agreed that this seems to be functioning just fine.  She gets a taste of saline in her moutht.  There is no significant infiltration.  No signs of infection.  Discharge home in  good condition.  Shared decision-making        Lyanne Co, MD 07/12/12 [phone]

## 2012-07-17 ENCOUNTER — Encounter: Payer: Self-pay | Admitting: Internal Medicine

## 2012-07-17 ENCOUNTER — Ambulatory Visit (INDEPENDENT_AMBULATORY_CARE_PROVIDER_SITE_OTHER): Payer: Medicare Other | Admitting: Internal Medicine

## 2012-07-17 VITALS — BP 131/70 | HR 62 | Temp 98.4°F | Wt 165.0 lb

## 2012-07-17 DIAGNOSIS — L03119 Cellulitis of unspecified part of limb: Secondary | ICD-10-CM

## 2012-07-17 DIAGNOSIS — L02419 Cutaneous abscess of limb, unspecified: Secondary | ICD-10-CM

## 2012-07-17 NOTE — Assessment & Plan Note (Signed)
It appears that this was all cellulitis with no abscess. She has completed 2 weeks of IV antibiotics and now is on Keflex orally. The infection has cleared and she is getting continuation therapy by Dr. Charlann Boxer.  She will be more than adequately covered by her current duration of antibiotics. I am happy to have her return if needed otherwise she will return on a p.r.n. Basis.

## 2012-07-17 NOTE — Progress Notes (Signed)
  Subjective:    Patient ID: [name], female    DOB: [birth year], 67 y.o.   MRN: [id number]  HPI She comes in in for followup of her cellulitis of her right stump above-the-knee amputation.  She is followed by Dr. Charlann Boxer and was recently hospitalized with cellulitis of the right stump. She did have notable fluid collection though no signs of deep infection or abscess. She did have debridement of the fluid and was not infected. She was placed on cefazolin IV for about 2 weeks for which she completed last week and had her PICC line removed. She was then placed on Keflex by Dr. Charlann Boxer and has what sounds like another month's worth of antibiotics. She is doing well and no fever or chills. She had no problems with the PICC line.     Review of Systems  Constitutional: Negative for fever and chills.  Gastrointestinal: Negative for nausea and diarrhea.  Musculoskeletal: Negative for arthralgias.  Skin: Negative for rash.  Neurological: Negative for dizziness and headaches.       Objective:   Physical Exam  Constitutional: She appears well-developed and well-nourished. No distress.  In wheelchair  Musculoskeletal:  Right stump was healing incision. She does have a small 1/2 cm lesion with packing but no discharge, no smell and no surrounding erythema or warmth. It is nontender  Skin: No rash noted.          Assessment & Plan:

## 2012-09-08 ENCOUNTER — Encounter (HOSPITAL_COMMUNITY): Payer: Self-pay | Admitting: *Deleted

## 2012-09-08 NOTE — Progress Notes (Signed)
Need orders in EPIC.  Surgery scheduled for 09/12/12.  Thank You.

## 2012-09-11 NOTE — Progress Notes (Signed)
PT'S MOST RECENT PULMONARY FUNCTION STUDY 08/10/12 AND MOST RECENT PULMONARY OFFICE NOTES 08/28/12 ON PT'S CHART FROM DR. MARK DONER WITH [facility] [place]. PT'S MOST RECENT CARDIOLOGY OFFICE NOTES 12/06/11 DR. TYSON ON HER CHART FROM [facility] [place].

## 2012-09-12 ENCOUNTER — Encounter (HOSPITAL_COMMUNITY): Payer: Self-pay | Admitting: Anesthesiology

## 2012-09-12 ENCOUNTER — Encounter (HOSPITAL_COMMUNITY): Payer: Self-pay | Admitting: *Deleted

## 2012-09-12 ENCOUNTER — Encounter (HOSPITAL_COMMUNITY)
Admission: RE | Disposition: A | Discharge status: Home Health | Payer: Self-pay | Source: Ambulatory Visit | Attending: Orthopedic Surgery

## 2012-09-12 ENCOUNTER — Encounter (HOSPITAL_COMMUNITY): Payer: Self-pay | Admitting: Pharmacy Technician

## 2012-09-12 ENCOUNTER — Inpatient Hospital Stay (HOSPITAL_COMMUNITY)
Admission: RE | Admit: 2012-09-12 | Discharge: 2012-09-15 | DRG: 464 | Disposition: A | Discharge status: Home Health | Payer: Medicare Other | Source: Ambulatory Visit | Attending: Orthopedic Surgery | Admitting: Orthopedic Surgery

## 2012-09-12 ENCOUNTER — Inpatient Hospital Stay (HOSPITAL_COMMUNITY): Payer: Medicare Other | Admitting: Anesthesiology

## 2012-09-12 DIAGNOSIS — Y835 Amputation of limb(s) as the cause of abnormal reaction of the patient, or of later complication, without mention of misadventure at the time of the procedure: Secondary | ICD-10-CM | POA: Diagnosis present

## 2012-09-12 DIAGNOSIS — Z8673 Personal history of transient ischemic attack (TIA), and cerebral infarction without residual deficits: Secondary | ICD-10-CM

## 2012-09-12 DIAGNOSIS — K219 Gastro-esophageal reflux disease without esophagitis: Secondary | ICD-10-CM | POA: Diagnosis present

## 2012-09-12 DIAGNOSIS — T8789 Other complications of amputation stump: Principal | ICD-10-CM | POA: Diagnosis present

## 2012-09-12 DIAGNOSIS — D62 Acute posthemorrhagic anemia: Secondary | ICD-10-CM | POA: Diagnosis not present

## 2012-09-12 DIAGNOSIS — Z87891 Personal history of nicotine dependence: Secondary | ICD-10-CM

## 2012-09-12 DIAGNOSIS — S88919A Complete traumatic amputation of unspecified lower leg, level unspecified, initial encounter: Secondary | ICD-10-CM

## 2012-09-12 DIAGNOSIS — I4891 Unspecified atrial fibrillation: Secondary | ICD-10-CM | POA: Diagnosis present

## 2012-09-12 DIAGNOSIS — M1990 Unspecified osteoarthritis, unspecified site: Secondary | ICD-10-CM | POA: Diagnosis present

## 2012-09-12 DIAGNOSIS — M069 Rheumatoid arthritis, unspecified: Secondary | ICD-10-CM | POA: Diagnosis present

## 2012-09-12 DIAGNOSIS — J4489 Other specified chronic obstructive pulmonary disease: Secondary | ICD-10-CM | POA: Diagnosis present

## 2012-09-12 DIAGNOSIS — J449 Chronic obstructive pulmonary disease, unspecified: Secondary | ICD-10-CM | POA: Diagnosis present

## 2012-09-12 DIAGNOSIS — Z79899 Other long term (current) drug therapy: Secondary | ICD-10-CM

## 2012-09-12 DIAGNOSIS — D500 Iron deficiency anemia secondary to blood loss (chronic): Secondary | ICD-10-CM | POA: Diagnosis present

## 2012-09-12 HISTORY — PX: IRRIGATION AND DEBRIDEMENT KNEE: SHX5185

## 2012-09-12 LAB — BASIC METABOLIC PANEL
Calcium: 9.5 mg/dL (ref 8.4–10.5)
GFR calc non Af Amer: >90 mL/min (ref >90)
Glucose, Bld: 89 mg/dL (ref 70–99)
Sodium: 137 mEq/L (ref 135–145)

## 2012-09-12 LAB — PROTIME-INR
INR: 0.97 (ref 0.00–1.49)
Prothrombin Time: 12.7 seconds (ref 11.6–15.2)

## 2012-09-12 LAB — CBC
MCH: 36.7 pg — ABNORMAL HIGH (ref 26.0–34.0)
MCHC: 34.7 g/dL (ref 30.0–36.0)
Platelets: 219 10*3/uL (ref 150–400)
RDW: 13 % (ref 11.5–15.5)

## 2012-09-12 LAB — TYPE AND SCREEN

## 2012-09-12 SURGERY — IRRIGATION AND DEBRIDEMENT KNEE
Anesthesia: General | Site: Knee | Laterality: Right | Wound class: Clean Contaminated

## 2012-09-12 MED ORDER — PREGABALIN 50 MG PO CAPS
50.0000 mg | ORAL_CAPSULE | Freq: Two times a day (BID) | ORAL | Status: DC | PRN
  Administered 2012-09-14: 50 mg via ORAL
  Filled 2012-09-12: qty 1

## 2012-09-12 MED ORDER — PANTOPRAZOLE SODIUM 40 MG PO TBEC
40.0000 mg | DELAYED_RELEASE_TABLET | Freq: Every day | ORAL | Status: DC
  Administered 2012-09-13 – 2012-09-15 (×3): 40 mg via ORAL
  Filled 2012-09-12 (×3): qty 1

## 2012-09-12 MED ORDER — CLINDAMYCIN PHOSPHATE 600 MG/50ML IV SOLN
600.0000 mg | Freq: Four times a day (QID) | INTRAVENOUS | Status: AC
  Administered 2012-09-12 – 2012-09-13 (×2): 600 mg via INTRAVENOUS
  Filled 2012-09-12 (×2): qty 50

## 2012-09-12 MED ORDER — DIPHENHYDRAMINE HCL 25 MG PO CAPS: 50.0000 mg | ORAL_CAPSULE | Freq: Every evening | ORAL | Status: DC | PRN

## 2012-09-12 MED ORDER — LIDOCAINE HCL (CARDIAC) 20 MG/ML IV SOLN
INTRAVENOUS | Status: DC | PRN
  Administered 2012-09-12: 100 mg via INTRAVENOUS

## 2012-09-12 MED ORDER — DIPHENHYDRAMINE HCL 25 MG PO CAPS: 25.0000 mg | ORAL_CAPSULE | Freq: Four times a day (QID) | ORAL | Status: DC | PRN

## 2012-09-12 MED ORDER — FENTANYL CITRATE 0.05 MG/ML IJ SOLN
INTRAMUSCULAR | Status: DC | PRN
  Administered 2012-09-12 (×4): 25 ug via INTRAVENOUS

## 2012-09-12 MED ORDER — HYDROMORPHONE HCL PF 1 MG/ML IJ SOLN
0.2500 mg | INTRAMUSCULAR | Status: DC | PRN
  Administered 2012-09-12 (×6): 0.5 mg via INTRAVENOUS

## 2012-09-12 MED ORDER — NYSTATIN 100000 UNIT/GM EX POWD
1.0000 | Freq: Two times a day (BID) | CUTANEOUS | Status: DC | PRN
Start: 2012-09-12 — End: 2012-09-15
  Filled 2012-09-12: qty 15

## 2012-09-12 MED ORDER — ALUM & MAG HYDROXIDE-SIMETH 200-200-20 MG/5ML PO SUSP: 30.0000 mL | ORAL | Status: DC | PRN

## 2012-09-12 MED ORDER — ASPIRIN EC 325 MG PO TBEC
325.0000 mg | DELAYED_RELEASE_TABLET | Freq: Every day | ORAL | Status: DC
  Administered 2012-09-13 – 2012-09-15 (×3): 325 mg via ORAL
  Filled 2012-09-12 (×3): qty 1

## 2012-09-12 MED ORDER — AMIODARONE HCL 200 MG PO TABS
200.0000 mg | ORAL_TABLET | Freq: Every evening | ORAL | Status: DC
  Administered 2012-09-12 – 2012-09-14 (×3): 200 mg via ORAL
  Filled 2012-09-12 (×5): qty 1

## 2012-09-12 MED ORDER — MIDAZOLAM HCL 5 MG/5ML IJ SOLN
INTRAMUSCULAR | Status: DC | PRN
  Administered 2012-09-12: 1 mg via INTRAVENOUS

## 2012-09-12 MED ORDER — ONDANSETRON HCL 4 MG/2ML IJ SOLN
INTRAMUSCULAR | Status: DC | PRN
  Administered 2012-09-12: 4 mg via INTRAVENOUS

## 2012-09-12 MED ORDER — ALBUTEROL SULFATE HFA 108 (90 BASE) MCG/ACT IN AERS
2.0000 | INHALATION_SPRAY | Freq: Four times a day (QID) | RESPIRATORY_TRACT | Status: DC | PRN
  Filled 2012-09-12: qty 6.7

## 2012-09-12 MED ORDER — SODIUM CHLORIDE 0.9 % IV SOLN
INTRAVENOUS | Status: DC
  Administered 2012-09-12 – 2012-09-13 (×3): via INTRAVENOUS
  Filled 2012-09-12 (×10): qty 1000

## 2012-09-12 MED ORDER — CLINDAMYCIN PHOSPHATE 900 MG/50ML IV SOLN
900.0000 mg | INTRAVENOUS | Status: AC
  Administered 2012-09-12: 900 mg via INTRAVENOUS

## 2012-09-12 MED ORDER — OXYCODONE HCL 5 MG PO TABS: 5.0000 mg | ORAL_TABLET | Freq: Once | ORAL | Status: DC | PRN

## 2012-09-12 MED ORDER — PROMETHAZINE HCL 25 MG/ML IJ SOLN: 6.2500 mg | INTRAMUSCULAR | Status: DC | PRN

## 2012-09-12 MED ORDER — HYPROMELLOSE (GONIOSCOPIC) 2.5 % OP SOLN: 1.0000 [drp] | Freq: Every day | OPHTHALMIC | Status: DC | PRN

## 2012-09-12 MED ORDER — FERROUS SULFATE 325 (65 FE) MG PO TABS
325.0000 mg | ORAL_TABLET | Freq: Three times a day (TID) | ORAL | Status: DC
  Administered 2012-09-12 – 2012-09-15 (×9): 325 mg via ORAL
  Filled 2012-09-12 (×11): qty 1

## 2012-09-12 MED ORDER — PHENOL 1.4 % MT LIQD
1.0000 | OROMUCOSAL | Status: DC | PRN
  Filled 2012-09-12: qty 177

## 2012-09-12 MED ORDER — PROPOFOL 10 MG/ML IV BOLUS
INTRAVENOUS | Status: DC | PRN
  Administered 2012-09-12: 150 mg via INTRAVENOUS

## 2012-09-12 MED ORDER — HYDROMORPHONE HCL PF 1 MG/ML IJ SOLN: 0.5000 mg | INTRAMUSCULAR | Status: DC | PRN

## 2012-09-12 MED ORDER — TRAZODONE HCL 50 MG PO TABS
50.0000 mg | ORAL_TABLET | Freq: Every day | ORAL | Status: DC
  Administered 2012-09-12 – 2012-09-14 (×3): 50 mg via ORAL
  Filled 2012-09-12 (×4): qty 1

## 2012-09-12 MED ORDER — HYDROCODONE-ACETAMINOPHEN 7.5-325 MG PO TABS
1.0000 | ORAL_TABLET | ORAL | Status: DC
  Administered 2012-09-12 – 2012-09-14 (×9): 2 via ORAL
  Administered 2012-09-14: 1 via ORAL
  Administered 2012-09-14: 2 via ORAL
  Administered 2012-09-15: 1 via ORAL
  Administered 2012-09-15: 2 via ORAL
  Administered 2012-09-15: 1 via ORAL
  Filled 2012-09-12 (×8): qty 2
  Filled 2012-09-12: qty 1
  Filled 2012-09-12: qty 2
  Filled 2012-09-12: qty 1
  Filled 2012-09-12 (×3): qty 2

## 2012-09-12 MED ORDER — SODIUM CHLORIDE 0.9 % IR SOLN
Status: DC | PRN
  Administered 2012-09-12: 1000 mL

## 2012-09-12 MED ORDER — ZOLPIDEM TARTRATE 5 MG PO TABS: 5.0000 mg | ORAL_TABLET | Freq: Every evening | ORAL | Status: DC | PRN

## 2012-09-12 MED ORDER — METHOCARBAMOL 100 MG/ML IJ SOLN
500.0000 mg | Freq: Four times a day (QID) | INTRAVENOUS | Status: DC | PRN
  Administered 2012-09-12: 500 mg via INTRAVENOUS
  Filled 2012-09-12 (×2): qty 5

## 2012-09-12 MED ORDER — ONDANSETRON HCL 4 MG/2ML IJ SOLN
4.0000 mg | Freq: Four times a day (QID) | INTRAMUSCULAR | Status: DC | PRN
  Administered 2012-09-13: 4 mg via INTRAVENOUS
  Filled 2012-09-12: qty 2

## 2012-09-12 MED ORDER — ONDANSETRON HCL 4 MG PO TABS: 4.0000 mg | ORAL_TABLET | Freq: Four times a day (QID) | ORAL | Status: DC | PRN

## 2012-09-12 MED ORDER — 0.9 % SODIUM CHLORIDE (POUR BTL) OPTIME
TOPICAL | Status: DC | PRN
  Administered 2012-09-12: 1000 mL

## 2012-09-12 MED ORDER — CHLORHEXIDINE GLUCONATE 4 % EX LIQD
60.0000 mL | Freq: Once | CUTANEOUS | Status: DC
  Filled 2012-09-12: qty 60

## 2012-09-12 MED ORDER — FLEET ENEMA 7-19 GM/118ML RE ENEM: 1.0000 | ENEMA | Freq: Once | RECTAL | Status: AC | PRN

## 2012-09-12 MED ORDER — OXYCODONE HCL 5 MG/5ML PO SOLN
5.0000 mg | Freq: Once | ORAL | Status: DC | PRN
  Filled 2012-09-12: qty 5

## 2012-09-12 MED ORDER — FLUTICASONE FUROATE-VILANTEROL 100-25 MCG/INH IN AEPB: 1.0000 | INHALATION_SPRAY | Freq: Every day | RESPIRATORY_TRACT | Status: DC

## 2012-09-12 MED ORDER — METHOCARBAMOL 500 MG PO TABS
500.0000 mg | ORAL_TABLET | Freq: Four times a day (QID) | ORAL | Status: DC | PRN
  Administered 2012-09-13 – 2012-09-15 (×4): 500 mg via ORAL
  Filled 2012-09-12 (×4): qty 1

## 2012-09-12 MED ORDER — CELECOXIB 200 MG PO CAPS
200.0000 mg | ORAL_CAPSULE | Freq: Every evening | ORAL | Status: DC
  Administered 2012-09-12 – 2012-09-14 (×3): 200 mg via ORAL
  Filled 2012-09-12 (×5): qty 1

## 2012-09-12 MED ORDER — BISACODYL 10 MG RE SUPP: 10.0000 mg | Freq: Every day | RECTAL | Status: DC | PRN

## 2012-09-12 MED ORDER — HYDROMORPHONE HCL PF 1 MG/ML IJ SOLN
INTRAMUSCULAR | Status: AC
  Filled 2012-09-12: qty 1

## 2012-09-12 MED ORDER — HYDROMORPHONE HCL PF 1 MG/ML IJ SOLN: 0.2500 mg | INTRAMUSCULAR | Status: DC | PRN

## 2012-09-12 MED ORDER — DABIGATRAN ETEXILATE MESYLATE 150 MG PO CAPS
150.0000 mg | ORAL_CAPSULE | Freq: Two times a day (BID) | ORAL | Status: DC
  Administered 2012-09-13 – 2012-09-15 (×5): 150 mg via ORAL
  Filled 2012-09-12 (×6): qty 1

## 2012-09-12 MED ORDER — DOCUSATE SODIUM 100 MG PO CAPS
100.0000 mg | ORAL_CAPSULE | Freq: Two times a day (BID) | ORAL | Status: DC
  Administered 2012-09-12 – 2012-09-15 (×6): 100 mg via ORAL

## 2012-09-12 MED ORDER — POLYVINYL ALCOHOL 1.4 % OP SOLN
1.0000 [drp] | Freq: Every day | OPHTHALMIC | Status: DC | PRN
  Filled 2012-09-12: qty 15

## 2012-09-12 MED ORDER — DIMENHYDRINATE 50 MG PO TABS
50.0000 mg | ORAL_TABLET | Freq: Every evening | ORAL | Status: DC | PRN
  Filled 2012-09-12: qty 1

## 2012-09-12 MED ORDER — LACTATED RINGERS IV SOLN
INTRAVENOUS | Status: DC
  Administered 2012-09-12: 16:00:00 via INTRAVENOUS
  Administered 2012-09-12: 1000 mL via INTRAVENOUS

## 2012-09-12 MED ORDER — METOCLOPRAMIDE HCL 10 MG PO TABS: 5.0000 mg | ORAL_TABLET | Freq: Three times a day (TID) | ORAL | Status: DC | PRN

## 2012-09-12 MED ORDER — POLYETHYLENE GLYCOL 3350 17 G PO PACK
17.0000 g | PACK | Freq: Two times a day (BID) | ORAL | Status: DC
  Administered 2012-09-12 – 2012-09-15 (×4): 17 g via ORAL

## 2012-09-12 MED ORDER — MEPERIDINE HCL 50 MG/ML IJ SOLN: 6.2500 mg | INTRAMUSCULAR | Status: DC | PRN

## 2012-09-12 MED ORDER — METOCLOPRAMIDE HCL 5 MG/ML IJ SOLN: 5.0000 mg | Freq: Three times a day (TID) | INTRAMUSCULAR | Status: DC | PRN

## 2012-09-12 MED ORDER — MENTHOL 3 MG MT LOZG
1.0000 | LOZENGE | OROMUCOSAL | Status: DC | PRN
  Filled 2012-09-12: qty 9

## 2012-09-12 SURGICAL SUPPLY — 46 items
BAG ZIPLOCK 12X15 (MISCELLANEOUS) ×2 IMPLANT
BANDAGE ELASTIC 6 VELCRO ST LF (GAUZE/BANDAGES/DRESSINGS) ×2 IMPLANT
BANDAGE ESMARK 6X9 LF (GAUZE/BANDAGES/DRESSINGS) ×1 IMPLANT
BNDG ESMARK 6X9 LF (GAUZE/BANDAGES/DRESSINGS) ×2
CLOTH BEACON ORANGE TIMEOUT ST (SAFETY) ×2 IMPLANT
CONT SPECI 4OZ STER CLIK (MISCELLANEOUS) IMPLANT
DERMABOND ADVANCED (GAUZE/BANDAGES/DRESSINGS)
DERMABOND ADVANCED .7 DNX12 (GAUZE/BANDAGES/DRESSINGS) IMPLANT
DRAPE EXTREMITY T 121X128X90 (DRAPE) ×2 IMPLANT
DRAPE U-SHAPE 47X51 STRL (DRAPES) ×2 IMPLANT
DRSG ADAPTIC 3X8 NADH LF (GAUZE/BANDAGES/DRESSINGS) IMPLANT
DRSG AQUACEL AG ADV 3.5X10 (GAUZE/BANDAGES/DRESSINGS) IMPLANT
DRSG MEPILEX BORDER 4X8 (GAUZE/BANDAGES/DRESSINGS) ×2 IMPLANT
DRSG PAD ABDOMINAL 8X10 ST (GAUZE/BANDAGES/DRESSINGS) IMPLANT
DRSG TEGADERM 4X4.75 (GAUZE/BANDAGES/DRESSINGS) IMPLANT
DURAPREP 26ML APPLICATOR (WOUND CARE) ×2 IMPLANT
ELECT REM PT RETURN 9FT ADLT (ELECTROSURGICAL) ×2
ELECTRODE REM PT RTRN 9FT ADLT (ELECTROSURGICAL) ×1 IMPLANT
EVACUATOR 1/8 PVC DRAIN (DRAIN) IMPLANT
GAUZE SPONGE 2X2 8PLY STRL LF (GAUZE/BANDAGES/DRESSINGS) IMPLANT
GLOVE BIOGEL PI IND STRL 7.5 (GLOVE) ×1 IMPLANT
GLOVE BIOGEL PI IND STRL 8 (GLOVE) ×1 IMPLANT
GLOVE BIOGEL PI INDICATOR 7.5 (GLOVE) ×1
GLOVE BIOGEL PI INDICATOR 8 (GLOVE) ×1
GLOVE ECLIPSE 8.0 STRL XLNG CF (GLOVE) ×2 IMPLANT
GLOVE ORTHO TXT STRL SZ7.5 (GLOVE) ×2 IMPLANT
GOWN BRE IMP PREV XXLGXLNG (GOWN DISPOSABLE) ×4 IMPLANT
GOWN STRL NON-REIN LRG LVL3 (GOWN DISPOSABLE) ×2 IMPLANT
HANDPIECE INTERPULSE COAX TIP (DISPOSABLE) ×1
KIT BASIN OR (CUSTOM PROCEDURE TRAY) ×2 IMPLANT
MANIFOLD NEPTUNE II (INSTRUMENTS) ×2 IMPLANT
PACK TOTAL JOINT (CUSTOM PROCEDURE TRAY) ×2 IMPLANT
PADDING CAST COTTON 6X4 STRL (CAST SUPPLIES) IMPLANT
POSITIONER SURGICAL ARM (MISCELLANEOUS) ×2 IMPLANT
SET HNDPC FAN SPRY TIP SCT (DISPOSABLE) ×1 IMPLANT
SPONGE GAUZE 2X2 STER 10/PKG (GAUZE/BANDAGES/DRESSINGS)
SPONGE GAUZE 4X4 12PLY (GAUZE/BANDAGES/DRESSINGS) IMPLANT
STAPLER VISISTAT 35W (STAPLE) IMPLANT
SUT ETHILON 2 0 PS N (SUTURE) ×6 IMPLANT
SUT MNCRL AB 4-0 PS2 18 (SUTURE) IMPLANT
SUT VIC AB 1 CT1 36 (SUTURE) IMPLANT
SUT VIC AB 2-0 CT1 27 (SUTURE) ×2
SUT VIC AB 2-0 CT1 TAPERPNT 27 (SUTURE) ×2 IMPLANT
SWAB COLLECTION DEVICE MRSA (MISCELLANEOUS) IMPLANT
TOWEL OR 17X26 10 PK STRL BLUE (TOWEL DISPOSABLE) ×2 IMPLANT
TUBE ANAEROBIC SPECIMEN COL (MISCELLANEOUS) IMPLANT

## 2012-09-12 NOTE — Anesthesia Preprocedure Evaluation (Addendum)
Anesthesia Evaluation  Patient identified by MRN, date of birth, ID band Patient awake    Reviewed: Allergy & Precautions, H&P , NPO status , Patient's Chart, lab work & pertinent test results  Airway Mallampati: II TM Distance: >3 FB Neck ROM: full    Dental no notable dental hx. (+) Dental Advisory Given   Pulmonary neg pulmonary ROS, asthma , pneumonia -, resolved, COPD COPD inhaler, former smoker,  breath sounds clear to auscultation Productive cough as per usual. No distress today.  + wheezing      Cardiovascular +CHF negative cardio ROS  + dysrhythmias Atrial Fibrillation Rhythm:regular Rate:Normal     Neuro/Psych  Neuromuscular disease CVA, No Residual Symptoms negative neurological ROS  negative psych ROS   GI/Hepatic negative GI ROS, Neg liver ROS, GERD-  ,  Endo/Other  negative endocrine ROS  Renal/GU negative Renal ROS  negative genitourinary   Musculoskeletal negative musculoskeletal ROS (+) Arthritis -, Rheumatoid disorders,    Abdominal   Peds negative pediatric ROS (+)  Hematology negative hematology ROS (+)   Anesthesia Other Findings   Reproductive/Obstetrics negative OB ROS                         Anesthesia Physical  Anesthesia Plan  ASA: III  Anesthesia Plan: General   Post-op Pain Management:    Induction: Intravenous  Airway Management Planned: LMA  Additional Equipment:   Intra-op Plan:   Post-operative Plan: Extubation in OR  Informed Consent: I have reviewed the patients History and Physical, chart, labs and discussed the procedure including the risks, benefits and alternatives for the proposed anesthesia with the patient or authorized representative who has indicated his/her understanding and acceptance.   Dental advisory given  Plan Discussed with: CRNA  Anesthesia Plan Comments:         Anesthesia Quick Evaluation

## 2012-09-12 NOTE — Anesthesia Postprocedure Evaluation (Signed)
Anesthesia Post Note  Patient: [name]  Procedure(s) Performed: Procedure(s) (LRB): REVISION RIGHT LEG WOUND AND PRIMARY CLOSURE  (Right)  Anesthesia type: General  Patient location: PACU  Post pain: Pain level controlled  Post assessment: Post-op Vital signs reviewed  Last Vitals: BP 93/55  Pulse 71  Temp(Src) 37.2 C  Resp 16  Ht 5\' 11"  (1.803 m)  Wt 165 lb (74.844 kg)  BMI 23.02 kg/m2  SpO2 96%  Post vital signs: Reviewed  Level of consciousness: sedated  Complications: No apparent anesthesia complications

## 2012-09-12 NOTE — H&P (Signed)
[name] is an 67 y.o. female.    Chief Complaint:  Non-healing right leg wound   HPI: Pt is a 67 y.o. female complaining of persistently open wound right lower extremity after above knee amputation.  No significant pain at wound site.  Persistent drainage but no significant purulence.  PCP:  Marylen Ponto, MD  D/C Plans: To be determined following appropriate treatment plan, probably to home  PMH: Past Medical History  Diagnosis Date  . Emphysema   . Atrial fibrillation   . COPD (chronic obstructive pulmonary disease)   . History of atrial fibrillation     S/pP Ablation  . Asthma   . Arthritis   . Osteoarthritis   . Rheumatoid arthritis(714.0)   . Anemia   . B12 deficiency anemia   . Stroke     slight stroke in 2009-no deficits  . Neuromuscular disorder     hx of right quadricep rupture  . DJD (degenerative joint disease)   . History of benign bladder tumor   . DVT (deep venous thrombosis)   . Blood transfusion     2009   . Pneumonia     hx of  . GERD (gastroesophageal reflux disease)   . CHF (congestive heart failure)   . Tobacco abuse     PSH: Past Surgical History  Procedure Laterality Date  . Replacement total knee    . Cardiac electrophysiology mapping and ablation    . Cholecystectomy  1996  . Cataract extraction, bilateral    . Hematoma evacuation  2009    From right groin  . Joint replacement      Left-1996, Right-1995  . Multiple tooth extractions  06/30/2010  . Trigger finger release  2011  . Amputation  12/17/2010    Procedure: AMPUTATION ABOVE KNEE;  Surgeon: [name];  Location: [facility];  Service: Orthopedics;  Laterality: Right;  . Application of wound vac  12/17/2010    Procedure: APPLICATION OF WOUND VAC;  Surgeon: [name];  Location: [facility];  Service: Orthopedics;  Laterality: Right;  wound Vac applied @ 2045 By Luna Fuse  . Amputation  04/27/2011    Procedure: AMPUTATION ABOVE KNEE;  Surgeon: [name], MD;   Location: [facility];  Service: Orthopedics;  Laterality: Right;  Revision Right above knee amputation.  . Cardiac catheterization    . Cardioversion      x5 times  . Amputation  02/28/2012    Procedure: AMPUTATION BELOW KNEE;  Surgeon: [name], MD;  Location: [facility];  Service: Orthopedics;  Laterality: Right;  SCAR REVISION  . Tenosynovectomy      left long and ring fingers on 04/19/12  . Amputation Right 06/06/2012    Procedure: REVISION ABOVE RIGHT KNEE AMPUTATION, STUMP ;  Surgeon: [name], MD;  Location: [facility];  Service: Orthopedics;  Laterality: Right;  . I&d extremity Right 06/27/2012    Procedure: IRRIGATION AND DEBRIDEMENT hematoma right AKA;  Surgeon: [name], MD;  Location: [facility];  Service: Orthopedics;  Laterality: Right;    Social History:  reports that she has quit smoking. She has never used smokeless tobacco. She reports that she does not drink alcohol or use illicit drugs.  Allergies:  Allergies  Allergen Reactions  . Sulfa Antibiotics Anaphylaxis and Shortness Of Breath  . Adhesive (Tape) Other (See Comments)    Satiny and adhesive tape - blisters skin  . Avelox (Moxifloxacin Hcl In Nacl) Diarrhea and Nausea And Vomiting  .  Tizanidine     UTI  . Penicillins Hives and Rash    Medications: Medications Prior to Admission  Medication Sig Dispense Refill  . acetaminophen (TYLENOL) 500 MG tablet Take 1,000 mg by mouth every 6 (six) hours as needed for pain.      . Albuterol Sulfate (PROAIR HFA IN) Inhale 2 puffs into the lungs every 6 (six) hours as needed (For shortness of breath.).       Marland Kitchen amiodarone (PACERONE) 200 MG tablet Take 200 mg by mouth every evening.       Marland Kitchen ascorbic acid (VITAMIN C) 1000 MG tablet Take 1,000 mg by mouth at bedtime.      . celecoxib (CELEBREX) 200 MG capsule Take 200 mg by mouth every evening.       . cephALEXin (KEFLEX) 500 MG capsule Take 500 mg by mouth 4 (four) times daily. Patient stated that she is on her 3rd two week  cycle. Her pharmacy filled this for her 08/23/12 for 56 capsules. She has not completed this regimen.      Marland Kitchen CRANBERRY PO Take 2 tablets by mouth at bedtime.      . dabigatran (PRADAXA) 150 MG CAPS Take 150 mg by mouth every 12 (twelve) hours.      Marland Kitchen dimenhyDRINATE (DRAMAMINE) 50 MG tablet Take 50 mg by mouth at bedtime as needed (sleep).      . diphenhydrAMINE (BENADRYL) 25 mg capsule Take 50 mg by mouth at bedtime as needed for itching or sleep.      Marland Kitchen Fluticasone Furoate-Vilanterol (BREO ELLIPTA) 100-25 MCG/INH AEPB Inhale 1 puff into the lungs at bedtime.      Marland Kitchen HYDROcodone-acetaminophen (NORCO) 7.5-325 MG per tablet Take 1-2 tablets by mouth every 4 (four) hours as needed for pain.  100 tablet  0  . hydroxypropyl methylcellulose (ISOPTO TEARS) 2.5 % ophthalmic solution Place 1 drop into both eyes daily as needed (For dry eyes or allergies.). As needed      . methocarbamol (ROBAXIN) 500 MG tablet Take 500 mg by mouth 2 (two) times daily.       Marland Kitchen nystatin (MYCOSTATIN/NYSTOP) 100000 UNIT/GM POWD Apply 1 Bottle topically 2 (two) times daily as needed (For heat rash under breasts and in groin area.).       Marland Kitchen omeprazole (PRILOSEC) 20 MG capsule Take 20 mg by mouth at bedtime.      . pregabalin (LYRICA) 50 MG capsule Take 50 mg by mouth 2 (two) times daily as needed (For pain.).      Marland Kitchen pyridoxine (B-6) 100 MG tablet Take 100 mg by mouth every evening.       . traZODone (DESYREL) 50 MG tablet Take 50 mg by mouth at bedtime.        Results for orders placed during the hospital encounter of 09/12/12 (from the past 48 hour(s))  CBC     Status: Abnormal   Collection Time    09/12/12  1:20 PM      Result Value Range   WBC 5.1  4.0 - 10.5 K/uL   RBC 3.46 (*) 3.87 - 5.11 MIL/uL   Hemoglobin 12.7  12.0 - 15.0 g/dL   HCT 16.1  09.6 - 04.5 %   MCV 105.8 (*) 78.0 - 100.0 fL   MCH 36.7 (*) 26.0 - 34.0 pg   MCHC 34.7  30.0 - 36.0 g/dL   RDW 40.9  81.1 - 91.4 %   Platelets 219  150 - 400 K/uL  BASIC  METABOLIC PANEL     Status: None   Collection Time    09/12/12  1:20 PM      Result Value Range   Sodium 137  135 - 145 mEq/L   Potassium 3.8  3.5 - 5.1 mEq/L   Chloride 100  96 - 112 mEq/L   CO2 27  19 - 32 mEq/L   Glucose, Bld 89  70 - 99 mg/dL   BUN 9  6 - 23 mg/dL   Creatinine, Ser 0.27  0.50 - 1.10 mg/dL   Calcium 9.5  8.4 - 25.3 mg/dL   GFR calc non Af Amer >90  >90 mL/min   GFR calc Af Amer >90  >90 mL/min   Comment:            The eGFR has been calculated     using the CKD EPI equation.     This calculation has not been     validated in all clinical     situations.     eGFR's persistently     <90 mL/min signify     possible Chronic Kidney Disease.  PROTIME-INR     Status: None   Collection Time    09/12/12  1:20 PM      Result Value Range   Prothrombin Time 12.7  11.6 - 15.2 seconds   INR 0.97  0.00 - 1.49  APTT     Status: None   Collection Time    09/12/12  1:20 PM      Result Value Range   aPTT 29  24 - 37 seconds  TYPE AND SCREEN     Status: None   Collection Time    09/12/12  1:20 PM      Result Value Range   ABO/RH(D) A POS     Antibody Screen NEG     Sample Expiration 09/15/2012     No results found.  ROS: Review of Systems - Negative except that noted in presenting history General ROS: negative for - chills, fever or night sweats Psychological ROS: negative for - anxiety Respiratory ROS: no cough, shortness of breath, or wheezing Cardiovascular ROS: no chest pain or dyspnea on exertion Genito-Urinary ROS: no dysuria, trouble voiding, or hematuria Musculoskeletal ROS: positive for - persistently open wound right leg  Physican Exam: There were no vitals taken for this visit.  Awake alert, pleasant disposition Chest clear, no wheezing Heart regular rate Above nontender  Right leg with 5-7 mm opening that we have been packing that has not yet healed.  Known to have very thin skin from previous surgeries Poor vascular status bilateral lower  extremities  Assessment/Plan Assessment:  Non healing right leg wound, history of right above knee amputation   Plan: Patient will undergo a repeat excision and non excisional debridement of the right leg wound to attempt primary closure on this right leg.  Risks benefits and expectation were discussed with the patient. Patient understand risks, benefits and expectation and wishes to proceed.   Madlyn Frankel Charlann Boxer, MD  09/12/2012, 2:12 PM

## 2012-09-12 NOTE — Preoperative (Signed)
Beta Blockers   Reason not to administer Beta Blockers:Not Applicable 

## 2012-09-12 NOTE — Transfer of Care (Signed)
Immediate Anesthesia Transfer of Care Note  Patient: [name]  Procedure(s) Performed: Procedure(s): REVISION RIGHT LEG WOUND AND PRIMARY CLOSURE  (Right)  Patient Location: PACU  Anesthesia Type:General  Level of Consciousness: awake, alert  and oriented  Airway & Oxygen Therapy: Patient Spontanous Breathing and Patient connected to face mask oxygen  Post-op Assessment: Report given to PACU RN and Post -op Vital signs reviewed and stable  Post vital signs: Reviewed and stable  Complications: No apparent anesthesia complications

## 2012-09-12 NOTE — Progress Notes (Addendum)
NT made RN aware of BP 77/48. Called PA-C on call who determined this probably a result of late dose of Amiodarone. Will recheck BP in 2 hrs and give bolus NS if needed.  1223: BP 87/54. Will continue to monitor.

## 2012-09-12 NOTE — Brief Op Note (Signed)
09/12/2012  3:30 PM  PATIENT:  Launa Grill  67 y.o. female  PRE-OPERATIVE DIAGNOSIS:  NON HEALING WOUND RIGHT ABOVE KNEE AMPUTATION   POST-OPERATIVE DIAGNOSIS:  NON HEALING WOUND RIGHT ABOVE KNEE AMPUTATION   PROCEDURE:  Procedure(s): REVISION RIGHT LEG WOUND AND PRIMARY CLOSURE  (Right)  SURGEON:  Surgeon(s) and Role:    * [name], MD - Primary  PHYSICIAN ASSISTANT: No  ANESTHESIA:   general  EBL:  Total I/O In: -  Out: 75 [Blood:75]  BLOOD ADMINISTERED:none  DRAINS: none   LOCAL MEDICATIONS USED:  NONE  SPECIMEN:  No Specimen  DISPOSITION OF SPECIMEN:  N/A  COUNTS:  YES  TOURNIQUET:  * No tourniquets in log *  DICTATION: .Other Dictation: Dictation Number [id number]  PLAN OF CARE: Admit to inpatient   PATIENT DISPOSITION:  PACU - hemodynamically stable.   Delay start of Pharmacological VTE agent (>24hrs) due to surgical blood loss or risk of bleeding: no

## 2012-09-13 ENCOUNTER — Encounter (HOSPITAL_COMMUNITY): Payer: Self-pay | Admitting: Orthopedic Surgery

## 2012-09-13 LAB — BASIC METABOLIC PANEL
BUN: 8 mg/dL (ref 6–23)
Chloride: 103 mEq/L (ref 96–112)
Creatinine, Ser: 0.7 mg/dL (ref 0.50–1.10)
GFR calc non Af Amer: 88 mL/min — ABNORMAL LOW (ref >90)
Glucose, Bld: 87 mg/dL (ref 70–99)
Potassium: 4 mEq/L (ref 3.5–5.1)

## 2012-09-13 LAB — URINALYSIS, ROUTINE W REFLEX MICROSCOPIC
Bilirubin Urine: NEGATIVE
Hgb urine dipstick: NEGATIVE
Specific Gravity, Urine: 1.02 (ref 1.005–1.030)
pH: 5.5 (ref 5.0–8.0)

## 2012-09-13 LAB — CBC
HCT: 29.7 % — ABNORMAL LOW (ref 36.0–46.0)
Hemoglobin: 10 g/dL — ABNORMAL LOW (ref 12.0–15.0)
MCHC: 33.7 g/dL (ref 30.0–36.0)
MCV: 108 fL — ABNORMAL HIGH (ref 78.0–100.0)

## 2012-09-13 LAB — URINE MICROSCOPIC-ADD ON

## 2012-09-13 MED ORDER — SODIUM CHLORIDE 0.9 % IV BOLUS (SEPSIS)
500.0000 mL | Freq: Once | INTRAVENOUS | Status: AC
  Administered 2012-09-13: 500 mL via INTRAVENOUS

## 2012-09-13 MED ORDER — SODIUM CHLORIDE 0.9 % IV BOLUS (SEPSIS)
250.0000 mL | Freq: Once | INTRAVENOUS | Status: AC
  Administered 2012-09-13: 250 mL via INTRAVENOUS

## 2012-09-13 NOTE — Progress Notes (Signed)
[facility]  Patient Status: Active (receiving services up to time of hospitalization)  [facility] is providing the following services: RN  If patient discharges after hours, please call [phone].   Lanae Crumbly 09/13/2012, 12:11 PM

## 2012-09-13 NOTE — Progress Notes (Signed)
Occupational Therapy Treatment Patient Details Name: [name] MRN: [id number] DOB: 30-Aug-1945 Today's Date: 09/13/2012 Time: 2952-8413 OT Time Calculation (min): 21 min  OT Assessment / Plan / Recommendation  History of present illness Pt admitted 09/12/12 from  nonhealing wound of  AKA incision, s/p Iand D .   OT comments  Pt seen this pm to address bathroom mobility with use of tub bench. Pt with improved BP. See vitals. Continue to recommend HHOT at D/C. Sister present and agrees with plan.  Follow Up Recommendations  Home health OT    Barriers to Discharge  Decreased caregiver support    Equipment Recommendations  Tub/shower bench    Recommendations for Other Services    Frequency Min 2X/week   Progress towards OT Goals Progress towards OT goals: Progressing toward goals  Plan Discharge plan remains appropriate    Precautions / Restrictions Precautions Precaution Comments: monitor BP Restrictions Weight Bearing Restrictions: No   Pertinent Vitals/Pain BP supine 85.62 sitting 96/57 standing 115/65    ADL  ADL Comments: focus of session of functional mobility with tub shower concern at D/c. Pt will benefit from use of tubench. Pt asking quextions about w/c lift equipment. Gave pt resource information.    OT Diagnosis: Generalized weakness;Acute pain  OT Problem List: Decreased activity tolerance;Decreased knowledge of use of DME or AE;Pain OT Treatment Interventions: Self-care/ADL training;Energy conservation;DME and/or AE instruction;Therapeutic activities;Patient/family education   OT Goals(current goals can now be found in the care plan section) Acute Rehab OT Goals Patient Stated Goal: to be independent OT Goal Formulation: With patient Time For Goal Achievement: 09/27/12 Potential to Achieve Goals: Good ADL Goals Pt Will Transfer to Toilet: with modified independence;bedside commode Pt Will Perform Toileting - Clothing Manipulation and hygiene: with  modified independence;sitting/lateral leans Pt Will Perform Tub/Shower Transfer: with supervision;Squat pivot transfer;grab bars;tub bench  Visit Information  Last OT Received On: 09/13/12 Assistance Needed: +1 History of Present Illness: Pt admitted 09/12/12 from  nonhealing wound of  AKA incision, s/p Iand D .    Subjective Data      Prior Functioning  Home Living Family/patient expects to be discharged to:: Private residence Living Arrangements: Alone Available Help at Discharge: Neighbor;Available PRN/intermittently Type of Home: Apartment Home Access: Ramped entrance Home Layout: One level Home Equipment: Wheelchair - power;Bedside commode;Walker - 2 wheels;Wheelchair - manual Additional Comments: only able to get w/c to toilet - unable to reach shower Prior Function Level of Independence: Independent with assistive device(s) Comments: Pt wants to investigate PCS through Medicaid. Communication Communication: No difficulties Dominant Hand: Right    Cognition  Cognition Arousal/Alertness: Awake/alert Behavior During Therapy: WFL for tasks assessed/performed Overall Cognitive Status: Within Functional Limits for tasks assessed    Mobility  Bed Mobility Bed Mobility: Supine to Sit;Sit to Supine Supine to Sit: 7: Independent Sit to Supine: 7: Independent Transfers Transfers: Sit to Stand;Stand to Sit Sit to Stand: 5: Supervision Stand to Sit: 5: Supervision Details for Transfer Assistance: Pt's BP 84/48 sitting , pt does not feel dizzy but "detatched"    Exercises  Other Exercises Other Exercises: encouraged BUE AROM Other Exercises: encouraged incentive spirometer   Balance     End of Session OT - End of Session Equipment Utilized During Treatment: Gait belt;Other (comment);Oxygen (w/c) Activity Tolerance: Patient tolerated treatment well Patient left: in bed;with call bell/phone within reach;with family/visitor present Nurse Communication: Mobility  status;Other (comment) (BP status)  GO     [name],[name] 09/13/2012, 5:15 PM [name], OTR/L  [phone] 09/13/2012 

## 2012-09-13 NOTE — Progress Notes (Signed)
1500 patients b/p 84/48 pt alert oriented, asymptomatic matt babish pa paged with orders received. Sharrell Ku RN

## 2012-09-13 NOTE — Evaluation (Signed)
Physical Therapy Evaluation Patient Details Name: [name] MRN: [id number] DOB: October 08, 1945 Today's Date: 09/13/2012 Time: 4098-1191 PT Time Calculation (min): 40 min  PT Assessment / Plan / Recommendation History of Present Illness  Pt admitted 09/12/12 from  nonhealing wound of  AKA incision, s/p Iand D .  Clinical Impression  Pt's BP manually taken, after sitting 80/44, sat x 10 minutes and 84/48. HR 56 . Pt did not want to attempt standing but has been able to pivot to [facility]. Pt will benefit from PT while in acute care. Will monitor BP next visit and check while standing/pivoting. Pt plans to return home.     PT Assessment  Patient needs continued PT services    Follow Up Recommendations  Home health PT    Does the patient have the potential to tolerate intense rehabilitation      Barriers to Discharge        Equipment Recommendations  None recommended by PT    Recommendations for Other Services     Frequency Min 6X/week    Precautions / Restrictions Precautions Precaution Comments: monitor BP   Pertinent Vitals/Pain No pain.  BP low 80/44, sitting max 84/48 RN aware.      Mobility  Bed Mobility Bed Mobility: Supine to Sit;Sit to Supine Supine to Sit: 7: Independent Sit to Supine: 7: Independent Transfers Transfers: Not assessed Details for Transfer Assistance: Pt's BP 84/48 sitting , pt does not feel dizzy but "detatched" Ambulation/Gait Ambulation/Gait Assistance: Not tested (comment)    Exercises     PT Diagnosis: Generalized weakness  PT Problem List: Decreased activity tolerance;Decreased mobility;Cardiopulmonary status limiting activity PT Treatment Interventions: Functional mobility training;Therapeutic activities;Patient/family education     PT Goals(Current goals can be found in the care plan section) Acute Rehab PT Goals Patient Stated Goal: To get back home. PT Goal Formulation: With patient Time For Goal Achievement:  09/27/12 Potential to Achieve Goals: Good  Visit Information  Last PT Received On: 09/13/12 Assistance Needed: +1 History of Present Illness: Pt admitted 09/12/12 from  nonhealing wound of  AKA incision, s/p Iand D .       Prior Functioning  Home Living Family/patient expects to be discharged to:: Private residence Living Arrangements: Alone Available Help at Discharge: Neighbor;Available PRN/intermittently Type of Home: Apartment Home Access: Ramped entrance Home Layout: One level Home Equipment: Wheelchair - power;Bedside commode;Walker - 2 wheels;Wheelchair - manual Prior Function Level of Independence: Independent with assistive device(s) Communication Communication: No difficulties    Cognition  Cognition Arousal/Alertness: Awake/alert Behavior During Therapy: WFL for tasks assessed/performed Overall Cognitive Status: Within Functional Limits for tasks assessed    Extremity/Trunk Assessment Upper Extremity Assessment Upper Extremity Assessment: Overall WFL for tasks assessed Lower Extremity Assessment Lower Extremity Assessment: Generalized weakness   Balance    End of Session PT - End of Session Activity Tolerance: Patient limited by fatigue Patient left: in bed;with call bell/phone within reach Nurse Communication: Mobility status (low BP)  GP     Rada Hay 09/13/2012, 2:34 PM

## 2012-09-13 NOTE — Op Note (Signed)
NAMEKEISA, [name]          ACCOUNT NO.:  [id number]  MEDICAL RECORD NO.:  [id number]  LOCATION:  1602                         FACILITY:  [facility]  PHYSICIAN:  Madlyn Frankel. Charlann Boxer, M.D.  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  FINDINGS:  The patient was noted to have nonhealing wound area with serous type drainage.  No active purulence or infection in the skin area or in the subcutaneous deep tissues.  PROCEDURE: 1. Excisional debridement of right above-the-knee amputation wound     sharply with a knife of approximately 7 cm in length including skin     and subcutaneous tissue. 2. Non-excisional debridement with 1 L of irrigation normal saline     pulse lavage. 3. Primary wound closure utilizing 2-0 nylon.  SURGEON:  Madlyn Frankel. Charlann Boxer, MD  ASSISTANT:  Surgical team.  ANESTHESIA:  General LMA.  SPECIMENS:  None.  COMPLICATIONS:  None.  DRAINS:  None.  INDICATIONS FOR PROCEDURE:  [name] is a 67 year old female, well known to me from her involvement with her right knee, complicated by infection and emergent above-the-knee amputation.  She at this point has undergone several attempts at revision amputations and effort to maximize pain relief, but has been complicated by wound healing issues. Her vascular status of her skin has been poor and I think contributing to some of the issues regarding her wound healing.  Nonetheless, she had an 8-mm open that was persistently draining and was not healing with conservative treatment to the office with packing daily, they tried to give secondary wound  healing.  Given the persistent drainage, she wished at this point to proceed with more definitive measures.  We discussed the risks and benefits of the planned procedure, and she was eager to proceed, understanding risks of persistent wound issues or infection. Consent was obtained.  PROCEDURE IN DETAIL:  The patient was brought to operative theater. Once adequate anesthesia, preoperative antibiotics, clindamycin, she was positioned supine.  The right lower extremity was then pre-draped and prepped and draped in sterile fashion.  A time-out was performed identifying the patient, planned procedure, and extremity.  At this point, I identified this area on the distal medial aspect of her incision that was not healing, excised this area sharply with a scalpel again of about 7 cm in elliptical fashion trying to get down to more healthy skin on the posterior aspect of her wound.  I then elevated the soft tissues then debrided underlying subcutaneous tissue sharply with a scalpel again.  Any significant hemostasis was obtained using Bovie cautery.  I then irrigated the wound with 1 L normal saline solution further debriding this area.  At this point, I was able to reapproximate the skin edges first using 2-0 Vicryl in the subcu layer in an interrupted fashion, then using 2-0 nylon in interrupted horizontal mattress sutures to invert skin edges.  Her wound edges appeared to be  relatively healthy at this point with inverted skin edges appeared to be healing up nicely. Once the skin was fully close, the incision was cleaned, dried, and dressed sterilely using a Mepilex due to a sensitivity to adhesives and an Ace wrap.  She was brought to recovery room, extubated in stable condition, tolerating the procedure well.  She will be in the hospital 1- 2 days, will be discharged home.  We will closely monitor wound healing in the office over the next month to 6 weeks.     Madlyn Frankel Charlann Boxer,  M.D.     MDO/MEDQ  D:  09/12/2012  T:  09/13/2012  Job:  [id number]

## 2012-09-13 NOTE — Progress Notes (Signed)
Occupational Therapy Evaluation Patient Details Name: KARLEA MCKIBBIN MRN: [id number] DOB: [birth year] Today's Date: 09/13/2012 Time: 4098-1191 OT Time Calculation (min): 16 min  OT Assessment / Plan / Recommendation History of present illness Pt admitted 09/12/12 from  nonhealing wound of  AKA incision, s/p Iand D .   Clinical Impression   PTA, pt lived alone in "senior apt"area. Pt was mod I with ADL and mobility. Pt with minimal funcitonal decline s/p surgery. Pt will benefit from skilled OT services to home with HHOT to next venue due to below deficits.    OT Assessment  Patient needs continued OT Services    Follow Up Recommendations  Home health OT    Barriers to Discharge Decreased caregiver support    Equipment Recommendations  Tub/shower bench    Recommendations for Other Services    Frequency  Min 2X/week    Precautions / Restrictions Precautions Precaution Comments: monitor BP Restrictions Weight Bearing Restrictions: No   Pertinent Vitals/Pain no apparent distress     ADL  ADL Comments: overall set up with ADL and S with mobility due to low BP    OT Diagnosis: Generalized weakness;Acute pain  OT Problem List: Decreased activity tolerance;Decreased knowledge of use of DME or AE;Pain OT Treatment Interventions: Self-care/ADL training;Energy conservation;DME and/or AE instruction;Therapeutic activities;Patient/family education   OT Goals(Current goals can be found in the care plan section) Acute Rehab OT Goals Patient Stated Goal: to be independent OT Goal Formulation: With patient Time For Goal Achievement: 09/27/12 Potential to Achieve Goals: Good  Visit Information  Last OT Received On: 09/13/12 Assistance Needed: +1 History of Present Illness: Pt admitted 09/12/12 from  nonhealing wound of  AKA incision, s/p Iand D .       Prior Functioning     Home Living Family/patient expects to be discharged to:: Private residence Living Arrangements:  Alone Available Help at Discharge: Neighbor;Available PRN/intermittently Type of Home: Apartment Home Access: Ramped entrance Home Layout: One level Home Equipment: Wheelchair - power;Bedside commode;Walker - 2 wheels;Wheelchair - manual Additional Comments: only able to get w/c to toilet - unable to reach shower Prior Function Level of Independence: Independent with assistive device(s) Comments: Pt wants to investigate PCS through Medicaid. Communication Communication: No difficulties Dominant Hand: Right         Vision/Perception Vision - History Baseline Vision: No visual deficits   Cognition  Cognition Arousal/Alertness: Awake/alert Behavior During Therapy: WFL for tasks assessed/performed Overall Cognitive Status: Within Functional Limits for tasks assessed    Extremity/Trunk Assessment Upper Extremity Assessment Upper Extremity Assessment: Overall WFL for tasks assessed Lower Extremity Assessment Lower Extremity Assessment: RLE deficits/detail Cervical / Trunk Assessment Cervical / Trunk Assessment: Normal     Mobility Bed Mobility Bed Mobility: Supine to Sit;Sit to Supine Supine to Sit: 7: Independent Sit to Supine: 7: Independent Transfers Transfers: Sit to Stand;Stand to Sit Sit to Stand: 5: Supervision Stand to Sit: 5: Supervision Details for Transfer Assistance: Pt's BP 84/48 sitting , pt does not feel dizzy but "detatched"     Exercise     Balance  WFL for ADL   End of Session OT - End of Session Equipment Utilized During Treatment: Gait belt Activity Tolerance: Patient tolerated treatment well Patient left: in bed;with call bell/phone within reach;with family/visitor present Nurse Communication: Mobility status  GO     Lorilee Cafarella,[name] 09/13/2012, 4:38 PM [facility], OTR/L  [phone] 09/13/2012

## 2012-09-13 NOTE — Progress Notes (Signed)
   Subjective: 1 Day Post-Op Procedure(s) (LRB): REVISION RIGHT LEG WOUND AND PRIMARY CLOSURE  (Right)   Patient reports pain as mild, pain not well controlled. She is not her typical pleasant self.  Had a few incidents on increased pain throughout the night.   Objective:   VITALS:   Filed Vitals:   09/13/12 0518  BP: 83/46  Pulse: 61  Temp: 98 F (36.7 C)  Resp: 17    Incision: dressing C/D/I No cellulitis present Compartment soft  LABS  Recent Labs  09/12/12 1320 09/13/12 0430  HGB 12.7 10.0*  HCT 36.6 29.7*  WBC 5.1 5.1  PLT 219 184     Recent Labs  09/12/12 1320 09/13/12 0430  NA 137 136  K 3.8 4.0  BUN 9 8  CREATININE 0.64 0.70  GLUCOSE 89 87     Assessment/Plan: 1 Day Post-Op Procedure(s) (LRB): REVISION RIGHT LEG WOUND AND PRIMARY CLOSURE  (Right)  Up with therapy Discharge home with home health tomorrow if pain is under control.   Anastasio Auerbach Aundrea Higginbotham   PAC  09/13/2012, 9:42 AM

## 2012-09-14 LAB — BASIC METABOLIC PANEL
BUN: 6 mg/dL (ref 6–23)
CO2: 28 mEq/L (ref 19–32)
Calcium: 8.1 mg/dL — ABNORMAL LOW (ref 8.4–10.5)
Chloride: 106 mEq/L (ref 96–112)
Creatinine, Ser: 0.66 mg/dL (ref 0.50–1.10)
Glucose, Bld: 105 mg/dL — ABNORMAL HIGH (ref 70–99)

## 2012-09-14 LAB — CBC
HCT: 30.1 % — ABNORMAL LOW (ref 36.0–46.0)
Hemoglobin: 9.9 g/dL — ABNORMAL LOW (ref 12.0–15.0)
MCH: 35.9 pg — ABNORMAL HIGH (ref 26.0–34.0)
MCV: 109.1 fL — ABNORMAL HIGH (ref 78.0–100.0)
Platelets: 177 10*3/uL (ref 150–400)
RBC: 2.76 MIL/uL — ABNORMAL LOW (ref 3.87–5.11)
WBC: 5.3 10*3/uL (ref 4.0–10.5)

## 2012-09-14 MED ORDER — METHOCARBAMOL 500 MG PO TABS: 500.0000 mg | ORAL_TABLET | Freq: Four times a day (QID) | ORAL | Status: DC | PRN

## 2012-09-14 MED ORDER — DSS 100 MG PO CAPS: 100.0000 mg | ORAL_CAPSULE | Freq: Two times a day (BID) | ORAL | Status: DC

## 2012-09-14 MED ORDER — HYDROCODONE-ACETAMINOPHEN 7.5-325 MG PO TABS: 1.0000 | ORAL_TABLET | ORAL | Status: DC | PRN

## 2012-09-14 MED ORDER — CEPHALEXIN 500 MG PO CAPS: 500.0000 mg | ORAL_CAPSULE | Freq: Four times a day (QID) | ORAL | Status: DC

## 2012-09-14 MED ORDER — ASPIRIN 325 MG PO TBEC: 325.0000 mg | DELAYED_RELEASE_TABLET | Freq: Every day | ORAL | Status: DC

## 2012-09-14 MED ORDER — POLYETHYLENE GLYCOL 3350 17 G PO PACK: 17.0000 g | PACK | Freq: Two times a day (BID) | ORAL | Status: DC

## 2012-09-14 MED ORDER — FERROUS SULFATE 325 (65 FE) MG PO TABS: 325.0000 mg | ORAL_TABLET | Freq: Three times a day (TID) | ORAL | Status: DC

## 2012-09-14 NOTE — Progress Notes (Signed)
Occupational Therapy Treatment Patient Details Name: LINDA GRIMMER MRN: [id number] DOB: [birth year] Today's Date: 09/14/2012 Time: 4098-1191 OT Time Calculation (min): 33 min  OT Assessment / Plan / Recommendation  History of present illness Pt admitted 09/12/12 from  nonhealing wound of  AKA incision, s/p Iand D .   OT comments  Pt progressing well and did well with tub transfer with bench today. Needed min assist and pt states she has a caregiver that can help with all transfers at home. Dressing fell off during transfer to bench so nursing notified and came to room and redressed.   Follow Up Recommendations  Home health OT    Barriers to Discharge       Equipment Recommendations  Tub/shower bench    Recommendations for Other Services    Frequency Min 2X/week   Progress towards OT Goals Progress towards OT goals: Progressing toward goals  Plan Discharge plan remains appropriate    Precautions / Restrictions Precautions Precaution Comments: monitor BP Restrictions Weight Bearing Restrictions: No   Pertinent Vitals/Pain 8/10; reposition, pt states she will let nursing know. Nursing came to room.    ADL  Tub/Shower Transfer: Performed;Minimal assistance Tub/Shower Transfer Method: Stand pivot Tub/Shower Transfer Equipment: Transfer tub bench ADL Comments: Practiced transfer onto tub transfer bench from EOB via a stand pivot transfer. Pt then practiced leaning from side to side to simulate washing posterior periarea. Pt then transferred back to bed. Pt states she will have a caregiver ther whenever she gets up for awhile. Discussed how to adjust the back on the tubbench to face the shower head. She already owns a 3in1. Pt needed min assist to help rise to standing today.    OT Diagnosis:    OT Problem List:   OT Treatment Interventions:     OT Goals(current goals can now be found in the care plan section)    Visit Information  Last OT Received On: 09/14/12 Assistance  Needed: +1 History of Present Illness: Pt admitted 09/12/12 from  nonhealing wound of  AKA incision, s/p Iand D .    Subjective Data      Prior Functioning       Cognition  Cognition Arousal/Alertness: Awake/alert Behavior During Therapy: WFL for tasks assessed/performed Overall Cognitive Status: Within Functional Limits for tasks assessed    Mobility  Bed Mobility Bed Mobility: Supine to Sit;Sit to Supine Supine to Sit: 7: Independent Sit to Supine: 7: Independent Transfers Transfers: Sit to Stand;Stand to Sit Sit to Stand: 4: Min assist;With upper extremity assist;From bed;Other (comment) (tubbench) Stand to Sit: 4: Min assist;With upper extremity assist;Other (comment) Mary Sella)    Exercises      Balance     End of Session OT - End of Session Activity Tolerance: Patient tolerated treatment well Patient left: in bed;with call bell/phone within reach;with family/visitor present  GO     Lennox Laity [phone] 09/14/2012, 11:16 AM

## 2012-09-14 NOTE — Progress Notes (Signed)
[facility]  [facility] is providing the following services: Tub Transfer Bench  If patient discharges after hours, please call [phone].   Renard Hamper [phone] 09/14/2012, 11:05 AM

## 2012-09-14 NOTE — Care Management Note (Signed)
    Page 1 of 2   09/14/2012     4:29:35 PM   CARE MANAGEMENT NOTE 09/14/2012  Patient:  [name], [name]   Account Number:  [id number]  Date Initiated:  09/14/2012  Documentation initiated by:  Colleen Can  Subjective/Objective Assessment:   dx non healing wound right BKA; revision of wound     Action/Plan:   CM spoke with patient. Plans are for her to return to her home in [place] where she has caregiver.She is already active with [facility]. Tub./transfr bench del to room.   Anticipated DC Date:  09/14/2012   Anticipated DC Plan:  HOME W HOME HEALTH SERVICES      DC Planning Services  CM consult      [facility] Choice  HOME HEALTH  DURABLE MEDICAL EQUIPMENT   Choice offered to / List presented to:  C-1 Patient   DME arranged  OTHER - SEE COMMENT      DME agency  [facility]     HH arranged  HH-2 PT  HH-3 OT  HH-1 RN      [facility] agency  [facility]   Status of service:  Completed, signed off Medicare Important Message given?  NA - LOS <3 / Initial given by admissions (If response is "NO", the following Medicare IM given date fields will be blank) Date Medicare IM given:   Date Additional Medicare IM given:    Discharge Disposition:  HOME W HOME HEALTH SERVICES  Per UR Regulation:  Reviewed for med. necessity/level of care/duration of stay  If discussed at Long Length of Stay Meetings, dates discussed:    Comments:  09/14/2012 Colleen Can BSN RN CCM [phone] [facility] will provide [facility] services upon pt's discharge to home.

## 2012-09-14 NOTE — Progress Notes (Signed)
Physical Therapy Treatment Patient Details Name: NEELY KAMMERER MRN: [id number] DOB: 05-01-1945 Today's Date: 09/14/2012 Time: 4098-1191 PT Time Calculation (min): 34 min  PT Assessment / Plan / Recommendation  History of Present Illness Pt admitted 09/12/12 from  nonhealing wound of  AKA incision, s/p Iand D .   PT Comments   Pt with no dizziness upon standing/transfer. Pt feels she can manage  At home. Has all DME.  States she will go to OP at Neuro rehab.  Follow Up Recommendations  Outpatient PT (pt reports she will return to OP aot neuro rehab.)     Does the patient have the potential to tolerate intense rehabilitation     Barriers to Discharge        Equipment Recommendations  None recommended by PT    Recommendations for Other Services    Frequency Min 6X/week   Progress towards PT Goals Progress towards PT goals: Progressing toward goals  Plan Current plan remains appropriate    Precautions / Restrictions Precautions Precautions: Fall Precaution Comments: residual limb has sutures/dressing   Pertinent Vitals/Pain Residual limb on L 7 . RN notified.    Mobility  Bed Mobility Bed Mobility: Supine to Sit;Sit to Supine Supine to Sit: 7: Independent Sit to Supine: 7: Independent Transfers Transfers: Stand Pivot Transfers Sit to Stand: 4: Min assist;With upper extremity assist;From bed;Other (comment) Stand to Sit: 4: Min guard;4: Min assist Stand Pivot Transfers: 5: Supervision Details for Transfer Assistance: Pt did have some difficulty with first stand from bed , improved from tub bench. Pt holds onto bed rail for transfer back to bed.    Exercises Amputee Exercises Gluteal Sets: AROM;Left Hip Extension: AROM;Left;Supine Hip ABduction/ADduction: AROM;Left;Sidelying   PT Diagnosis:    PT Problem List:   PT Treatment Interventions:     PT Goals (current goals can now be found in the care plan section)    Visit Information  Last PT Received On:  09/14/12 Assistance Needed: +1 History of Present Illness: Pt admitted 09/12/12 from  nonhealing wound of  AKA incision, s/p Iand D .    Subjective Data      Cognition  Cognition Arousal/Alertness: Awake/alert Behavior During Therapy: WFL for tasks assessed/performed Overall Cognitive Status: Within Functional Limits for tasks assessed    Balance     End of Session PT - End of Session Activity Tolerance: Patient tolerated treatment well Patient left: in bed;with call bell/phone within reach Nurse Communication: Mobility status;Patient requests pain meds   GP     Rada Hay 09/14/2012, 2:03 PM

## 2012-09-14 NOTE — Progress Notes (Signed)
   Subjective: 2 Days Post-Op Procedure(s) (LRB): REVISION RIGHT LEG WOUND AND PRIMARY CLOSURE  (Right)   Patient reports pain as mild, pain controlled. No events throughout the night. Ready to be discharged home.  Objective:   VITALS:   Filed Vitals:   09/14/12 0448  BP: 94/52  Pulse: 61  Temp: 99.9 F (37.7 C)  Resp: 16    Incision: dressing C/D/I  No cellulitis present  Compartment soft   LABS  Recent Labs  09/12/12 1320 09/13/12 0430 09/14/12 0445  HGB 12.7 10.0* 9.9*  HCT 36.6 29.7* 30.1*  WBC 5.1 5.1 5.3  PLT 219 184 177     Recent Labs  09/12/12 1320 09/13/12 0430 09/14/12 0445  NA 137 136 137  K 3.8 4.0 4.1  BUN 9 8 6   CREATININE 0.64 0.70 0.66  GLUCOSE 89 87 105*     Assessment/Plan: 2 Days Post-Op Procedure(s) (LRB): REVISION RIGHT LEG WOUND AND PRIMARY CLOSURE  (Right) Up with therapy Discharge home with home health Follow up in 2 weeks at [facility]. Follow up with OLIN,Lenell Mcconnell D in 2 weeks.  Contact information:  [facility] [address] [place] [address] [phone]       Expected ABLA  Treated with iron and will observe      Anastasio Auerbach. Yer Olivencia   PAC  09/14/2012, 8:16 AM

## 2012-09-14 NOTE — Progress Notes (Signed)
Dr Charlann Boxer paged, patient uncomfortable with skin issues around stump incision, discharged cancelled. Sharrell Ku RN

## 2012-09-15 ENCOUNTER — Encounter (HOSPITAL_COMMUNITY): Payer: Self-pay | Admitting: Orthopedic Surgery

## 2012-09-15 NOTE — Progress Notes (Signed)
Pt to d/c home. AVS reviewed and "My Chart" discussed with pt. Pt capable of verbalizing medications and follow-up appointments. Remains hemodynamically stable. No signs and symptoms of distress. Educated pt to return to ER in the case of SOB, dizziness, or chest pain.  

## 2012-09-15 NOTE — Progress Notes (Signed)
Patient ID: [name], female   DOB: Jan 16, 1946, 67 y.o.   MRN: [id number] Subjective: 3 Days Post-Op Procedure(s) (LRB): REVISION RIGHT LEG WOUND AND PRIMARY CLOSURE  (Right)    Patient reports pain as mild.  Objective:   VITALS:   Filed Vitals:   09/15/12 0549  BP: 101/59  Pulse: 56  Temp: 97.7 F (36.5 C)  Resp: 18    Incision: no drainage but with blood blister antero-medial leg with some ecchymosis surrounding it   LABS  Recent Labs  09/12/12 1320 09/13/12 0430 09/14/12 0445  HGB 12.7 10.0* 9.9*  HCT 36.6 29.7* 30.1*  WBC 5.1 5.1 5.3  PLT 219 184 177     Recent Labs  09/12/12 1320 09/13/12 0430 09/14/12 0445  NA 137 136 137  K 3.8 4.0 4.1  BUN 9 8 6   CREATININE 0.64 0.70 0.66  GLUCOSE 89 87 105*     Recent Labs  09/12/12 1320  INR 0.97     Assessment/Plan: 3 Days Post-Op Procedure(s) (LRB): REVISION RIGHT LEG WOUND AND PRIMARY CLOSURE  (Right)   Discharge home with home health Nursing to dress right leg with mepilex dressing prior to d/c  O/w d/c to home this am

## 2012-09-19 NOTE — Discharge Summary (Signed)
Physician Discharge Summary  Patient ID: [name] MRN: [id number] DOB/AGE: 04-29-1945 67 y.o.  Admit date: 09/12/2012 Discharge date: 09/15/2012   Procedures:  Procedure(s) (LRB): REVISION RIGHT LEG WOUND AND PRIMARY CLOSURE  (Right)  Attending Physician:  Dr. Durene Romans   Admission Diagnoses:   Non-healing right leg wound   Discharge Diagnoses:  Principal Problem:   S/P right AKA scar debridement Active Problems:   Expected blood loss anemia  Past Medical History  Diagnosis Date  . Emphysema   . Atrial fibrillation   . COPD (chronic obstructive pulmonary disease)   . History of atrial fibrillation     S/pP Ablation  . Asthma   . Arthritis   . Osteoarthritis   . Rheumatoid arthritis(714.0)   . Anemia   . B12 deficiency anemia   . Stroke     slight stroke in 2009-no deficits  . Neuromuscular disorder     hx of right quadricep rupture  . DJD (degenerative joint disease)   . History of benign bladder tumor   . DVT (deep venous thrombosis)   . Blood transfusion     2009   . Pneumonia     hx of  . GERD (gastroesophageal reflux disease)   . CHF (congestive heart failure)   . Tobacco abuse     HPI: Pt is a 67 y.o. female complaining of persistently open wound right lower extremity after above knee amputation. No significant pain at wound site. Persistent drainage but no significant purulence.  PCP: Marylen Ponto, MD   Discharged Condition: good  Hospital Course:  Patient underwent the above stated procedure on 09/12/2012. Patient tolerated the procedure well and brought to the recovery room in good condition and subsequently to the floor.  POD #1 BP: 83/46 ; Pulse: 61 ; Temp: 98 F (36.7 C) ; Resp: 17  Pt's foley was removed, as well as the hemovac drain removed. IV was changed to a saline lock. Patient reports pain as mild, pain not well controlled. She is not her typical pleasant self. Had a few incidents on increased pain throughout the night.    Incision: dressing C/D/I, no cellulitis present and compartment soft.  LABS  Basename    HGB  10.0  HCT  29.7   POD #2  BP: 94/52 ; Pulse: 61 ; Temp: 99.9 F (37.7 C) ; Resp: 16  Patient reports pain as mild, pain controlled. No events throughout the night.  Incision: dressing C/D/I, no cellulitis present and compartment soft.  LABS  Basename    HGB  9.9  HCT  30.1    POD #3  BP: 101/59 ; Pulse: 56 ; Temp: 97.7 F (36.5 C) ; Resp: 18 Patient reports pain as mild, pain controlled. No events throughout the night. Ready to be discharged home. Incision: no drainage but with blood blister antero-medial leg with some ecchymosis surrounding it, no cellulitis present and compartment soft.  LABS   No new labs  Discharge Exam: General appearance: alert, cooperative and no distress Extremities: no edema, redness or tenderness in the calves or thighs and no ulcers, gangrene or trophic changes  Disposition:   Home-Health Care Svc with follow up in 2 weeks   Follow-up Information   Follow up with [name], MD. Schedule an appointment as soon as possible for a visit in 2 weeks.   Contact information:   [address] [place] [address] [phone]       Discharge Orders   Future  Orders Complete By Expires     Call MD / Call 911  As directed     Comments:      If you experience chest pain or shortness of breath, CALL 911 and be transported to the hospital emergency room.  If you develope a fever above 101 F, pus (white drainage) or increased drainage or redness at the wound, or calf pain, call your surgeon's office.    Constipation Prevention  As directed     Comments:      Drink plenty of fluids.  Prune juice may be helpful.  You may use a stool softener, such as Colace (over the counter) 100 mg twice a day.  Use MiraLax (over the counter) for constipation as needed.    Diet - low sodium heart healthy  As directed     Discharge instructions  As directed      Comments:      Daily dressing changes with 4x4 gauze and tape. Keep the area dry and clean until follow up. Follow up in 2 weeks at [facility]. Call with any questions or concerns.         Medication List         acetaminophen 500 MG tablet  Commonly known as:  TYLENOL  Take 1,000 mg by mouth every 6 (six) hours as needed for pain.     amiodarone 200 MG tablet  Commonly known as:  PACERONE  Take 200 mg by mouth every evening.     ascorbic acid 1000 MG tablet  Commonly known as:  VITAMIN C  Take 1,000 mg by mouth at bedtime.     aspirin 325 MG EC tablet  Take 1 tablet (325 mg total) by mouth daily.     BREO ELLIPTA 100-25 MCG/INH Aepb  Generic drug:  Fluticasone Furoate-Vilanterol  Inhale 1 puff into the lungs at bedtime.     celecoxib 200 MG capsule  Commonly known as:  CELEBREX  Take 200 mg by mouth every evening.     cephALEXin 500 MG capsule  Commonly known as:  KEFLEX  Take 1 capsule (500 mg total) by mouth 4 (four) times daily. Patient stated that she is on her 3rd two week cycle. Her pharmacy filled this for her 08/23/12 for 56 capsules. She has not completed this regimen.     CRANBERRY PO  Take 2 tablets by mouth at bedtime.     dabigatran 150 MG Caps capsule  Commonly known as:  PRADAXA  Take 150 mg by mouth every 12 (twelve) hours.     dimenhyDRINATE 50 MG tablet  Commonly known as:  DRAMAMINE  Take 50 mg by mouth at bedtime as needed (sleep).     diphenhydrAMINE 25 mg capsule  Commonly known as:  BENADRYL  Take 50 mg by mouth at bedtime as needed for itching or sleep.     DSS 100 MG Caps  Take 100 mg by mouth 2 (two) times daily.     ferrous sulfate 325 (65 FE) MG tablet  Take 1 tablet (325 mg total) by mouth 3 (three) times daily after meals.     HYDROcodone-acetaminophen 7.5-325 MG per tablet  Commonly known as:  NORCO  Take 1-2 tablets by mouth every 4 (four) hours as needed for pain.     hydroxypropyl methylcellulose 2.5 %  ophthalmic solution  Commonly known as:  ISOPTO TEARS  Place 1 drop into both eyes daily as needed (For dry eyes or allergies.). As needed  methocarbamol 500 MG tablet  Commonly known as:  ROBAXIN  Take 1 tablet (500 mg total) by mouth every 6 (six) hours as needed (muscle spasms).     nystatin 100000 UNIT/GM Powd  Apply 1 Bottle topically 2 (two) times daily as needed (For heat rash under breasts and in groin area.).     omeprazole 20 MG capsule  Commonly known as:  PRILOSEC  Take 20 mg by mouth at bedtime.     polyethylene glycol packet  Commonly known as:  MIRALAX / GLYCOLAX  Take 17 g by mouth 2 (two) times daily.     pregabalin 50 MG capsule  Commonly known as:  LYRICA  Take 50 mg by mouth 2 (two) times daily as needed (For pain.).     PROAIR HFA IN  Inhale 2 puffs into the lungs every 6 (six) hours as needed (For shortness of breath.).     pyridoxine 100 MG tablet  Commonly known as:  B-6  Take 100 mg by mouth every evening.     traZODone 50 MG tablet  Commonly known as:  DESYREL  Take 50 mg by mouth at bedtime.         Signed: Anastasio Auerbach. Jahmire Ruffins   PAC  09/19/2012, 4:50 PM

## 2012-10-10 IMAGING — CR DG CHEST 1V PORT
1 series · 1 of 1 positions shown · non-contrast
Comparison: Chest x-ray of 12/09/2010

CLINICAL DATA: Shortness of breath, cough, congestion, former
smoker

PORTABLE CHEST - 1 VIEW

[AP]
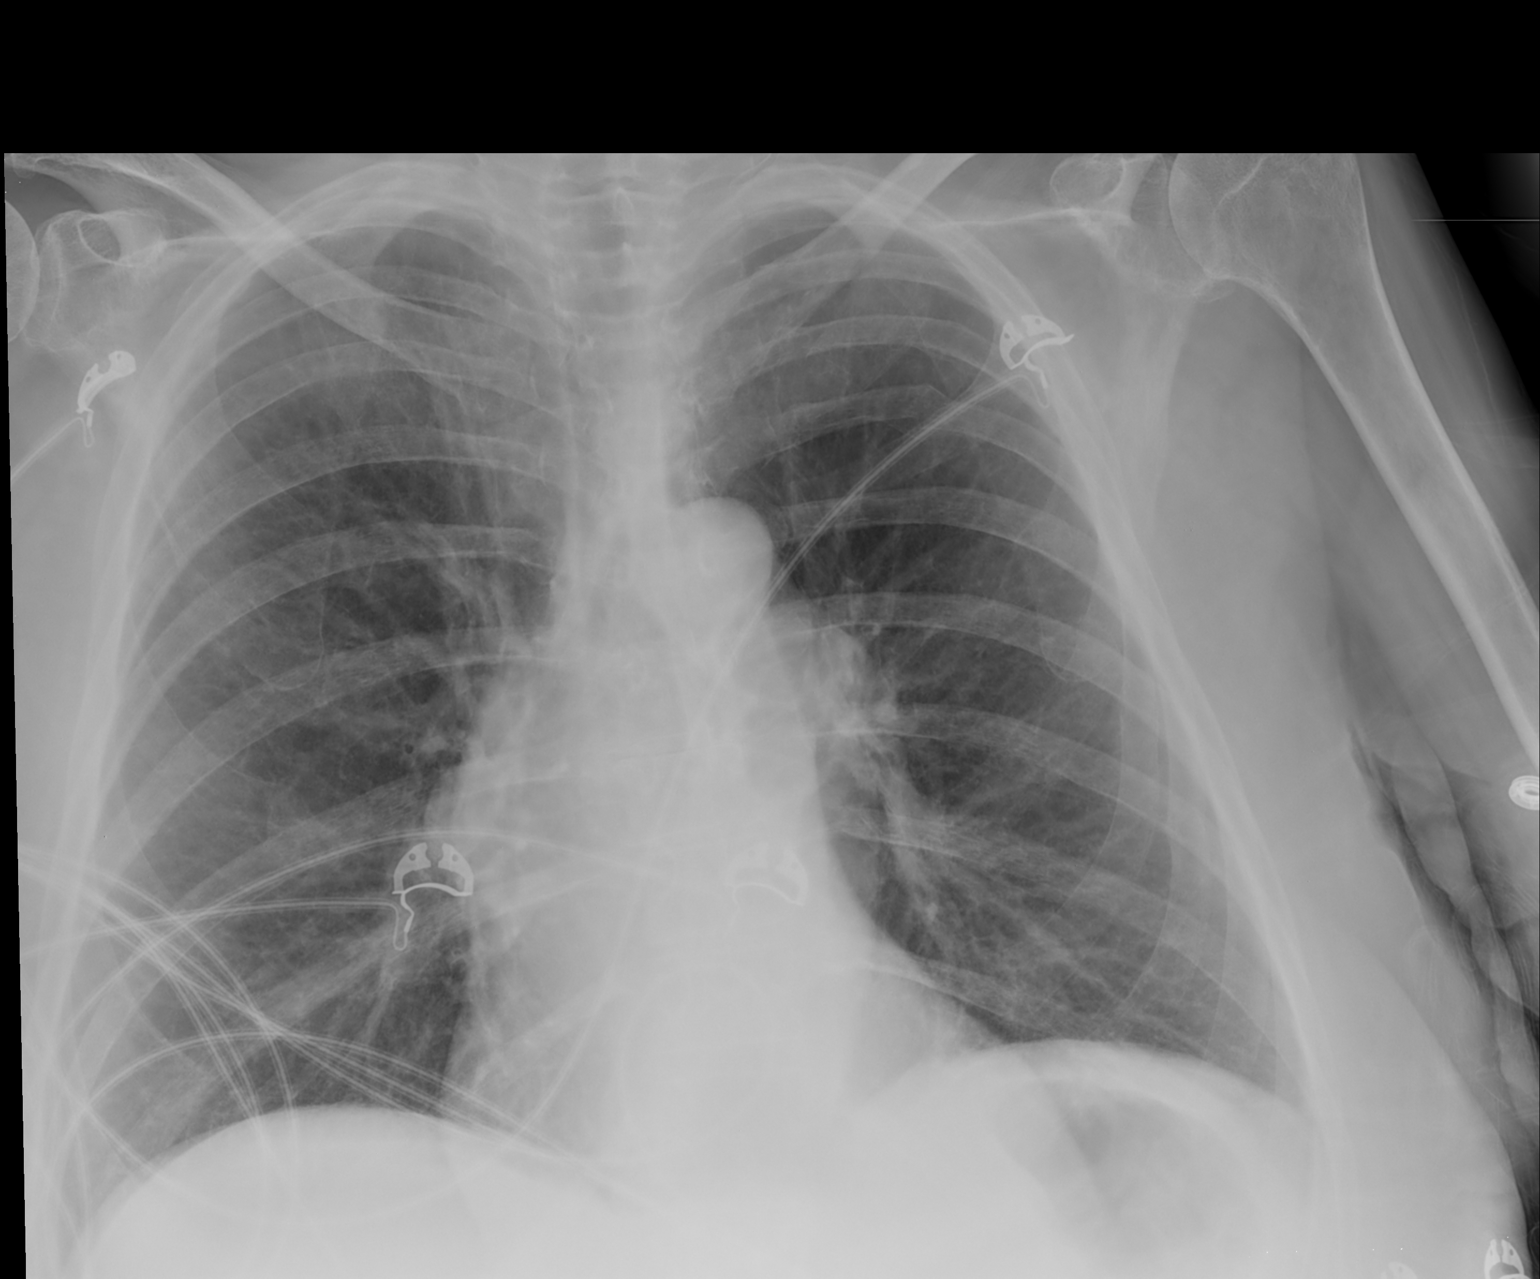

[1 of 1 positions shown; findings below may reference images not displayed]

FINDINGS: The lungs are clear and slightly hyperaerated.
Mediastinal contours are stable.  The heart is within upper limits
of normal in stable.  No bony abnormality is seen.
IMPRESSION: No active lung disease.  No change in hyperaeration.

## 2012-11-18 ENCOUNTER — Emergency Department (HOSPITAL_COMMUNITY)
Admission: EM | Admit: 2012-11-18 | Discharge: 2012-11-18 | Disposition: A | Discharge status: Home / Self Care | Payer: Medicare Other | Attending: Emergency Medicine | Admitting: Emergency Medicine

## 2012-11-18 ENCOUNTER — Encounter (HOSPITAL_COMMUNITY): Payer: Self-pay | Admitting: Emergency Medicine

## 2012-11-18 DIAGNOSIS — IMO0002 Reserved for concepts with insufficient information to code with codable children: Secondary | ICD-10-CM | POA: Insufficient documentation

## 2012-11-18 DIAGNOSIS — Z79899 Other long term (current) drug therapy: Secondary | ICD-10-CM | POA: Insufficient documentation

## 2012-11-18 DIAGNOSIS — J438 Other emphysema: Secondary | ICD-10-CM | POA: Insufficient documentation

## 2012-11-18 DIAGNOSIS — K219 Gastro-esophageal reflux disease without esophagitis: Secondary | ICD-10-CM | POA: Insufficient documentation

## 2012-11-18 DIAGNOSIS — R300 Dysuria: Secondary | ICD-10-CM | POA: Insufficient documentation

## 2012-11-18 DIAGNOSIS — M1990 Unspecified osteoarthritis, unspecified site: Secondary | ICD-10-CM | POA: Insufficient documentation

## 2012-11-18 DIAGNOSIS — Z87891 Personal history of nicotine dependence: Secondary | ICD-10-CM | POA: Insufficient documentation

## 2012-11-18 DIAGNOSIS — M069 Rheumatoid arthritis, unspecified: Secondary | ICD-10-CM | POA: Insufficient documentation

## 2012-11-18 DIAGNOSIS — J4489 Other specified chronic obstructive pulmonary disease: Secondary | ICD-10-CM | POA: Insufficient documentation

## 2012-11-18 DIAGNOSIS — I509 Heart failure, unspecified: Secondary | ICD-10-CM | POA: Insufficient documentation

## 2012-11-18 DIAGNOSIS — Z880 Allergy status to penicillin: Secondary | ICD-10-CM | POA: Insufficient documentation

## 2012-11-18 DIAGNOSIS — W01119A Fall on same level from slipping, tripping and stumbling with subsequent striking against unspecified sharp object, initial encounter: Secondary | ICD-10-CM | POA: Insufficient documentation

## 2012-11-18 DIAGNOSIS — Y9389 Activity, other specified: Secondary | ICD-10-CM | POA: Insufficient documentation

## 2012-11-18 DIAGNOSIS — W010XXA Fall on same level from slipping, tripping and stumbling without subsequent striking against object, initial encounter: Secondary | ICD-10-CM | POA: Insufficient documentation

## 2012-11-18 DIAGNOSIS — J449 Chronic obstructive pulmonary disease, unspecified: Secondary | ICD-10-CM | POA: Insufficient documentation

## 2012-11-18 DIAGNOSIS — N390 Urinary tract infection, site not specified: Secondary | ICD-10-CM

## 2012-11-18 DIAGNOSIS — S81812A Laceration without foreign body, left lower leg, initial encounter: Secondary | ICD-10-CM

## 2012-11-18 DIAGNOSIS — Y92009 Unspecified place in unspecified non-institutional (private) residence as the place of occurrence of the external cause: Secondary | ICD-10-CM | POA: Insufficient documentation

## 2012-11-18 LAB — URINALYSIS, ROUTINE W REFLEX MICROSCOPIC
Glucose, UA: NEGATIVE mg/dL
Ketones, ur: 15 mg/dL — AB
Protein, ur: NEGATIVE mg/dL
pH: 5 (ref 5.0–8.0)

## 2012-11-18 LAB — URINE MICROSCOPIC-ADD ON

## 2012-11-18 MED ORDER — CEPHALEXIN 500 MG PO CAPS
500.0000 mg | ORAL_CAPSULE | Freq: Once | ORAL | Status: AC
  Administered 2012-11-18: 500 mg via ORAL
  Filled 2012-11-18: qty 1

## 2012-11-18 MED ORDER — BACITRACIN 500 UNIT/GM EX OINT
1.0000 "application " | TOPICAL_OINTMENT | Freq: Two times a day (BID) | CUTANEOUS | Status: DC
  Filled 2012-11-18: qty 0.9

## 2012-11-18 MED ORDER — CEPHALEXIN 500 MG PO CAPS: 500.0000 mg | ORAL_CAPSULE | Freq: Four times a day (QID) | ORAL | Status: DC

## 2012-11-18 MED ORDER — NAPROXEN 500 MG PO TABS: 500.0000 mg | ORAL_TABLET | Freq: Two times a day (BID) | ORAL | Status: DC

## 2012-11-18 MED ORDER — LIDOCAINE HCL (PF) 1 % IJ SOLN
5.0000 mL | Freq: Once | INTRAMUSCULAR | Status: AC
  Administered 2012-11-18: 5 mL via INTRADERMAL
  Filled 2012-11-18: qty 5

## 2012-11-18 NOTE — ED Notes (Addendum)
Pt from home reports that she slipped today while trying to go to bathroom cutting her L shin on a screw on her shower chair. Pt has approx 2in lac to L shin. Bleeding has stopped.  Pt adds that she is having dysuria, frequency, and diarrhea x3 days. Pt is taking AZO. Pt on blood thinners for hx of afib. Pt is A&O and in NAD

## 2012-11-18 NOTE — ED Provider Notes (Signed)
CSN: [id number]     Arrival date & time 11/18/12  1546 History   First MD Initiated Contact with Patient 11/18/12 1628     Chief Complaint  Patient presents with  . Extremity Laceration  . Dysuria   (Consider location/radiation/quality/duration/timing/severity/associated sxs/prior Treatment) HPI Comments: 67 year old female with a history of a right lower extremity amputation, presents with a left lower extremity laceration which occurred a short time prior to arrival. This was acute in onset, persistent, associated with mild bleeding. This occurred when she slipped on her bathroom floor falling into a structure that caused a laceration to her anterior shin in the mid lower extremity below the knee. Bleeding was controlled with pressure and a dressing. Up-to-date on tetanus within the last 5-6 years.  The patient also complains of dysuria over the last several days  Patient is a 67 y.o. female presenting with dysuria. The history is provided by the patient, a friend and medical records.  Dysuria Associated symptoms: no fever and no vomiting     Past Medical History  Diagnosis Date  . Emphysema   . Atrial fibrillation   . COPD (chronic obstructive pulmonary disease)   . History of atrial fibrillation     S/pP Ablation  . Asthma   . Arthritis   . Osteoarthritis   . Rheumatoid arthritis(714.0)   . Anemia   . B12 deficiency anemia   . Stroke     slight stroke in 2009-no deficits  . Neuromuscular disorder     hx of right quadricep rupture  . DJD (degenerative joint disease)   . History of benign bladder tumor   . DVT (deep venous thrombosis)   . Blood transfusion     2009   . Pneumonia     hx of  . GERD (gastroesophageal reflux disease)   . CHF (congestive heart failure)   . Tobacco abuse    Past Surgical History  Procedure Laterality Date  . Replacement total knee    . Cardiac electrophysiology mapping and ablation    . Cholecystectomy  1996  . Cataract extraction,  bilateral    . Hematoma evacuation  2009    From right groin  . Joint replacement      Left-1996, Right-1995  . Multiple tooth extractions  06/30/2010  . Trigger finger release  2011  . Amputation  12/17/2010    Procedure: AMPUTATION ABOVE KNEE;  Surgeon: [name];  Location: [facility];  Service: Orthopedics;  Laterality: Right;  . Application of wound vac  12/17/2010    Procedure: APPLICATION OF WOUND VAC;  Surgeon: [name];  Location: [facility];  Service: Orthopedics;  Laterality: Right;  wound Vac applied @ 2045 By Luna Fuse  . Amputation  04/27/2011    Procedure: AMPUTATION ABOVE KNEE;  Surgeon: [name], MD;  Location: [facility];  Service: Orthopedics;  Laterality: Right;  Revision Right above knee amputation.  . Cardiac catheterization    . Cardioversion      x5 times  . Amputation  02/28/2012    Procedure: AMPUTATION BELOW KNEE;  Surgeon: [name], MD;  Location: [facility];  Service: Orthopedics;  Laterality: Right;  SCAR REVISION  . Tenosynovectomy      left long and ring fingers on 04/19/12  . Amputation Right 06/06/2012    Procedure: REVISION ABOVE RIGHT KNEE AMPUTATION, STUMP ;  Surgeon: [name], MD;  Location: [facility];  Service: Orthopedics;  Laterality: Right;  . I&d extremity Right 06/27/2012  Procedure: IRRIGATION AND DEBRIDEMENT hematoma right AKA;  Surgeon: [name], MD;  Location: [facility];  Service: Orthopedics;  Laterality: Right;  . Irrigation and debridement knee Right 09/12/2012    Procedure: REVISION RIGHT LEG WOUND AND PRIMARY CLOSURE ;  Surgeon: [name], MD;  Location: [facility];  Service: Orthopedics;  Laterality: Right;   Family History  Problem Relation Age of Onset  . Achondroplasia Father   . Congenital heart disease Mother   . Hypertension Mother   . Hypertension Sister    History  Substance Use Topics  . Smoking status: Former Smoker -- 1.50 packs/day for 22 years  . Smokeless tobacco: Never Used  . Alcohol Use: No   OB History    Grav Para Term Preterm Abortions TAB SAB Ect Mult Living                 Review of Systems  Constitutional: Negative for fever.  Gastrointestinal: Negative for vomiting.  Genitourinary: Positive for dysuria.  Skin: Positive for wound.       Laceration  Neurological: Negative for weakness and numbness.    Allergies  Sulfa antibiotics; Adhesive; Avelox; Tizanidine; and Penicillins  Home Medications   Current Outpatient Rx  Name  Route  Sig  Dispense  Refill  . acetaminophen (TYLENOL) 500 MG tablet   Oral   Take 1,000 mg by mouth every 6 (six) hours as needed for pain.         . Albuterol Sulfate (PROAIR HFA IN)   Inhalation   Inhale 2 puffs into the lungs every 6 (six) hours as needed (For shortness of breath.).          Marland Kitchen amiodarone (PACERONE) 200 MG tablet   Oral   Take 200 mg by mouth every evening.          Marland Kitchen ascorbic acid (VITAMIN C) 1000 MG tablet   Oral   Take 1,000 mg by mouth at bedtime.         . celecoxib (CELEBREX) 200 MG capsule   Oral   Take 200 mg by mouth every evening.          Marland Kitchen CRANBERRY PO   Oral   Take 2 tablets by mouth at bedtime.         . dabigatran (PRADAXA) 150 MG CAPS   Oral   Take 150 mg by mouth daily.          Marland Kitchen dimenhyDRINATE (DRAMAMINE) 50 MG tablet   Oral   Take 50 mg by mouth at bedtime as needed (sleep).         . diphenhydrAMINE (BENADRYL) 25 mg capsule   Oral   Take 50 mg by mouth at bedtime as needed for itching or sleep.         Marland Kitchen Fluticasone Furoate-Vilanterol (BREO ELLIPTA) 100-25 MCG/INH AEPB   Inhalation   Inhale 1 puff into the lungs at bedtime.         Marland Kitchen HYDROcodone-acetaminophen (NORCO) 7.5-325 MG per tablet   Oral   Take 1-2 tablets by mouth every 4 (four) hours as needed for pain.   80 tablet   0   . hydroxypropyl methylcellulose (ISOPTO TEARS) 2.5 % ophthalmic solution   Both Eyes   Place 1 drop into both eyes daily as needed (For dry eyes or allergies.). As needed         .  methocarbamol (ROBAXIN) 500 MG tablet   Oral   Take 1 tablet (500  mg total) by mouth every 6 (six) hours as needed (muscle spasms).   50 tablet   0   . nystatin (MYCOSTATIN/NYSTOP) 100000 UNIT/GM POWD   Topical   Apply 1 Bottle topically 2 (two) times daily as needed (For heat rash under breasts and in groin area.).          Marland Kitchen omeprazole (PRILOSEC) 20 MG capsule   Oral   Take 20 mg by mouth at bedtime.         . pregabalin (LYRICA) 50 MG capsule   Oral   Take 50 mg by mouth 2 (two) times daily as needed (For pain.).         Marland Kitchen pyridoxine (B-6) 100 MG tablet   Oral   Take 100 mg by mouth every evening.          . traZODone (DESYREL) 50 MG tablet   Oral   Take 50 mg by mouth at bedtime.         . cephALEXin (KEFLEX) 500 MG capsule   Oral   Take 1 capsule (500 mg total) by mouth 4 (four) times daily.   28 capsule   0   . naproxen (NAPROSYN) 500 MG tablet   Oral   Take 1 tablet (500 mg total) by mouth 2 (two) times daily with a meal.   30 tablet   0    BP 117/53  Pulse 59  Temp(Src) 98 F (36.7 C) (Oral)  Resp 18  SpO2 97% Physical Exam  Constitutional: She appears well-developed and well-nourished. No distress.  HENT:  Head: Normocephalic.  Eyes: Conjunctivae are normal. No scleral icterus.  Cardiovascular: Normal rate and regular rhythm.   Pulmonary/Chest: Effort normal.  Musculoskeletal: Normal range of motion. She exhibits tenderness ( ttp over the laceration site of the left lower extremity, over the shin. This is approximately 5 cm in length, linear, superficial. This does not penetrate the fascia, there are no obvious foreign bodies on exploration). She exhibits no edema.  Neurological: She is alert. Coordination normal.  Sensation and motor intact - distal to the laceration  Skin: Skin is warm and dry. She is not diaphoretic.     Laceration noted as abov    ED Course  Procedures (including critical care time) Labs Review Labs Reviewed    URINALYSIS, ROUTINE W REFLEX MICROSCOPIC - Abnormal; Notable for the following:    Color, Urine RED (*)    APPearance CLOUDY (*)    Hgb urine dipstick TRACE (*)    Bilirubin Urine SMALL (*)    Ketones, ur 15 (*)    Urobilinogen, UA 2.0 (*)    Nitrite POSITIVE (*)    Leukocytes, UA LARGE (*)    All other components within normal limits  URINE CULTURE  URINE MICROSCOPIC-ADD ON   Imaging Review No results found.  EKG Interpretation   None       MDM   1. UTI (lower urinary tract infection)   2. Laceration of left lower leg, initial encounter    Laceration, amenable to primary repair, urinalysis does show urinary tract infection which will require antibiotics. The wound will be closed primarily, tetanus is up-to-date at this is a clean wound. Patient appears stable for discharge after repair.  LACERATION REPAIR Performed by: Vida Roller Authorized by: Vida Roller Consent: Verbal consent obtained. Risks and benefits: risks, benefits and alternatives were discussed Consent given by: patient Patient identity confirmed: provided demographic data Prepped and Draped in normal sterile fashion Wound explored  Laceration Location: LLE mid shin  Laceration Length: 5 cm  No Foreign Bodies seen or palpated  Anesthesia: local infiltration  Local anesthetic: lidocaine 1% without epinephrine  Anesthetic total: 4 ml  Irrigation method: syringe Amount of cleaning: standard  Skin closure: 4-0 prolene  Number of sutures: 9  Technique: Simple interrupted  Patient tolerance: Patient tolerated the procedure well with no immediate complications.  Also has UTI - will need abx, wound care and f/u.   Meds given in ED:  Medications  bacitracin ointment 1 application (not administered)  cephALEXin (KEFLEX) capsule 500 mg (not administered)  lidocaine (PF) (XYLOCAINE) 1 % injection 5 mL (5 mLs Intradermal Given by Other 11/18/12 1811)    New Prescriptions   CEPHALEXIN  (KEFLEX) 500 MG CAPSULE    Take 1 capsule (500 mg total) by mouth 4 (four) times daily.   NAPROXEN (NAPROSYN) 500 MG TABLET    Take 1 tablet (500 mg total) by mouth 2 (two) times daily with a meal.      Vida Roller, MD 11/18/12 [phone]

## 2012-11-20 LAB — URINE CULTURE: Colony Count: 45000

## 2012-11-21 NOTE — ED Notes (Signed)
Post ED Visit - Positive Culture Follow-up: Successful Patient Follow-Up  Culture assessed and recommendations reviewed by: [x]  Wes Dulaney, Pharm.D., BCPS []  Celedonio Miyamoto, Pharm.D., BCPS []  Georgina Pillion, Pharm.D., BCPS []  [place], [place].D., BCPS, AAHIVP []  Estella Husk, Pharm.D., BCPS, AAHIVP  Positive urine culture  []  Patient discharged without antimicrobial prescription and treatment is now indicated [x]  Organism is resistant to prescribed ED discharge antimicrobial []  Patient with positive blood cultures  Changes discussed with ED provider: Ebbie Ridge New antibiotic prescription Start Macrobid 100 mg po BID x 7 days Called to [facility] [phone] Patient informed of positive results.   Larena Sox 11/21/2012, 3:53 PM

## 2012-11-21 NOTE — Progress Notes (Signed)
ED Antimicrobial Stewardship Positive Culture Follow Up   [name] is an 67 y.o. female who presented to [facility] on 11/18/2012 with a chief complaint of  Chief Complaint  Patient presents with  . Extremity Laceration  . Dysuria    Recent Results (from the past 720 hour(s))  URINE CULTURE     Status: None   Collection Time    11/18/12  4:19 PM      Result Value Range Status   Specimen Description URINE, CLEAN CATCH   Final   Special Requests NONE   Final   Culture  Setup Time     Final   Value: 11/18/2012 22:19     Performed at [facility] Count     Final   Value: 45,000 COLONIES/ML     Performed at [facility]   Culture     Final   Value: CITROBACTER FREUNDII     Performed at [facility]   Report Status 11/20/2012 FINAL   Final   Organism ID, Bacteria CITROBACTER FREUNDII   Final    [x]  Treated with Cephalexin, organism resistant to prescribed antimicrobial []  Patient discharged originally without antimicrobial agent and treatment is now indicated  New antibiotic prescription: Macrobid 100mg  PO BID x 7 days  ED Provider: Ebbie Ridge, PA-C   Cleon Dew 11/21/2012, 4:23 PM Infectious Diseases Pharmacist Phone# [phone]

## 2012-11-28 ENCOUNTER — Encounter (HOSPITAL_COMMUNITY): Payer: Self-pay | Admitting: Emergency Medicine

## 2012-11-28 ENCOUNTER — Emergency Department (HOSPITAL_COMMUNITY)
Admission: EM | Admit: 2012-11-28 | Discharge: 2012-11-28 | Disposition: A | Discharge status: Home / Self Care | Payer: Medicare Other | Source: Home / Self Care | Attending: Emergency Medicine | Admitting: Emergency Medicine

## 2012-11-28 DIAGNOSIS — Z8701 Personal history of pneumonia (recurrent): Secondary | ICD-10-CM | POA: Insufficient documentation

## 2012-11-28 DIAGNOSIS — J4489 Other specified chronic obstructive pulmonary disease: Secondary | ICD-10-CM | POA: Insufficient documentation

## 2012-11-28 DIAGNOSIS — J449 Chronic obstructive pulmonary disease, unspecified: Secondary | ICD-10-CM | POA: Insufficient documentation

## 2012-11-28 DIAGNOSIS — K219 Gastro-esophageal reflux disease without esophagitis: Secondary | ICD-10-CM | POA: Insufficient documentation

## 2012-11-28 DIAGNOSIS — M7989 Other specified soft tissue disorders: Secondary | ICD-10-CM | POA: Insufficient documentation

## 2012-11-28 DIAGNOSIS — Z4801 Encounter for change or removal of surgical wound dressing: Secondary | ICD-10-CM | POA: Insufficient documentation

## 2012-11-28 DIAGNOSIS — Z880 Allergy status to penicillin: Secondary | ICD-10-CM | POA: Insufficient documentation

## 2012-11-28 DIAGNOSIS — M069 Rheumatoid arthritis, unspecified: Secondary | ICD-10-CM | POA: Insufficient documentation

## 2012-11-28 DIAGNOSIS — Z8669 Personal history of other diseases of the nervous system and sense organs: Secondary | ICD-10-CM | POA: Insufficient documentation

## 2012-11-28 DIAGNOSIS — I509 Heart failure, unspecified: Secondary | ICD-10-CM | POA: Insufficient documentation

## 2012-11-28 DIAGNOSIS — Z87891 Personal history of nicotine dependence: Secondary | ICD-10-CM | POA: Insufficient documentation

## 2012-11-28 DIAGNOSIS — Z5189 Encounter for other specified aftercare: Secondary | ICD-10-CM

## 2012-11-28 DIAGNOSIS — Z8673 Personal history of transient ischemic attack (TIA), and cerebral infarction without residual deficits: Secondary | ICD-10-CM | POA: Insufficient documentation

## 2012-11-28 DIAGNOSIS — Z79899 Other long term (current) drug therapy: Secondary | ICD-10-CM | POA: Insufficient documentation

## 2012-11-28 DIAGNOSIS — Z862 Personal history of diseases of the blood and blood-forming organs and certain disorders involving the immune mechanism: Secondary | ICD-10-CM | POA: Insufficient documentation

## 2012-11-28 DIAGNOSIS — M1990 Unspecified osteoarthritis, unspecified site: Secondary | ICD-10-CM | POA: Insufficient documentation

## 2012-11-28 DIAGNOSIS — Z86718 Personal history of other venous thrombosis and embolism: Secondary | ICD-10-CM | POA: Insufficient documentation

## 2012-11-28 MED ORDER — CLINDAMYCIN HCL 150 MG PO CAPS: 300.0000 mg | ORAL_CAPSULE | Freq: Three times a day (TID) | ORAL | Status: DC

## 2012-11-28 NOTE — ED Provider Notes (Signed)
CSN: [id number]     Arrival date & time 11/28/12  1824 History   First MD Initiated Contact with Patient 11/28/12 1836     Chief Complaint  Patient presents with  . Extremity Laceration  . Leg Swelling   (Consider location/radiation/quality/duration/timing/severity/associated sxs/prior Treatment) HPI Comments: Patient presents to the emergency department with chief complaint of wound check. She states that she sustained a left leg laceration on Saturday following a mechanical fall. She is concerned that it is becoming infected. She reports that she had a prior infection on her right leg, but ultimately led to an amputation. She endorses low-grade fever yesterday, which has improved with Tylenol, however she is also being treated for UTI. She denies any discharge or drainage from the laceration site. She states that seems low more swollen and a little red. She's been controlling pain with Vicodin and Tylenol.  The history is provided by the patient. No language interpreter was used.    Past Medical History  Diagnosis Date  . Emphysema   . Atrial fibrillation   . COPD (chronic obstructive pulmonary disease)   . History of atrial fibrillation     S/pP Ablation  . Asthma   . Arthritis   . Osteoarthritis   . Rheumatoid arthritis(714.0)   . Anemia   . B12 deficiency anemia   . Stroke     slight stroke in 2009-no deficits  . Neuromuscular disorder     hx of right quadricep rupture  . DJD (degenerative joint disease)   . History of benign bladder tumor   . DVT (deep venous thrombosis)   . Blood transfusion     2009   . Pneumonia     hx of  . GERD (gastroesophageal reflux disease)   . CHF (congestive heart failure)   . Tobacco abuse    Past Surgical History  Procedure Laterality Date  . Replacement total knee    . Cardiac electrophysiology mapping and ablation    . Cholecystectomy  1996  . Cataract extraction, bilateral    . Hematoma evacuation  2009    From right groin  .  Joint replacement      Left-1996, Right-1995  . Multiple tooth extractions  06/30/2010  . Trigger finger release  2011  . Amputation  12/17/2010    Procedure: AMPUTATION ABOVE KNEE;  Surgeon: [name];  Location: [facility];  Service: Orthopedics;  Laterality: Right;  . Application of wound vac  12/17/2010    Procedure: APPLICATION OF WOUND VAC;  Surgeon: [name];  Location: [facility];  Service: Orthopedics;  Laterality: Right;  wound Vac applied @ 2045 By Luna Fuse  . Amputation  04/27/2011    Procedure: AMPUTATION ABOVE KNEE;  Surgeon: [name], MD;  Location: [facility];  Service: Orthopedics;  Laterality: Right;  Revision Right above knee amputation.  . Cardiac catheterization    . Cardioversion      x5 times  . Amputation  02/28/2012    Procedure: AMPUTATION BELOW KNEE;  Surgeon: [name], MD;  Location: [facility];  Service: Orthopedics;  Laterality: Right;  SCAR REVISION  . Tenosynovectomy      left long and ring fingers on 04/19/12  . Amputation Right 06/06/2012    Procedure: REVISION ABOVE RIGHT KNEE AMPUTATION, STUMP ;  Surgeon: [name], MD;  Location: [facility];  Service: Orthopedics;  Laterality: Right;  . I&d extremity Right 06/27/2012    Procedure: IRRIGATION AND DEBRIDEMENT hematoma right AKA;  Surgeon: [name], MD;  Location: [facility];  Service: Orthopedics;  Laterality: Right;  . Irrigation and debridement knee Right 09/12/2012    Procedure: REVISION RIGHT LEG WOUND AND PRIMARY CLOSURE ;  Surgeon: [name], MD;  Location: [facility];  Service: Orthopedics;  Laterality: Right;   Family History  Problem Relation Age of Onset  . Achondroplasia Father   . Congenital heart disease Mother   . Hypertension Mother   . Hypertension Sister    History  Substance Use Topics  . Smoking status: Former Smoker -- 1.50 packs/day for 22 years  . Smokeless tobacco: Never Used  . Alcohol Use: No   OB History   Grav Para Term Preterm Abortions TAB SAB Ect Mult Living                  Review of Systems  All other systems reviewed and are negative.    Allergies  Sulfa antibiotics; Adhesive; Avelox; Tizanidine; and Penicillins  Home Medications   Current Outpatient Rx  Name  Route  Sig  Dispense  Refill  . acetaminophen (TYLENOL) 500 MG tablet   Oral   Take 1,000 mg by mouth every 6 (six) hours as needed for pain.         . Albuterol Sulfate (PROAIR HFA IN)   Inhalation   Inhale 2 puffs into the lungs every 6 (six) hours as needed (For shortness of breath.).          Marland Kitchen amiodarone (PACERONE) 200 MG tablet   Oral   Take 200 mg by mouth every evening.          Marland Kitchen ascorbic acid (VITAMIN C) 1000 MG tablet   Oral   Take 1,000 mg by mouth at bedtime.         . celecoxib (CELEBREX) 200 MG capsule   Oral   Take 200 mg by mouth every evening.          Marland Kitchen CRANBERRY PO   Oral   Take 2 tablets by mouth at bedtime.         . dabigatran (PRADAXA) 150 MG CAPS   Oral   Take 150 mg by mouth daily.          Marland Kitchen dimenhyDRINATE (DRAMAMINE) 50 MG tablet   Oral   Take 50 mg by mouth at bedtime as needed (sleep).         . diphenhydrAMINE (BENADRYL) 25 mg capsule   Oral   Take 50 mg by mouth at bedtime as needed for itching or sleep.         Marland Kitchen Fluticasone Furoate-Vilanterol (BREO ELLIPTA) 100-25 MCG/INH AEPB   Inhalation   Inhale 1 puff into the lungs at bedtime.         Marland Kitchen HYDROcodone-acetaminophen (NORCO) 7.5-325 MG per tablet   Oral   Take 1-2 tablets by mouth every 4 (four) hours as needed for pain.   80 tablet   0   . hydroxypropyl methylcellulose (ISOPTO TEARS) 2.5 % ophthalmic solution   Both Eyes   Place 1 drop into both eyes daily as needed (For dry eyes or allergies.). As needed         . methocarbamol (ROBAXIN) 500 MG tablet   Oral   Take 1 tablet (500 mg total) by mouth every 6 (six) hours as needed (muscle spasms).   50 tablet   0   . naproxen (NAPROSYN) 500 MG tablet   Oral   Take 1 tablet (  500 mg total) by  mouth 2 (two) times daily with a meal.   30 tablet   0   . omeprazole (PRILOSEC) 20 MG capsule   Oral   Take 20 mg by mouth at bedtime.         . pregabalin (LYRICA) 50 MG capsule   Oral   Take 50 mg by mouth 2 (two) times daily as needed (For pain.).         Marland Kitchen pyridoxine (B-6) 100 MG tablet   Oral   Take 100 mg by mouth every evening.          . traZODone (DESYREL) 50 MG tablet   Oral   Take 50 mg by mouth at bedtime.          BP 105/59  Pulse 67  Temp(Src) 98.1 F (36.7 C) (Oral)  Resp 20  SpO2 94% Physical Exam  Nursing note and vitals reviewed. Constitutional: She is oriented to person, place, and time. She appears well-developed and well-nourished.  HENT:  Head: Normocephalic and atraumatic.  Eyes: Conjunctivae and EOM are normal. Pupils are equal, round, and reactive to light.  Neck: Normal range of motion. Neck supple.  Cardiovascular: Normal rate and regular rhythm.  Exam reveals no gallop and no friction rub.   No murmur heard. Pulmonary/Chest: Effort normal and breath sounds normal. No respiratory distress. She has no wheezes. She has no rales. She exhibits no tenderness.  Abdominal: She exhibits no distension.  Musculoskeletal: Normal range of motion. She exhibits no edema and no tenderness.  Neurological: She is alert and oriented to person, place, and time.  Skin: Skin is warm and dry.  Well-healing laceration of the left leg, no discharge or drainage, no surrounding cellulitis, mild swelling  Psychiatric: She has a normal mood and affect. Her behavior is normal. Judgment and thought content normal.    ED Course  Procedures (including critical care time) Labs Review Labs Reviewed - No data to display Imaging Review No results found.  EKG Interpretation   None       MDM   1. Visit for wound check     Patient with well-healing laceration to left lower extremity.  No evidence of infection.  Patient seen by and discussed with Dr.  Romeo Apple.  Removed two of the middle stitches to allow for draining.  Will treat with clinda as the patient has sulfa allergy and has been on keflex chronically..  Return precautions are given.  Patient is stable and ready for discharge.   Roxy Horseman, PA-C 11/28/12 1918  Roxy Horseman, PA-C 11/28/12 1926

## 2012-11-28 NOTE — ED Notes (Signed)
Pt c/o of right leg laceration. States that she slipped and fell and hit leg on door. Think it may be infection. Leg swelling and redness.

## 2012-11-29 NOTE — ED Provider Notes (Addendum)
Medical screening examination/treatment/procedure(s) were conducted as a shared visit with non-physician practitioner(s) and myself.  I personally evaluated the patient during the encounter.  I interviewed and examined the patient. Lungs are CTAB. Cardiac exam wnl. Abdomen soft. Healing laceration on left mid shin. No obvious drainage or signs of infection. Will remove several sutures to allow drainage from wound. 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 Given this hx, I think it would be reasonable to place the pt on prophylactic abx to prevent wound infection. Will recommend continued monitoring and return for any (inc redness, swelling, pain, fever, purulent drainage).   Junius Argyle, MD 11/29/12 1108  Junius Argyle, MD 11/29/12 1110

## 2012-11-30 ENCOUNTER — Encounter (HOSPITAL_COMMUNITY): Payer: Self-pay | Admitting: Emergency Medicine

## 2012-11-30 ENCOUNTER — Emergency Department (HOSPITAL_COMMUNITY): Payer: Medicare Other

## 2012-11-30 ENCOUNTER — Inpatient Hospital Stay (HOSPITAL_COMMUNITY)
Admission: EM | Admit: 2012-11-30 | Discharge: 2012-12-02 | DRG: 603 | Disposition: A | Discharge status: Home Health | Payer: Medicare Other | Attending: Internal Medicine | Admitting: Internal Medicine

## 2012-11-30 DIAGNOSIS — I739 Peripheral vascular disease, unspecified: Secondary | ICD-10-CM | POA: Diagnosis present

## 2012-11-30 DIAGNOSIS — L02419 Cutaneous abscess of limb, unspecified: Principal | ICD-10-CM | POA: Diagnosis present

## 2012-11-30 DIAGNOSIS — J4489 Other specified chronic obstructive pulmonary disease: Secondary | ICD-10-CM | POA: Diagnosis present

## 2012-11-30 DIAGNOSIS — Z89619 Acquired absence of unspecified leg above knee: Secondary | ICD-10-CM

## 2012-11-30 DIAGNOSIS — I4891 Unspecified atrial fibrillation: Secondary | ICD-10-CM

## 2012-11-30 DIAGNOSIS — Z87891 Personal history of nicotine dependence: Secondary | ICD-10-CM

## 2012-11-30 DIAGNOSIS — K219 Gastro-esophageal reflux disease without esophagitis: Secondary | ICD-10-CM | POA: Diagnosis present

## 2012-11-30 DIAGNOSIS — Z96659 Presence of unspecified artificial knee joint: Secondary | ICD-10-CM

## 2012-11-30 DIAGNOSIS — L03116 Cellulitis of left lower limb: Secondary | ICD-10-CM | POA: Diagnosis present

## 2012-11-30 DIAGNOSIS — D638 Anemia in other chronic diseases classified elsewhere: Secondary | ICD-10-CM | POA: Diagnosis present

## 2012-11-30 DIAGNOSIS — S88919A Complete traumatic amputation of unspecified lower leg, level unspecified, initial encounter: Secondary | ICD-10-CM

## 2012-11-30 DIAGNOSIS — Z79899 Other long term (current) drug therapy: Secondary | ICD-10-CM

## 2012-11-30 DIAGNOSIS — Z8673 Personal history of transient ischemic attack (TIA), and cerebral infarction without residual deficits: Secondary | ICD-10-CM

## 2012-11-30 DIAGNOSIS — L0390 Cellulitis, unspecified: Secondary | ICD-10-CM

## 2012-11-30 DIAGNOSIS — M1990 Unspecified osteoarthritis, unspecified site: Secondary | ICD-10-CM | POA: Diagnosis present

## 2012-11-30 DIAGNOSIS — S78119A Complete traumatic amputation at level between unspecified hip and knee, initial encounter: Secondary | ICD-10-CM

## 2012-11-30 DIAGNOSIS — Z86718 Personal history of other venous thrombosis and embolism: Secondary | ICD-10-CM

## 2012-11-30 DIAGNOSIS — M069 Rheumatoid arthritis, unspecified: Secondary | ICD-10-CM | POA: Diagnosis present

## 2012-11-30 DIAGNOSIS — T798XXA Other early complications of trauma, initial encounter: Secondary | ICD-10-CM

## 2012-11-30 DIAGNOSIS — L0291 Cutaneous abscess, unspecified: Secondary | ICD-10-CM

## 2012-11-30 DIAGNOSIS — J449 Chronic obstructive pulmonary disease, unspecified: Secondary | ICD-10-CM

## 2012-11-30 LAB — COMPREHENSIVE METABOLIC PANEL
ALT: 14 U/L (ref 0–35)
AST: 19 U/L (ref 0–37)
AST: 13 U/L (ref 0–37)
Albumin: 3.4 g/dL — ABNORMAL LOW (ref 3.5–5.2)
Albumin: 2.9 g/dL — ABNORMAL LOW (ref 3.5–5.2)
Alkaline Phosphatase: 74 U/L (ref 39–117)
Alkaline Phosphatase: 65 U/L (ref 39–117)
BUN: 9 mg/dL (ref 6–23)
CO2: 27 mEq/L (ref 19–32)
CO2: 29 mEq/L (ref 19–32)
Chloride: 99 mEq/L (ref 96–112)
Chloride: 98 mEq/L (ref 96–112)
Creatinine, Ser: 0.71 mg/dL (ref 0.50–1.10)
Creatinine, Ser: 0.88 mg/dL (ref 0.50–1.10)
GFR calc Af Amer: >90 mL/min (ref >90)
GFR calc non Af Amer: 87 mL/min — ABNORMAL LOW (ref >90)
GFR calc non Af Amer: 66 mL/min — ABNORMAL LOW (ref >90)
Glucose, Bld: 77 mg/dL (ref 70–99)
Potassium: 3.9 mEq/L (ref 3.5–5.1)
Sodium: 137 mEq/L (ref 135–145)
Total Bilirubin: 0.3 mg/dL (ref 0.3–1.2)
Total Bilirubin: 0.2 mg/dL — ABNORMAL LOW (ref 0.3–1.2)

## 2012-11-30 LAB — CBC WITH DIFFERENTIAL/PLATELET
Basophils Absolute: 0.1 10*3/uL (ref 0.0–0.1)
Basophils Absolute: 0 10*3/uL (ref 0.0–0.1)
Basophils Relative: 1 % (ref 0–1)
Eosinophils Absolute: 0.5 10*3/uL (ref 0.0–0.7)
Eosinophils Relative: 7 % — ABNORMAL HIGH (ref 0–5)
HCT: 33.3 % — ABNORMAL LOW (ref 36.0–46.0)
HCT: 30.3 % — ABNORMAL LOW (ref 36.0–46.0)
Hemoglobin: 11.7 g/dL — ABNORMAL LOW (ref 12.0–15.0)
Hemoglobin: 10.6 g/dL — ABNORMAL LOW (ref 12.0–15.0)
Lymphocytes Relative: 24 % (ref 12–46)
Lymphocytes Relative: 26 % (ref 12–46)
MCH: 37.3 pg — ABNORMAL HIGH (ref 26.0–34.0)
MCHC: 35 g/dL (ref 30.0–36.0)
MCV: 106.7 fL — ABNORMAL HIGH (ref 78.0–100.0)
Monocytes Absolute: 0.9 10*3/uL (ref 0.1–1.0)
Monocytes Absolute: 0.8 10*3/uL (ref 0.1–1.0)
Monocytes Relative: 10 % (ref 3–12)
Neutro Abs: 5.9 10*3/uL (ref 1.7–7.7)
Neutro Abs: 4.7 10*3/uL (ref 1.7–7.7)
Platelets: 295 10*3/uL (ref 150–400)
RBC: 2.84 MIL/uL — ABNORMAL LOW (ref 3.87–5.11)
RDW: 14.1 % (ref 11.5–15.5)
RDW: 14 % (ref 11.5–15.5)
WBC: 9.8 10*3/uL (ref 4.0–10.5)

## 2012-11-30 LAB — PROTIME-INR
INR: 1.09 (ref 0.00–1.49)
Prothrombin Time: 13.9 seconds (ref 11.6–15.2)

## 2012-11-30 LAB — PHOSPHORUS: Phosphorus: 3.8 mg/dL (ref 2.3–4.6)

## 2012-11-30 LAB — APTT: aPTT: 49 seconds — ABNORMAL HIGH (ref 24–37)

## 2012-11-30 LAB — MAGNESIUM: Magnesium: 1.8 mg/dL (ref 1.5–2.5)

## 2012-11-30 MED ORDER — HYPROMELLOSE (GONIOSCOPIC) 2.5 % OP SOLN: 1.0000 [drp] | Freq: Every day | OPHTHALMIC | Status: DC | PRN

## 2012-11-30 MED ORDER — VANCOMYCIN HCL IN DEXTROSE 1-5 GM/200ML-% IV SOLN
1000.0000 mg | INTRAVENOUS | Status: AC
  Administered 2012-11-30: 1000 mg via INTRAVENOUS
  Filled 2012-11-30: qty 200

## 2012-11-30 MED ORDER — PANTOPRAZOLE SODIUM 40 MG PO TBEC
40.0000 mg | DELAYED_RELEASE_TABLET | Freq: Every day | ORAL | Status: DC
Start: 2012-11-30 — End: 2012-12-02
  Administered 2012-11-30 – 2012-12-02 (×3): 40 mg via ORAL
  Filled 2012-11-30 (×3): qty 1

## 2012-11-30 MED ORDER — AMIODARONE HCL 200 MG PO TABS
200.0000 mg | ORAL_TABLET | Freq: Every evening | ORAL | Status: DC
  Administered 2012-11-30 – 2012-12-01 (×2): 200 mg via ORAL
  Filled 2012-11-30 (×3): qty 1

## 2012-11-30 MED ORDER — SODIUM CHLORIDE 0.9 % IV SOLN
Freq: Once | INTRAVENOUS | Status: AC
  Administered 2012-11-30: 17:00:00 via INTRAVENOUS

## 2012-11-30 MED ORDER — SODIUM CHLORIDE 0.9 % IV SOLN: INTRAVENOUS | Status: AC

## 2012-11-30 MED ORDER — ACETAMINOPHEN 325 MG PO TABS: 650.0000 mg | ORAL_TABLET | Freq: Four times a day (QID) | ORAL | Status: DC | PRN

## 2012-11-30 MED ORDER — SODIUM CHLORIDE 0.9 % IV SOLN
INTRAVENOUS | Status: AC
  Administered 2012-11-30: 22:00:00 via INTRAVENOUS

## 2012-11-30 MED ORDER — TRAZODONE HCL 50 MG PO TABS
50.0000 mg | ORAL_TABLET | Freq: Every day | ORAL | Status: DC
  Administered 2012-11-30 – 2012-12-01 (×2): 50 mg via ORAL
  Filled 2012-11-30 (×3): qty 1

## 2012-11-30 MED ORDER — CELECOXIB 200 MG PO CAPS
200.0000 mg | ORAL_CAPSULE | Freq: Every evening | ORAL | Status: DC
  Administered 2012-11-30 – 2012-12-01 (×2): 200 mg via ORAL
  Filled 2012-11-30 (×3): qty 1

## 2012-11-30 MED ORDER — ACETAMINOPHEN 650 MG RE SUPP: 650.0000 mg | Freq: Four times a day (QID) | RECTAL | Status: DC | PRN

## 2012-11-30 MED ORDER — MORPHINE SULFATE 4 MG/ML IJ SOLN
4.0000 mg | Freq: Once | INTRAMUSCULAR | Status: AC
  Administered 2012-11-30: 4 mg via INTRAVENOUS
  Filled 2012-11-30: qty 1

## 2012-11-30 MED ORDER — VANCOMYCIN HCL IN DEXTROSE 750-5 MG/150ML-% IV SOLN
750.0000 mg | Freq: Two times a day (BID) | INTRAVENOUS | Status: DC
  Administered 2012-12-01 – 2012-12-02 (×3): 750 mg via INTRAVENOUS
  Filled 2012-11-30 (×4): qty 150

## 2012-11-30 MED ORDER — VITAMIN B-6 100 MG PO TABS
100.0000 mg | ORAL_TABLET | Freq: Every evening | ORAL | Status: DC
  Administered 2012-11-30 – 2012-12-01 (×2): 100 mg via ORAL
  Filled 2012-11-30 (×3): qty 1

## 2012-11-30 MED ORDER — ONDANSETRON HCL 4 MG PO TABS: 4.0000 mg | ORAL_TABLET | Freq: Four times a day (QID) | ORAL | Status: DC | PRN

## 2012-11-30 MED ORDER — VITAMIN C 500 MG PO TABS
1000.0000 mg | ORAL_TABLET | Freq: Every day | ORAL | Status: DC
  Administered 2012-11-30 – 2012-12-01 (×2): 1000 mg via ORAL
  Filled 2012-11-30 (×3): qty 2

## 2012-11-30 MED ORDER — ENOXAPARIN SODIUM 30 MG/0.3ML ~~LOC~~ SOLN: 30.0000 mg | SUBCUTANEOUS | Status: DC

## 2012-11-30 MED ORDER — PREGABALIN 50 MG PO CAPS: 50.0000 mg | ORAL_CAPSULE | Freq: Two times a day (BID) | ORAL | Status: DC | PRN

## 2012-11-30 MED ORDER — METHOCARBAMOL 500 MG PO TABS: 500.0000 mg | ORAL_TABLET | Freq: Four times a day (QID) | ORAL | Status: DC | PRN

## 2012-11-30 MED ORDER — DEXTROSE 5 % IV SOLN
2.0000 g | INTRAVENOUS | Status: AC
  Administered 2012-11-30: 2 g via INTRAVENOUS
  Filled 2012-11-30: qty 2

## 2012-11-30 MED ORDER — HYDROCODONE-ACETAMINOPHEN 5-325 MG PO TABS
1.0000 | ORAL_TABLET | ORAL | Status: DC | PRN
  Administered 2012-12-01 – 2012-12-02 (×3): 2 via ORAL
  Filled 2012-11-30 (×3): qty 2

## 2012-11-30 MED ORDER — DEXTROSE 5 % IV SOLN
1.0000 g | Freq: Three times a day (TID) | INTRAVENOUS | Status: DC
  Administered 2012-12-01 – 2012-12-02 (×4): 1 g via INTRAVENOUS
  Filled 2012-11-30 (×6): qty 1

## 2012-11-30 MED ORDER — SODIUM CHLORIDE 0.9 % IJ SOLN
3.0000 mL | Freq: Two times a day (BID) | INTRAMUSCULAR | Status: DC
  Administered 2012-11-30: 3 mL via INTRAVENOUS

## 2012-11-30 MED ORDER — DIMENHYDRINATE 50 MG PO TABS
50.0000 mg | ORAL_TABLET | Freq: Every evening | ORAL | Status: DC | PRN
  Filled 2012-11-30: qty 1

## 2012-11-30 MED ORDER — FLUTICASONE FUROATE-VILANTEROL 100-25 MCG/INH IN AEPB: 1.0000 | INHALATION_SPRAY | Freq: Every day | RESPIRATORY_TRACT | Status: DC

## 2012-11-30 MED ORDER — DABIGATRAN ETEXILATE MESYLATE 150 MG PO CAPS
150.0000 mg | ORAL_CAPSULE | Freq: Every day | ORAL | Status: DC
  Administered 2012-11-30 – 2012-12-01 (×2): 150 mg via ORAL
  Filled 2012-11-30 (×2): qty 1

## 2012-11-30 MED ORDER — ALBUTEROL SULFATE (5 MG/ML) 0.5% IN NEBU: 2.5000 mg | INHALATION_SOLUTION | RESPIRATORY_TRACT | Status: DC | PRN

## 2012-11-30 MED ORDER — MORPHINE SULFATE 2 MG/ML IJ SOLN
1.0000 mg | INTRAMUSCULAR | Status: DC | PRN
  Administered 2012-12-01: 1 mg via INTRAVENOUS
  Filled 2012-11-30 (×2): qty 1

## 2012-11-30 MED ORDER — ONDANSETRON HCL 4 MG/2ML IJ SOLN: 4.0000 mg | Freq: Four times a day (QID) | INTRAMUSCULAR | Status: DC | PRN

## 2012-11-30 MED ORDER — DIPHENHYDRAMINE HCL 50 MG PO CAPS: 50.0000 mg | ORAL_CAPSULE | Freq: Every evening | ORAL | Status: DC | PRN

## 2012-11-30 NOTE — ED Notes (Signed)
Pt sent here by pcp for admission for cellulitis. Was seen here several days ago same. Was put on clindamycin at that point. Sent by Dr. Catha Nottingham associate.

## 2012-11-30 NOTE — Progress Notes (Signed)
Utilization Review completed.  Ellarose Brandi RN CM  

## 2012-11-30 NOTE — H&P (Addendum)
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 Patient was taking clindamycin but there was no significant improvement and she noticed increasing swelling and erythema at the side. This has been worsening since about 1 week prior to this admission after the fall. Patient reported associated fevers and chills and pain on ambulation.  In ED, vitals are stable with BP 114/43, HR 70 and O2 saturation of 92% on room air. BMP was unremarkable and CBC revealed mild anemia with hemoglobin of 11.7 (recent baseline around 9). X ray of left fibula was negative for acute findings. Pt was started on broad spectrum antibiotics on admission, vanco and zosyn.  Assessment and Plan:  Principal Problem:   Left leg cellulitis - pt started on vanco and aztreonam (given Zosyn allergy) empirically as she has history of non healing wounds in past.  - We will continue the antibiotics as initiated in ED but will consider discontinuing Aztreonam if infection improving.  - wound care consulted for input on wound care - PT eval ordered  Active Problems:   Anemia of chronic disease - secondary to history of atrial fibrillation - on amiodarone and aspirin/pradaxa   GERD (gastroesophageal reflux disease) - continue PPI therapy   COPD (chronic obstructive pulmonary disease) - stable - albuterol PRN nebulizer ordered if needed   DVT prophylaxis - Lovenox per pharmacy  Code Status: Full Family Communication: Pt at bedside Disposition Plan: Admit to medical floor   Review of  Systems:  Constitutional: Negative for diaphoresis.  HENT: Negative for hearing loss, ear pain, nosebleeds, congestion, sore throat, neck pain, tinnitus and ear discharge.   Eyes: Negative for blurred vision, double vision, photophobia, pain, discharge and redness.  Respiratory: Negative for cough, hemoptysis, sputum production, shortness of breath, wheezing and stridor.   Cardiovascular: Negative for chest pain, palpitations, orthopnea, claudication and leg swelling.  Gastrointestinal: Negative for nausea, vomiting and abdominal pain. Negative for heartburn, constipation, blood in stool and melena.  Genitourinary: Negative for dysuria, urgency, frequency, hematuria and flank pain.  Musculoskeletal: Negative for myalgias, back pain, joint pain and falls.  Skin: Negative for itching and rash. Positive for left leg swelling, redness, pain Neurological: Negative for dizziness and weakness. Negative for tingling, tremors, sensory change, speech change, focal weakness, loss of consciousness and headaches.  Endo/Heme/Allergies: Negative for environmental allergies and polydipsia. Does not bruise/bleed easily.  Psychiatric/Behavioral: Negative for suicidal ideas. The patient is not nervous/anxious.      Past Medical History  Diagnosis Date  . Emphysema   . Atrial fibrillation   . COPD (chronic obstructive pulmonary disease)   . History of atrial fibrillation     S/pP Ablation  . Asthma   . Arthritis   . Osteoarthritis   . Rheumatoid arthritis(714.0)   . Anemia   . B12 deficiency anemia   . Stroke     slight stroke in 2009-no deficits  . Neuromuscular disorder     hx of right quadricep rupture  . DJD (degenerative joint disease)   . History of benign bladder tumor   . DVT (deep venous thrombosis)   . Blood  transfusion     2009   . Pneumonia     hx of  . GERD (gastroesophageal reflux disease)   . CHF (congestive heart failure)   . Tobacco abuse     Past Surgical History  Procedure  Laterality Date  . Replacement total knee    . Cardiac electrophysiology mapping and ablation    . Cholecystectomy  1996  . Cataract extraction, bilateral    . Hematoma evacuation  2009    From right groin  . Joint replacement      Left-1996, Right-1995  . Multiple tooth extractions  06/30/2010  . Trigger finger release  2011  . Amputation  12/17/2010    Procedure: AMPUTATION ABOVE KNEE;  Surgeon: [name];  Location: [facility];  Service: Orthopedics;  Laterality: Right;  . Application of wound vac  12/17/2010    Procedure: APPLICATION OF WOUND VAC;  Surgeon: [name];  Location: [facility];  Service: Orthopedics;  Laterality: Right;  wound Vac applied @ 2045 By Luna Fuse  . Amputation  04/27/2011    Procedure: AMPUTATION ABOVE KNEE;  Surgeon: [name], MD;  Location: [facility];  Service: Orthopedics;  Laterality: Right;  Revision Right above knee amputation.  . Cardiac catheterization    . Cardioversion      x5 times  . Amputation  02/28/2012    Procedure: AMPUTATION BELOW KNEE;  Surgeon: [name], MD;  Location: [facility];  Service: Orthopedics;  Laterality: Right;  SCAR REVISION  . Tenosynovectomy      left long and ring fingers on 04/19/12  . Amputation Right 06/06/2012    Procedure: REVISION ABOVE RIGHT KNEE AMPUTATION, STUMP ;  Surgeon: [name], MD;  Location: [facility];  Service: Orthopedics;  Laterality: Right;  . I&d extremity Right 06/27/2012    Procedure: IRRIGATION AND DEBRIDEMENT hematoma right AKA;  Surgeon: [name], MD;  Location: [facility];  Service: Orthopedics;  Laterality: Right;  . Irrigation and debridement knee Right 09/12/2012    Procedure: REVISION RIGHT LEG WOUND AND PRIMARY CLOSURE ;  Surgeon: [name], MD;  Location: [facility];  Service: Orthopedics;  Laterality: Right;    Social History:  reports that she has quit smoking. She has never used smokeless tobacco. She reports that she does not drink alcohol or use illicit drugs.  Allergies   Allergen Reactions  . Sulfa Antibiotics Anaphylaxis and Shortness Of Breath  . Adhesive [Tape] Other (See Comments)    Satiny and adhesive tape - blisters skin  . Avelox [Moxifloxacin Hcl In Nacl] Diarrhea and Nausea And Vomiting  . Tizanidine     UTI  . Penicillins Hives and Rash    Family History  Problem Relation Age of Onset  . Achondroplasia Father   . Congenital heart disease Mother   . Hypertension Mother   . Hypertension Sister     Prior to Admission medications   Medication Sig Start Date End Date Taking? Authorizing Provider  Albuterol Sulfate (PROAIR HFA IN) Inhale 2 puffs into the lungs every 6 (six) hours as needed (For shortness of breath.).    Yes Historical Provider, MD  amiodarone (PACERONE) 200 MG tablet Take 200 mg by mouth every evening.    Yes Historical Provider, MD  ascorbic acid (VITAMIN C) 1000 MG tablet Take 1,000 mg by mouth at bedtime.   Yes Historical Provider, MD  celecoxib (CELEBREX) 200 MG capsule Take 200 mg by mouth every evening.    Yes  Historical Provider, MD  clindamycin (CLEOCIN) 150 MG capsule Take 2 capsules (300 mg total) by mouth 3 (three) times daily. May dispense as 150mg  capsules 11/28/12  Yes Roxy Horseman, PA-C  CRANBERRY PO Take 2 tablets by mouth at bedtime.   Yes Historical Provider, MD  dabigatran (PRADAXA) 150 MG CAPS Take 150 mg by mouth daily.    Yes Historical Provider, MD  dimenhyDRINATE (DRAMAMINE) 50 MG tablet Take 50 mg by mouth at bedtime as needed (sleep).   Yes Historical Provider, MD  diphenhydrAMINE (BENADRYL) 25 mg capsule Take 50 mg by mouth at bedtime as needed for itching or sleep.   Yes Historical Provider, MD  Fluticasone Furoate-Vilanterol (BREO ELLIPTA) 100-25 MCG/INH AEPB Inhale 1 puff into the lungs at bedtime.   Yes Historical Provider, MD  HYDROcodone-acetaminophen (NORCO) 7.5-325 MG per tablet Take 1-2 tablets by mouth every 4 (four) hours as needed for pain. 09/14/12  Yes Genelle Gather Babish, PA-C   hydroxypropyl methylcellulose (ISOPTO TEARS) 2.5 % ophthalmic solution Place 1 drop into both eyes daily as needed (For dry eyes or allergies.). As needed   Yes Historical Provider, MD  methocarbamol (ROBAXIN) 500 MG tablet Take 1 tablet (500 mg total) by mouth every 6 (six) hours as needed (muscle spasms). 09/14/12  Yes Genelle Gather Babish, PA-C  naproxen (NAPROSYN) 500 MG tablet Take 1 tablet (500 mg total) by mouth 2 (two) times daily with a meal. 11/18/12  Yes Vida Roller, MD  omeprazole (PRILOSEC) 20 MG capsule Take 20 mg by mouth at bedtime.   Yes Historical Provider, MD  pregabalin (LYRICA) 50 MG capsule Take 50 mg by mouth 2 (two) times daily as needed (For pain.).   Yes Historical Provider, MD  pyridoxine (B-6) 100 MG tablet Take 100 mg by mouth every evening.    Yes Historical Provider, MD  traZODone (DESYREL) 50 MG tablet Take 50 mg by mouth at bedtime.   Yes Historical Provider, MD    Physical Exam: Filed Vitals:   11/30/12 1642  BP: 114/43  Pulse: 70  TempSrc: Oral  Resp: 20  SpO2: 92%    Physical Exam  Constitutional: Appears well-developed and well-nourished. No distress.  HENT: Normocephalic. External right and left ear normal. Oropharynx is clear and moist.  Eyes: Conjunctivae and EOM are normal. PERRLA, no scleral icterus.  Neck: Normal ROM. Neck supple. No JVD. No tracheal deviation. No thyromegaly.  CVS: RRR, S1/S2 +, no murmurs, no gallops, no carotid bruit.  Pulmonary: Effort and breath sounds normal, no stridor, rhonchi, wheezes, rales.  Abdominal: Soft. BS +,  no distension, tenderness, rebound or guarding.  Musculoskeletal: right AKA, laceration on anterior shin mid location with surrounding erythema and some swelling; faint left pedal pulse Lymphadenopathy: No lymphadenopathy noted, cervical, inguinal. Neuro: Alert. Normal reflexes, muscle tone coordination. No cranial nerve deficit. Skin: Skin is warm and dry. Left leg erythema, laceration on anterior  shin.  Psychiatric: Normal mood and affect. Behavior, judgment, thought content normal.   Labs on Admission:  Basic Metabolic Panel:  Recent Labs Lab 11/30/12 1730  NA 137  K 4.2  CL 99  CO2 27  GLUCOSE 77  BUN 8  CREATININE 0.71  CALCIUM 9.4   Liver Function Tests:  Recent Labs Lab 11/30/12 1730  AST 19  ALT 14  ALKPHOS 74  BILITOT 0.3  PROT 6.4  ALBUMIN 3.4*   CBC:  Recent Labs Lab 11/30/12 1730  WBC 9.8  NEUTROABS 5.9  HGB 11.7*  HCT 33.3*  MCV 106.1*  PLT 295   Radiological Exams on Admission: Dg Tibia/fibula Left  11/30/2012   No acute osseous finding      Debbora Presto, MD  [facility] Pager [phone]  If 7PM-7AM, please contact night-coverage [email or web address] Password [facility] 11/30/2012, 7:32 PM

## 2012-11-30 NOTE — Progress Notes (Signed)
ANTIBIOTIC CONSULT NOTE - INITIAL  Pharmacy Consult for Vancomycin, Zosyn Indication: cellulitis  Allergies  Allergen Reactions  . Sulfa Antibiotics Anaphylaxis and Shortness Of Breath  . Adhesive [Tape] Other (See Comments)    Satiny and adhesive tape - blisters skin  . Avelox [Moxifloxacin Hcl In Nacl] Diarrhea and Nausea And Vomiting  . Tizanidine     UTI  . Penicillins Hives and Rash    Patient Measurements:   Adjusted Body Weight:   Vital Signs: Temp src: Oral (10/23 1642) BP: 114/43 mmHg (10/23 1642) Pulse Rate: 70 (10/23 1642) Intake/Output from previous day:   Intake/Output from this shift:    Labs:  Recent Labs  11/30/12 1730  WBC 9.8  HGB 11.7*  PLT 295  CREATININE 0.71   The CrCl is unknown because both a height and weight (above a minimum accepted value) are required for this calculation. No results found for this basename: VANCOTROUGH, VANCOPEAK, VANCORANDOM, GENTTROUGH, GENTPEAK, GENTRANDOM, TOBRATROUGH, TOBRAPEAK, TOBRARND, AMIKACINPEAK, AMIKACINTROU, AMIKACIN,  in the last 72 hours    Medical History: Past Medical History  Diagnosis Date  . Emphysema   . Atrial fibrillation   . COPD (chronic obstructive pulmonary disease)   . History of atrial fibrillation     S/pP Ablation  . Asthma   . Arthritis   . Osteoarthritis   . Rheumatoid arthritis(714.0)   . Anemia   . B12 deficiency anemia   . Stroke     slight stroke in 2009-no deficits  . Neuromuscular disorder     hx of right quadricep rupture  . DJD (degenerative joint disease)   . History of benign bladder tumor   . DVT (deep venous thrombosis)   . Blood transfusion     2009   . Pneumonia     hx of  . GERD (gastroesophageal reflux disease)   . CHF (congestive heart failure)   . Tobacco abuse     Assessment: 56 yoF presented to [facility] 10/11 with c/o extremity laceration and dysuria. Pt was discharged on Keflex for possible UTI which was subsequently changed to [facility] on 10/14  after urine cx grew Citrobacter freudii that was resistant to Keflex. Pt returned to ED 10/21 for wound check with c/o low grade fever. MD noted laceration is healing well without evidence of infection and discharged pt on Clindamycin. On 10/23, PCP sent patient to ED for cellulitis. L tibia/fibula x-ray with no acute osseous finding. Vanc 1gm IV x 1 given in ED and continue per pharmacy dosing along with Zosyn but pt with PCN allergy - abx change to Aztreonam. Of note, patient with h/o R AKA in 2012 due to non-healing wound/infection.  10/23 >> Vanc >> 10/23 >> Aztreonam >>  Tmax: --- WBCs: 9.8K Renal: Scr 0.71, CG 76, N 78  Previous ED Visit 10/11 urine >> Citrobacter freudii  Goal of Therapy:  Vancomycin trough level 10-15 mcg/ml  Plan:   Continue Vancomycin with 750 mg IV q12h  Aztreonam 2g IV x 1 then 1g IV q8h  Pharmacy will f/u  Geoffry Paradise, PharmD, BCPS Pager: [phone] 7:38 PM Pharmacy #: [id number]

## 2012-11-30 NOTE — ED Notes (Signed)
Bed: WA24 Expected date:  Expected time:  Means of arrival:  Comments: Triage 1 

## 2012-11-30 NOTE — ED Provider Notes (Signed)
CSN: [id number]     Arrival date & time 11/30/12  1634 History   First MD Initiated Contact with Patient 11/30/12 1656     Chief Complaint  Patient presents with  . Wound Infection   (Consider location/radiation/quality/duration/timing/severity/associated sxs/prior Treatment) Patient is a 67 y.o. female presenting with leg pain. The history is provided by the patient. No language interpreter was used.  Leg Pain Location:  Leg Leg location:  L lower leg Pain details:    Quality:  Aching   Severity:  Moderate Associated symptoms: fever   Associated symptoms comment:  Anterior left leg laceration on 11/18/12 after a fall, which was repaired in the ED. She returned for recheck on 11/26/12 and started on Clindamycin. She continues to have worsening pain, redness, low grade temperature and is here as directed by her doctor for further evaluation and management. 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   Past Medical History  Diagnosis Date  . Emphysema   . Atrial fibrillation   . COPD (chronic obstructive pulmonary disease)   . History of atrial fibrillation     S/pP Ablation  . Asthma   . Arthritis   . Osteoarthritis   . Rheumatoid arthritis(714.0)   . Anemia   . B12 deficiency anemia   . Stroke     slight stroke in 2009-no deficits  . Neuromuscular disorder     hx of right quadricep rupture  . DJD (degenerative joint disease)   . History of benign bladder tumor   . DVT (deep venous thrombosis)   . Blood transfusion     2009   . Pneumonia     hx of  . GERD (gastroesophageal reflux disease)   . CHF (congestive heart failure)   . Tobacco abuse    Past Surgical History  Procedure Laterality Date  . Replacement total knee    . Cardiac electrophysiology mapping and ablation    . Cholecystectomy  1996  . Cataract extraction, bilateral    . Hematoma evacuation  2009    From right groin  . Joint replacement      Left-1996, Right-1995  . Multiple  tooth extractions  06/30/2010  . Trigger finger release  2011  . Amputation  12/17/2010    Procedure: AMPUTATION ABOVE KNEE;  Surgeon: [name];  Location: [facility];  Service: Orthopedics;  Laterality: Right;  . Application of wound vac  12/17/2010    Procedure: APPLICATION OF WOUND VAC;  Surgeon: [name];  Location: [facility];  Service: Orthopedics;  Laterality: Right;  wound Vac applied @ 2045 By Luna Fuse  . Amputation  04/27/2011    Procedure: AMPUTATION ABOVE KNEE;  Surgeon: [name], MD;  Location: [facility];  Service: Orthopedics;  Laterality: Right;  Revision Right above knee amputation.  . Cardiac catheterization    . Cardioversion      x5 times  . Amputation  02/28/2012    Procedure: AMPUTATION BELOW KNEE;  Surgeon: [name], MD;  Location: [facility];  Service: Orthopedics;  Laterality: Right;  SCAR REVISION  . Tenosynovectomy      left long and ring fingers on 04/19/12  . Amputation Right 06/06/2012    Procedure: REVISION ABOVE RIGHT KNEE AMPUTATION, STUMP ;  Surgeon: [name], MD;  Location: [facility];  Service: Orthopedics;  Laterality: Right;  . I&d extremity Right 06/27/2012    Procedure: IRRIGATION AND DEBRIDEMENT hematoma right AKA;  Surgeon: [name], MD;  Location: [facility];  Service: Orthopedics;  Laterality: Right;  . Irrigation and debridement knee Right 09/12/2012    Procedure: REVISION RIGHT LEG WOUND AND PRIMARY CLOSURE ;  Surgeon: [name], MD;  Location: [facility];  Service: Orthopedics;  Laterality: Right;   Family History  Problem Relation Age of Onset  . Achondroplasia Father   . Congenital heart disease Mother   . Hypertension Mother   . Hypertension Sister    History  Substance Use Topics  . Smoking status: Former Smoker -- 1.50 packs/day for 22 years  . Smokeless tobacco: Never Used  . Alcohol Use: No   OB History   Grav Para Term Preterm Abortions TAB SAB Ect Mult Living                 Review of Systems  Constitutional: Positive  for fever. Negative for chills.  Respiratory: Negative.  Negative for shortness of breath.   Cardiovascular: Negative.  Negative for chest pain.  Gastrointestinal: Negative.  Negative for abdominal pain.  Musculoskeletal:       See HPI.  Skin: Positive for color change and wound.  Neurological: Negative.  Negative for numbness.    Allergies  Sulfa antibiotics; Adhesive; Avelox; Tizanidine; and Penicillins  Home Medications   Current Outpatient Rx  Name  Route  Sig  Dispense  Refill  . Albuterol Sulfate (PROAIR HFA IN)   Inhalation   Inhale 2 puffs into the lungs every 6 (six) hours as needed (For shortness of breath.).          Marland Kitchen amiodarone (PACERONE) 200 MG tablet   Oral   Take 200 mg by mouth every evening.          Marland Kitchen ascorbic acid (VITAMIN C) 1000 MG tablet   Oral   Take 1,000 mg by mouth at bedtime.         . celecoxib (CELEBREX) 200 MG capsule   Oral   Take 200 mg by mouth every evening.          . clindamycin (CLEOCIN) 150 MG capsule   Oral   Take 2 capsules (300 mg total) by mouth 3 (three) times daily. May dispense as 150mg  capsules   60 capsule   0   . CRANBERRY PO   Oral   Take 2 tablets by mouth at bedtime.         . dabigatran (PRADAXA) 150 MG CAPS   Oral   Take 150 mg by mouth daily.          Marland Kitchen dimenhyDRINATE (DRAMAMINE) 50 MG tablet   Oral   Take 50 mg by mouth at bedtime as needed (sleep).         . diphenhydrAMINE (BENADRYL) 25 mg capsule   Oral   Take 50 mg by mouth at bedtime as needed for itching or sleep.         Marland Kitchen Fluticasone Furoate-Vilanterol (BREO ELLIPTA) 100-25 MCG/INH AEPB   Inhalation   Inhale 1 puff into the lungs at bedtime.         Marland Kitchen HYDROcodone-acetaminophen (NORCO) 7.5-325 MG per tablet   Oral   Take 1-2 tablets by mouth every 4 (four) hours as needed for pain.   80 tablet   0   . hydroxypropyl methylcellulose (ISOPTO TEARS) 2.5 % ophthalmic solution   Both Eyes   Place 1 drop into both eyes daily as  needed (For dry eyes or allergies.). As needed         .  methocarbamol (ROBAXIN) 500 MG tablet   Oral   Take 1 tablet (500 mg total) by mouth every 6 (six) hours as needed (muscle spasms).   50 tablet   0   . naproxen (NAPROSYN) 500 MG tablet   Oral   Take 1 tablet (500 mg total) by mouth 2 (two) times daily with a meal.   30 tablet   0   . omeprazole (PRILOSEC) 20 MG capsule   Oral   Take 20 mg by mouth at bedtime.         . pregabalin (LYRICA) 50 MG capsule   Oral   Take 50 mg by mouth 2 (two) times daily as needed (For pain.).         Marland Kitchen pyridoxine (B-6) 100 MG tablet   Oral   Take 100 mg by mouth every evening.          . traZODone (DESYREL) 50 MG tablet   Oral   Take 50 mg by mouth at bedtime.          BP 114/43  Pulse 70  Resp 20  SpO2 92% Physical Exam  Constitutional: She is oriented to person, place, and time. She appears well-developed and well-nourished.  Neck: Normal range of motion.  Pulmonary/Chest: Effort normal.  Abdominal: There is no tenderness.  Musculoskeletal:  Right AKA. Left lower leg with non-healing laceration anterior mid-shaft. Lower leg red, mildly swollen, diffusely tender, warm to touch. Knee, thigh non-tender and unremarkable in appearance.   Neurological: She is alert and oriented to person, place, and time.  Skin: Skin is warm and dry.    ED Course  Procedures (including critical care time) Labs Review Labs Reviewed  CBC WITH DIFFERENTIAL  COMPREHENSIVE METABOLIC PANEL   Results for orders placed during the hospital encounter of 11/30/12  CBC WITH DIFFERENTIAL      Result Value Range   WBC 9.8  4.0 - 10.5 K/uL   RBC 3.14 (*) 3.87 - 5.11 MIL/uL   Hemoglobin 11.7 (*) 12.0 - 15.0 g/dL   HCT 16.1 (*) 09.6 - 04.5 %   MCV 106.1 (*) 78.0 - 100.0 fL   MCH 37.3 (*) 26.0 - 34.0 pg   MCHC 35.1  30.0 - 36.0 g/dL   RDW 40.9  81.1 - 91.4 %   Platelets 295  150 - 400 K/uL   Neutrophils Relative % 60  43 - 77 %   Neutro Abs  5.9  1.7 - 7.7 K/uL   Lymphocytes Relative 24  12 - 46 %   Lymphs Abs 2.3  0.7 - 4.0 K/uL   Monocytes Relative 9  3 - 12 %   Monocytes Absolute 0.9  0.1 - 1.0 K/uL   Eosinophils Relative 7 (*) 0 - 5 %   Eosinophils Absolute 0.7  0.0 - 0.7 K/uL   Basophils Relative 1  0 - 1 %   Basophils Absolute 0.1  0.0 - 0.1 K/uL  COMPREHENSIVE METABOLIC PANEL      Result Value Range   Sodium 137  135 - 145 mEq/L   Potassium 4.2  3.5 - 5.1 mEq/L   Chloride 99  96 - 112 mEq/L   CO2 27  19 - 32 mEq/L   Glucose, Bld 77  70 - 99 mg/dL   BUN 8  6 - 23 mg/dL   Creatinine, Ser 7.82  0.50 - 1.10 mg/dL   Calcium 9.4  8.4 - 95.6 mg/dL   Total Protein 6.4  6.0 -  8.3 g/dL   Albumin 3.4 (*) 3.5 - 5.2 g/dL   AST 19  0 - 37 U/L   ALT 14  0 - 35 U/L   Alkaline Phosphatase 74  39 - 117 U/L   Total Bilirubin 0.3  0.3 - 1.2 mg/dL   GFR calc non Af Amer 87 (*) >90 mL/min   GFR calc Af Amer >90  >90 mL/min   Dg Tibia/fibula Left  11/30/2012   CLINICAL DATA:  Nonhealing wound of left mid anterior lower leg, redness  EXAM: LEFT TIBIA AND FIBULA - 2 VIEW  COMPARISON:  None.  FINDINGS: Previous left knee arthroplasty noted. Hardware appears intact. Normal alignment. Left tibia and fibula appear intact. No fracture evident. Left lower extremity and ankle soft tissue swelling evident.  IMPRESSION: No acute osseous finding   Electronically Signed   By: Ruel Favors M.D.   On: 11/30/2012 18:14   Imaging Review No results found.  EKG Interpretation   None       MDM  No diagnosis found. 1. Cellulitis 2. Wound infection  She has a history of poor healing in lower extremities, and per history this is the cause of the right amputation in 2012. No fever. Leg is swollen, appears infected with redness and wound dehiscence with mild purulent drainage. On outpatient [facility] without improvement. Will admit for IV abx.     Arnoldo Hooker, PA-C 11/30/12 1914

## 2012-11-30 NOTE — ED Notes (Signed)
Pt given dinner tray.

## 2012-11-30 NOTE — Progress Notes (Signed)
[facility] consulted regarding possible home health needs at discharge.  Patient currently has home health services with [facility].  As per patient, her nurse Maralyn Sago comes to the home Monday, Wednesday and Friday for wound care of her left leg.  Patient confirms she has a  elevated commode seat, shower chair, walker, cane, bedside commode, and wheelchair at home.  Patient reports that she has aquired all of this equipment after her amputation of her right leg.  Patient confirms she lives by herself in a six story apartment complex.  She reports she has very good friends that come and check up on her.  Patient reports she is extremely happy with the care she is recieving from [facility].  No further needs at this time.  EDCM offered support to patient.

## 2012-12-01 LAB — COMPREHENSIVE METABOLIC PANEL
ALT: 10 U/L (ref 0–35)
Alkaline Phosphatase: 62 U/L (ref 39–117)
BUN: 9 mg/dL (ref 6–23)
CO2: 29 mEq/L (ref 19–32)
Calcium: 8.9 mg/dL (ref 8.4–10.5)
Creatinine, Ser: 0.89 mg/dL (ref 0.50–1.10)
GFR calc Af Amer: 76 mL/min — ABNORMAL LOW (ref >90)
GFR calc non Af Amer: 66 mL/min — ABNORMAL LOW (ref >90)
Glucose, Bld: 100 mg/dL — ABNORMAL HIGH (ref 70–99)
Potassium: 3.9 mEq/L (ref 3.5–5.1)
Sodium: 138 mEq/L (ref 135–145)
Total Protein: 5.3 g/dL — ABNORMAL LOW (ref 6.0–8.3)

## 2012-12-01 LAB — GLUCOSE, CAPILLARY: Glucose-Capillary: 93 mg/dL (ref 70–99)

## 2012-12-01 LAB — CBC
HCT: 30.3 % — ABNORMAL LOW (ref 36.0–46.0)
Hemoglobin: 10.4 g/dL — ABNORMAL LOW (ref 12.0–15.0)
MCH: 36.9 pg — ABNORMAL HIGH (ref 26.0–34.0)
MCHC: 34.3 g/dL (ref 30.0–36.0)
MCV: 107.4 fL — ABNORMAL HIGH (ref 78.0–100.0)
RBC: 2.82 MIL/uL — ABNORMAL LOW (ref 3.87–5.11)
RDW: 14.2 % (ref 11.5–15.5)

## 2012-12-01 LAB — TSH: TSH: 1.63 u[IU]/mL (ref 0.350–4.500)

## 2012-12-01 MED ORDER — DABIGATRAN ETEXILATE MESYLATE 150 MG PO CAPS
150.0000 mg | ORAL_CAPSULE | Freq: Two times a day (BID) | ORAL | Status: DC
  Administered 2012-12-01 – 2012-12-02 (×2): 150 mg via ORAL
  Filled 2012-12-01 (×3): qty 1

## 2012-12-01 MED ORDER — ENSURE PUDDING PO PUDG
1.0000 | ORAL | Status: DC
  Administered 2012-12-01: 17:00:00 1 via ORAL
  Filled 2012-12-01 (×2): qty 1

## 2012-12-01 NOTE — Evaluation (Signed)
I have reviewed this note and agree with all findings. Kati Jalynne Persico, PT, DPT Pager: [phone]   

## 2012-12-01 NOTE — Progress Notes (Signed)
[facility] PROGRESS NOTE  [name]:[id number] DOB: 1945-12-17 DOA: 11/30/2012 PCP: Marylen Ponto, MD  Assessment/Plan: LLE Cellulitis -Following injury to leg. -Seems improved: not a large degree of edema or erythema. -Will keep abx as is for today. -If no fever/leukocytosis by tomorrow, will transition to PO abx and DC home with close OP follow up given her PVD and h/o prior amputation.  GERD (gastroesophageal reflux disease)  - continue PPI therapy   COPD (chronic obstructive pulmonary disease)  - stable  - albuterol PRN nebulizer ordered if needed   DVT prophylaxis  - Already on Pradaxa.  Atrial Fibrillation -Rate controlled. -On Pradaxa.    Code Status: Full Code Family Communication: Patient only  Disposition Plan: Home, likely in 24 hours.   Consultants:  None   Antibiotics:  Vancomycin   Subjective: Aztreonam  Objective: Filed Vitals:   11/30/12 1642 11/30/12 1944 11/30/12 2157 12/01/12 0606  BP: 114/43  98/45 96/42  Pulse: 70  61 72  Temp:   98.5 F (36.9 C) 97.9 F (36.6 C)  TempSrc: Oral  Oral Oral  Resp: 20  18 16   Height:  5\' 11"  (1.803 m) 5\' 11"  (1.803 m)   Weight:  74.39 kg (164 lb) 73 kg (160 lb 15 oz) 73.2 kg (161 lb 6 oz)  SpO2: 92%  94% 93%    Intake/Output Summary (Last 24 hours) at 12/01/12 1350 Last data filed at 12/01/12 0900  Gross per 24 hour  Intake   1210 ml  Output    800 ml  Net    410 ml   Filed Weights   11/30/12 1944 11/30/12 2157 12/01/12 0606  Weight: 74.39 kg (164 lb) 73 kg (160 lb 15 oz) 73.2 kg (161 lb 6 oz)    Exam:   General:  AA Ox3  Cardiovascular: RRR  Respiratory: CTA B  Abdomen: S/NT/ND/+BS  Extremities: s/p right AKA,   Neurologic:  Non-focal  Data Reviewed: Basic Metabolic Panel:  Recent Labs Lab 11/30/12 1730 11/30/12 2240 12/01/12 0524  NA 137 135 138  K 4.2 3.9 3.9  CL 99 98 102  CO2 27 29 29   GLUCOSE 77 131* 100*  BUN 8 9 9   CREATININE 0.71 0.88  0.89  CALCIUM 9.4 8.8 8.9  MG  --  1.8  --   PHOS  --  3.8  --    Liver Function Tests:  Recent Labs Lab 11/30/12 1730 11/30/12 2240 12/01/12 0524  AST 19 13 12   ALT 14 10 10   ALKPHOS 74 65 62  BILITOT 0.3 0.2* 0.2*  PROT 6.4 5.6* 5.3*  ALBUMIN 3.4* 2.9* 2.7*   No results found for this basename: LIPASE, AMYLASE,  in the last 168 hours No results found for this basename: AMMONIA,  in the last 168 hours CBC:  Recent Labs Lab 11/30/12 1730 11/30/12 2240 12/01/12 0524  WBC 9.8 8.2 8.2  NEUTROABS 5.9 4.7  --   HGB 11.7* 10.6* 10.4*  HCT 33.3* 30.3* 30.3*  MCV 106.1* 106.7* 107.4*  PLT 295 265 274   Cardiac Enzymes: No results found for this basename: CKTOTAL, CKMB, CKMBINDEX, TROPONINI,  in the last 168 hours BNP (last 3 results) No results found for this basename: PROBNP,  in the last 8760 hours CBG:  Recent Labs Lab 12/01/12 0829  GLUCAP 93    No results found for this or any previous visit (from the past 240 hour(s)).   Studies: Dg Tibia/fibula Left  11/30/2012   CLINICAL  DATA:  Nonhealing wound of left mid anterior lower leg, redness  EXAM: LEFT TIBIA AND FIBULA - 2 VIEW  COMPARISON:  None.  FINDINGS: Previous left knee arthroplasty noted. Hardware appears intact. Normal alignment. Left tibia and fibula appear intact. No fracture evident. Left lower extremity and ankle soft tissue swelling evident.  IMPRESSION: No acute osseous finding   Electronically Signed   By: Ruel Favors M.D.   On: 11/30/2012 18:14    Scheduled Meds: . sodium chloride   Intravenous STAT  . amiodarone  200 mg Oral QPM  . aztreonam  1 g Intravenous Q8H  . celecoxib  200 mg Oral QPM  . dabigatran  150 mg Oral Daily  . Fluticasone Furoate-Vilanterol  1 puff Inhalation QHS  . pantoprazole  40 mg Oral Daily  . pyridoxine  100 mg Oral QPM  . sodium chloride  3 mL Intravenous Q12H  . traZODone  50 mg Oral QHS  . vancomycin  750 mg Intravenous Q12H  . ascorbic acid  1,000 mg Oral QHS    Continuous Infusions:   Principal Problem:   Left leg cellulitis Active Problems:   GERD (gastroesophageal reflux disease)   COPD (chronic obstructive pulmonary disease)   Anemia of chronic disease    Time spent: 35 minutes    [name]  [facility] Pager [phone]  If 7PM-7AM, please contact night-coverage at [email or web address], password [facility] 12/01/2012, 1:50 PM  LOS: 1 day

## 2012-12-01 NOTE — Progress Notes (Addendum)
Pt use [facility] for Home Health needs.

## 2012-12-01 NOTE — Progress Notes (Signed)
Met with patient at bedside due to COPD GOLD referral. Explained services of [facility] Care Management and explained [facility] Care Management will not interfere with her current home health services. Consents signed.She will receive post hospital discharge call upon discharge and will be evaluated for monthly home visits. Left [facility] Care Management packet at bedside.  Raiford Noble, MSN-Ed, RN,BSN- [facility] [phone]

## 2012-12-01 NOTE — Consult Note (Addendum)
WOC wound consult note Reason for Consult: Cellulitis left leg wound.  Nonhealing wound R AKA amputation site.  Wound type: Trauma wound left leg, Surgical site right AKA site.  Measurement: Left dorsal lower leg.  Dehisced suture site 3.4 cm x 1.3 cm x 0.2cm Right AKA site 4 cm x 5.2 cm x 1 cm Wound bed: left leg, 100% ruddy red at dehiscence Right leg 10% yellow adherent slough, 90% pale pink wound bed, bleeds easily. Drainage (amount, consistency, odor) minimal drainage from left leg, serosanguinous, no odor Right leg, moderate drainage, serous, no odor. Periwound: left leg is indurated and erythematous around suture line.  Right leg periwound is intact Dressing procedure/placement/frequency: Cleansed left leg with NS and gently pat dry.  Apply moistened Aquacel Ag to wound bed, top with dry dressing and change daily.  Right leg- (patient is seen at wound center and prefers hydrocolloid as secondary dressing, currently packing wound with Promogran at wound center and [facility])  Cleanse wound with NS and gently pat dry.  Apply Aquacel Ag to wound bed and top with Hydrocolloid dressing.  Change every 3 days.  Will not follow at this time.  Please re-consult if needed.  Maple Hudson RN BSN CWON Pager [phone]

## 2012-12-01 NOTE — Progress Notes (Signed)
INITIAL NUTRITION ASSESSMENT  DOCUMENTATION CODES Per approved criteria  -Not Applicable   INTERVENTION: Provide Carnation Instant Breakfast BID Provide Ensure Pudding once daily Encourage PO intake >50% of meals  NUTRITION DIAGNOSIS: Inadequate oral intake related to poor appetite as evidenced by pt eating 25% of meals and 3% wt loss.   Goal: Pt to meet >/= 90% of their estimated nutrition needs   Monitor:  PO intake Weight Labs  Reason for Assessment: MST score  67 y.o. female  Admitting Dx: Left leg cellulitis  ASSESSMENT: 67 year old female with past medical history of atrial fibrillation (takes aspirin/pradaxa and amiodarone), GERD, COPD, history of right AKA due to non healing wound infection recently seen in ED (2 days prior to this admission) for left leg laceration and possible infection.  Pt reports having a poor appetite for several months now due to UTI and other infections. She states that she had to force herself to eat PTA but, has been eating even less today. Pt is eating about 25% or less of meals so far. She reports that her usual body weight is 165 lbs.  Height: Ht Readings from Last 1 Encounters:  11/30/12 5\' 11"  (1.803 m)    Weight: Wt Readings from Last 1 Encounters:  12/01/12 161 lb 6 oz (73.2 kg)    Ideal Body Weight: 155 lbs  % Ideal Body Weight: 104%  Wt Readings from Last 10 Encounters:  12/01/12 161 lb 6 oz (73.2 kg)  09/12/12 165 lb (74.844 kg)  09/12/12 165 lb (74.844 kg)  07/17/12 165 lb (74.844 kg)  07/12/12 165 lb (74.844 kg)  07/04/12 180 lb (81.647 kg)  06/23/12 165 lb (74.844 kg)  06/23/12 165 lb (74.844 kg)  06/06/12 180 lb (81.647 kg)  06/06/12 180 lb (81.647 kg)    Usual Body Weight: 165 lbs  % Usual Body Weight: 97%  BMI:  Body mass index is 22.52 kg/(m^2).  Estimated Nutritional Needs: Kcal: 1800-2000 Protein: 90-100 grams Fluid: 1.8- 2 L/day  Skin: +1 LLE edema and LLE wound/abrasion  Diet Order:  General  EDUCATION NEEDS: -No education needs identified at this time   Intake/Output Summary (Last 24 hours) at 12/01/12 1409 Last data filed at 12/01/12 0900  Gross per 24 hour  Intake   1210 ml  Output    800 ml  Net    410 ml    Last BM: 10/22   Labs:   Recent Labs Lab 11/30/12 1730 11/30/12 2240 12/01/12 0524  NA 137 135 138  K 4.2 3.9 3.9  CL 99 98 102  CO2 27 29 29   BUN 8 9 9   CREATININE 0.71 0.88 0.89  CALCIUM 9.4 8.8 8.9  MG  --  1.8  --   PHOS  --  3.8  --   GLUCOSE 77 131* 100*    CBG (last 3)   Recent Labs  12/01/12 0829  GLUCAP 93    Scheduled Meds: . sodium chloride   Intravenous STAT  . amiodarone  200 mg Oral QPM  . aztreonam  1 g Intravenous Q8H  . celecoxib  200 mg Oral QPM  . dabigatran  150 mg Oral Daily  . Fluticasone Furoate-Vilanterol  1 puff Inhalation QHS  . pantoprazole  40 mg Oral Daily  . pyridoxine  100 mg Oral QPM  . sodium chloride  3 mL Intravenous Q12H  . traZODone  50 mg Oral QHS  . vancomycin  750 mg Intravenous Q12H  . ascorbic acid  1,000 mg Oral QHS    Continuous Infusions:   Past Medical History  Diagnosis Date  . Emphysema   . Atrial fibrillation   . COPD (chronic obstructive pulmonary disease)   . History of atrial fibrillation     S/pP Ablation  . Asthma   . Arthritis   . Osteoarthritis   . Rheumatoid arthritis(714.0)   . Anemia   . B12 deficiency anemia   . Stroke     slight stroke in 2009-no deficits  . Neuromuscular disorder     hx of right quadricep rupture  . DJD (degenerative joint disease)   . History of benign bladder tumor   . DVT (deep venous thrombosis)   . Blood transfusion     2009   . Pneumonia     hx of  . GERD (gastroesophageal reflux disease)   . CHF (congestive heart failure)   . Tobacco abuse     Past Surgical History  Procedure Laterality Date  . Replacement total knee    . Cardiac electrophysiology mapping and ablation    . Cholecystectomy  1996  . Cataract  extraction, bilateral    . Hematoma evacuation  2009    From right groin  . Joint replacement      Left-1996, Right-1995  . Multiple tooth extractions  06/30/2010  . Trigger finger release  2011  . Amputation  12/17/2010    Procedure: AMPUTATION ABOVE KNEE;  Surgeon: [name];  Location: [facility];  Service: Orthopedics;  Laterality: Right;  . Application of wound vac  12/17/2010    Procedure: APPLICATION OF WOUND VAC;  Surgeon: [name];  Location: [facility];  Service: Orthopedics;  Laterality: Right;  wound Vac applied @ 2045 By Luna Fuse  . Amputation  04/27/2011    Procedure: AMPUTATION ABOVE KNEE;  Surgeon: [name], MD;  Location: [facility];  Service: Orthopedics;  Laterality: Right;  Revision Right above knee amputation.  . Cardiac catheterization    . Cardioversion      x5 times  . Amputation  02/28/2012    Procedure: AMPUTATION BELOW KNEE;  Surgeon: [name], MD;  Location: [facility];  Service: Orthopedics;  Laterality: Right;  SCAR REVISION  . Tenosynovectomy      left long and ring fingers on 04/19/12  . Amputation Right 06/06/2012    Procedure: REVISION ABOVE RIGHT KNEE AMPUTATION, STUMP ;  Surgeon: [name], MD;  Location: [facility];  Service: Orthopedics;  Laterality: Right;  . I&d extremity Right 06/27/2012    Procedure: IRRIGATION AND DEBRIDEMENT hematoma right AKA;  Surgeon: [name], MD;  Location: [facility];  Service: Orthopedics;  Laterality: Right;  . Irrigation and debridement knee Right 09/12/2012    Procedure: REVISION RIGHT LEG WOUND AND PRIMARY CLOSURE ;  Surgeon: [name], MD;  Location: [facility];  Service: Orthopedics;  Laterality: Right;    Ian Malkin RD, LDN Inpatient Clinical Dietitian Pager: [phone] After Hours Pager: [phone]

## 2012-12-01 NOTE — Progress Notes (Signed)
Pt given COPD GOLD CARD # [id number]. Program explained

## 2012-12-01 NOTE — Evaluation (Signed)
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 Patient was taking clindamycin but there was no significant improvement and she noticed increasing swelling and erythema at the side. This has been worsening since about 1 week prior to this admission after the fall. Patient reported associated fevers and chills and pain on ambulation. Pt with LLE cellulitis.  Clinical Impression  Pt admitted with the above. Pt evaluated by Physical Therapy with no futher acute PT needs identified. All education has been completed and the pt has no further questions. See below for any follow-up PT or equipment needs. Pt states her manual w/c fits through bathroom doorway but she is unable to access toilet/shower with w/c and the L wheel brake does not work properly, which resulted in the current L LE laceration that she was admitted to the hospital for. Pt also reports her power w/c is unable to pass through bathroom doorway.  Pt would benefit from a new manual w/c in order to improve safety and functional mobility at home.  PT is signing off. Thank you for this referral.    PT Assessment  Patent does not need any further PT services    Follow Up Recommendations  No PT follow up    Does the patient have the potential to tolerate intense rehabilitation      Barriers to Discharge        Equipment Recommendations  Wheelchair (measurements PT) (pt would benefit from a new manual w/c to access entire bathroom )    Recommendations  for Other Services     Frequency      Precautions / Restrictions Precautions Precautions: Fall Precaution Comments: Hx of R AKA in 2012. Restrictions Weight Bearing Restrictions: No   Pertinent Vitals/Pain No c/o pain/SOB/dizziness during session.      Mobility  Bed Mobility Bed Mobility: Supine to Sit;Sitting - Scoot to Edge of Bed Supine to Sit: 6: Modified independent (Device/Increase time) Sitting - Scoot to Edge of Bed: 6: Modified independent (Device/Increase time) Details for Bed Mobility Assistance: MOD I due to increased time but pt demonstrates safe technique. Transfers Transfers: Sit to Stand;Stand to Sit;Squat Pivot Transfers Sit to Stand: 5: Supervision;With upper extremity assist;From bed Stand to Sit: 5: Supervision;Other (comment);With armrests;With upper extremity assist (to manual w/c) Squat Pivot Transfers: 5: Supervision;With upper extremity assistance Details for Transfer Assistance: Supervision to ensure safety but pt demonstrated safe and proper technique, so pt progresed to MOD I. Ambulation/Gait Ambulation/Gait Assistance: Not tested (comment) Wheelchair Mobility Wheelchair Mobility: Yes Wheelchair Assistance: 6: Modified independent (Device/Increase time) Occupational hygienist: Both upper extremities;Left lower extremity Wheelchair Parts Management: Independent Distance: pt able to manage w/c parts and propel w/c at MOD I level, as pt able to propel, negotiate objects, and manage tight corners safely and efficiently.    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 Patient was taking clindamycin but there was no significant improvement and she noticed increasing swelling and erythema at the side. This has been worsening since about 1 week prior to this admission after the fall. Patient reported associated fevers and chills and pain on ambulation. Pt with LLE cellulitis.       Prior Functioning  Home Living Family/patient expects to be discharged to:: Private residence Living Arrangements: Alone Available Help at Discharge: Neighbor;Available PRN/intermittently Type of Home: Apartment Home Access: Elevator Home Layout: One level Home Equipment: Wheelchair - power;Bedside commode;Walker - 2 wheels;Wheelchair - manual;Grab bars - tub/shower;Shower seat Additional Comments: pt unable to pass through bathroom door way with power w/c and able to pass through door way with manual w/c but unable to reach toilet/shower with manual w/c. manual w/c L wheel lock does not work properly, so pt unable to fully lock w/c for safe transfer. Prior Function Level of Independence: Independent with assistive device(s) Comments: pt has HH RN that comes 3x/week to assess pt's vitals and medication. 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 RLE Sensation: history of peripheral neuropathy LLE Deficits / Details: WFL LLE Sensation: history of peripheral  neuropathy   Balance    End of Session PT - End of Session Activity Tolerance: Patient tolerated treatment well Patient left: Other (comment);with call bell/phone within reach (in manual w/c) Nurse Communication: Mobility status  GP     Sol Blazing 12/01/2012, 11:20 AM

## 2012-12-02 LAB — CBC
MCH: 36 pg — ABNORMAL HIGH (ref 26.0–34.0)
MCHC: 33.3 g/dL (ref 30.0–36.0)
MCV: 108.1 fL — ABNORMAL HIGH (ref 78.0–100.0)
Platelets: 260 10*3/uL (ref 150–400)
RDW: 14.2 % (ref 11.5–15.5)

## 2012-12-02 LAB — BASIC METABOLIC PANEL
BUN: 10 mg/dL (ref 6–23)
CO2: 26 mEq/L (ref 19–32)
Calcium: 8.6 mg/dL (ref 8.4–10.5)
Creatinine, Ser: 0.63 mg/dL (ref 0.50–1.10)
GFR calc non Af Amer: >90 mL/min (ref >90)
Glucose, Bld: 89 mg/dL (ref 70–99)
Sodium: 138 mEq/L (ref 135–145)

## 2012-12-02 LAB — GLUCOSE, CAPILLARY: Glucose-Capillary: 88 mg/dL (ref 70–99)

## 2012-12-02 MED ORDER — DOXYCYCLINE HYCLATE 100 MG PO TABS: 100.0000 mg | ORAL_TABLET | Freq: Two times a day (BID) | ORAL | Status: DC

## 2012-12-02 NOTE — Progress Notes (Signed)
Pt discharged to home. Left unit in wheelchair pushed by nurse tech accompanied by female friend, who came in to transport patient to home. Left in good condition. Vwilliams,rn.

## 2012-12-02 NOTE — Progress Notes (Signed)
Pt given discharged instructions. No concerns voiced. Pt reminded to stop by pharmacy and pick medication alsready eprescribed by MD. Acknowledged same. Awaiting ride to be transported home. Vwilliams,rn.

## 2012-12-02 NOTE — Plan of Care (Signed)
Problem: Discharge Progression Outcomes Goal: Home Health Care arrangements in place Outcome: Not Applicable Date Met:  12/02/12 Pt already has home health in place

## 2012-12-02 NOTE — Discharge Summary (Signed)
Physician Discharge Summary  [name] ZOX:[id number] DOB: 1945-06-26 DOA: 11/30/2012  PCP: Marylen Ponto, MD  Admit date: 11/30/2012 Discharge date: 12/02/2012  Time spent: 45 minutes  Recommendations for Outpatient Follow-up:  -Will be discharged home today. -HH RN will follow wound.   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 Patient was taking clindamycin but there was no significant improvement and she noticed increasing swelling and erythema at the side. This has been worsening since about 1 week prior to this admission after the fall. Patient reported associated fevers and chills and pain on ambulation.  In ED, vitals are stable with BP 114/43, HR 70 and O2 saturation of 92% on room air. BMP was unremarkable and CBC revealed mild anemia with hemoglobin of 11.7 (recent baseline around 9). X ray of left fibula was negative for acute findings. Pt was started on broad spectrum antibiotics on admission, vanco and zosyn. We were asked to admit her for further evaluation and management.   Hospital Course:   LLE Cellulitis  -Following injury to leg.  -Seems improved: not a large degree of edema or erythema.  -No fever or leukocytosis. -Will transition to PO doxycycline for 14 days. -Patient advised on close follow up given her PVD and h/o  prior amputation. -HH RN will be arranged to care for wound at home. -Has also been advised to follow up with her PCP in 2 weeks.  GERD (gastroesophageal reflux disease)  - continue PPI therapy   COPD (chronic obstructive pulmonary disease)  - stable  - albuterol PRN nebulizer ordered if needed   DVT prophylaxis  - Already on Pradaxa.   Atrial Fibrillation  -Rate controlled.  -On Pradaxa.      Procedures:  None   Consultations:  None  Discharge Instructions  Discharge Orders   Future Orders Complete By Expires   Diet - low sodium heart healthy  As directed    Discontinue IV  As directed    Increase activity slowly  As directed        Medication List    STOP taking these medications       clindamycin 150 MG capsule  Commonly known as:  CLEOCIN      TAKE these medications       amiodarone 200 MG tablet  Commonly known as:  PACERONE  Take 200 mg by mouth every evening.     ascorbic acid 1000 MG tablet  Commonly known as:  VITAMIN C  Take 1,000 mg by mouth at bedtime.     BREO ELLIPTA 100-25 MCG/INH Aepb  Generic drug:  Fluticasone Furoate-Vilanterol  Inhale 1 puff into the lungs at bedtime.     celecoxib 200 MG capsule  Commonly known as:  CELEBREX  Take 200 mg by mouth every evening.     CRANBERRY PO  Take 2  tablets by mouth at bedtime.     dabigatran 150 MG Caps capsule  Commonly known as:  PRADAXA  Take 150 mg by mouth daily.     dimenhyDRINATE 50 MG tablet  Commonly known as:  DRAMAMINE  Take 50 mg by mouth at bedtime as needed (sleep).     diphenhydrAMINE 25 mg capsule  Commonly known as:  BENADRYL  Take 50 mg by mouth at bedtime as needed for itching or sleep.     doxycycline 100 MG tablet  Commonly known as:  VIBRA-TABS  Take 1 tablet (100 mg total) by mouth 2 (two) times daily. For 14 days     HYDROcodone-acetaminophen 7.5-325 MG per tablet  Commonly known as:  NORCO  Take 1-2 tablets by mouth every 4 (four) hours as needed  for pain.     hydroxypropyl methylcellulose 2.5 % ophthalmic solution  Commonly known as:  ISOPTO TEARS  Place 1 drop into both eyes daily as needed (For dry eyes or allergies.). As needed     methocarbamol 500 MG tablet  Commonly known as:  ROBAXIN  Take 1 tablet (500 mg total) by mouth every 6 (six) hours as needed (muscle spasms).     naproxen 500 MG tablet  Commonly known as:  NAPROSYN  Take 1 tablet (500 mg total) by mouth 2 (two) times daily with a meal.     omeprazole 20 MG capsule  Commonly known as:  PRILOSEC  Take 20 mg by mouth at bedtime.     pregabalin 50 MG capsule  Commonly known as:  LYRICA  Take 50 mg by mouth 2 (two) times daily as needed (For pain.).     PROAIR HFA IN  Inhale 2 puffs into the lungs every 6 (six) hours as needed (For shortness of breath.).     pyridoxine 100 MG tablet  Commonly known as:  B-6  Take 100 mg by mouth every evening.     traZODone 50 MG tablet  Commonly known as:  DESYREL  Take 50 mg by mouth at bedtime.       Allergies  Allergen Reactions  . Sulfa Antibiotics Anaphylaxis and Shortness Of Breath  . Adhesive [Tape] Other (See Comments)    Satiny and adhesive tape - blisters skin  . Avelox [Moxifloxacin Hcl In Nacl] Diarrhea and Nausea And Vomiting  . Tizanidine     UTI  . Penicillins Hives and Rash       Follow-up Information   Follow up with HOLT,LYNLEY S, MD. Schedule an appointment as soon as possible for a visit in 2 weeks.   Specialty:  Family Medicine   Contact information:   [address] [place] [address] [phone]        The results of significant diagnostics from this hospitalization (including imaging, microbiology, ancillary and laboratory) are listed below for reference.    Significant Diagnostic Studies: Dg Tibia/fibula Left  11/30/2012   CLINICAL DATA:  Nonhealing wound of left mid anterior lower leg, redness  EXAM: LEFT TIBIA AND FIBULA - 2 VIEW  COMPARISON:  None.  FINDINGS:  Previous left knee arthroplasty noted. Hardware appears intact. Normal alignment. Left tibia and fibula appear intact. No fracture evident. Left lower extremity and ankle soft tissue swelling evident.  IMPRESSION: No acute osseous finding   Electronically Signed   By: Ruel Favors M.D.   On: 11/30/2012 18:14    Microbiology: No results found for this or any previous visit (from the past 240 hour(s)).  Labs: Basic Metabolic Panel:  Recent Labs Lab 11/30/12 1730 11/30/12 2240 12/01/12 0524 12/02/12 0530  NA 137 135 138 138  K 4.2 3.9 3.9 3.7  CL 99 98 102 105  CO2 27 29 29 26   GLUCOSE 77 131* 100* 89  BUN 8 9 9 10   CREATININE 0.71 0.88 0.89 0.63  CALCIUM 9.4 8.8 8.9 8.6  MG  --  1.8  --   --   PHOS  --  3.8  --   --    Liver Function Tests:  Recent Labs Lab 11/30/12 1730 11/30/12 2240 12/01/12 0524  AST 19 13 12   ALT 14 10 10   ALKPHOS 74 65 62  BILITOT 0.3 0.2* 0.2*  PROT 6.4 5.6* 5.3*  ALBUMIN 3.4* 2.9* 2.7*   No results found for this basename: LIPASE, AMYLASE,  in the last 168 hours No results found for this basename: AMMONIA,  in the last 168 hours CBC:  Recent Labs Lab 11/30/12 1730 11/30/12 2240 12/01/12 0524 12/02/12 0530  WBC 9.8 8.2 8.2 7.2  NEUTROABS 5.9 4.7  --   --   HGB 11.7* 10.6* 10.4* 9.8*  HCT 33.3* 30.3* 30.3* 29.4*  MCV 106.1* 106.7* 107.4* 108.1*  PLT 295 265 274 260   Cardiac Enzymes: No results found for this basename: CKTOTAL, CKMB, CKMBINDEX, TROPONINI,  in the last 168 hours BNP: BNP (last 3 results) No results found for this basename: PROBNP,  in the last 8760 hours CBG:  Recent Labs Lab 12/01/12 0829 12/02/12 0751  GLUCAP 93 88       Signed:  [name],Idaly Verret  [facility] Pager: [phone] 12/02/2012, 10:36 AM

## 2012-12-05 NOTE — ED Provider Notes (Signed)
  Medical screening examination/treatment/procedure(s) were conducted as a shared visit with non-physician practitioner(s) and myself.  I personally evaluated the patient during the encounter.  EKG Interpretation     Ventricular Rate:    PR Interval:    QRS Duration:   QT Interval:    QTC Calculation:   R Axis:     Text Interpretation:             67yF developing cellulitis from LLE wound despite abx usage. Hx of amputation to RLE for similar. Admit for further management.   Raeford Razor, MD 12/05/12 1655

## 2013-01-08 ENCOUNTER — Encounter (HOSPITAL_BASED_OUTPATIENT_CLINIC_OR_DEPARTMENT_OTHER): Payer: Medicare Other | Attending: Plastic Surgery

## 2013-01-08 DIAGNOSIS — L97809 Non-pressure chronic ulcer of other part of unspecified lower leg with unspecified severity: Secondary | ICD-10-CM | POA: Insufficient documentation

## 2013-01-08 DIAGNOSIS — I872 Venous insufficiency (chronic) (peripheral): Secondary | ICD-10-CM | POA: Insufficient documentation

## 2013-01-08 DIAGNOSIS — N390 Urinary tract infection, site not specified: Secondary | ICD-10-CM | POA: Insufficient documentation

## 2013-01-09 NOTE — Progress Notes (Signed)
[facility]  NAME:  JONTAE, SONIER          ACCOUNT NO.:  [id number]  MEDICAL RECORD NO.:  [id number]      DATE OF BIRTH:  Jan 17, 1946  PHYSICIAN:  Wayland Denis, DO            VISIT DATE:                                  OFFICE VISIT   The patient is a 67 year old female who is here for evaluation for left lower extremity ulcer.  She has been dealing with this for several weeks, and it does not seem to be getting better.  She has severe peripheral vascular disease for which she has already undergone a right above-knee amputation due to nonhealing wound that was infectious.  She has taken clindamycin with some improvement.  She has been putting dry dressing on this area that is on the anterior aspect of her left leg.  PAST MEDICAL HISTORY:  Positive for chronic obstructive pulmonary disease, gastroesophageal reflux disease, anemia, DVT, atrial fibrillation, coronary artery disease, osteoarthritis, and a cerebrovascular accident.  PAST SURGICAL HISTORY:  Positive for right above-knee amputation, bilateral cataract removal, cholecystectomy, and a left knee replacement.  MEDICATIONS:  Include amiodarone, vitamin C, Celebrex, fluticasone, amiodarone, Pradaxa, Dramamine, Benadryl, Norco, Robaxin, Naprosyn, Lyrica, Prilosec, ProAir HFA inhaler, B6, and trazodone.  ALLERGIES:  Include sulfa, adhesive tape, penicillin, tizanidine, and Avelox.  REVIEW OF SYSTEMS:  She has severe vascular disease and has undergone the amputation.  Her other conditions are being managed by her primary care physician, including recent labs.  PHYSICAL EXAMINATION:  GENERAL:  The patient is alert, oriented, cooperative.  She is known to me.  She seems aware of her condition. She is pleasant. HEENT:  Pupils are equal.  Extraocular muscles are intact.  No cervical lymphadenopathy. CHEST:  Her breathing is unlabored at present. HEART:  Her heart rate is regular.  She does have a  history of atrial fibrillation.  She is in a wheelchair.  The wound is on her left lower extremity.  It had fibrous tissue over the area, was debrided with a curette and had some healthy-looking tissue underneath.  We are going to put Santyl on it today with a Profore Lite and see how she does over the next week and then hopefully we can go with collagen and a wrap.  She has not had vascular studies done recently, so we will order those with prealbumin.  We have encouraged protein intake, elevation, vitamin C, and we will see her back in a week.     Wayland Denis, DO     CS/MEDQ  D:  01/08/2013  T:  01/09/2013  Job:  [id number]

## 2013-01-10 ENCOUNTER — Other Ambulatory Visit (HOSPITAL_COMMUNITY): Payer: Self-pay | Admitting: Plastic Surgery

## 2013-01-10 DIAGNOSIS — I872 Venous insufficiency (chronic) (peripheral): Secondary | ICD-10-CM

## 2013-01-11 ENCOUNTER — Ambulatory Visit (HOSPITAL_COMMUNITY)
Admission: RE | Admit: 2013-01-11 | Discharge: 2013-01-11 | Disposition: A | Discharge status: Home / Self Care | Payer: Medicare Other | Source: Ambulatory Visit | Attending: Cardiovascular Disease | Admitting: Cardiovascular Disease

## 2013-01-11 ENCOUNTER — Other Ambulatory Visit (HOSPITAL_COMMUNITY): Payer: Self-pay | Admitting: Plastic Surgery

## 2013-01-11 DIAGNOSIS — I739 Peripheral vascular disease, unspecified: Secondary | ICD-10-CM

## 2013-01-11 DIAGNOSIS — M7989 Other specified soft tissue disorders: Secondary | ICD-10-CM

## 2013-01-11 DIAGNOSIS — I998 Other disorder of circulatory system: Secondary | ICD-10-CM | POA: Insufficient documentation

## 2013-01-11 DIAGNOSIS — I872 Venous insufficiency (chronic) (peripheral): Secondary | ICD-10-CM | POA: Insufficient documentation

## 2013-01-11 DIAGNOSIS — M79609 Pain in unspecified limb: Secondary | ICD-10-CM

## 2013-01-11 DIAGNOSIS — L97909 Non-pressure chronic ulcer of unspecified part of unspecified lower leg with unspecified severity: Secondary | ICD-10-CM | POA: Insufficient documentation

## 2013-01-11 DIAGNOSIS — S78119A Complete traumatic amputation at level between unspecified hip and knee, initial encounter: Secondary | ICD-10-CM | POA: Insufficient documentation

## 2013-01-11 DIAGNOSIS — L97209 Non-pressure chronic ulcer of unspecified calf with unspecified severity: Secondary | ICD-10-CM | POA: Insufficient documentation

## 2013-01-11 NOTE — Progress Notes (Signed)
Arterial Duplex Left Lower Ext. Completed. Mckynlee Luse, BS, RDMS, RVT  

## 2013-01-11 NOTE — Progress Notes (Signed)
Venous Duplex Left Lower Completed. Marilynne Halsted, BS, RDMS, RVT

## 2013-01-19 NOTE — Progress Notes (Signed)
[facility]  NAME:  [name], [name]               ACCOUNT NO.:  MEDICAL RECORD NO.:  [id number]      DATE OF BIRTH:  [birth year]  PHYSICIAN:  Wayland Denis, DO            VISIT DATE:                                  OFFICE VISIT   The patient is a 67 year old female who is here for followup on her left leg ulcer.  We are still waiting on some of the studies to come back including the pre-albumin and the vascular studies.  She is alert, oriented, cooperative, not in any acute distress.  She complains about pain.  Pupils are equal.  Extraocular muscles are intact.  The wound is slightly improved with a little less drainage.  We are recommending continuing with collagen elevation, multivitamin, vitamin C, zinc, and follow up in a week.     Wayland Denis, DO     CS/MEDQ  D:  01/15/2013  T:  01/16/2013  Job:  [id number]

## 2013-01-24 NOTE — Progress Notes (Signed)
[facility]  NAME:  [name], [name]               ACCOUNT NO.:  MEDICAL RECORD NO.:  [id number]      DATE OF BIRTH:  08-16-1945  PHYSICIAN:  Wayland Denis, DO       VISIT DATE:  01/22/2013                                  OFFICE VISIT   The patient is a 67 year old female who is here for followup on her left lower extremity anterior shin ulcer.  She is doing much better with the collagen.  The pain is a little better.  Her hemoglobin A1c came back at 5.1 and her prealbumin is 17.1.  No change in her medications or social history.  On exam, she is alert, oriented, and cooperative, not in any acute distress.  Pupils are equal.  Extraocular muscles are intact.  Her breathing is unlabored.  Heart rate is regular.  Her pulses are equal.  Her vascular study shows some mild disease.  We will continue with the collagen and elevation, multivitamin, vitamin C, zinc, and Glucerna; and recommend seeing her back in 1 week.     Wayland Denis, DO     CS/MEDQ  D:  01/22/2013  T:  01/23/2013  Job:  [id number]

## 2013-01-30 NOTE — Progress Notes (Signed)
[facility]  NAME:  [name], [name]          ACCOUNT NO.:  [id number]  MEDICAL RECORD NO.:  [id number]      DATE OF BIRTH:  Oct 09, 1945  PHYSICIAN:  Wayland Denis, DO            VISIT DATE:                                  OFFICE VISIT   HISTORY:  The patient is a 67 year old female here for followup on her left leg ulcer chronic venous insufficiency.  She has been using collagen with a little bit of improvement, but not enough as much as we were hoping. There has been no change in her medications or social history.  PHYSICAL EXAMINATION:  On exam, she is alert, oriented, cooperative, not in any acute distress.  She is pleasant.  She is willing to go back for more surgery if needed.  She would like to try a little bit of Santyl on the area which is reasonable.  Debridement was done superficially.  We will recommend continuing with the collagen. We will go ahead with Santyl as well, elevation, multivitamin, vitamin C, zinc, and follow up in a week.     Wayland Denis, DO     CS/MEDQ  D:  01/29/2013  T:  01/30/2013  Job:  [id number]

## 2013-02-06 NOTE — Progress Notes (Signed)
[facility]  NAME:  ZAKIRA, RESSEL               ACCOUNT NO.:  MEDICAL RECORD NO.:  [id number]      DATE OF BIRTH:  1945-08-24  PHYSICIAN:  Wayland Denis, DO       VISIT DATE:  02/05/2013                                  OFFICE VISIT   The patient is a 68 year old female who is here for followup on her left leg chronic ulcer from venous insufficiency.  She also developed a urinary tract infection and has been undergoing treatment for this with a 3rd generation cephalosporin.  This may in fact helped her leg a little bit.  There is a little bit of improvement with the Santyl as well, so we are recommending continuing with that for now and finishing up the antibiotics, elevation, multivitamin, vitamin C, zinc.  These are all very important as well and we will see her back in a week.  If we do not see any improvement, we may need to go back to the OR for more debridement and ACell placement.     Wayland Denis, DO     CS/MEDQ  D:  02/05/2013  T:  02/06/2013  Job:  [id number]

## 2013-02-12 ENCOUNTER — Encounter (HOSPITAL_BASED_OUTPATIENT_CLINIC_OR_DEPARTMENT_OTHER): Payer: Medicare Other | Attending: Plastic Surgery

## 2013-02-12 DIAGNOSIS — L97809 Non-pressure chronic ulcer of other part of unspecified lower leg with unspecified severity: Secondary | ICD-10-CM | POA: Diagnosis not present

## 2013-02-12 DIAGNOSIS — I872 Venous insufficiency (chronic) (peripheral): Secondary | ICD-10-CM | POA: Diagnosis present

## 2013-02-13 NOTE — Progress Notes (Signed)
[facility]  NAME:  [name], [name]          ACCOUNT NO.:  [id number]  MEDICAL RECORD NO.:  [id number]      DATE OF BIRTH:  10/17/1945  PHYSICIAN:  [name], DO       VISIT DATE:  02/12/2013                                  OFFICE VISIT   HISTORY OF PRESENT ILLNESS:  The patient is a 68 year old female who is here for followup on her left leg ulcer.  It has a little bit of progress noted with some improvement, but slow.  She has been using Santyl on the area.  There is no change in her social history or medications.  PHYSICAL EXAMINATION:  GENERAL:  She is alert, oriented, cooperative, not in any acute distress.  She is very pleasant. LUNGS:  Her breathing is unlabored. HEART:  Her heart rate is regular.  We are going to continue with Santyl, but add collagen and get her worked up for the [place] for more debridement and ACell placement, and we will see her back in followup.     [name], DO     CS/MEDQ  D:  02/12/2013  T:  02/13/2013  Job:  [id number]

## 2013-02-19 DIAGNOSIS — L97809 Non-pressure chronic ulcer of other part of unspecified lower leg with unspecified severity: Secondary | ICD-10-CM | POA: Diagnosis not present

## 2013-02-19 DIAGNOSIS — I872 Venous insufficiency (chronic) (peripheral): Secondary | ICD-10-CM | POA: Diagnosis not present

## 2013-02-20 ENCOUNTER — Encounter (HOSPITAL_BASED_OUTPATIENT_CLINIC_OR_DEPARTMENT_OTHER): Payer: Self-pay | Admitting: *Deleted

## 2013-02-20 NOTE — Progress Notes (Signed)
[facility]  NAME:  [name], [name]          ACCOUNT NO.:  [id number]  MEDICAL RECORD NO.:  [id number]      DATE OF BIRTH:  January 24, 1946  PHYSICIAN:  [name], DO       VISIT DATE:  02/19/2013                                  OFFICE VISIT   The patient is a 68 year old female who is here for followup on her left leg ulcer.  There is not a whole lot of change.  She does look like she has just a little bit more swelling and discoloration distally.  She has been up on it a little bit more.  There has been no change in her medications or social history.  Review of systems is otherwise negative.  On exam, she is alert, very cooperative.  She is aware that we have plans to do a debridement with ACell placement next week.  Her pupils are equal.  Her breathing is unlabored.  Her heart rate is regular.  The wound is described in the notes and there is not much change.  We will plan to send cultures and biopsy.  We will continue with Santyl and collagen and plan on ACell placement next Monday and follow up in 2 weeks.     [name], DO     CS/MEDQ  D:  02/19/2013  T:  02/20/2013  Job:  [id number]

## 2013-02-21 ENCOUNTER — Encounter (HOSPITAL_BASED_OUTPATIENT_CLINIC_OR_DEPARTMENT_OTHER): Payer: Self-pay | Admitting: *Deleted

## 2013-02-21 NOTE — Progress Notes (Signed)
PT W/C DEPENDENT BUT CAN TRANSFER SELF.  NPO AFTER WITH EXCEPTION CLEAR LIQUIDS UNTIL 0930 (NO CREAM/ MILK PRODUCTS). ARRIVE AT 1400. NEEDS ISTAT. CURRENT CXR IN EPIC AND CHART. CURRENT EKG, LAST LOV, ECHO, AND STRESS TEST TO BE FAXED FROM DR Loura Back ([facility]). WILL TAKE LYRICA AND DO ADVAIR INHALER AM DOS W/ SIPS OF WATER. WILL BRING PROAIR INHALER. MAY TAKE HYDROCODONE IF NEEDED.

## 2013-02-23 ENCOUNTER — Encounter (HOSPITAL_BASED_OUTPATIENT_CLINIC_OR_DEPARTMENT_OTHER): Payer: Self-pay | Admitting: *Deleted

## 2013-02-26 ENCOUNTER — Encounter (HOSPITAL_BASED_OUTPATIENT_CLINIC_OR_DEPARTMENT_OTHER)
Admission: RE | Disposition: A | Discharge status: Home / Self Care | Payer: Self-pay | Source: Ambulatory Visit | Attending: Plastic Surgery

## 2013-02-26 ENCOUNTER — Encounter (HOSPITAL_BASED_OUTPATIENT_CLINIC_OR_DEPARTMENT_OTHER): Payer: Self-pay | Admitting: Plastic Surgery

## 2013-02-26 ENCOUNTER — Ambulatory Visit (HOSPITAL_BASED_OUTPATIENT_CLINIC_OR_DEPARTMENT_OTHER)
Admission: RE | Admit: 2013-02-26 | Discharge: 2013-02-26 | Disposition: A | Discharge status: Home / Self Care | Payer: PRIVATE HEALTH INSURANCE | Source: Ambulatory Visit | Attending: Plastic Surgery | Admitting: Plastic Surgery

## 2013-02-26 ENCOUNTER — Other Ambulatory Visit: Payer: Self-pay | Admitting: Plastic Surgery

## 2013-02-26 ENCOUNTER — Encounter (HOSPITAL_BASED_OUTPATIENT_CLINIC_OR_DEPARTMENT_OTHER): Payer: PRIVATE HEALTH INSURANCE | Admitting: Anesthesiology

## 2013-02-26 ENCOUNTER — Ambulatory Visit (HOSPITAL_BASED_OUTPATIENT_CLINIC_OR_DEPARTMENT_OTHER): Payer: PRIVATE HEALTH INSURANCE | Admitting: Anesthesiology

## 2013-02-26 DIAGNOSIS — L97909 Non-pressure chronic ulcer of unspecified part of unspecified lower leg with unspecified severity: Secondary | ICD-10-CM | POA: Insufficient documentation

## 2013-02-26 DIAGNOSIS — Z86718 Personal history of other venous thrombosis and embolism: Secondary | ICD-10-CM | POA: Insufficient documentation

## 2013-02-26 DIAGNOSIS — I739 Peripheral vascular disease, unspecified: Secondary | ICD-10-CM | POA: Insufficient documentation

## 2013-02-26 DIAGNOSIS — D649 Anemia, unspecified: Secondary | ICD-10-CM | POA: Insufficient documentation

## 2013-02-26 DIAGNOSIS — M069 Rheumatoid arthritis, unspecified: Secondary | ICD-10-CM | POA: Insufficient documentation

## 2013-02-26 DIAGNOSIS — S78119A Complete traumatic amputation at level between unspecified hip and knee, initial encounter: Secondary | ICD-10-CM | POA: Insufficient documentation

## 2013-02-26 DIAGNOSIS — L97929 Non-pressure chronic ulcer of unspecified part of left lower leg with unspecified severity: Secondary | ICD-10-CM

## 2013-02-26 DIAGNOSIS — Z8673 Personal history of transient ischemic attack (TIA), and cerebral infarction without residual deficits: Secondary | ICD-10-CM | POA: Insufficient documentation

## 2013-02-26 DIAGNOSIS — I872 Venous insufficiency (chronic) (peripheral): Secondary | ICD-10-CM | POA: Insufficient documentation

## 2013-02-26 DIAGNOSIS — Z79899 Other long term (current) drug therapy: Secondary | ICD-10-CM | POA: Insufficient documentation

## 2013-02-26 DIAGNOSIS — K219 Gastro-esophageal reflux disease without esophagitis: Secondary | ICD-10-CM | POA: Insufficient documentation

## 2013-02-26 DIAGNOSIS — J438 Other emphysema: Secondary | ICD-10-CM | POA: Insufficient documentation

## 2013-02-26 HISTORY — DX: Non-pressure chronic ulcer of unspecified part of left lower leg with unspecified severity: L97.929

## 2013-02-26 HISTORY — DX: Personal history of other diseases of the circulatory system: Z98.890

## 2013-02-26 HISTORY — DX: Acquired absence of unspecified leg above knee: Z89.619

## 2013-02-26 HISTORY — DX: Personal history of other venous thrombosis and embolism: Z86.718

## 2013-02-26 HISTORY — DX: Personal history of transient ischemic attack (TIA), and cerebral infarction without residual deficits: Z86.73

## 2013-02-26 HISTORY — DX: Personal history of other diseases of the circulatory system: Z86.79

## 2013-02-26 HISTORY — DX: Emphysema, unspecified: J43.9

## 2013-02-26 HISTORY — DX: Venous insufficiency (chronic) (peripheral): I87.2

## 2013-02-26 HISTORY — DX: Atrioventricular block, first degree: I44.0

## 2013-02-26 HISTORY — DX: Other specified postprocedural states: R11.2

## 2013-02-26 HISTORY — DX: Paroxysmal atrial fibrillation: I48.0

## 2013-02-26 HISTORY — PX: I & D EXTREMITY: SHX5045

## 2013-02-26 LAB — POCT I-STAT 4, (NA,K, GLUC, HGB,HCT)
Glucose, Bld: 88 mg/dL (ref 70–99)
HCT: 39 % (ref 36.0–46.0)
Hemoglobin: 13.3 g/dL (ref 12.0–15.0)
Potassium: 4.1 meq/L (ref 3.7–5.3)
Sodium: 135 meq/L — ABNORMAL LOW (ref 137–147)

## 2013-02-26 SURGERY — IRRIGATION AND DEBRIDEMENT EXTREMITY
Anesthesia: General | Site: Leg Lower | Laterality: Left

## 2013-02-26 MED ORDER — FENTANYL CITRATE 0.05 MG/ML IJ SOLN
INTRAMUSCULAR | Status: DC | PRN
  Administered 2013-02-26: 50 ug via INTRAVENOUS
  Administered 2013-02-26 (×2): 25 ug via INTRAVENOUS

## 2013-02-26 MED ORDER — PROMETHAZINE HCL 25 MG/ML IJ SOLN
6.2500 mg | INTRAMUSCULAR | Status: DC | PRN
  Filled 2013-02-26: qty 1

## 2013-02-26 MED ORDER — FENTANYL CITRATE 0.05 MG/ML IJ SOLN
25.0000 ug | INTRAMUSCULAR | Status: DC | PRN
  Administered 2013-02-26 (×2): 25 ug via INTRAVENOUS
  Filled 2013-02-26: qty 2
  Filled 2013-02-26: qty 1

## 2013-02-26 MED ORDER — KETOROLAC TROMETHAMINE 30 MG/ML IJ SOLN
INTRAMUSCULAR | Status: DC | PRN
  Administered 2013-02-26: 30 mg via INTRAVENOUS

## 2013-02-26 MED ORDER — METOCLOPRAMIDE HCL 5 MG/ML IJ SOLN
INTRAMUSCULAR | Status: DC | PRN
  Administered 2013-02-26: 10 mg via INTRAVENOUS

## 2013-02-26 MED ORDER — LACTATED RINGERS IV SOLN
INTRAVENOUS | Status: DC
  Administered 2013-02-26: 15:00:00 via INTRAVENOUS
  Filled 2013-02-26: qty 1000

## 2013-02-26 MED ORDER — PROPOFOL 10 MG/ML IV BOLUS
INTRAVENOUS | Status: DC | PRN
  Administered 2013-02-26: 150 mg via INTRAVENOUS

## 2013-02-26 MED ORDER — FENTANYL CITRATE 0.05 MG/ML IJ SOLN
INTRAMUSCULAR | Status: AC
  Filled 2013-02-26: qty 4

## 2013-02-26 MED ORDER — DEXAMETHASONE SODIUM PHOSPHATE 4 MG/ML IJ SOLN
INTRAMUSCULAR | Status: DC | PRN
  Administered 2013-02-26: 4 mg via INTRAVENOUS

## 2013-02-26 MED ORDER — EPHEDRINE SULFATE 50 MG/ML IJ SOLN
INTRAMUSCULAR | Status: DC | PRN
  Administered 2013-02-26: 10 mg via INTRAVENOUS

## 2013-02-26 MED ORDER — MEPERIDINE HCL 25 MG/ML IJ SOLN
6.2500 mg | INTRAMUSCULAR | Status: DC | PRN
  Filled 2013-02-26: qty 1

## 2013-02-26 MED ORDER — OXYCODONE HCL 5 MG PO TABS
5.0000 mg | ORAL_TABLET | Freq: Once | ORAL | Status: DC | PRN
  Filled 2013-02-26: qty 1

## 2013-02-26 MED ORDER — LIDOCAINE HCL (CARDIAC) 20 MG/ML IV SOLN
INTRAVENOUS | Status: DC | PRN
  Administered 2013-02-26: 50 mg via INTRAVENOUS

## 2013-02-26 MED ORDER — MIDAZOLAM HCL 2 MG/2ML IJ SOLN
INTRAMUSCULAR | Status: AC
  Filled 2013-02-26: qty 2

## 2013-02-26 MED ORDER — TRAMADOL HCL 50 MG PO TABS
50.0000 mg | ORAL_TABLET | Freq: Four times a day (QID) | ORAL | Status: DC | PRN
  Administered 2013-02-26: 50 mg via ORAL
  Filled 2013-02-26 (×2): qty 1

## 2013-02-26 MED ORDER — POLYMYXIN B SULFATE 500000 UNITS IJ SOLR
INTRAMUSCULAR | Status: DC | PRN
  Administered 2013-02-26: 16:00:00

## 2013-02-26 MED ORDER — LACTATED RINGERS IV SOLN
INTRAVENOUS | Status: DC | PRN
  Administered 2013-02-26: 15:00:00 via INTRAVENOUS

## 2013-02-26 MED ORDER — ONDANSETRON HCL 4 MG/2ML IJ SOLN
INTRAMUSCULAR | Status: DC | PRN
  Administered 2013-02-26: 4 mg via INTRAVENOUS

## 2013-02-26 MED ORDER — HYDROMORPHONE HCL PF 1 MG/ML IJ SOLN
0.2500 mg | INTRAMUSCULAR | Status: DC | PRN
  Filled 2013-02-26: qty 1

## 2013-02-26 MED ORDER — BUPIVACAINE-EPINEPHRINE 0.25% -1:200000 IJ SOLN: INTRAMUSCULAR | Status: DC | PRN

## 2013-02-26 MED ORDER — MIDAZOLAM HCL 5 MG/5ML IJ SOLN
INTRAMUSCULAR | Status: DC | PRN
  Administered 2013-02-26 (×2): 1 mg via INTRAVENOUS

## 2013-02-26 MED ORDER — OXYCODONE HCL 5 MG/5ML PO SOLN
5.0000 mg | Freq: Once | ORAL | Status: DC | PRN
  Filled 2013-02-26: qty 5

## 2013-02-26 SURGICAL SUPPLY — 97 items
BAG DECANTER FOR FLEXI CONT (MISCELLANEOUS) IMPLANT
BANDAGE ELASTIC 3 VELCRO ST LF (GAUZE/BANDAGES/DRESSINGS) ×6 IMPLANT
BANDAGE ELASTIC 4 VELCRO ST LF (GAUZE/BANDAGES/DRESSINGS) IMPLANT
BANDAGE ELASTIC 6 VELCRO ST LF (GAUZE/BANDAGES/DRESSINGS) IMPLANT
BANDAGE GAUZE ELAST BULKY 4 IN (GAUZE/BANDAGES/DRESSINGS) ×3 IMPLANT
BENZOIN TINCTURE PRP APPL 2/3 (GAUZE/BANDAGES/DRESSINGS) IMPLANT
BLADE MINI RND TIP GREEN BEAV (BLADE) IMPLANT
BLADE SURG 10 STRL SS (BLADE) IMPLANT
BLADE SURG 15 STRL LF DISP TIS (BLADE) ×1 IMPLANT
BLADE SURG 15 STRL SS (BLADE) ×2
BNDG COHESIVE 1X5 TAN STRL LF (GAUZE/BANDAGES/DRESSINGS) IMPLANT
BNDG COHESIVE 4X5 TAN STRL (GAUZE/BANDAGES/DRESSINGS) IMPLANT
BNDG ESMARK 4X9 LF (GAUZE/BANDAGES/DRESSINGS) IMPLANT
BNDG GAUZE ELAST 4 BULKY (GAUZE/BANDAGES/DRESSINGS) IMPLANT
CANISTER SUCT LVC 12 LTR MEDI- (MISCELLANEOUS) IMPLANT
CANISTER SUCTION 1200CC (MISCELLANEOUS) IMPLANT
CANISTER SUCTION 2500CC (MISCELLANEOUS) IMPLANT
CHLORAPREP W/TINT 26ML (MISCELLANEOUS) IMPLANT
CLOSURE WOUND 1/2 X4 (GAUZE/BANDAGES/DRESSINGS)
CLOTH BEACON ORANGE TIMEOUT ST (SAFETY) ×3 IMPLANT
CORDS BIPOLAR (ELECTRODE) IMPLANT
COVER MAYO STAND STRL (DRAPES) ×3 IMPLANT
COVER TABLE BACK 60X90 (DRAPES) ×3 IMPLANT
DECANTER SPIKE VIAL GLASS SM (MISCELLANEOUS) IMPLANT
DRAIN PENROSE 18X1/2 LTX STRL (DRAIN) ×3 IMPLANT
DRAPE EXTREMITY T 121X128X90 (DRAPE) IMPLANT
DRAPE INCISE IOBAN 66X45 STRL (DRAPES) IMPLANT
DRAPE LG THREE QUARTER DISP (DRAPES) IMPLANT
DRSG EMULSION OIL 3X3 NADH (GAUZE/BANDAGES/DRESSINGS) ×3 IMPLANT
ELECT NEEDLE BLADE 2-5/6 (NEEDLE) IMPLANT
ELECT NEEDLE TIP 2.8 STRL (NEEDLE) IMPLANT
ELECT REM PT RETURN 9FT ADLT (ELECTROSURGICAL) ×3
ELECTRODE REM PT RTRN 9FT ADLT (ELECTROSURGICAL) ×1 IMPLANT
GAUZE SPONGE 4X4 12PLY STRL LF (GAUZE/BANDAGES/DRESSINGS) IMPLANT
GAUZE XEROFORM 1X8 LF (GAUZE/BANDAGES/DRESSINGS) IMPLANT
GAUZE XEROFORM 5X9 LF (GAUZE/BANDAGES/DRESSINGS) IMPLANT
GLOVE BIO SURGEON STRL SZ 6.5 (GLOVE) ×2 IMPLANT
GLOVE BIO SURGEONS STRL SZ 6.5 (GLOVE) ×1
GLOVE BIOGEL M 6.5 STRL (GLOVE) ×3 IMPLANT
GLOVE BIOGEL M STER SZ 6 (GLOVE) ×3 IMPLANT
GLOVE BIOGEL PI IND STRL 6.5 (GLOVE) ×2 IMPLANT
GLOVE BIOGEL PI INDICATOR 6.5 (GLOVE) ×4
GOWN PREVENTION PLUS XLARGE (GOWN DISPOSABLE) ×3 IMPLANT
GOWN STRL REUS W/TWL LRG LVL3 (GOWN DISPOSABLE) ×6 IMPLANT
GOWN STRL REUS W/TWL XL LVL3 (GOWN DISPOSABLE) ×3 IMPLANT
HANDPIECE INTERPULSE COAX TIP (DISPOSABLE)
IV NS IRRIG 3000ML ARTHROMATIC (IV SOLUTION) IMPLANT
MATRIX SURGICAL PSM 7X10CM (Tissue) ×3 IMPLANT
MICROMATRIX 500MG (Tissue) ×3 IMPLANT
NEEDLE 27GAX1X1/2 (NEEDLE) IMPLANT
NEEDLE HYPO 30GX1 BEV (NEEDLE) IMPLANT
NS IRRIG 1000ML POUR BTL (IV SOLUTION) ×3 IMPLANT
PACK BASIN DAY SURGERY FS (CUSTOM PROCEDURE TRAY) ×3 IMPLANT
PAD ABD 8X10 STRL (GAUZE/BANDAGES/DRESSINGS) IMPLANT
PADDING CAST ABS 3INX4YD NS (CAST SUPPLIES)
PADDING CAST ABS 4INX4YD NS (CAST SUPPLIES)
PADDING CAST ABS COTTON 3X4 (CAST SUPPLIES) IMPLANT
PADDING CAST ABS COTTON 4X4 ST (CAST SUPPLIES) IMPLANT
PENCIL BUTTON HOLSTER BLD 10FT (ELECTRODE) IMPLANT
SET HNDPC FAN SPRY TIP SCT (DISPOSABLE) IMPLANT
SLEEVE SCD COMPRESS KNEE MED (MISCELLANEOUS) IMPLANT
SOLUTION PARTIC MCRMTRX 500MG (Tissue) ×1 IMPLANT
SPLINT PLASTER CAST XFAST 3X15 (CAST SUPPLIES) IMPLANT
SPLINT PLASTER XTRA FASTSET 3X (CAST SUPPLIES)
SPONGE GAUZE 4X4 12PLY (GAUZE/BANDAGES/DRESSINGS) ×3 IMPLANT
SPONGE LAP 18X18 X RAY DECT (DISPOSABLE) IMPLANT
SPONGE LAP 4X18 X RAY DECT (DISPOSABLE) IMPLANT
STAPLER VISISTAT 35W (STAPLE) IMPLANT
STOCKINETTE 4X48 STRL (DRAPES) IMPLANT
STOCKINETTE 6  STRL (DRAPES)
STOCKINETTE 6 STRL (DRAPES) IMPLANT
STOCKINETTE IMPERVIOUS LG (DRAPES) IMPLANT
STRIP CLOSURE SKIN 1/2X4 (GAUZE/BANDAGES/DRESSINGS) IMPLANT
SUCTION FRAZIER TIP 10 FR DISP (SUCTIONS) IMPLANT
SURGILUBE 2OZ TUBE FLIPTOP (MISCELLANEOUS) IMPLANT
SUT ETHILON 3 0 PS 1 (SUTURE) IMPLANT
SUT ETHILON 4 0 P 3 18 (SUTURE) IMPLANT
SUT ETHILON 5 0 PS 2 18 (SUTURE) IMPLANT
SUT PROLENE 3 0 PS 2 (SUTURE) IMPLANT
SUT SILK 3 0 PS 1 (SUTURE) IMPLANT
SUT VIC AB 3-0 FS2 27 (SUTURE) IMPLANT
SUT VIC AB 5-0 P-3 18X BRD (SUTURE) IMPLANT
SUT VIC AB 5-0 P3 18 (SUTURE)
SUT VIC AB 5-0 PS2 18 (SUTURE) ×3 IMPLANT
SYR BULB IRRIGATION 50ML (SYRINGE) IMPLANT
SYR CONTROL 10ML LL (SYRINGE) ×3 IMPLANT
TAPE HYPAFIX 6 X30' (GAUZE/BANDAGES/DRESSINGS)
TAPE HYPAFIX 6X30 (GAUZE/BANDAGES/DRESSINGS) IMPLANT
TIP FLEX 45CM EVICEL (HEMOSTASIS) IMPLANT
TIP RIGID 35CM EVICEL (HEMOSTASIS) IMPLANT
TOWEL OR 17X24 6PK STRL BLUE (TOWEL DISPOSABLE) ×3 IMPLANT
TRAY DSU PREP LF (CUSTOM PROCEDURE TRAY) IMPLANT
TUBE CONNECTING 12'X1/4 (SUCTIONS)
TUBE CONNECTING 12X1/4 (SUCTIONS) IMPLANT
UNDERPAD 30X30 INCONTINENT (UNDERPADS AND DIAPERS) ×3 IMPLANT
WATER STERILE IRR 1000ML POUR (IV SOLUTION) ×3 IMPLANT
YANKAUER SUCT BULB TIP NO VENT (SUCTIONS) IMPLANT

## 2013-02-26 NOTE — Anesthesia Procedure Notes (Signed)
Procedure Name: LMA Insertion Date/Time: 02/26/2013 4:11 PM Performed by: Justice Rocher Pre-anesthesia Checklist: Patient identified, Emergency Drugs available, Suction available and Patient being monitored Patient Re-evaluated:Patient Re-evaluated prior to inductionOxygen Delivery Method: Circle System Utilized Preoxygenation: Pre-oxygenation with 100% oxygen Intubation Type: IV induction Ventilation: Mask ventilation without difficulty LMA: LMA inserted LMA Size: 4.0 Number of attempts: 1 Airway Equipment and Method: bite block Placement Confirmation: positive ETCO2 Tube secured with: Tape Dental Injury: Teeth and Oropharynx as per pre-operative assessment

## 2013-02-26 NOTE — Brief Op Note (Signed)
02/26/2013  4:42 PM  PATIENT:  [name]  68 y.o. female  PRE-OPERATIVE DIAGNOSIS:  LEFT LEG ULCER  POST-OPERATIVE DIAGNOSIS:  LEFT LEG ULCER  PROCEDURE:  Procedure(s): IRRIGATION AND DEBRIDEMENT OF LEFT LEG WITH PLACEMENT OF ACELL (Left)  SURGEON:  Surgeon(s) and Role:    * Lavada Langsam Sanger, DO - Primary  PHYSICIAN ASSISTANT: none  ASSISTANTS: none   ANESTHESIA:   general  EBL:  Total I/O In: 100 [I.V.:100] Out: -   BLOOD ADMINISTERED:none  DRAINS: none   LOCAL MEDICATIONS USED:  NONE  SPECIMEN:  No Specimen  DISPOSITION OF SPECIMEN:  N/A  COUNTS:  YES  TOURNIQUET:  * No tourniquets in log *  DICTATION: .Dragon Dictation  PLAN OF CARE: Discharge to home after PACU  PATIENT DISPOSITION:  PACU - hemodynamically stable.   Delay start of Pharmacological VTE agent (>24hrs) due to surgical blood loss or risk of bleeding: no

## 2013-02-26 NOTE — Anesthesia Preprocedure Evaluation (Addendum)
Anesthesia Evaluation  Patient identified by MRN, date of birth, ID band Patient awake    Reviewed: Allergy & Precautions, H&P , NPO status , Patient's Chart, lab work & pertinent test results  History of Anesthesia Complications (+) PONV and history of anesthetic complications  Airway Mallampati: II TM Distance: >3 FB Neck ROM: full    Dental no notable dental hx. (+) Dental Advisory Given   Pulmonary asthma , pneumonia -, resolved, COPD COPD inhaler, former smoker,  breath sounds clear to auscultation Took inhaler last pm. Does not feel that she wants a nebulizer treatment at this time.  + wheezing      Cardiovascular + Peripheral Vascular Disease and +CHF + dysrhythmias Atrial Fibrillation Rhythm:regular Rate:Normal  ECG: NSR, atrial fibrillation resolved (02/21/12). First degree AV block.  Echo 12/2010 Study Conclusions  - Left ventricle: The cavity size was normal. Wall thickness  was normal. Systolic function was normal. The estimated ejection fraction was in the range of 60% to 65%. - Left atrium: The atrium was mildly dilated. - Right ventricle: The cavity size was moderately dilated. - Right atrium: The atrium was mildly dilated. - Pulmonary arteries: PA peak pressure: 35mm Hg (S). - Impressions: Primary finding is right sided cardiac   enlargement   Impressions:  Primary finding is right sided cardiac enlargement    Neuro/Psych TIA Neuromuscular disease negative psych ROS   GI/Hepatic Neg liver ROS, GERD-  ,  Endo/Other  negative endocrine ROS  Renal/GU negative Renal ROS     Musculoskeletal  (+) Arthritis -, Rheumatoid disorders,    Abdominal   Peds  Hematology  (+) Blood dyscrasia, anemia ,   Anesthesia Other Findings   Reproductive/Obstetrics                          Anesthesia Physical  Anesthesia Plan  ASA: III  Anesthesia Plan: General   Post-op Pain Management:     Induction: Intravenous  Airway Management Planned: LMA  Additional Equipment:   Intra-op Plan:   Post-operative Plan: Extubation in OR  Informed Consent: I have reviewed the patients History and Physical, chart, labs and discussed the procedure including the risks, benefits and alternatives for the proposed anesthesia with the patient or authorized representative who has indicated his/her understanding and acceptance.   Dental advisory given  Plan Discussed with: CRNA  Anesthesia Plan Comments:        Anesthesia Quick Evaluation

## 2013-02-26 NOTE — Interval H&P Note (Signed)
History and Physical Interval Note:  02/26/2013 3:55 PM  [name]  has presented today for surgery, with the diagnosis of LEFT LEG ULCER  The various methods of treatment have been discussed with the patient and family. After consideration of risks, benefits and other options for treatment, the patient has consented to  Procedure(s): IRRIGATION AND DEBRIDEMENT OF LEFT LEG WITH PLACEMENT OF ACELL (Left) as a surgical intervention .  The patient's history has been reviewed, patient examined, no change in status, stable for surgery.  I have reviewed the patient's chart and labs.  Questions were answered to the patient's satisfaction.     SANGER,CLAIRE

## 2013-02-26 NOTE — Interval H&P Note (Signed)
History and Physical Interval Note:  02/26/2013 2:12 PM  [name]  has presented today for surgery, with the diagnosis of LEFT LEG ULCER  The various methods of treatment have been discussed with the patient and family. After consideration of risks, benefits and other options for treatment, the patient has consented to  Procedure(s): IRRIGATION AND DEBRIDEMENT OF LEFT LEG WITH PLACEMENT OF ACELL (Left) as a surgical intervention .  The patient's history has been reviewed, patient examined, no change in status, stable for surgery.  I have reviewed the patient's chart and labs.  Questions were answered to the patient's satisfaction.     SANGER,CLAIRE

## 2013-02-26 NOTE — H&P (View-Only) (Signed)
[facility]  NAME:  [name], [name]          ACCOUNT NO.:  [id number]  MEDICAL RECORD NO.:  [id number]      DATE OF BIRTH:  1945/09/13  PHYSICIAN:  [name], DO       VISIT DATE:  02/19/2013                                  OFFICE VISIT   The patient is a 68 year old female who is here for followup on her left leg ulcer.  There is not a whole lot of change.  She does look like she has just a little bit more swelling and discoloration distally.  She has been up on it a little bit more.  There has been no change in her medications or social history.  Review of systems is otherwise negative.  On exam, she is alert, very cooperative.  She is aware that we have plans to do a debridement with ACell placement next week.  Her pupils are equal.  Her breathing is unlabored.  Her heart rate is regular.  The wound is described in the notes and there is not much change.  We will plan to send cultures and biopsy.  We will continue with Santyl and collagen and plan on ACell placement next Monday and follow up in 2 weeks.     [name], DO     CS/MEDQ  D:  02/19/2013  T:  02/20/2013  Job:  [id number]

## 2013-02-26 NOTE — Discharge Instructions (Signed)
Apply KY gel to the wound daily starting on Wednesday  Post Anesthesia Home Care Instructions  Activity: Get plenty of rest for the remainder of the day. A responsible adult should stay with you for 24 hours following the procedure.  For the next 24 hours, DO NOT: -Drive a car -Paediatric nurse -Drink alcoholic beverages -Take any medication unless instructed by your physician -Make any legal decisions or sign important papers.  Meals: Start with liquid foods such as gelatin or soup. Progress to regular foods as tolerated. Avoid greasy, spicy, heavy foods. If nausea and/or vomiting occur, drink only clear liquids until the nausea and/or vomiting subsides. Call your physician if vomiting continues.  Special Instructions/Symptoms: Your throat may feel dry or sore from the anesthesia or the breathing tube placed in your throat during surgery. If this causes discomfort, gargle with warm salt water. The discomfort should disappear within 24 hours.  Call your surgeon if you experience:   1.  Fever over 101.0. 2.  Inability to urinate. 3.  Nausea and/or vomiting. 4.  Extreme swelling or bruising at the surgical site. 5.  Continued bleeding from the incision. 6.  Increased pain, redness or drainage from the incision. 7.  Problems related to your pain medication.

## 2013-02-26 NOTE — Transfer of Care (Signed)
Immediate Anesthesia Transfer of Care Note  Patient: [name]  Procedure(s) Performed: Procedure(s) (LRB): IRRIGATION AND DEBRIDEMENT OF LEFT LEG WITH PLACEMENT OF ACELL (Left)  Patient Location: PACU  Anesthesia Type: General  Level of Consciousness: awake, sedated, patient cooperative and responds to stimulation  Airway & Oxygen Therapy: Patient Spontanous Breathing and Patient connected to face mask oxygen  Post-op Assessment: Report given to PACU RN, Post -op Vital signs reviewed and stable and Patient moving all extremities  Post vital signs: Reviewed and stable  Complications: No apparent anesthesia complications

## 2013-02-26 NOTE — H&P (Signed)
[name] is an 68 y.o. female.   Chief Complaint: left leg ulcer HPI: The patient is a 68 yrs old wf here for treatment of her left leg ulcer.  She has been undergoing dressing changes in the office.  The healing has been very slow and there is concern for a chronic situation and infection if it remains open.  We made the decision to come to the OR for irrigation and debridement with possible placement of Acell.   Past Medical History  Diagnosis Date  . Asthma   . GERD (gastroesophageal reflux disease)   . COPD (chronic obstructive pulmonary disease) with emphysema   . History of TIA (transient ischemic attack)     PER MRI 2009-- NO RESIDUAL  . Osteoarthritis   . Rheumatoid arthritis(714.0)   . DJD (degenerative joint disease)   . B12 deficiency anemia   . History of DVT of lower extremity     RIGHT LOWER LEG THEN MOVED TO GOIN S/P REMOVAL  . S/P AKA (above knee amputation) unilateral     RIGHT 12-17-2010  . Ulcer of left lower leg   . PAF (paroxysmal atrial fibrillation)   . S/P ablation of atrial fibrillation   . First degree heart block   . History of CHF (congestive heart failure)   . PONV (postoperative nausea and vomiting)   . Chronic venous insufficiency     Past Surgical History  Procedure Laterality Date  . Cardiac electrophysiology mapping and ablation  APRIL 2009    A-FIB ABLATION AND TEE ( SLIGHTLY DILATED ATRIA, NORMAL LV FUNCTION AND VALVES)  . Hematoma evacuation  2009    From right groin  . Trigger finger release  2011  . Amputation  12/17/2010    Procedure: AMPUTATION ABOVE KNEE;  Surgeon: Mauri Pole;  Location: [facility];  Service: Orthopedics;  Laterality: Right;  . Application of wound vac  12/17/2010    Procedure: APPLICATION OF WOUND VAC;  Surgeon: Mauri Pole;  Location: [facility];  Service: Orthopedics;  Laterality: Right;  wound Vac applied @ 2045 By Salli Quarry  . Amputation  04/27/2011    Procedure: AMPUTATION ABOVE KNEE;  Surgeon: Mauri Pole, MD;  Location: [facility];  Service: Orthopedics;  Laterality: Right;  Revision Right above knee amputation.  . Cardioversion  10-22-2011   [facility]    x5 times  . Amputation  02/28/2012    Procedure: AMPUTATION BELOW KNEE;  Surgeon: Mauri Pole, MD;  Location: [facility];  Service: Orthopedics;  Laterality: Right;  SCAR REVISION  . Tenosynovectomy  04-19-2012    left long and ring fingers   . Amputation Right 06/06/2012    Procedure: REVISION ABOVE RIGHT KNEE AMPUTATION, STUMP ;  Surgeon: Mauri Pole, MD;  Location: [facility];  Service: Orthopedics;  Laterality: Right;  . I&d extremity Right 06/27/2012    Procedure: IRRIGATION AND DEBRIDEMENT hematoma right AKA;  Surgeon: Mauri Pole, MD;  Location: [facility];  Service: Orthopedics;  Laterality: Right;  . Irrigation and debridement knee Right 09/12/2012    Procedure: REVISION RIGHT LEG WOUND AND PRIMARY CLOSURE ;  Surgeon: Mauri Pole, MD;  Location: [facility];  Service: Orthopedics;  Laterality: Right;  . Total knee arthroplasty Bilateral RIGHT 1996/  LEFT 1997  . Revision total knee arthroplasty Right 02-04-2010  . Cysto/ bladder bx  02-26-2003  . Resection right total knee / i & d/  placement antibiotic spacer  04-23-2010  06-23-2010--REPEAT I & D REPLACEMENT ANTIBIOTIC SPACER/   AND MULTIPLE TEETH EXTRACTIONS WITH BILATERAL MANDIBULAR REDUCTION  . Removal antibiotic spacer/  reimplantation right total knee  09-07-2010  . Open repair quadriceps tendon w/ allograft Right 12-08-2010  . Cataract extraction w/ intraocular lens  implant, bilateral    . Cholecystectomy  1996  . Transthoracic echocardiogram  12-16-2010    LVEF  60-65%/  MILD DILATED LA  &  RA/  MODERATELY DILATED RV  . Vascular surgery      REPAIR CFA  . Cardiac catheterization  2006  DR ZAN TYSON    NO CAD (PER CARDIOLOGIST NOTE , DR Elonda Husky)    Family History  Problem Relation Age of Onset  . Achondroplasia Father   . Congenital heart disease Mother   .  Hypertension Mother   . Hypertension Sister    Social History:  reports that she quit smoking about 3 years ago. She has never used smokeless tobacco. She reports that she does not drink alcohol or use illicit drugs.  Allergies:  Allergies  Allergen Reactions  . Sulfa Antibiotics Anaphylaxis and Shortness Of Breath  . Adhesive [Tape] Other (See Comments)    Satiny and adhesive tape - blisters skin  . Avelox [Moxifloxacin Hcl In Nacl] Diarrhea and Nausea And Vomiting  . Penicillins Hives and Rash    Medications Prior to Admission  Medication Sig Dispense Refill  . Albuterol Sulfate (PROAIR HFA IN) Inhale 2 puffs into the lungs every 6 (six) hours as needed (For shortness of breath.).       Marland Kitchen amiodarone (PACERONE) 200 MG tablet Take 200 mg by mouth every evening.       Marland Kitchen ascorbic acid (VITAMIN C) 1000 MG tablet Take 1,000 mg by mouth at bedtime.      . celecoxib (CELEBREX) 200 MG capsule Take 200 mg by mouth 2 (two) times daily.       Marland Kitchen CRANBERRY PO Take 2 tablets by mouth at bedtime.      . dabigatran (PRADAXA) 150 MG CAPS Take 150 mg by mouth 2 (two) times daily.       Marland Kitchen dimenhyDRINATE (DRAMAMINE) 50 MG tablet Take 50 mg by mouth at bedtime as needed (sleep).      . diphenhydrAMINE (BENADRYL) 25 mg capsule Take 50 mg by mouth at bedtime as needed for itching or sleep.      Marland Kitchen Fluticasone-Salmeterol (ADVAIR DISKUS) 100-50 MCG/DOSE AEPB Inhale 1 puff into the lungs 2 (two) times daily.      Marland Kitchen HYDROcodone-acetaminophen (NORCO) 7.5-325 MG per tablet Take 1-2 tablets by mouth every 4 (four) hours as needed for pain.  80 tablet  0  . hydroxypropyl methylcellulose (ISOPTO TEARS) 2.5 % ophthalmic solution Place 1 drop into both eyes daily as needed (For dry eyes or allergies.). As needed      . methocarbamol (ROBAXIN) 500 MG tablet Take 1 tablet (500 mg total) by mouth every 6 (six) hours as needed (muscle spasms).  50 tablet  0  . naproxen (NAPROSYN) 500 MG tablet Take 1 tablet (500 mg total) by  mouth 2 (two) times daily with a meal.  30 tablet  0  . omeprazole (PRILOSEC) 20 MG capsule Take 20 mg by mouth at bedtime.      . pregabalin (LYRICA) 50 MG capsule Take 50 mg by mouth 2 (two) times daily.       Marland Kitchen pyridoxine (B-6) 100 MG tablet Take 100 mg by mouth every evening.       Marland Kitchen  traZODone (DESYREL) 50 MG tablet Take 50 mg by mouth at bedtime.        No results found for this or any previous visit (from the past 48 hour(s)). No results found.  Review of Systems  Constitutional: Negative.   HENT: Negative.   Eyes: Negative.   Respiratory: Negative.   Cardiovascular: Negative.   Gastrointestinal: Negative.   Genitourinary: Negative.   Musculoskeletal: Negative.   Skin: Negative.   Neurological: Negative.   Psychiatric/Behavioral: Negative.     There were no vitals taken for this visit. Physical Exam  Constitutional: She appears well-developed and well-nourished.  HENT:  Head: Normocephalic and atraumatic.  Eyes: Conjunctivae and EOM are normal. Pupils are equal, round, and reactive to light.  Cardiovascular: Normal rate.   Respiratory: Effort normal.  GI: Soft.  Musculoskeletal: Normal range of motion.  Neurological: She is alert.  Skin: Skin is warm.  Psychiatric: She has a normal mood and affect. Her behavior is normal. Judgment and thought content normal.     Assessment/Plan We made the decision to come to the OR for irrigation and debridement with possible placement of Acell to left leg ulcer.  SANGER,CLAIRE 02/26/2013, 2:13 PM

## 2013-02-26 NOTE — Op Note (Signed)
Operative Note   DATE OF OPERATION: 02/26/2013  LOCATION: [facility]  SURGICAL DIVISION: Plastic Surgery  PREOPERATIVE DIAGNOSES:  Left leg venous insufficiency ulcer  POSTOPERATIVE DIAGNOSES:  same  PROCEDURE:  Preparation of left leg venous ulcer with debridement of skin and subcutaneous tissue for purpose of placement of Acell powder and Sheet (5 x 3 cm )  SURGEON: [name], DO  ASSISTANT: Shawn Rayburn, PA  ANESTHESIA:  General.   COMPLICATIONS: None.   INDICATIONS FOR PROCEDURE:  The patient, [name], is a 68 y.o. female born on September 22, 1945, is here for treatment of left leg ulcer   CONSENT:  Informed consent was obtained directly from the patient. Risks, benefits and alternatives were fully discussed. Specific risks including but not limited to bleeding, infection, hematoma, seroma, scarring, pain, infection, asymmetry, wound healing problems, and need for further surgery were all discussed. The patient did have an ample opportunity to have questions answered to satisfaction.   DESCRIPTION OF PROCEDURE:  The patient was taken to the operating room. The patient's operative site was prepped and draped in a sterile fashion. A time out was performed and all information was confirmed to be correct.  General anesthesia was administered.  The #10 blade was used to debride the nonviable skin and subcutaneous tissue.  Hemostasis was achieved with electrocautery.  The Acell powder and sheet were prepared according to the manufacture guidelines and placed on the ulcer.  The sheet was secured with 5-0 Vicryl.  Adaptic was applied with KY gel and a kerlex and Ace wrap.  The patient tolerated the procedure well.  There were no complications. The patient was allowed to wake from anesthesia, extubated and taken to the recovery room in satisfactory condition.

## 2013-02-26 NOTE — Anesthesia Postprocedure Evaluation (Signed)
  Anesthesia Post-op Note  Patient: [name]  Procedure(s) Performed: Procedure(s) (LRB): IRRIGATION AND DEBRIDEMENT OF LEFT LEG WITH PLACEMENT OF ACELL (Left)  Patient Location: PACU  Anesthesia Type: General  Level of Consciousness: awake and alert   Airway and Oxygen Therapy: Patient Spontanous Breathing  Post-op Pain: mild  Post-op Assessment: Post-op Vital signs reviewed, Patient's Cardiovascular Status Stable, Respiratory Function Stable, Patent Airway and No signs of Nausea or vomiting  Last Vitals:  Filed Vitals:   02/26/13 1730  BP: 109/49  Pulse: 68  Temp:   Resp: 11    Post-op Vital Signs: stable   Complications: No apparent anesthesia complications

## 2013-02-28 ENCOUNTER — Encounter (HOSPITAL_BASED_OUTPATIENT_CLINIC_OR_DEPARTMENT_OTHER): Payer: Self-pay | Admitting: Plastic Surgery

## 2013-03-05 DIAGNOSIS — I872 Venous insufficiency (chronic) (peripheral): Secondary | ICD-10-CM | POA: Diagnosis not present

## 2013-03-06 NOTE — Progress Notes (Signed)
[facility]  NAME:  [name], [name]          ACCOUNT NO.:  [id number]  MEDICAL RECORD NO.:  [id number]      DATE OF BIRTH:  1945-08-28  PHYSICIAN:  [name], DO       VISIT DATE:  03/05/2013                                  OFFICE VISIT   The patient is a 68 year old female who is here for followup on her left lower extremity ulcer, chronic venous insufficiency.  She underwent debridement in the OR with __________ placement.  So, she is here today postoperatively.  The Adaptic is in place.  I am going to leave it. Everything looks good.  There is no foul odor.  No drainage.  She needs to continue with the KY gel and wrap every other day, and we will see her back in a week.     [name], DO     CS/MEDQ  D:  03/05/2013  T:  03/06/2013  Job:  [id number]

## 2013-03-12 ENCOUNTER — Encounter (HOSPITAL_BASED_OUTPATIENT_CLINIC_OR_DEPARTMENT_OTHER): Payer: PRIVATE HEALTH INSURANCE | Attending: Plastic Surgery

## 2013-03-12 DIAGNOSIS — L97809 Non-pressure chronic ulcer of other part of unspecified lower leg with unspecified severity: Secondary | ICD-10-CM | POA: Insufficient documentation

## 2013-03-19 ENCOUNTER — Ambulatory Visit: Payer: Medicare Other | Admitting: Physical Therapy

## 2013-03-19 DIAGNOSIS — L97809 Non-pressure chronic ulcer of other part of unspecified lower leg with unspecified severity: Secondary | ICD-10-CM | POA: Diagnosis not present

## 2013-03-26 DIAGNOSIS — L97809 Non-pressure chronic ulcer of other part of unspecified lower leg with unspecified severity: Secondary | ICD-10-CM | POA: Diagnosis not present

## 2013-03-27 NOTE — Progress Notes (Signed)
[facility]  NAME:  [name], [name]          ACCOUNT NO.:  [id number]  MEDICAL RECORD NO.:  [id number]      DATE OF BIRTH:  [birth year]  PHYSICIAN:  [name], DO       VISIT DATE:  03/26/2013                                  OFFICE VISIT   The patient is a 68 year old female who is here for followup on her left leg chronic ulcer.  Overall, it is filling in and her leg is looking much better.  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  There is no sign of infection.  On exam, she is alert, oriented, cooperative, very pleasant as usual. Her breathing is unlabored.  Her heart rate is regular.  We will do the silver collagen apply for oasis and see her back in week. In the meantime, she is to continue with elevation and compression.     [name], DO     CS/MEDQ  D:  03/26/2013  T:  03/27/2013  Job:  [id number]

## 2013-04-02 DIAGNOSIS — L97809 Non-pressure chronic ulcer of other part of unspecified lower leg with unspecified severity: Secondary | ICD-10-CM | POA: Diagnosis not present

## 2013-04-03 NOTE — Progress Notes (Signed)
[facility]  NAME:  [name], [name]          ACCOUNT NO.:  [id number]  MEDICAL RECORD NO.:  [id number]      DATE OF BIRTH:  1945/12/06  PHYSICIAN:  [name], DO       VISIT DATE:  04/02/2013                                  OFFICE VISIT   The patient is a 68 year old female, who is here for followup on her left leg chronic venous and arterial ulcer.  She did have vascular studies done in December and I am going to check with Dr. Alvester Chou again on that. We may need to repeat them.  We will also check prealbumin.  There is no change in her medications or social history.  Review of systems otherwise negative.  On exam, she is alert, very pleasant, very cooperative.  Her breathing is unlabored and her heart rate is regular.  Her leg swelling is markedly improved.  Pulse weak, but present.  The ulcer is slightly improved, but not markedly.  It  does not appear to be infected.  The depth is better, but the overall size has not changed significantly. We will continue with the silver collagen, check a pre-albumin, recheck vascular studies with Dr. Alvester Chou, elevation, multivitamin, vitamin C, and zinc, and we will see her back in a week.     [name], DO     CS/MEDQ  D:  04/02/2013  T:  04/03/2013  Job:  [id number]

## 2013-04-04 ENCOUNTER — Ambulatory Visit: Payer: PRIVATE HEALTH INSURANCE | Attending: Orthopedic Surgery | Admitting: Physical Therapy

## 2013-04-04 DIAGNOSIS — R269 Unspecified abnormalities of gait and mobility: Secondary | ICD-10-CM | POA: Insufficient documentation

## 2013-04-04 DIAGNOSIS — S88119A Complete traumatic amputation at level between knee and ankle, unspecified lower leg, initial encounter: Secondary | ICD-10-CM | POA: Insufficient documentation

## 2013-04-04 DIAGNOSIS — R5381 Other malaise: Secondary | ICD-10-CM | POA: Insufficient documentation

## 2013-04-04 DIAGNOSIS — IMO0001 Reserved for inherently not codable concepts without codable children: Secondary | ICD-10-CM | POA: Insufficient documentation

## 2013-04-04 DIAGNOSIS — M6281 Muscle weakness (generalized): Secondary | ICD-10-CM | POA: Insufficient documentation

## 2013-04-09 ENCOUNTER — Encounter (HOSPITAL_BASED_OUTPATIENT_CLINIC_OR_DEPARTMENT_OTHER): Payer: PRIVATE HEALTH INSURANCE | Attending: Plastic Surgery

## 2013-04-09 DIAGNOSIS — L97809 Non-pressure chronic ulcer of other part of unspecified lower leg with unspecified severity: Secondary | ICD-10-CM | POA: Insufficient documentation

## 2013-04-10 ENCOUNTER — Ambulatory Visit: Payer: Medicare Other | Admitting: Physical Therapy

## 2013-04-10 NOTE — Progress Notes (Signed)
[facility]  NAME:  JUDI, JAFFE          ACCOUNT NO.:  [id number]  MEDICAL RECORD NO.:  [id number]      DATE OF BIRTH:  12/15/1945  PHYSICIAN:  [name], DO       VISIT DATE:  04/09/2013                                  OFFICE VISIT   SUBJECTIVE:  The patient is a 68 year old female who is here for followup on her left leg anterior ulcer on the lower aspect.  She has been using collagen.  There is a little bit of improvement overall.  Her leg looks much better with decreased swelling, decreased redness.  There is no change in her medications or social history.  PHYSICAL EXAMINATION:  She is alert, oriented, cooperative, not in any distress.  Pupils are equal.  Extraocular muscles are intact.  No breathing difficulties and her heart rate is regular.  We were able to get the Oasis approved, so we placed that today and with a Kerlix and an Ace, and we will see her back in 1 week and hopefully we can repeat this.     [name], DO     CS/MEDQ  D:  04/09/2013  T:  04/10/2013  Job:  [id number]

## 2013-04-16 DIAGNOSIS — L97809 Non-pressure chronic ulcer of other part of unspecified lower leg with unspecified severity: Secondary | ICD-10-CM | POA: Diagnosis not present

## 2013-04-17 ENCOUNTER — Ambulatory Visit: Payer: Medicare Other | Admitting: Physical Therapy

## 2013-04-17 ENCOUNTER — Ambulatory Visit: Payer: PRIVATE HEALTH INSURANCE | Attending: Orthopedic Surgery | Admitting: Physical Therapy

## 2013-04-17 DIAGNOSIS — IMO0001 Reserved for inherently not codable concepts without codable children: Secondary | ICD-10-CM | POA: Insufficient documentation

## 2013-04-17 DIAGNOSIS — M6281 Muscle weakness (generalized): Secondary | ICD-10-CM | POA: Insufficient documentation

## 2013-04-17 DIAGNOSIS — S88119A Complete traumatic amputation at level between knee and ankle, unspecified lower leg, initial encounter: Secondary | ICD-10-CM | POA: Insufficient documentation

## 2013-04-17 DIAGNOSIS — R5381 Other malaise: Secondary | ICD-10-CM | POA: Insufficient documentation

## 2013-04-17 DIAGNOSIS — R269 Unspecified abnormalities of gait and mobility: Secondary | ICD-10-CM | POA: Insufficient documentation

## 2013-04-17 NOTE — Progress Notes (Signed)
[facility]  NAME:  [name], [name]          ACCOUNT NO.:  [id number]  MEDICAL RECORD NO.:  [id number]      DATE OF BIRTH:  [birth year]  PHYSICIAN:  [name], DO       VISIT DATE:  04/16/2013                                  OFFICE VISIT   The patient is a 68 year old female who is here for followup on her left lower extremity ulcer.  OASIS was placed last week, and she has done extremely well with it.  She does have a little bit of periwound irritation.  There has been no change in her medications or social history.  Review of systems is otherwise negative.  GENERAL:  On exam, she is alert, oriented, cooperative, pleasant like usual. HEENT:  Pupils equal. RESPIRATION:  Breathing unlabored. HEART:  Rate regular. ABDOMEN:  Not examined.  The wound is showing some signs of improvement.  We will put some TCA around the periwound area.  Apply another OASIS and wrap with Kerlix and an Ace wrap.  She is to keep it elevated, and we will see her back in a week.  We will also refer her to the cardiologist and she is requesting for the possibility of intervention if it is possible.     [name], DO     CS/MEDQ  D:  04/16/2013  T:  04/17/2013  Job:  [id number]

## 2013-04-19 ENCOUNTER — Ambulatory Visit: Payer: PRIVATE HEALTH INSURANCE | Admitting: Physical Therapy

## 2013-04-23 ENCOUNTER — Ambulatory Visit: Payer: PRIVATE HEALTH INSURANCE | Admitting: Physical Therapy

## 2013-04-23 DIAGNOSIS — L97809 Non-pressure chronic ulcer of other part of unspecified lower leg with unspecified severity: Secondary | ICD-10-CM | POA: Diagnosis not present

## 2013-04-24 ENCOUNTER — Encounter: Payer: Self-pay | Admitting: Physical Therapy

## 2013-04-24 NOTE — Progress Notes (Signed)
[facility]  NAME:  [name], STREHL          ACCOUNT NO.:  [id number]  MEDICAL RECORD NO.:  [id number]      DATE OF BIRTH:  04-Dec-1945  PHYSICIAN:  [name], DO       VISIT DATE:  04/23/2013                                  OFFICE VISIT   The patient is a 68 year old female who is here for followup on her left lower extremity ulcer.  We did Oasis last week and it has been wrapped since then.  Overall, there is some improvement.  It looks better.  She has a little periwound breakdown, but it is doing better.  We will continue with the Oasis again this week and wrap.  We will continue with elevation, multivitamin, vitamin C, zinc, and we will see her back in a week.     [name], DO     CS/MEDQ  D:  04/23/2013  T:  04/23/2013  Job:  [id number]

## 2013-04-26 ENCOUNTER — Ambulatory Visit: Payer: PRIVATE HEALTH INSURANCE | Admitting: Physical Therapy

## 2013-04-30 ENCOUNTER — Ambulatory Visit: Payer: PRIVATE HEALTH INSURANCE | Admitting: Physical Therapy

## 2013-04-30 DIAGNOSIS — L97809 Non-pressure chronic ulcer of other part of unspecified lower leg with unspecified severity: Secondary | ICD-10-CM | POA: Diagnosis not present

## 2013-05-01 ENCOUNTER — Encounter: Payer: Self-pay | Admitting: Physical Therapy

## 2013-05-02 ENCOUNTER — Ambulatory Visit: Payer: PRIVATE HEALTH INSURANCE | Admitting: Physical Therapy

## 2013-05-03 ENCOUNTER — Encounter: Payer: Self-pay | Admitting: Physical Therapy

## 2013-05-07 ENCOUNTER — Ambulatory Visit: Payer: PRIVATE HEALTH INSURANCE | Admitting: Physical Therapy

## 2013-05-07 DIAGNOSIS — L97809 Non-pressure chronic ulcer of other part of unspecified lower leg with unspecified severity: Secondary | ICD-10-CM | POA: Diagnosis not present

## 2013-05-08 ENCOUNTER — Encounter: Payer: Self-pay | Admitting: Physical Therapy

## 2013-05-10 ENCOUNTER — Encounter: Payer: Self-pay | Admitting: Physical Therapy

## 2013-05-11 ENCOUNTER — Telehealth (HOSPITAL_COMMUNITY): Payer: Self-pay | Admitting: *Deleted

## 2013-05-14 ENCOUNTER — Encounter (HOSPITAL_BASED_OUTPATIENT_CLINIC_OR_DEPARTMENT_OTHER): Payer: PRIVATE HEALTH INSURANCE | Attending: Plastic Surgery

## 2013-05-14 DIAGNOSIS — L97809 Non-pressure chronic ulcer of other part of unspecified lower leg with unspecified severity: Secondary | ICD-10-CM | POA: Insufficient documentation

## 2013-05-15 NOTE — Progress Notes (Signed)
[facility]  NAME:  [name], [name]          ACCOUNT NO.:  [id number]  MEDICAL RECORD NO.:  [id number]      DATE OF BIRTH:  December 04, 1945  PHYSICIAN:  [name], DO       VISIT DATE:  05/14/2013                                  OFFICE VISIT   HISTORY OF PRESENT ILLNESS:  The patient is a 68 year old female who is here for followup on her left lower extremity ulcer.  She had Oasis placed last week and unfortunately got a pretty bad reaction to the periwound area.  With that in mind, we need to make some changes.  There is no sign of infection.  REVIEW OF SYSTEMS:  Otherwise negative.  She does have an appointment to see Dr. Gwenlyn Found in 2 days.  SOCIAL HISTORY:  There is no change in her social history.  PHYSICAL EXAMINATION:  GENERAL:  On exam, she is alert, oriented, and cooperative, not in any acute distress.  She is pleasant. HEENT:  Pupils are equal.  Extraocular muscles are intact.  No cervical lymphadenopathy. CHEST:  Breathing is unlabored. HEART:  Her heart rate is regular. EXTREMITIES:  Her lower extremity is a little more red and irritated compared to what it was previously with some periwound breakdown and the actual wound is a little bigger.  PLAN:  We are going to switch to silver alginate on the periphery and Endoform on the wound.  Elevation, multivitamin, vitamin C, zinc, and we will see her back in 1 week.     [name], DO     CS/MEDQ  D:  05/14/2013  T:  05/15/2013  Job:  [id number]

## 2013-05-16 ENCOUNTER — Ambulatory Visit (INDEPENDENT_AMBULATORY_CARE_PROVIDER_SITE_OTHER): Payer: Medicare Other | Admitting: Cardiovascular Disease

## 2013-05-16 ENCOUNTER — Encounter: Payer: Self-pay | Admitting: Cardiovascular Disease

## 2013-05-16 ENCOUNTER — Encounter: Payer: Self-pay | Admitting: Physical Therapy

## 2013-05-16 VITALS — BP 101/61 | HR 61 | Ht 71.0 in | Wt 165.0 lb

## 2013-05-16 DIAGNOSIS — L02419 Cutaneous abscess of limb, unspecified: Secondary | ICD-10-CM

## 2013-05-16 DIAGNOSIS — I739 Peripheral vascular disease, unspecified: Secondary | ICD-10-CM | POA: Diagnosis not present

## 2013-05-16 DIAGNOSIS — L03116 Cellulitis of left lower limb: Secondary | ICD-10-CM

## 2013-05-16 DIAGNOSIS — L03119 Cellulitis of unspecified part of limb: Secondary | ICD-10-CM

## 2013-05-16 NOTE — Patient Instructions (Addendum)
Dr Gwenlyn Found has referred you to Dr Andree Elk.

## 2013-05-16 NOTE — Progress Notes (Signed)
05/16/2013 [name]   October 04, 1945  [id number]  Primary Physician Gilford Rile, MD Primary Cardiologist: Lorretta Harp MD Renae Gloss   HPI:  [name] is a 68 year old thin appearing widowed Caucasian female with no children who was referred by Dr. Migdalia Dk for peripheral vascular evaluation. She has a history of 15 pack years of tobacco abuse continuing to smoke. There is no family history. She is not diabetic nor does she have hypertension or hyperlipidemia. She does have paroxysmal atrial fibrillation has had ablation in the past by Dr. Elonda Husky and is on Pentasa. 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 She injured her left pretibial area back in October and this has been difficult to heal since that time. She has received 6 months of hyperbaric therapy and aggressive local wound care. Venous Doppler showed reflux in the left greater saphenous vein which was too small for endovenous and probably noncontributory. Arterial Dopplers revealed an occluded anterior tibial and posterior tibial artery with a patent peroneal artery.   Current Outpatient Prescriptions  Medication Sig Dispense Refill  . Albuterol Sulfate (PROAIR HFA IN) Inhale 2 puffs into the lungs every 6 (six) hours as needed (For shortness of breath.).       Marland Kitchen amiodarone (PACERONE) 200 MG tablet Take 200 mg by mouth every evening.       Marland Kitchen ascorbic acid (VITAMIN C) 1000 MG tablet Take 1,000 mg by mouth at bedtime.      . celecoxib (CELEBREX) 200 MG capsule Take 200 mg by mouth 2 (two) times daily.       Marland Kitchen CRANBERRY PO Take 2 tablets by mouth at bedtime.      . dabigatran (PRADAXA) 150 MG CAPS Take 150 mg by mouth 2 (two) times daily.       Marland Kitchen dimenhyDRINATE (DRAMAMINE) 50 MG tablet Take 50 mg by mouth at bedtime as needed (sleep).      . diphenhydrAMINE (BENADRYL) 25 mg capsule Take 50 mg by mouth at bedtime as needed for itching or sleep.      Marland Kitchen  Fluticasone-Salmeterol (ADVAIR DISKUS) 100-50 MCG/DOSE AEPB Inhale 1 puff into the lungs 2 (two) times daily.      Marland Kitchen HYDROcodone-acetaminophen (NORCO) 7.5-325 MG per tablet Take 1-2 tablets by mouth every 4 (four) hours as needed for pain.  80 tablet  0  . hydroxypropyl methylcellulose (ISOPTO TEARS) 2.5 % ophthalmic solution Place 1 drop into both eyes daily as needed (For dry eyes or allergies.). As needed      . methocarbamol (ROBAXIN) 500 MG tablet Take 1 tablet (500 mg total) by mouth every 6 (six) hours as needed (muscle spasms).  50 tablet  0  . MYRBETRIQ 50 MG TB24 tablet Take 50 mg by mouth daily.       . naproxen (NAPROSYN) 500 MG tablet Take 1 tablet (500 mg total) by mouth 2 (two) times daily with a meal.  30 tablet  0  . omeprazole (PRILOSEC) 20 MG capsule Take 20 mg by mouth at bedtime.      . pregabalin (LYRICA) 50 MG capsule Take 50 mg by mouth 2 (two) times daily.       Marland Kitchen pyridoxine (B-6) 100 MG tablet Take 100 mg by mouth every evening.       . traMADol (ULTRAM) 50 MG tablet Take 50 mg by mouth at bedtime as needed.       . traZODone (DESYREL) 50 MG  tablet Take 50 mg by mouth at bedtime.       No current facility-administered medications for this visit.    Allergies  Allergen Reactions  . Sulfa Antibiotics Anaphylaxis and Shortness Of Breath  . Adhesive [Tape] Other (See Comments)    Satiny and adhesive tape - blisters skin  . Avelox [Moxifloxacin Hcl In Nacl] Diarrhea and Nausea And Vomiting  . Penicillins Hives and Rash    History   Social History  . Marital Status: Widowed    Spouse Name: N/A    Number of Children: N/A  . Years of Education: N/A   Occupational History  . Not on file.   Social History Main Topics  . Smoking status: Former Smoker -- 1.50 packs/day for 22 years    Quit date: 02/21/2010  . Smokeless tobacco: Never Used  . Alcohol Use: No  . Drug Use: No  . Sexual Activity: Not on file   Other Topics Concern  . Not on file   Social  History Narrative  . No narrative on file     Review of Systems: General: negative for chills, fever, night sweats or weight changes.  Cardiovascular: negative for chest pain, dyspnea on exertion, edema, orthopnea, palpitations, paroxysmal nocturnal dyspnea or shortness of breath Dermatological: negative for rash Respiratory: negative for cough or wheezing Urologic: negative for hematuria Abdominal: negative for nausea, vomiting, diarrhea, bright red blood per rectum, melena, or hematemesis Neurologic: negative for visual changes, syncope, or dizziness All other systems reviewed and are otherwise negative except as noted above.    Blood pressure 101/61, pulse 61, height 5\' 11"  (1.803 m), weight 165 lb (74.844 kg).  General appearance: alert and no distress Neck: no adenopathy, no carotid bruit, no JVD, supple, symmetrical, trachea midline and thyroid not enlarged, symmetric, no tenderness/mass/nodules Lungs: clear to auscultation bilaterally Heart: regular rate and rhythm, S1, S2 normal, no murmur, click, rub or gallop Extremities: absent left pedal pulses  EKG not performed today  ASSESSMENT AND PLAN:   Left leg cellulitis The patient was referred to me by Dr. Steele Sizer for peripheral vascular evaluation. She has a history of tobacco abuse. 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 She traumatized her left pretibial area back in October and has had  Difficulty healing and also since that time. She had 6 months of hyperbaric therapy and was referred by Dr. Migdalia Dk for peripheral vascular evaluation. Her venous Doppler showed left greater saphenous vein reflux but this was a small vein. Her arterial Dopplers reveal occluded anterior /posterior tibial arteries  with a patent peroneal artery      Lorretta Harp MD [facility], [facility] 05/16/2013 5:27 PM

## 2013-05-16 NOTE — Assessment & Plan Note (Signed)
The patient was referred to me by Dr. Steele Sizer for peripheral vascular evaluation. She has a history of tobacco abuse. 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 She traumatized her left pretibial area back in October and has had  Difficulty healing and also since that time. She had 6 months of hyperbaric therapy and was referred by Dr. Migdalia Dk for peripheral vascular evaluation. Her venous Doppler showed left greater saphenous vein reflux but this was a small vein. Her arterial Dopplers reveal occluded anterior /posterior tibial arteries  with a patent peroneal artery

## 2013-05-17 ENCOUNTER — Ambulatory Visit: Payer: PRIVATE HEALTH INSURANCE | Admitting: Physical Therapy

## 2013-05-17 ENCOUNTER — Ambulatory Visit: Payer: PRIVATE HEALTH INSURANCE | Attending: Orthopedic Surgery | Admitting: Physical Therapy

## 2013-05-17 DIAGNOSIS — S88119A Complete traumatic amputation at level between knee and ankle, unspecified lower leg, initial encounter: Secondary | ICD-10-CM | POA: Insufficient documentation

## 2013-05-17 DIAGNOSIS — IMO0001 Reserved for inherently not codable concepts without codable children: Secondary | ICD-10-CM | POA: Insufficient documentation

## 2013-05-17 DIAGNOSIS — M6281 Muscle weakness (generalized): Secondary | ICD-10-CM | POA: Insufficient documentation

## 2013-05-17 DIAGNOSIS — R5381 Other malaise: Secondary | ICD-10-CM | POA: Insufficient documentation

## 2013-05-17 DIAGNOSIS — R269 Unspecified abnormalities of gait and mobility: Secondary | ICD-10-CM | POA: Insufficient documentation

## 2013-05-18 ENCOUNTER — Telehealth: Payer: Self-pay | Admitting: Cardiovascular Disease

## 2013-05-18 ENCOUNTER — Ambulatory Visit: Payer: PRIVATE HEALTH INSURANCE | Admitting: Physical Therapy

## 2013-05-18 NOTE — Telephone Encounter (Signed)
Faxed records and referral form to [facility] -  Dr. Andree Elk.

## 2013-05-21 ENCOUNTER — Ambulatory Visit: Payer: PRIVATE HEALTH INSURANCE | Admitting: Physical Therapy

## 2013-05-22 NOTE — Progress Notes (Signed)
[facility]  NAME:  [name], [name]               ACCOUNT NO.:  MEDICAL RECORD NO.:  [id number]      DATE OF BIRTH:  25-Oct-1945  PHYSICIAN:  [name], DO       VISIT DATE:  05/21/2013                                  OFFICE VISIT   HISTORY OF PRESENT ILLNESS:  The patient is a 68 year old female who is here for followup on her left leg ulcer.  She actually looks much better today.  The original ulcer looks to be healed.  She has got some periwound irritation and some superficial opening of the skin, but she is looking really good, it is less irritated and less red than it has been in a long time.  She saw Dr. Gwenlyn Found who has referred her to doctor for possible intervention.  He is a Hydrographic surveyor in [place].  There is no change in medications or social history.  There is some concern about her consumption of pain medicines and an alert was made by the pharmacist.  PHYSICAL EXAMINATION:  GENERAL:  On exam, she is alert, oriented, and cooperative, not in any acute distress.  She is pleasant. HEENT:  Pupils are equal.  Extraocular muscles are intact.  No cervical lymphadenopathy.  Her breathing is unlabored.  She is coherent.  The wound is described above.  Recommend following up with referral, which is for next week.  I have placed another Endoform.  No refill of pain medicine was authorized or given even though she did ask the nurse to ask me, and we will see her back in 1-2 weeks.     [name], DO     CS/MEDQ  D:  05/21/2013  T:  05/22/2013  Job:  [id number]

## 2013-05-24 ENCOUNTER — Ambulatory Visit: Payer: PRIVATE HEALTH INSURANCE | Admitting: Physical Therapy

## 2013-05-28 ENCOUNTER — Ambulatory Visit: Payer: PRIVATE HEALTH INSURANCE | Admitting: Physical Therapy

## 2013-05-29 ENCOUNTER — Ambulatory Visit: Payer: PRIVATE HEALTH INSURANCE | Admitting: Physical Therapy

## 2013-05-29 NOTE — Progress Notes (Signed)
[facility]  NAME:  [name], [name]               ACCOUNT NO.:  MEDICAL RECORD NO.:  [id number]      DATE OF BIRTH:  [birth year]  PHYSICIAN:  [name], DO       VISIT DATE:  05/28/2013                                  OFFICE VISIT   HISTORY:  The patient is a 68 year old female who is here for followup on her left lower extremity ulcer.  She is overall doing extremely well. It is filling in and closing up with the Endoform.  She states that she has been taking her vitamins.  There is no change in her medications or social history.  She does have an appointment to interventional/vascular surgeon in the [place], [place] area later this week.  PHYSICAL EXAMINATION:  On exam, she is alert, oriented, cooperative, pleasant, overall seems to be doing better.  Her pulses are strong and regular.  Her breathing is unlabored.  Her abdomen is soft.  The wound has markedly improved.  I placed another Endoform.  She will change it in 1 week,  use a little triple antibiotic ointment, and then see [place] back a week after.     [name], DO     CS/MEDQ  D:  05/28/2013  T:  05/29/2013  Job:  [id number]

## 2013-05-30 ENCOUNTER — Ambulatory Visit: Payer: PRIVATE HEALTH INSURANCE | Admitting: Physical Therapy

## 2013-06-06 ENCOUNTER — Ambulatory Visit: Payer: PRIVATE HEALTH INSURANCE | Admitting: Physical Therapy

## 2013-06-08 ENCOUNTER — Encounter: Payer: Self-pay | Admitting: Physical Therapy

## 2013-06-11 ENCOUNTER — Encounter (HOSPITAL_BASED_OUTPATIENT_CLINIC_OR_DEPARTMENT_OTHER): Payer: PRIVATE HEALTH INSURANCE | Attending: Plastic Surgery

## 2013-06-11 DIAGNOSIS — I872 Venous insufficiency (chronic) (peripheral): Secondary | ICD-10-CM | POA: Insufficient documentation

## 2013-06-11 DIAGNOSIS — L97809 Non-pressure chronic ulcer of other part of unspecified lower leg with unspecified severity: Secondary | ICD-10-CM | POA: Insufficient documentation

## 2013-06-11 DIAGNOSIS — I739 Peripheral vascular disease, unspecified: Secondary | ICD-10-CM | POA: Insufficient documentation

## 2013-06-12 ENCOUNTER — Encounter: Payer: Self-pay | Admitting: Physical Therapy

## 2013-06-15 ENCOUNTER — Encounter: Payer: Self-pay | Admitting: Physical Therapy

## 2013-06-18 ENCOUNTER — Encounter: Payer: Self-pay | Admitting: Physical Therapy

## 2013-06-19 NOTE — Progress Notes (Signed)
[facility]  NAME:  [name], [name]               ACCOUNT NO.:  MEDICAL RECORD NO.:  [id number]      DATE OF BIRTH:  01-Oct-1945  PHYSICIAN:  [name], DO       VISIT DATE:  06/18/2013                                  OFFICE VISIT   The patient is a 68 year old female who is here for followup on her left lower extremity ulcers.  She underwent balloon angioplasty of some sort on her lower extremity.  We do not have the results of that yet.  This was done in [place].  It seems to have helped.  She has got a little better color of her leg.  She also states that she stopped smoking a week ago.  The wounds are improving.  Her breathing is unlabored.  Her heart rate is regular.  Her color of the leg has improved.  The wounds are superficial.  Endoform was placed with Adaptic and we will follow her up in a week.  She is encouraged to continue no tobacco, increase protein, multivitamin and vitamin C, and elevation of her leg.     [name], DO     CS/MEDQ  D:  06/18/2013  T:  06/19/2013  Job:  [id number]

## 2013-06-21 ENCOUNTER — Telehealth: Payer: Self-pay | Admitting: *Deleted

## 2013-06-21 ENCOUNTER — Ambulatory Visit: Payer: PRIVATE HEALTH INSURANCE | Attending: Orthopedic Surgery | Admitting: Physical Therapy

## 2013-06-21 DIAGNOSIS — R269 Unspecified abnormalities of gait and mobility: Secondary | ICD-10-CM | POA: Insufficient documentation

## 2013-06-21 DIAGNOSIS — R5381 Other malaise: Secondary | ICD-10-CM | POA: Insufficient documentation

## 2013-06-21 DIAGNOSIS — M6281 Muscle weakness (generalized): Secondary | ICD-10-CM | POA: Insufficient documentation

## 2013-06-21 DIAGNOSIS — IMO0001 Reserved for inherently not codable concepts without codable children: Secondary | ICD-10-CM | POA: Insufficient documentation

## 2013-06-21 DIAGNOSIS — S88119A Complete traumatic amputation at level between knee and ankle, unspecified lower leg, initial encounter: Secondary | ICD-10-CM | POA: Insufficient documentation

## 2013-06-21 NOTE — Telephone Encounter (Signed)
Patient walked in from [place].  Has bruising of right hip around to right groin area.  No pain or heat to the skin.  Appears normal resolution of bruising from a recent abdominal angio by Dr. Andree Elk in [place].  Advised to keep an eye on the area and let us know if the area changes in temperature or sensitivity. Voiced understanding.  Has an appt next week with Dr. Gwenlyn Found.

## 2013-06-25 ENCOUNTER — Ambulatory Visit: Payer: PRIVATE HEALTH INSURANCE | Admitting: Physical Therapy

## 2013-06-26 ENCOUNTER — Ambulatory Visit: Payer: Self-pay | Admitting: Cardiology

## 2013-06-28 ENCOUNTER — Encounter: Payer: Self-pay | Admitting: Physical Therapy

## 2013-07-03 ENCOUNTER — Encounter (HOSPITAL_COMMUNITY): Payer: Self-pay | Admitting: Emergency Medicine

## 2013-07-03 ENCOUNTER — Inpatient Hospital Stay (HOSPITAL_COMMUNITY)
Admission: EM | Admit: 2013-07-03 | Discharge: 2013-07-06 | DRG: 190 | Disposition: A | Discharge status: Home / Self Care | Payer: PRIVATE HEALTH INSURANCE | Attending: Internal Medicine | Admitting: Internal Medicine

## 2013-07-03 ENCOUNTER — Emergency Department (HOSPITAL_COMMUNITY): Payer: PRIVATE HEALTH INSURANCE

## 2013-07-03 ENCOUNTER — Ambulatory Visit: Payer: PRIVATE HEALTH INSURANCE | Admitting: Physical Therapy

## 2013-07-03 DIAGNOSIS — D539 Nutritional anemia, unspecified: Secondary | ICD-10-CM | POA: Diagnosis present

## 2013-07-03 DIAGNOSIS — S78119A Complete traumatic amputation at level between unspecified hip and knee, initial encounter: Secondary | ICD-10-CM

## 2013-07-03 DIAGNOSIS — J45901 Unspecified asthma with (acute) exacerbation: Principal | ICD-10-CM

## 2013-07-03 DIAGNOSIS — K219 Gastro-esophageal reflux disease without esophagitis: Secondary | ICD-10-CM | POA: Diagnosis present

## 2013-07-03 DIAGNOSIS — D638 Anemia in other chronic diseases classified elsewhere: Secondary | ICD-10-CM

## 2013-07-03 DIAGNOSIS — Z87891 Personal history of nicotine dependence: Secondary | ICD-10-CM

## 2013-07-03 DIAGNOSIS — J441 Chronic obstructive pulmonary disease with (acute) exacerbation: Secondary | ICD-10-CM | POA: Diagnosis present

## 2013-07-03 DIAGNOSIS — Z79899 Other long term (current) drug therapy: Secondary | ICD-10-CM

## 2013-07-03 DIAGNOSIS — I509 Heart failure, unspecified: Secondary | ICD-10-CM | POA: Diagnosis present

## 2013-07-03 DIAGNOSIS — J449 Chronic obstructive pulmonary disease, unspecified: Secondary | ICD-10-CM | POA: Diagnosis present

## 2013-07-03 DIAGNOSIS — R0902 Hypoxemia: Secondary | ICD-10-CM

## 2013-07-03 DIAGNOSIS — R490 Dysphonia: Secondary | ICD-10-CM | POA: Diagnosis present

## 2013-07-03 DIAGNOSIS — Z8249 Family history of ischemic heart disease and other diseases of the circulatory system: Secondary | ICD-10-CM

## 2013-07-03 DIAGNOSIS — Z86718 Personal history of other venous thrombosis and embolism: Secondary | ICD-10-CM

## 2013-07-03 DIAGNOSIS — L03116 Cellulitis of left lower limb: Secondary | ICD-10-CM

## 2013-07-03 DIAGNOSIS — I4891 Unspecified atrial fibrillation: Secondary | ICD-10-CM | POA: Diagnosis present

## 2013-07-03 DIAGNOSIS — Z8673 Personal history of transient ischemic attack (TIA), and cerebral infarction without residual deficits: Secondary | ICD-10-CM

## 2013-07-03 DIAGNOSIS — I5032 Chronic diastolic (congestive) heart failure: Secondary | ICD-10-CM | POA: Diagnosis present

## 2013-07-03 DIAGNOSIS — J9600 Acute respiratory failure, unspecified whether with hypoxia or hypercapnia: Secondary | ICD-10-CM | POA: Diagnosis not present

## 2013-07-03 DIAGNOSIS — S88919A Complete traumatic amputation of unspecified lower leg, level unspecified, initial encounter: Secondary | ICD-10-CM

## 2013-07-03 DIAGNOSIS — Z89619 Acquired absence of unspecified leg above knee: Secondary | ICD-10-CM

## 2013-07-03 LAB — CBC WITH DIFFERENTIAL/PLATELET
BASOS PCT: 1 % (ref 0–1)
Basophils Absolute: 0 10*3/uL (ref 0.0–0.1)
Eosinophils Absolute: 0.1 10*3/uL (ref 0.0–0.7)
Eosinophils Relative: 1 % (ref 0–5)
HCT: 32.8 % — ABNORMAL LOW (ref 36.0–46.0)
Hemoglobin: 11.4 g/dL — ABNORMAL LOW (ref 12.0–15.0)
Lymphocytes Relative: 29 % (ref 12–46)
Lymphs Abs: 1.3 10*3/uL (ref 0.7–4.0)
MCH: 36.2 pg — ABNORMAL HIGH (ref 26.0–34.0)
MCHC: 34.8 g/dL (ref 30.0–36.0)
MCV: 104.1 fL — AB (ref 78.0–100.0)
Monocytes Absolute: 0.5 10*3/uL (ref 0.1–1.0)
Monocytes Relative: 11 % (ref 3–12)
Neutro Abs: 2.5 10*3/uL (ref 1.7–7.7)
Neutrophils Relative %: 58 % (ref 43–77)
PLATELETS: 253 10*3/uL (ref 150–400)
RBC: 3.15 MIL/uL — ABNORMAL LOW (ref 3.87–5.11)
RDW: 13.8 % (ref 11.5–15.5)
WBC: 4.3 10*3/uL (ref 4.0–10.5)

## 2013-07-03 LAB — URINALYSIS, ROUTINE W REFLEX MICROSCOPIC
BILIRUBIN URINE: NEGATIVE
GLUCOSE, UA: NEGATIVE mg/dL
Hgb urine dipstick: NEGATIVE
Ketones, ur: NEGATIVE mg/dL
Leukocytes, UA: NEGATIVE
Nitrite: NEGATIVE
PROTEIN: NEGATIVE mg/dL
Specific Gravity, Urine: 1.02 (ref 1.005–1.030)
UROBILINOGEN UA: 1 mg/dL (ref 0.0–1.0)
pH: 6 (ref 5.0–8.0)

## 2013-07-03 LAB — I-STAT TROPONIN, ED: Troponin i, poc: 0 ng/mL (ref 0.00–0.08)

## 2013-07-03 LAB — BASIC METABOLIC PANEL
BUN: 13 mg/dL (ref 6–23)
CALCIUM: 9 mg/dL (ref 8.4–10.5)
CO2: 25 mEq/L (ref 19–32)
CREATININE: 0.77 mg/dL (ref 0.50–1.10)
Chloride: 100 mEq/L (ref 96–112)
GFR calc Af Amer: >90 mL/min (ref >90)
GFR, EST NON AFRICAN AMERICAN: 85 mL/min — AB (ref >90)
Glucose, Bld: 94 mg/dL (ref 70–99)
Potassium: 4.1 mEq/L (ref 3.7–5.3)
SODIUM: 139 meq/L (ref 137–147)

## 2013-07-03 LAB — PRO B NATRIURETIC PEPTIDE: Pro B Natriuretic peptide (BNP): 353.2 pg/mL — ABNORMAL HIGH (ref 0–125)

## 2013-07-03 MED ORDER — IPRATROPIUM-ALBUTEROL 0.5-2.5 (3) MG/3ML IN SOLN
3.0000 mL | Freq: Four times a day (QID) | RESPIRATORY_TRACT | Status: DC
  Administered 2013-07-04 – 2013-07-05 (×8): 3 mL via RESPIRATORY_TRACT
  Filled 2013-07-03 (×8): qty 3

## 2013-07-03 MED ORDER — CELECOXIB 200 MG PO CAPS
200.0000 mg | ORAL_CAPSULE | Freq: Two times a day (BID) | ORAL | Status: DC
  Administered 2013-07-03 – 2013-07-06 (×6): 200 mg via ORAL
  Filled 2013-07-03 (×7): qty 1

## 2013-07-03 MED ORDER — TRAZODONE HCL 50 MG PO TABS
50.0000 mg | ORAL_TABLET | Freq: Every day | ORAL | Status: DC
  Administered 2013-07-03 – 2013-07-05 (×3): 50 mg via ORAL
  Filled 2013-07-03 (×5): qty 1

## 2013-07-03 MED ORDER — VITAMIN B-6 100 MG PO TABS: 100.0000 mg | ORAL_TABLET | Freq: Every evening | ORAL | Status: DC

## 2013-07-03 MED ORDER — IPRATROPIUM-ALBUTEROL 0.5-2.5 (3) MG/3ML IN SOLN
3.0000 mL | RESPIRATORY_TRACT | Status: DC
  Administered 2013-07-03: 3 mL via RESPIRATORY_TRACT
  Filled 2013-07-03: qty 3

## 2013-07-03 MED ORDER — DIPHENHYDRAMINE HCL 50 MG PO CAPS
50.0000 mg | ORAL_CAPSULE | Freq: Every evening | ORAL | Status: DC | PRN
  Administered 2013-07-03 – 2013-07-04 (×2): 50 mg via ORAL
  Filled 2013-07-03 (×2): qty 1

## 2013-07-03 MED ORDER — ALBUTEROL SULFATE (2.5 MG/3ML) 0.083% IN NEBU: 2.5000 mg | INHALATION_SOLUTION | Freq: Four times a day (QID) | RESPIRATORY_TRACT | Status: DC | PRN

## 2013-07-03 MED ORDER — MIRABEGRON ER 50 MG PO TB24
50.0000 mg | ORAL_TABLET | Freq: Every day | ORAL | Status: DC
  Administered 2013-07-03 – 2013-07-06 (×4): 50 mg via ORAL
  Filled 2013-07-03 (×4): qty 1

## 2013-07-03 MED ORDER — PREDNISONE 50 MG PO TABS
50.0000 mg | ORAL_TABLET | Freq: Every day | ORAL | Status: DC
  Administered 2013-07-04 – 2013-07-06 (×3): 50 mg via ORAL
  Filled 2013-07-03 (×4): qty 1

## 2013-07-03 MED ORDER — TRAMADOL HCL 50 MG PO TABS
50.0000 mg | ORAL_TABLET | Freq: Every evening | ORAL | Status: DC | PRN
  Administered 2013-07-04 – 2013-07-05 (×3): 50 mg via ORAL
  Filled 2013-07-03 (×3): qty 1

## 2013-07-03 MED ORDER — IPRATROPIUM-ALBUTEROL 0.5-2.5 (3) MG/3ML IN SOLN
3.0000 mL | Freq: Once | RESPIRATORY_TRACT | Status: AC
  Administered 2013-07-03: 3 mL via RESPIRATORY_TRACT
  Filled 2013-07-03: qty 3

## 2013-07-03 MED ORDER — PANTOPRAZOLE SODIUM 40 MG PO TBEC
40.0000 mg | DELAYED_RELEASE_TABLET | Freq: Every day | ORAL | Status: DC
  Administered 2013-07-03 – 2013-07-06 (×4): 40 mg via ORAL
  Filled 2013-07-03 (×4): qty 1

## 2013-07-03 MED ORDER — DIMENHYDRINATE 50 MG PO TABS
50.0000 mg | ORAL_TABLET | Freq: Every evening | ORAL | Status: DC | PRN
  Administered 2013-07-04 (×2): 50 mg via ORAL
  Filled 2013-07-03 (×4): qty 1

## 2013-07-03 MED ORDER — METHYLPREDNISOLONE SODIUM SUCC 125 MG IJ SOLR
125.0000 mg | Freq: Once | INTRAMUSCULAR | Status: AC
  Administered 2013-07-03: 125 mg via INTRAVENOUS
  Filled 2013-07-03: qty 2

## 2013-07-03 MED ORDER — DOXYCYCLINE HYCLATE 100 MG PO TABS
100.0000 mg | ORAL_TABLET | Freq: Two times a day (BID) | ORAL | Status: DC
  Administered 2013-07-03 – 2013-07-04 (×2): 100 mg via ORAL
  Filled 2013-07-03 (×3): qty 1

## 2013-07-03 MED ORDER — VITAMIN B-6 100 MG PO TABS
100.0000 mg | ORAL_TABLET | Freq: Every day | ORAL | Status: DC
  Administered 2013-07-03 – 2013-07-05 (×3): 100 mg via ORAL
  Filled 2013-07-03 (×4): qty 1

## 2013-07-03 MED ORDER — DABIGATRAN ETEXILATE MESYLATE 150 MG PO CAPS
150.0000 mg | ORAL_CAPSULE | Freq: Two times a day (BID) | ORAL | Status: DC
  Administered 2013-07-03 – 2013-07-06 (×6): 150 mg via ORAL
  Filled 2013-07-03 (×7): qty 1

## 2013-07-03 MED ORDER — HYPROMELLOSE (GONIOSCOPIC) 2.5 % OP SOLN
1.0000 [drp] | Freq: Every day | OPHTHALMIC | Status: DC | PRN
  Filled 2013-07-03: qty 15

## 2013-07-03 MED ORDER — MOMETASONE FURO-FORMOTEROL FUM 100-5 MCG/ACT IN AERO
2.0000 | INHALATION_SPRAY | Freq: Two times a day (BID) | RESPIRATORY_TRACT | Status: DC
  Administered 2013-07-04 – 2013-07-06 (×5): 2 via RESPIRATORY_TRACT
  Filled 2013-07-03: qty 8.8

## 2013-07-03 MED ORDER — ALBUTEROL (5 MG/ML) CONTINUOUS INHALATION SOLN
10.0000 mg/h | INHALATION_SOLUTION | RESPIRATORY_TRACT | Status: DC
  Administered 2013-07-03: 10 mg/h via RESPIRATORY_TRACT

## 2013-07-03 MED ORDER — SALINE SPRAY 0.65 % NA SOLN
2.0000 | NASAL | Status: DC | PRN
Start: 2013-07-03 — End: 2013-07-06
  Administered 2013-07-05: 2 via NASAL
  Filled 2013-07-03: qty 44

## 2013-07-03 MED ORDER — POLYVINYL ALCOHOL 1.4 % OP SOLN
1.0000 [drp] | Freq: Every day | OPHTHALMIC | Status: DC | PRN
  Filled 2013-07-03: qty 15

## 2013-07-03 MED ORDER — PREGABALIN 50 MG PO CAPS
50.0000 mg | ORAL_CAPSULE | Freq: Two times a day (BID) | ORAL | Status: DC
  Administered 2013-07-03 – 2013-07-06 (×6): 50 mg via ORAL
  Filled 2013-07-03: qty 1
  Filled 2013-07-03: qty 2
  Filled 2013-07-03 (×4): qty 1

## 2013-07-03 MED ORDER — METHOCARBAMOL 500 MG PO TABS
500.0000 mg | ORAL_TABLET | Freq: Four times a day (QID) | ORAL | Status: DC | PRN
  Administered 2013-07-04 – 2013-07-05 (×3): 500 mg via ORAL
  Filled 2013-07-03 (×3): qty 1

## 2013-07-03 MED ORDER — HYDROCODONE-ACETAMINOPHEN 7.5-325 MG PO TABS
1.0000 | ORAL_TABLET | ORAL | Status: DC | PRN
  Administered 2013-07-03 – 2013-07-06 (×4): 2 via ORAL
  Filled 2013-07-03 (×4): qty 2

## 2013-07-03 MED ORDER — ALBUTEROL SULFATE HFA 108 (90 BASE) MCG/ACT IN AERS: 1.0000 | INHALATION_SPRAY | RESPIRATORY_TRACT | Status: DC | PRN

## 2013-07-03 MED ORDER — AMIODARONE HCL 200 MG PO TABS
200.0000 mg | ORAL_TABLET | Freq: Every evening | ORAL | Status: DC
  Administered 2013-07-03 – 2013-07-05 (×3): 200 mg via ORAL
  Filled 2013-07-03 (×4): qty 1

## 2013-07-03 NOTE — ED Provider Notes (Signed)
TIME SEEN: 5:15 PM  CHIEF COMPLAINT: Short of breath   HPI: Patient is a 68 year old female with a history of COPD, CHF, peripheral vascular disease status post right AKA who presents to the emergency department with complaints of shortness of breath that started on Saturday, 3 days ago. She states is worse with exertion. She has had some chest tightness and a cough with clear sputum production. No fevers. No swelling of her lower extremity.  She states that she has a pulse oximeter at home and her oxygen saturation after angulation has been in the low 80s. No history of PE or DVT. No history of MI.  ROS: See HPI Constitutional: no fever  Eyes: no drainage  ENT: no runny nose   Cardiovascular:   chest pain  Resp:  SOB  GI: no vomiting GU: no dysuria Integumentary: no rash  Allergy: no hives  Musculoskeletal: no leg swelling  Neurological: no slurred speech ROS otherwise negative  PAST MEDICAL HISTORY/PAST SURGICAL HISTORY:  Past Medical History  Diagnosis Date  . Asthma   . GERD (gastroesophageal reflux disease)   . COPD (chronic obstructive pulmonary disease) with emphysema   . History of TIA (transient ischemic attack)     PER MRI 2009-- NO RESIDUAL  . Osteoarthritis   . Rheumatoid arthritis(714.0)   . DJD (degenerative joint disease)   . B12 deficiency anemia   . History of DVT of lower extremity     RIGHT LOWER LEG THEN MOVED TO GOIN S/P REMOVAL  . S/P AKA (above knee amputation) unilateral     RIGHT 12-17-2010  . Ulcer of left lower leg   . PAF (paroxysmal atrial fibrillation)   . S/P ablation of atrial fibrillation   . First degree heart block   . History of CHF (congestive heart failure)   . PONV (postoperative nausea and vomiting)   . Chronic venous insufficiency   . Venous insufficiency   . Peripheral arterial disease     nonhealing left foot ulcer    MEDICATIONS:  Prior to Admission medications   Medication Sig Start Date End Date Taking? Authorizing  Provider  Albuterol Sulfate (PROAIR HFA IN) Inhale 2 puffs into the lungs every 6 (six) hours as needed (For shortness of breath.).     Historical Provider, MD  amiodarone (PACERONE) 200 MG tablet Take 200 mg by mouth every evening.     Historical Provider, MD  ascorbic acid (VITAMIN C) 1000 MG tablet Take 1,000 mg by mouth at bedtime.    Historical Provider, MD  celecoxib (CELEBREX) 200 MG capsule Take 200 mg by mouth 2 (two) times daily.     Historical Provider, MD  CRANBERRY PO Take 2 tablets by mouth at bedtime.    Historical Provider, MD  dabigatran (PRADAXA) 150 MG CAPS Take 150 mg by mouth 2 (two) times daily.     Historical Provider, MD  dimenhyDRINATE (DRAMAMINE) 50 MG tablet Take 50 mg by mouth at bedtime as needed (sleep).    Historical Provider, MD  diphenhydrAMINE (BENADRYL) 25 mg capsule Take 50 mg by mouth at bedtime as needed for itching or sleep.    Historical Provider, MD  Fluticasone-Salmeterol (ADVAIR DISKUS) 100-50 MCG/DOSE AEPB Inhale 1 puff into the lungs 2 (two) times daily.    Historical Provider, MD  HYDROcodone-acetaminophen (NORCO) 7.5-325 MG per tablet Take 1-2 tablets by mouth every 4 (four) hours as needed for pain. 09/14/12   Lucille Passy Babish, PA-C  hydroxypropyl methylcellulose (ISOPTO TEARS) 2.5 % ophthalmic solution  Place 1 drop into both eyes daily as needed (For dry eyes or allergies.). As needed    Historical Provider, MD  methocarbamol (ROBAXIN) 500 MG tablet Take 1 tablet (500 mg total) by mouth every 6 (six) hours as needed (muscle spasms). 09/14/12   Lucille Passy Babish, PA-C  MYRBETRIQ 50 MG TB24 tablet Take 50 mg by mouth daily.  04/26/13   Historical Provider, MD  naproxen (NAPROSYN) 500 MG tablet Take 1 tablet (500 mg total) by mouth 2 (two) times daily with a meal. 11/18/12   Johnna Acosta, MD  omeprazole (PRILOSEC) 20 MG capsule Take 20 mg by mouth at bedtime.    Historical Provider, MD  pregabalin (LYRICA) 50 MG capsule Take 50 mg by mouth 2 (two)  times daily.     Historical Provider, MD  pyridoxine (B-6) 100 MG tablet Take 100 mg by mouth every evening.     Historical Provider, MD  traMADol (ULTRAM) 50 MG tablet Take 50 mg by mouth at bedtime as needed.  04/09/13   Historical Provider, MD  traZODone (DESYREL) 50 MG tablet Take 50 mg by mouth at bedtime.    Historical Provider, MD    ALLERGIES:  Allergies  Allergen Reactions  . Sulfa Antibiotics Anaphylaxis and Shortness Of Breath  . Adhesive [Tape] Other (See Comments)    Satiny and adhesive tape - blisters skin  . Avelox [Moxifloxacin Hcl In Nacl] Diarrhea and Nausea And Vomiting  . Penicillins Hives and Rash    SOCIAL HISTORY:  History  Substance Use Topics  . Smoking status: Former Smoker -- 1.50 packs/day for 22 years    Quit date: 02/21/2010  . Smokeless tobacco: Never Used  . Alcohol Use: No    FAMILY HISTORY: Family History  Problem Relation Age of Onset  . Achondroplasia Father   . Congenital heart disease Mother   . Hypertension Mother   . Hypertension Sister     EXAM: BP 113/61  Pulse 66  Temp(Src) 98.7 F (37.1 C) (Oral)  Resp 18  SpO2 92% CONSTITUTIONAL: Alert and oriented and responds appropriately to questions. Well-appearing; well-nourished HEAD: Normocephalic EYES: Conjunctivae clear, PERRL ENT: normal nose; no rhinorrhea; moist mucous membranes; pharynx without lesions noted NECK: Supple, no meningismus, no LAD  CARD: RRR; S1 and S2 appreciated; no murmurs, no clicks, no rubs, no gallops RESP: Normal chest excursion without splinting or tachypnea; breath sounds are diminished diffusely with expiratory wheezing but no rhonchi, no rales, no respiratory distress, no hypoxia at rest ABD/GI: Normal bowel sounds; non-distended; soft, non-tender, no rebound, no guarding BACK:  The back appears normal and is non-tender to palpation, there is no CVA tenderness EXT: Normal ROM in all joints; non-tender to palpation; no edema; normal capillary refill; no  cyanosis; patient is status post right AKA, she has chronic left shin ulcers therein. Stages of healing with no acute infection   SKIN: Normal color for age and race; warm NEURO: Moves all extremities equally PSYCH: The patient's mood and manner are appropriate. Grooming and personal hygiene are appropriate.  MEDICAL DECISION MAKING: Patient here with shortness of breath. Differential diagnosis includes COPD exacerbation, pneumonia, CHF, ACS, pulmonary bolus. We'll obtain labs, EKG, chest x-ray. We'll give continuous albuterol, Solu-Medrol.  Will ambulate patient after continuous treatment.  ED PROGRESS: Patient's labs are reassuring. Her troponin is negative. BNP is less than 500. Chest x-ray shows no infiltrate, edema or pneumothorax. After continuous laser treatment, patient was ambulated but became extremely symptomatic with only walking several 100  feet. Her oxygen saturation immediately dropped to 87-88% on room air. She does not have a nebulizer machine at home. She also does not have oxygen at home. At this time given patient is this symptomatic with ambulation, I feel she needs admission for mild COPD exacerbation. We'll give DuoNeb treatments. Her PCP is Dr. Bea Graff in [place].  Will admit to medicine. Patient agrees with plan.    EKG Interpretation  Date/Time:  Tuesday Jul 03 2013 16:44:45 EDT Ventricular Rate:  64 PR Interval:    QRS Duration: 94 QT Interval:  453 QTC Calculation: 467 R Axis:   -45 Text Interpretation:  NSR with sinus arrhythmia and 1st degree AV block Low voltage, precordial leads Unchanged compared to prior EKG Jan 2014 Confirmed by [place],  DO, Kory Panjwani [phone]) on 07/03/2013 5:06:23 PM          [place], DO 07/03/13 2015

## 2013-07-03 NOTE — ED Notes (Signed)
Pt alert, arrives from home, c/o sob, onset was Saturday, hx COPD, hx chf, resp even, mild labored

## 2013-07-03 NOTE — ED Notes (Signed)
Pt ambulated down the hall and back to her room, O2 stayed between 88-92

## 2013-07-03 NOTE — ED Notes (Signed)
Initial Contact - pt to [facility]13 with family, changed to hospital gown, placed to cardiac/02 monitor.  Pt reports SOB "for about a week now", cough starting this AM prod of "frothy clear" sputum. Pt endorses orthopnea and DOE.  Pt denies CP/palpitations, fevers/chills, n/v/d/c.  A+Ox4.  Skin PWD.  LS dim throughout R>L.  Congested cough noted during assessment.  NAD.

## 2013-07-03 NOTE — ED Notes (Signed)
Pt receiving hour long neb treatment. Pt to be walked after treatment is finished

## 2013-07-03 NOTE — H&P (Signed)
[facility] History and Physical  [name] VOH:[id number] DOB: 1945-06-01 DOA: 07/03/2013  Referring physician: EDP PCP: Gilford Rile, MD   Chief Complaint: SOB   HPI: [name] is a 68 y.o. female h/o COPD, CHF, PAD s/p R AKA.  She presents to ED with c/o SOB.  Symptoms onset on Sat and have been persistent and slightly worsening since then.  Some chest tightness, cough, and clear sputum production.  No peripheral edema, no fevers, does have 1 sick contact (her physical therapist) who has similar symptoms.  She has a pulse oximeter at home and her O2 sat after exertion has been in the low 80s she states.  Review of Systems: Systems reviewed.  As above, otherwise negative  Past Medical History  Diagnosis Date  . Asthma   . GERD (gastroesophageal reflux disease)   . COPD (chronic obstructive pulmonary disease) with emphysema   . History of TIA (transient ischemic attack)     PER MRI 2009-- NO RESIDUAL  . Osteoarthritis   . Rheumatoid arthritis(714.0)   . DJD (degenerative joint disease)   . B12 deficiency anemia   . History of DVT of lower extremity     RIGHT LOWER LEG THEN MOVED TO GOIN S/P REMOVAL  . S/P AKA (above knee amputation) unilateral     RIGHT 12-17-2010  . Ulcer of left lower leg   . PAF (paroxysmal atrial fibrillation)   . S/P ablation of atrial fibrillation   . First degree heart block   . History of CHF (congestive heart failure)   . PONV (postoperative nausea and vomiting)   . Chronic venous insufficiency   . Venous insufficiency   . Peripheral arterial disease     nonhealing left foot ulcer   Past Surgical History  Procedure Laterality Date  . Cardiac electrophysiology mapping and ablation  APRIL 2009    A-FIB ABLATION AND TEE ( SLIGHTLY DILATED ATRIA, NORMAL LV FUNCTION AND VALVES)  . Hematoma evacuation  2009    From right groin  . Trigger finger release  2011  . Amputation  12/17/2010    Procedure: AMPUTATION ABOVE KNEE;   Surgeon: Mauri Pole;  Location: [facility];  Service: Orthopedics;  Laterality: Right;  . Application of wound vac  12/17/2010    Procedure: APPLICATION OF WOUND VAC;  Surgeon: Mauri Pole;  Location: [facility];  Service: Orthopedics;  Laterality: Right;  wound Vac applied @ 2045 By Salli Quarry  . Amputation  04/27/2011    Procedure: AMPUTATION ABOVE KNEE;  Surgeon: Mauri Pole, MD;  Location: [facility];  Service: Orthopedics;  Laterality: Right;  Revision Right above knee amputation.  . Cardioversion  10-22-2011   [facility]    x5 times  . Amputation  02/28/2012    Procedure: AMPUTATION BELOW KNEE;  Surgeon: Mauri Pole, MD;  Location: [facility];  Service: Orthopedics;  Laterality: Right;  SCAR REVISION  . Tenosynovectomy  04-19-2012    left long and ring fingers   . Amputation Right 06/06/2012    Procedure: REVISION ABOVE RIGHT KNEE AMPUTATION, STUMP ;  Surgeon: Mauri Pole, MD;  Location: [facility];  Service: Orthopedics;  Laterality: Right;  . I&d extremity Right 06/27/2012    Procedure: IRRIGATION AND DEBRIDEMENT hematoma right AKA;  Surgeon: Mauri Pole, MD;  Location: [facility];  Service: Orthopedics;  Laterality: Right;  . Irrigation and debridement knee Right 09/12/2012    Procedure: REVISION RIGHT LEG WOUND AND PRIMARY CLOSURE ;  Surgeon: Mauri Pole, MD;  Location: [facility];  Service: Orthopedics;  Laterality: Right;  . Total knee arthroplasty Bilateral RIGHT 1996/  LEFT 1997  . Revision total knee arthroplasty Right 02-04-2010  . Cysto/ bladder bx  02-26-2003  . Resection right total knee / i & d/  placement antibiotic spacer  04-23-2010    06-23-2010--REPEAT I & D REPLACEMENT ANTIBIOTIC SPACER/   AND MULTIPLE TEETH EXTRACTIONS WITH BILATERAL MANDIBULAR REDUCTION  . Removal antibiotic spacer/  reimplantation right total knee  09-07-2010  . Open repair quadriceps tendon w/ allograft Right 12-08-2010  . Cataract extraction w/ intraocular lens  implant, bilateral    . Cholecystectomy   1996  . Transthoracic echocardiogram  12-16-2010    LVEF  60-65%/  MILD DILATED LA  &  RA/  MODERATELY DILATED RV  . Vascular surgery      REPAIR CFA  . Cardiac catheterization  2006  DR ZAN TYSON    NO CAD (PER CARDIOLOGIST NOTE , DR TYSON)  . I&d extremity Left 02/26/2013    Procedure: IRRIGATION AND DEBRIDEMENT OF LEFT LEG WITH PLACEMENT OF ACELL;  Surgeon: [name], DO;  Location: [place];  Service: Plastics;  Laterality: Left;   Social History:  reports that she quit smoking about 3 years ago. She has never used smokeless tobacco. She reports that she does not drink alcohol or use illicit drugs.  Allergies  Allergen Reactions  . Sulfa Antibiotics Anaphylaxis and Shortness Of Breath  . Adhesive [Tape] Other (See Comments)    Satiny and adhesive tape - blisters skin  . Avelox [Moxifloxacin Hcl In Nacl] Diarrhea and Nausea And Vomiting  . Penicillins Hives and Rash    Family History  Problem Relation Age of Onset  . Achondroplasia Father   . Congenital heart disease Mother   . Hypertension Mother   . Hypertension Sister      Prior to Admission medications   Medication Sig Start Date End Date Taking? Authorizing Provider  Albuterol Sulfate (PROAIR HFA IN) Inhale 2 puffs into the lungs every 6 (six) hours as needed (For shortness of breath.).    Yes Historical Provider, MD  amiodarone (PACERONE) 200 MG tablet Take 200 mg by mouth every evening.    Yes Historical Provider, MD  ascorbic acid (VITAMIN C) 1000 MG tablet Take 1,000 mg by mouth at bedtime.   Yes Historical Provider, MD  celecoxib (CELEBREX) 200 MG capsule Take 200 mg by mouth 2 (two) times daily.    Yes Historical Provider, MD  CRANBERRY PO Take 2 tablets by mouth at bedtime.   Yes Historical Provider, MD  dabigatran (PRADAXA) 150 MG CAPS Take 150 mg by mouth 2 (two) times daily.    Yes Historical Provider, MD  dimenhyDRINATE (DRAMAMINE) 50 MG tablet Take 50 mg by mouth at bedtime as needed  (sleep).   Yes Historical Provider, MD  diphenhydrAMINE (BENADRYL) 25 mg capsule Take 50 mg by mouth at bedtime as needed for itching or sleep.   Yes Historical Provider, MD  Fluticasone-Salmeterol (ADVAIR DISKUS) 100-50 MCG/DOSE AEPB Inhale 1 puff into the lungs 2 (two) times daily.   Yes Historical Provider, MD  HYDROcodone-acetaminophen (NORCO) 7.5-325 MG per tablet Take 1-2 tablets by mouth every 4 (four) hours as needed for pain. 09/14/12  Yes Lucille Passy Babish, PA-C  hydroxypropyl methylcellulose (ISOPTO TEARS) 2.5 % ophthalmic solution Place 1 drop into both eyes daily as needed (For dry eyes or allergies.). As needed   Yes Historical  Provider, MD  methocarbamol (ROBAXIN) 500 MG tablet Take 1 tablet (500 mg total) by mouth every 6 (six) hours as needed (muscle spasms). 09/14/12  Yes Matthew Scott Babish, PA-C  MYRBETRIQ 50 MG TB24 tablet Take 50 mg by mouth daily.  04/26/13  Yes Historical Provider, MD  omeprazole (PRILOSEC) 20 MG capsule Take 20 mg by mouth at bedtime.   Yes Historical Provider, MD  pregabalin (LYRICA) 50 MG capsule Take 50 mg by mouth 2 (two) times daily.    Yes Historical Provider, MD  pyridoxine (B-6) 100 MG tablet Take 100 mg by mouth every evening.    Yes Historical Provider, MD  sodium chloride (OCEAN) 0.65 % SOLN nasal spray Place 2 sprays into both nostrils as needed for congestion.   Yes Historical Provider, MD  traZODone (DESYREL) 50 MG tablet Take 50 mg by mouth at bedtime.   Yes Historical Provider, MD  traMADol (ULTRAM) 50 MG tablet Take 50 mg by mouth at bedtime as needed.  04/09/13   Historical Provider, MD   Physical Exam: Filed Vitals:   07/03/13 2004  BP: 135/58  Pulse: 90  Temp: 97.6 F (36.4 C)  Resp: 17    BP 135/58  Pulse 90  Temp(Src) 97.6 F (36.4 C) (Oral)  Resp 17  SpO2 94%  General Appearance:    Alert, oriented, no distress, appears stated age  Head:    Normocephalic, atraumatic  Eyes:    PERRL, EOMI, sclera non-icteric        Nose:    Nares without drainage or epistaxis. Mucosa, turbinates normal  Throat:   Moist mucous membranes. Oropharynx without erythema or exudate.  Neck:   Supple. No carotid bruits.  No thyromegaly.  No lymphadenopathy.   Back:     No CVA tenderness, no spinal tenderness  Lungs:     Bilateral Rales  Chest wall:    No tenderness to palpitation  Heart:    Regular rate and rhythm without murmurs, gallops, rubs  Abdomen:     Soft, non-tender, nondistended, normal bowel sounds, no organomegaly  Genitalia:    deferred  Rectal:    deferred  Extremities:   No clubbing, cyanosis or edema, R AKA, Chronic L shin ulcers do not appear infected  Pulses:   2+ and symmetric all extremities  Skin:   Skin color, texture, turgor normal, no rashes or lesions  Lymph nodes:   Cervical, supraclavicular, and axillary nodes normal  Neurologic:   CNII-XII intact. Normal strength, sensation and reflexes      throughout    Labs on Admission:  Basic Metabolic Panel:  Recent Labs Lab 07/03/13 1731  NA 139  K 4.1  CL 100  CO2 25  GLUCOSE 94  BUN 13  CREATININE 0.77  CALCIUM 9.0   Liver Function Tests: No results found for this basename: AST, ALT, ALKPHOS, BILITOT, PROT, ALBUMIN,  in the last 168 hours No results found for this basename: LIPASE, AMYLASE,  in the last 168 hours No results found for this basename: AMMONIA,  in the last 168 hours CBC:  Recent Labs Lab 07/03/13 1731  WBC 4.3  NEUTROABS 2.5  HGB 11.4*  HCT 32.8*  MCV 104.1*  PLT 253   Cardiac Enzymes: No results found for this basename: CKTOTAL, CKMB, CKMBINDEX, TROPONINI,  in the last 168 hours  BNP (last 3 results)  Recent Labs  07/03/13 1739  PROBNP 353.2*   CBG: No results found for this basename: GLUCAP,  in the last 168  hours  Radiological Exams on Admission: Dg Chest Port 1 View  07/03/2013   CLINICAL DATA:  Shortness of breath. Chest tightness in cough. COPD.  EXAM: PORTABLE CHEST - 1 VIEW  COMPARISON:  08/28/2012   FINDINGS: Emphysema noted. Cardiac and mediastinal margins appear normal. The lungs appear otherwise clear.  Lower cervical spondylosis. Degenerative left glenohumeral arthropathy.  IMPRESSION: 1. Emphysema.   Electronically Signed   By: Sherryl Barters M.D.   On: 07/03/2013 20:10    EKG: Independently reviewed.  Assessment/Plan Principal Problem:   COPD exacerbation Active Problems:   Atrial fibrillation with RVR   COPD (chronic obstructive pulmonary disease)   Chronic CHF   1. COPD exacerbation - on prednisone, got 1 dose of solumedrol in ED, O2, adult wheeze protocol, and doxycycline. 2. A.Fib RVR - continue home meds including amiodarone and pradaxa 3. H/o Chronic CHF - no evidence of fluid overload at this time.    Code Status: Full  Family Communication: No family in room Disposition Plan: Admit to inpatient   Time spent: 25 min  [facility] Pager [phone]  If 7AM-7PM, please contact the day team taking care of the patient [email or web address] Password [facility] 07/03/2013, 8:29 PM

## 2013-07-04 DIAGNOSIS — K219 Gastro-esophageal reflux disease without esophagitis: Secondary | ICD-10-CM

## 2013-07-04 DIAGNOSIS — D638 Anemia in other chronic diseases classified elsewhere: Secondary | ICD-10-CM

## 2013-07-04 DIAGNOSIS — S88919A Complete traumatic amputation of unspecified lower leg, level unspecified, initial encounter: Secondary | ICD-10-CM

## 2013-07-04 DIAGNOSIS — L03119 Cellulitis of unspecified part of limb: Secondary | ICD-10-CM

## 2013-07-04 DIAGNOSIS — L02419 Cutaneous abscess of limb, unspecified: Secondary | ICD-10-CM

## 2013-07-04 LAB — BASIC METABOLIC PANEL
BUN: 20 mg/dL (ref 6–23)
CALCIUM: 9.1 mg/dL (ref 8.4–10.5)
CO2: 24 mEq/L (ref 19–32)
CREATININE: 0.78 mg/dL (ref 0.50–1.10)
Chloride: 100 mEq/L (ref 96–112)
GFR calc non Af Amer: 85 mL/min — ABNORMAL LOW (ref >90)
Glucose, Bld: 182 mg/dL — ABNORMAL HIGH (ref 70–99)
Potassium: 3.8 mEq/L (ref 3.7–5.3)
SODIUM: 139 meq/L (ref 137–147)

## 2013-07-04 LAB — CBC
HCT: 31.8 % — ABNORMAL LOW (ref 36.0–46.0)
Hemoglobin: 10.9 g/dL — ABNORMAL LOW (ref 12.0–15.0)
MCH: 36 pg — AB (ref 26.0–34.0)
MCHC: 34.3 g/dL (ref 30.0–36.0)
MCV: 105 fL — AB (ref 78.0–100.0)
PLATELETS: 234 10*3/uL (ref 150–400)
RBC: 3.03 MIL/uL — ABNORMAL LOW (ref 3.87–5.11)
RDW: 14 % (ref 11.5–15.5)
WBC: 3.4 10*3/uL — ABNORMAL LOW (ref 4.0–10.5)

## 2013-07-04 MED ORDER — FUROSEMIDE 10 MG/ML IJ SOLN
40.0000 mg | Freq: Once | INTRAMUSCULAR | Status: AC
  Administered 2013-07-04: 40 mg via INTRAVENOUS
  Filled 2013-07-04: qty 4

## 2013-07-04 MED ORDER — POTASSIUM CHLORIDE CRYS ER 20 MEQ PO TBCR
40.0000 meq | EXTENDED_RELEASE_TABLET | Freq: Once | ORAL | Status: AC
  Administered 2013-07-04: 40 meq via ORAL
  Filled 2013-07-04: qty 2

## 2013-07-04 MED ORDER — LEVOFLOXACIN 750 MG PO TABS
750.0000 mg | ORAL_TABLET | Freq: Every day | ORAL | Status: DC
  Administered 2013-07-04 – 2013-07-06 (×3): 750 mg via ORAL
  Filled 2013-07-04 (×3): qty 1

## 2013-07-04 NOTE — Progress Notes (Signed)
[facility] PROGRESS NOTE   [name] ERD:[id number] DOB: 02-19-1945 DOA: 07/03/2013 PCP: Gilford Rile, MD  HPI/Subjective: Feels better than yesterday, denies any significant complaints.  Assessment/Plan: Principal Problem:   COPD exacerbation Active Problems:   Atrial fibrillation with RVR   COPD (chronic obstructive pulmonary disease)   Chronic CHF   Acute hypoxic respiratory failure -O2 saturation went down to 88 with ambulation. -This is secondary to acute COPD exacerbation. -Currently on 2 L of oxygen, wean to room air as tolerated.  Acute COPD exacerbation -Very minimal wheezing, shortness of breath. -Started on antibiotics, steroids. -Continue bronchodilators, mucolytics and antitussives as needed.  Atrial fibrillation -Weight is controlled with amiodarone. -Patient is on PRADAXA.  Chronic CHF -Likely chronic diastolic CHF, last 2-D echocardiogram from 2012 showed ejection fraction of 60%. -No evidence of decompensation, but because of shortness of breath, I will give extra doses of Lasix.  Macrocytic anemia -Check TSH, LFTs, B12 and folate.  Code Status: Full code Family Communication: Plan discussed with the patient. Disposition Plan: Remains inpatient   Consultants:  None  Procedures:  None  Antibiotics:  Levaquin   Objective: Filed Vitals:   07/04/13 0530  BP: 117/49  Pulse: 78  Temp: 97.5 F (36.4 C)  Resp: 18    Intake/Output Summary (Last 24 hours) at 07/04/13 1313 Last data filed at 07/04/13 1012  Gross per 24 hour  Intake    840 ml  Output      0 ml  Net    840 ml   Filed Weights   07/03/13 2130  Weight: 75.116 kg (165 lb 9.6 oz)    Exam: General: Alert and awake, oriented x3, not in any acute distress. HEENT: anicteric sclera, pupils reactive to light and accommodation, EOMI CVS: S1-S2 clear, no murmur rubs or gallops Chest: clear to auscultation bilaterally, no wheezing, rales or rhonchi Abdomen:  soft nontender, nondistended, normal bowel sounds, no organomegaly Extremities: no cyanosis, clubbing or edema noted bilaterally Neuro: Cranial nerves II-XII intact, no focal neurological deficits  Data Reviewed: Basic Metabolic Panel:  Recent Labs Lab 07/03/13 1731 07/04/13 0440  NA 139 139  K 4.1 3.8  CL 100 100  CO2 25 24  GLUCOSE 94 182*  BUN 13 20  CREATININE 0.77 0.78  CALCIUM 9.0 9.1   Liver Function Tests: No results found for this basename: AST, ALT, ALKPHOS, BILITOT, PROT, ALBUMIN,  in the last 168 hours No results found for this basename: LIPASE, AMYLASE,  in the last 168 hours No results found for this basename: AMMONIA,  in the last 168 hours CBC:  Recent Labs Lab 07/03/13 1731 07/04/13 0440  WBC 4.3 3.4*  NEUTROABS 2.5  --   HGB 11.4* 10.9*  HCT 32.8* 31.8*  MCV 104.1* 105.0*  PLT 253 234   Cardiac Enzymes: No results found for this basename: CKTOTAL, CKMB, CKMBINDEX, TROPONINI,  in the last 168 hours BNP (last 3 results)  Recent Labs  07/03/13 1739  PROBNP 353.2*   CBG: No results found for this basename: GLUCAP,  in the last 168 hours  Micro No results found for this or any previous visit (from the past 240 hour(s)).   Studies: Dg Chest Port 1 View  07/03/2013   CLINICAL DATA:  Shortness of breath. Chest tightness in cough. COPD.  EXAM: PORTABLE CHEST - 1 VIEW  COMPARISON:  08/28/2012  FINDINGS: Emphysema noted. Cardiac and mediastinal margins appear normal. The lungs appear otherwise clear.  Lower cervical spondylosis. Degenerative left glenohumeral arthropathy.  IMPRESSION: 1. Emphysema.   Electronically Signed   By: Sherryl Barters M.D.   On: 07/03/2013 20:10    Scheduled Meds: . amiodarone  200 mg Oral QPM  . celecoxib  200 mg Oral BID  . dabigatran  150 mg Oral BID  . doxycycline  100 mg Oral Q12H  . ipratropium-albuterol  3 mL Nebulization QID  . mirabegron ER  50 mg Oral Daily  . mometasone-formoterol  2 puff Inhalation BID  .  pantoprazole  40 mg Oral Daily  . predniSONE  50 mg Oral Q breakfast  . pregabalin  50 mg Oral BID  . vitamin B-6  100 mg Oral q1800  . traZODone  50 mg Oral QHS   Continuous Infusions: . albuterol Stopped (07/03/13 1955)       Time spent: 35 minutes    Ras Kollman [facility]  [facility] Pager [phone] If 7PM-7AM, please contact night-coverage at [email or web address], password [facility] 07/04/2013, 1:13 PM  LOS: 1 day

## 2013-07-04 NOTE — Evaluation (Signed)
Physical Therapy Evaluation Patient Details Name: KEYAUNA GRAEFE MRN: [id number] DOB: 1945-07-24 Today's Date: 07/04/2013   History of Present Illness  68 y.o.female with h/o COPD, CHF, PAD s/p R AKA admitted with SOB and diagnosed with COPD exacerbation.  Clinical Impression  Pt admitted with above.  Pt currently with functional limitations due to the deficits listed below (see PT Problem List).  Pt will benefit from skilled PT to increase their independence and safety with mobility to allow discharge to the venue listed below.  Pt ambulated in hallway and SpO2 95% on room air.  Pt hopes to d/c home tomorrow however would like to work with PT again prior to d/c.  Pt reports she has been ambulating 160 x2 at outpatient PT.     Follow Up Recommendations Outpatient PT (continue outpatient)    Equipment Recommendations  None recommended by PT    Recommendations for Other Services       Precautions / Restrictions Precautions Precautions: Fall Precaution Comments: R prosthesis      Mobility  Bed Mobility               General bed mobility comments: pt up in her w/c upon arrival  Transfers Overall transfer level: Needs assistance Equipment used: Rolling walker (2 wheeled) Transfers: Sit to/from Stand Sit to Stand: Min guard         General transfer comment: pt donned her prosthesis independently and correctly prior to standing, min/guard for safety  Ambulation/Gait Ambulation/Gait assistance: Min guard Ambulation Distance (Feet): 60 Feet Assistive device: Rolling walker (2 wheeled) Gait Pattern/deviations: Step-through pattern;Trunk flexed Gait velocity: decr   General Gait Details: pt reports no SOB just fatigue limiting distance however SpO2 95% during gait  Stairs            Wheelchair Mobility    Modified Rankin (Stroke Patients Only)       Balance                                             Pertinent Vitals/Pain SpO2  at rest room air 98% SpO2 room air during ambulation 95%    Home Living Family/patient expects to be discharged to:: Private residence Living Arrangements: Spouse/significant other   Type of Home: Apartment Home Access: Elevator     Home Layout: One level Home Equipment: Wheelchair - power;Bedside commode;Walker - 2 wheels;Wheelchair - manual;Grab bars - tub/shower;Shower seat Additional Comments: moving into apt a few doors down that will allow w/c into bathroom for safer transfer, new manual w/c in proper working order    Prior Function Level of Independence: Independent with assistive device(s)               Hand Dominance        Extremity/Trunk Assessment               Lower Extremity Assessment: Overall WFL for tasks assessed (R AKA)         Communication   Communication: No difficulties  Cognition Arousal/Alertness: Awake/alert Behavior During Therapy: WFL for tasks assessed/performed Overall Cognitive Status: Within Functional Limits for tasks assessed                      General Comments      Exercises        Assessment/Plan    PT Assessment Patient needs continued PT  services  PT Diagnosis Difficulty walking   PT Problem List Decreased mobility;Decreased activity tolerance;Cardiopulmonary status limiting activity  PT Treatment Interventions DME instruction;Gait training;Functional mobility training;Therapeutic activities;Therapeutic exercise;Patient/family education   PT Goals (Current goals can be found in the Care Plan section) Acute Rehab PT Goals Patient Stated Goal: get back to outpatient PT and continue gait training PT Goal Formulation: With patient Time For Goal Achievement: 07/11/13 Potential to Achieve Goals: Good    Frequency Min 3X/week   Barriers to discharge        Co-evaluation               End of Session   Activity Tolerance: Patient limited by fatigue Patient left: in chair;with call bell/phone  within reach           Time: 1553-1610 PT Time Calculation (min): 17 min   Charges:   PT Evaluation $Initial PT Evaluation Tier I: 1 Procedure PT Treatments $Gait Training: 8-22 mins   PT G CodesJunius Argyle 07/04/2013, 4:47 PM Carmelia Bake, PT, DPT 07/04/2013 Pager: [phone]

## 2013-07-04 NOTE — Progress Notes (Signed)
RT assessed PT per RN request. RT found PT to have upper airway expiratory wheeze (otherwise lungs clear-diminished). After discussion, PT discloses that she coughs when she eats (since January 2015). RT recommended to RN that we place a consultation for swallow study. Current Sp02 on RA 92%, RR 16.

## 2013-07-05 ENCOUNTER — Ambulatory Visit: Payer: Self-pay | Admitting: Cardiology

## 2013-07-05 ENCOUNTER — Encounter: Payer: Self-pay | Admitting: Physical Therapy

## 2013-07-05 LAB — COMPREHENSIVE METABOLIC PANEL
ALBUMIN: 3.3 g/dL — AB (ref 3.5–5.2)
ALK PHOS: 69 U/L (ref 39–117)
ALT: 12 U/L (ref 0–35)
AST: 15 U/L (ref 0–37)
BUN: 26 mg/dL — ABNORMAL HIGH (ref 6–23)
CHLORIDE: 99 meq/L (ref 96–112)
CO2: 26 mEq/L (ref 19–32)
Calcium: 9.1 mg/dL (ref 8.4–10.5)
Creatinine, Ser: 1.04 mg/dL (ref 0.50–1.10)
GFR calc Af Amer: 63 mL/min — ABNORMAL LOW (ref >90)
GFR calc non Af Amer: 54 mL/min — ABNORMAL LOW (ref >90)
Glucose, Bld: 123 mg/dL — ABNORMAL HIGH (ref 70–99)
POTASSIUM: 5.2 meq/L (ref 3.7–5.3)
Sodium: 136 mEq/L — ABNORMAL LOW (ref 137–147)
Total Bilirubin: 0.2 mg/dL — ABNORMAL LOW (ref 0.3–1.2)
Total Protein: 5.9 g/dL — ABNORMAL LOW (ref 6.0–8.3)

## 2013-07-05 LAB — CBC
HCT: 31.1 % — ABNORMAL LOW (ref 36.0–46.0)
Hemoglobin: 10.2 g/dL — ABNORMAL LOW (ref 12.0–15.0)
MCH: 34.9 pg — ABNORMAL HIGH (ref 26.0–34.0)
MCHC: 32.8 g/dL (ref 30.0–36.0)
MCV: 106.5 fL — ABNORMAL HIGH (ref 78.0–100.0)
PLATELETS: 209 10*3/uL (ref 150–400)
RBC: 2.92 MIL/uL — ABNORMAL LOW (ref 3.87–5.11)
RDW: 14.2 % (ref 11.5–15.5)
WBC: 12 10*3/uL — AB (ref 4.0–10.5)

## 2013-07-05 LAB — TSH: TSH: 0.289 u[IU]/mL — ABNORMAL LOW (ref 0.350–4.500)

## 2013-07-05 LAB — T4, FREE: FREE T4: 1.44 ng/dL (ref 0.80–1.80)

## 2013-07-05 LAB — T3: T3, Total: 72.4 ng/dl — ABNORMAL LOW (ref 80.0–204.0)

## 2013-07-05 LAB — FOLATE: Folate: 7 ng/mL

## 2013-07-05 LAB — VITAMIN B12: VITAMIN B 12: 320 pg/mL (ref 211–911)

## 2013-07-05 MED ORDER — IPRATROPIUM-ALBUTEROL 0.5-2.5 (3) MG/3ML IN SOLN
3.0000 mL | Freq: Three times a day (TID) | RESPIRATORY_TRACT | Status: DC
  Administered 2013-07-06: 3 mL via RESPIRATORY_TRACT
  Filled 2013-07-05: qty 3

## 2013-07-05 MED ORDER — ZOLPIDEM TARTRATE 5 MG PO TABS
5.0000 mg | ORAL_TABLET | Freq: Every evening | ORAL | Status: DC | PRN
  Administered 2013-07-05 (×2): 5 mg via ORAL
  Filled 2013-07-05 (×2): qty 1

## 2013-07-05 MED ORDER — DIPHENHYDRAMINE HCL 25 MG PO CAPS: 25.0000 mg | ORAL_CAPSULE | Freq: Every evening | ORAL | Status: DC | PRN

## 2013-07-05 NOTE — Progress Notes (Addendum)
[facility] PROGRESS NOTE   [name] WJX:[id number] DOB: Aug 09, 1945 DOA: 07/03/2013 PCP: Gilford Rile, MD  HPI/Subjective: Cough and sputum production are minimal today, much better than yesterday. Keeps sats above 90 off of oxygen.  Assessment/Plan: Principal Problem:   COPD exacerbation Active Problems:   Atrial fibrillation with RVR   COPD (chronic obstructive pulmonary disease)   Chronic CHF   Acute hypoxic respiratory failure -O2 saturation went down to 88 with ambulation. -This is secondary to acute COPD exacerbation. -Currently on 2 L of oxygen, wean to room air as tolerated. Weaned off to room air.  Acute COPD exacerbation -Very minimal wheezing, shortness of breath. -Started on antibiotics, steroids. -Continue bronchodilators, mucolytics and antitussives as needed.  Atrial fibrillation -Rtae is controlled with amiodarone. -Patient is on PRADAXA.  Chronic CHF -Likely chronic diastolic CHF, last 2-D echocardiogram from 2012 showed ejection fraction of 60%. -No evidence of decompensation, one dose of Lasix given. Potassium is marginally high today.  Macrocytic anemia -Normal LFTs, B12 and folate pending, decreased TSH.  Low TSH -TSH is 0.289, patient does not have symptoms of hyperthyroidism, please note that patient is on amiodarone. -Check free T4 and total T3.  Code Status: Full code Family Communication: Plan discussed with the patient. Disposition Plan: Remains inpatient   Consultants:  None  Procedures:  None  Antibiotics:  Levaquin   Objective: Filed Vitals:   07/05/13 0630  BP: 106/69  Pulse: 67  Temp: 98.1 F (36.7 C)  Resp: 18    Intake/Output Summary (Last 24 hours) at 07/05/13 1038 Last data filed at 07/05/13 1013  Gross per 24 hour  Intake    720 ml  Output      0 ml  Net    720 ml   Filed Weights   07/03/13 2130  Weight: 75.116 kg (165 lb 9.6 oz)    Exam: General: Alert and awake, oriented x3,  not in any acute distress. HEENT: anicteric sclera, pupils reactive to light and accommodation, EOMI CVS: S1-S2 clear, no murmur rubs or gallops Chest: clear to auscultation bilaterally, no wheezing, rales or rhonchi Abdomen: soft nontender, nondistended, normal bowel sounds, no organomegaly Extremities: no cyanosis, clubbing or edema noted bilaterally Neuro: Cranial nerves II-XII intact, no focal neurological deficits  Data Reviewed: Basic Metabolic Panel:  Recent Labs Lab 07/03/13 1731 07/04/13 0440 07/05/13 0424  NA 139 139 136*  K 4.1 3.8 5.2  CL 100 100 99  CO2 25 24 26   GLUCOSE 94 182* 123*  BUN 13 20 26*  CREATININE 0.77 0.78 1.04  CALCIUM 9.0 9.1 9.1   Liver Function Tests:  Recent Labs Lab 07/05/13 0424  AST 15  ALT 12  ALKPHOS 69  BILITOT 0.2*  PROT 5.9*  ALBUMIN 3.3*   No results found for this basename: LIPASE, AMYLASE,  in the last 168 hours No results found for this basename: AMMONIA,  in the last 168 hours CBC:  Recent Labs Lab 07/03/13 1731 07/04/13 0440 07/05/13 0424  WBC 4.3 3.4* 12.0*  NEUTROABS 2.5  --   --   HGB 11.4* 10.9* 10.2*  HCT 32.8* 31.8* 31.1*  MCV 104.1* 105.0* 106.5*  PLT 253 234 209   Cardiac Enzymes: No results found for this basename: CKTOTAL, CKMB, CKMBINDEX, TROPONINI,  in the last 168 hours BNP (last 3 results)  Recent Labs  07/03/13 1739  PROBNP 353.2*   CBG: No results found for this basename: GLUCAP,  in the last 168 hours  Micro No results found  for this or any previous visit (from the past 240 hour(s)).   Studies: Dg Chest Port 1 View  07/03/2013   CLINICAL DATA:  Shortness of breath. Chest tightness in cough. COPD.  EXAM: PORTABLE CHEST - 1 VIEW  COMPARISON:  08/28/2012  FINDINGS: Emphysema noted. Cardiac and mediastinal margins appear normal. The lungs appear otherwise clear.  Lower cervical spondylosis. Degenerative left glenohumeral arthropathy.  IMPRESSION: 1. Emphysema.   Electronically Signed   By:  Sherryl Barters M.D.   On: 07/03/2013 20:10    Scheduled Meds: . amiodarone  200 mg Oral QPM  . celecoxib  200 mg Oral BID  . dabigatran  150 mg Oral BID  . ipratropium-albuterol  3 mL Nebulization QID  . levofloxacin  750 mg Oral Daily  . mirabegron ER  50 mg Oral Daily  . mometasone-formoterol  2 puff Inhalation BID  . pantoprazole  40 mg Oral Daily  . predniSONE  50 mg Oral Q breakfast  . pregabalin  50 mg Oral BID  . vitamin B-6  100 mg Oral q1800  . traZODone  50 mg Oral QHS   Continuous Infusions: . albuterol Stopped (07/03/13 1955)       Time spent: 35 minutes    [name] [facility]  [facility] Pager [phone] If 7PM-7AM, please contact night-coverage at [email or web address], password [facility] 07/05/2013, 10:38 AM  LOS: 2 days

## 2013-07-05 NOTE — Evaluation (Signed)
Clinical/Bedside Swallow Evaluation Patient Details  Name: [name] MRN: [id number] Date of Birth: 1945/04/01  Today's Date: 07/05/2013 Time: 1032-1115 SLP Time Calculation (min): 43 min  Past Medical History:  Past Medical History  Diagnosis Date  . Asthma   . GERD (gastroesophageal reflux disease)   . COPD (chronic obstructive pulmonary disease) with emphysema   . History of TIA (transient ischemic attack)     PER MRI 2009-- NO RESIDUAL  . Osteoarthritis   . Rheumatoid arthritis(714.0)   . DJD (degenerative joint disease)   . B12 deficiency anemia   . History of DVT of lower extremity     RIGHT LOWER LEG THEN MOVED TO GOIN S/P REMOVAL  . S/P AKA (above knee amputation) unilateral     RIGHT 12-17-2010  . Ulcer of left lower leg   . PAF (paroxysmal atrial fibrillation)   . S/P ablation of atrial fibrillation   . First degree heart block   . History of CHF (congestive heart failure)   . PONV (postoperative nausea and vomiting)   . Chronic venous insufficiency   . Venous insufficiency   . Peripheral arterial disease     nonhealing left foot ulcer   Past Surgical History:  Past Surgical History  Procedure Laterality Date  . Cardiac electrophysiology mapping and ablation  APRIL 2009    A-FIB ABLATION AND TEE ( SLIGHTLY DILATED ATRIA, NORMAL LV FUNCTION AND VALVES)  . Hematoma evacuation  2009    From right groin  . Trigger finger release  2011  . Amputation  12/17/2010    Procedure: AMPUTATION ABOVE KNEE;  Surgeon: Mauri Pole;  Location: [facility];  Service: Orthopedics;  Laterality: Right;  . Application of wound vac  12/17/2010    Procedure: APPLICATION OF WOUND VAC;  Surgeon: Mauri Pole;  [facility];  Service: Orthopedics;  Laterality: Right;  wound Vac applied @ 2045 By Salli Quarry  . Amputation  04/27/2011    Procedure: AMPUTATION ABOVE KNEE;  Surgeon: Mauri Pole, MD;  Location[facility];  Service: Orthopedics;  Laterality: Right;  Revision Right  above knee amputation.  . Cardioversion  10-22-2011   [facility]    x5 times  . Amputation  02/28/2012    Procedure: AMPUTATION BELOW KNEE;  Surgeon: Mauri Pole, MD;  Location: [facility];  Service: Orthopedics;  Laterality: Right;  SCAR REVISION  . Tenosynovectomy  04-19-2012    left long and ring fingers   . Amputation Right 06/06/2012    Procedure: REVISION ABOVE RIGHT KNEE AMPUTATION, STUMP ;  Surgeon: Mauri Pole, MD;  [facility];  Service: Orthopedics;  Laterality: Right;  . I&d extremity Right 06/27/2012    Procedure: IRRIGATION AND DEBRIDEMENT hematoma right AKA;  Surgeon: Mauri Pole, MD;  Location: [facility];  Service: Orthopedics;  Laterality: Right;  . Irrigation and debridement knee Right 09/12/2012    Procedure: REVISION RIGHT LEG WOUND AND PRIMARY CLOSURE ;  Surgeon: Mauri Pole, MD;  Location[facility];  Service: Orthopedics;  Laterality: Right;  . Total knee arthroplasty Bilateral RIGHT 1996/  LEFT 1997  . Revision total knee arthroplasty Right 02-04-2010  . Cysto/ bladder bx  02-26-2003  . Resection right total knee / i & d/  placement antibiotic spacer  04-23-2010    06-23-2010--REPEAT I & D REPLACEMENT ANTIBIOTIC SPACER/   AND MULTIPLE TEETH EXTRACTIONS WITH BILATERAL MANDIBULAR REDUCTION  . Removal antibiotic spacer/  reimplantation right total knee  09-07-2010  .  Open repair quadriceps tendon w/ allograft Right 12-08-2010  . Cataract extraction w/ intraocular lens  implant, bilateral    . Cholecystectomy  1996  . Transthoracic echocardiogram  12-16-2010    LVEF  60-65%/  MILD DILATED LA  &  RA/  MODERATELY DILATED RV  . Vascular surgery      REPAIR CFA  . Cardiac catheterization  2006  DR ZAN TYSON    NO CAD (PER CARDIOLOGIST NOTE , DR TYSON)  . I&d extremity Left 02/26/2013    Procedure: IRRIGATION AND DEBRIDEMENT OF LEFT LEG WITH PLACEMENT OF ACELL;  Surgeon: [name], DO;  Location: [place];  Service: Plastics;  Laterality:  Left;   HPI:  68 yo femqale [facility] with respiratory deficits, decreased oxygen saturation.  PMH + for allergies s/p allergy testing with pt being allergic to "everything" per her statement- she now takes Allegra daily for one month without improvement in nasal drainage.  Pt also with h/o COPD/emphysema, CHF, Afib with AVR.  She is followed by Dr Welford Roche in [facility] for pulmonology and Dr Bea Graff as Primary in [place] per her statement.  Pt reports that ENT saw her within the last few months and recommended her to double her PPI  - she again denies improvement with symptoms.  MD ordered swallow evaluation due to concern for aspiration.     Assessment / Plan / Recommendation Clinical Impression  Pt with negative cranial nerve exam and no clinical indications of airway compromise/aspiration during limited po observed.  Extensive interviewing conducted revealing pt reporting pain associated with swallowing since January 2015 leading to decreased intake/weight loss.  Sensation of "burning in jaw bones" reported with acidic intake.  Choking on thin liquids reported approx twice a week since January without worsening or improvement.  Pt reports occasional sensation of stasis in pharynx with intake requiring several liquid boluses to clear- SLP questions if could be referrant sensation from distal esophagus given pt h/o taking reflux medication.    In addition, hoarse voice since January without improvement with Keflex and prednisone prescribed in February and March per pt.  Last week, pt reports having issues with minimal vomiting and copious mucus.  Allergies to "everything" c/b post nasal drip recently diagnosed for which she has taken Allegra x1 month without improvement per her statement.   Could post nasal drip be contributing to pt's chronic throat pain with intake???  SLP does not suspect primary oropharyngeal dysphagia  - recommend to consider GI referral or esophageal work up to aid pt in  managing her dysphagia symptoms- as she reports weight loss due to deficits.  SLP provided pt with education to compensation strategies for eating with chronic respirator issues and reflux precautions given pt on a PPI prior to admit.  Will sign off as all education completed.      Aspiration Risk  Mild    Diet Recommendation Regular;Thin liquid   Liquid Administration via: Cup Medication Administration: Whole meds with liquid (take with applesauce and follow with water if problematic) Supervision: Patient able to self feed Compensations: Slow rate;Small sips/bites (start meals with liquids) Postural Changes and/or Swallow Maneuvers: Seated upright 90 degrees;Upright 30-60 min after meal    Other  Recommendations Recommended Consults: Consider GI evaluation;Consider esophageal assessment Oral Care Recommendations: Oral care BID   Follow Up Recommendations  None    Frequency and Duration   n/a     Pertinent Vitals/Pain Afebrile, decreased     Swallow Study Prior Functional  Status   see [place] Date of Onset: 07/05/13 HPI: 68 yo femqale [facility] with respiratory deficits, decreased oxygen saturation.  PMH + for allergies s/p allergy testing with pt being allergic to "everything" per her statement- she now takes Allegra daily for one month without improvement in nasal drainage.  Pt also with h/o COPD/emphysema, CHF, Afib with AVR.  She is followed by Dr Welford Roche in [facility] for pulmonology and Dr Bea Graff as Primary in [place] per her statement.  Pt reports that ENT saw her within the last few months and recommended her to double her PPI  - she again denies improvement with symptoms.  MD ordered swallow evaluation due to concern for aspiration.   Type of Study: Bedside swallow evaluation Previous Swallow Assessment: none Diet Prior to this Study: Regular;Thin liquids Respiratory Status: Room air History of Recent Intubation: No Behavior/Cognition:  Alert;Cooperative;Pleasant mood Oral Cavity - Dentition: Dentures, top;Missing dentition (pt reports sensation of "having a semi in my mouth" when trying to wear lower dentures) Self-Feeding Abilities: Able to feed self Patient Positioning: Upright in chair Baseline Vocal Quality: Low vocal intensity;Hoarse Volitional Cough: Strong Volitional Swallow: Able to elicit    Oral/Motor/Sensory Function Overall Oral Motor/Sensory Function: Appears within functional limits for tasks assessed (xerostomia reported by pt)   Ice Chips Ice chips: Not tested   Thin Liquid Thin Liquid: Within functional limits Presentation: Cup;Self Fed    Nectar Thick Nectar Thick Liquid: Not tested   Honey Thick Honey Thick Liquid: Not tested   Puree Puree: Within functional limits Presentation: Self Fed;Spoon   Solid   GO    Other Comments: observed pt swallowing pills given by nurse with good tolerance       Luanna Salk, [place] [facility] SLP [phone]

## 2013-07-05 NOTE — Progress Notes (Signed)
Physical Therapy Treatment Patient Details Name: [name] MRN: [id number] DOB: October 29, 1945 Today's Date: 07/05/2013    History of Present Illness 68 y.o.female with h/o COPD, CHF, PAD s/p R AKA admitted with SOB and diagnosed with COPD exacerbation.    PT Comments    Pt pleased with progress in ambulation distance today however feels a little discouraged about continued SOB with activity despite SpO2 >92% during gait.  Pt agreeable to not ambulate at home and wait until back in outpatient setting with PT for safety.  Pt hopeful for d/c home tomorrow.   Follow Up Recommendations  Outpatient PT     Equipment Recommendations  None recommended by PT    Recommendations for Other Services       Precautions / Restrictions Precautions Precautions: Fall Precaution Comments: R prosthesis    Mobility  Bed Mobility               General bed mobility comments: pt up in her w/c upon arrival  Transfers Overall transfer level: Needs assistance Equipment used: Rolling walker (2 wheeled) Transfers: Sit to/from Stand Sit to Stand: Min assist         General transfer comment: pt donned her prosthesis independently and correctly prior to standing, assist required today due to LOB upon adjusting prosthesis  Ambulation/Gait Ambulation/Gait assistance: Min assist;Supervision Ambulation Distance (Feet): 190 Feet (total) Assistive device: Rolling walker (2 wheeled) Gait Pattern/deviations: Step-through pattern;Trunk flexed Gait velocity: decr   General Gait Details: verbal cues for smaller steps on R LE due to LOB with initially required min assist however pt progressed to supervision with w/c following, 2 seated rest breaks required due to SOB and fatigue, SpO2 93-95% room air during gait   Stairs            Wheelchair Mobility    Modified Rankin (Stroke Patients Only)       Balance                                    Cognition  Arousal/Alertness: Awake/alert Behavior During Therapy: WFL for tasks assessed/performed Overall Cognitive Status: Within Functional Limits for tasks assessed                      Exercises      General Comments        Pertinent Vitals/Pain SpO2 on room air during gait 93-95% and HR 85 bpm    Home Living                      Prior Function            PT Goals (current goals can now be found in the care plan section) Acute Rehab PT Goals Patient Stated Goal: get back to outpatient PT and continue gait training Progress towards PT goals: Progressing toward goals    Frequency  Min 3X/week    PT Plan Current plan remains appropriate    Co-evaluation             End of Session   Activity Tolerance: Patient limited by fatigue Patient left: in chair;with call bell/phone within reach     Time: 0623-7628 PT Time Calculation (min): 29 min  Charges:  $Gait Training: 23-37 mins                    G Codes:  Junius Argyle 07/05/2013, 4:38 PM Carmelia Bake, PT, DPT 07/05/2013 Pager: [phone]

## 2013-07-06 LAB — BASIC METABOLIC PANEL
BUN: 27 mg/dL — ABNORMAL HIGH (ref 6–23)
CHLORIDE: 100 meq/L (ref 96–112)
CO2: 29 meq/L (ref 19–32)
Calcium: 8.8 mg/dL (ref 8.4–10.5)
Creatinine, Ser: 1.21 mg/dL — ABNORMAL HIGH (ref 0.50–1.10)
GFR calc Af Amer: 52 mL/min — ABNORMAL LOW (ref >90)
GFR calc non Af Amer: 45 mL/min — ABNORMAL LOW (ref >90)
GLUCOSE: 97 mg/dL (ref 70–99)
POTASSIUM: 4.8 meq/L (ref 3.7–5.3)
SODIUM: 138 meq/L (ref 137–147)

## 2013-07-06 MED ORDER — PREDNISONE (PAK) 10 MG PO TABS: ORAL_TABLET | ORAL | Status: DC

## 2013-07-06 MED ORDER — LEVOFLOXACIN 750 MG PO TABS: 750.0000 mg | ORAL_TABLET | Freq: Every day | ORAL | Status: DC

## 2013-07-06 NOTE — Discharge Summary (Addendum)
Physician Discharge Summary  [name] ZOX:[id number] DOB: May 13, 1945 DOA: 07/03/2013  PCP: [name] Rile, MD  Admit date: 07/03/2013 Discharge date: 07/06/2013  Time spent: 40 minutes  Recommendations for Outpatient Follow-up:  1. Followup with primary care physician for further evaluation. 2. Continue outpatient physical therapy, ambulate as needed. 3. BMP in one week, recheck TSH, free T4 and T3 in 3 weeks.  Discharge Diagnoses:  Principal Problem:   COPD exacerbation Active Problems:   Atrial fibrillation with RVR   COPD (chronic obstructive pulmonary disease)   Chronic CHF   Discharge Condition: Stable  Diet recommendation: heart healthy diet  Filed Weights   07/03/13 2130 07/06/13 0842  Weight: 75.116 kg (165 lb 9.6 oz) 80.6 kg (177 lb 11.1 oz)    History of present illness:  [name] is a 68 y.o. female h/o COPD, CHF, PAD s/p R AKA. She presents to ED with c/o SOB. Symptoms onset on Sat and have been persistent and slightly worsening since then. Some chest tightness, cough, and clear sputum production. No peripheral edema, no fevers, does have 1 sick contact (her physical therapist) who has similar symptoms. She has a pulse oximeter at home and her O2 sat after exertion has been in the low 80s she states.   Hospital Course:   Acute hypoxic respiratory failure  -O2 saturation went down to 88% with ambulation.  -This is secondary to acute COPD exacerbation.  -Currently on 2 L of oxygen, wean to room air as tolerated. Weaned off to room air.   Acute COPD exacerbation  -Very minimal wheezing, shortness of breath.  -Started on antibiotics, steroids. Discharge on Levaquin and steroid taper. -Continue bronchodilators, mucolytics and antitussives as needed.   Atrial fibrillation  -Rtae is controlled with amiodarone.  -Patient is on PRADAXA.   Chronic CHF  -Likely chronic diastolic CHF, last 2-D echocardiogram from 2012 showed ejection fraction of  60%.  -No evidence of decompensation, one dose of Lasix given. Potassium is marginally high today.  -Creatinine is 1.21 on discharge, asked to keep herself hydrated. Check BMP within one week.  Macrocytic anemia  -Normal LFTs, B12 and folate pending, decreased TSH.   Low TSH  -TSH is 0.289, patient does not have symptoms of hyperthyroidism, please note that patient is on amiodarone.  -Is normal free T4 at 1.44 and slightly low T3 at 72.4. -Needs to recheck in 3 weeks to rule out central hypothyroidism.  Difficulty with swallowing/hoarse voice -Not necessarily dysphagia, patient seen by SLP and recommended regular consistency diet. -She was seen before by ENT and recommended nasal steroid spray. -She might need outpatient referral to gastroenterology and this is continuous.   Procedures:  None  Consultations:  None  Discharge Exam: Filed Vitals:   07/06/13 0550  BP: 99/57  Pulse: 74  Temp: 98.1 F (36.7 C)  Resp: 18   General: Alert and awake, oriented x3, not in any acute distress. HEENT: anicteric sclera, pupils reactive to light and accommodation, EOMI CVS: S1-S2 clear, no murmur rubs or gallops Chest: clear to auscultation bilaterally, no wheezing, rales or rhonchi Abdomen: soft nontender, nondistended, normal bowel sounds, no organomegaly Extremities: S/P R AKA Neuro: Cranial nerves II-XII intact, no focal neurological deficits  Discharge Instructions You were cared for by a hospitalist during your hospital stay. If you have any questions about your discharge medications or the care you received while you were in the hospital after you are discharged, you can call the unit and asked to speak with the  hospitalist on call if the hospitalist that took care of you is not available. Once you are discharged, your primary care physician will handle any further medical issues. Please note that NO REFILLS for any discharge medications will be authorized once you are discharged,  as it is imperative that you return to your primary care physician (or establish a relationship with a primary care physician if you do not have one) for your aftercare needs so that they can reassess your need for medications and monitor your lab values.  Discharge Instructions   Ambulate patient    Complete by:  As directed   OK to resume PT as outpatient     Diet - low sodium heart healthy    Complete by:  As directed      Increase activity slowly    Complete by:  As directed             Medication List         ADVAIR DISKUS 100-50 MCG/DOSE Aepb  Generic drug:  Fluticasone-Salmeterol  Inhale 1 puff into the lungs 2 (two) times daily.     amiodarone 200 MG tablet  Commonly known as:  PACERONE  Take 200 mg by mouth every evening.     ascorbic acid 1000 MG tablet  Commonly known as:  VITAMIN C  Take 1,000 mg by mouth at bedtime.     celecoxib 200 MG capsule  Commonly known as:  CELEBREX  Take 200 mg by mouth 2 (two) times daily.     CRANBERRY PO  Take 2 tablets by mouth at bedtime.     dabigatran 150 MG Caps capsule  Commonly known as:  PRADAXA  Take 150 mg by mouth 2 (two) times daily.     dimenhyDRINATE 50 MG tablet  Commonly known as:  DRAMAMINE  Take 50 mg by mouth at bedtime as needed (sleep).     diphenhydrAMINE 25 mg capsule  Commonly known as:  BENADRYL  Take 50 mg by mouth at bedtime as needed for itching or sleep.     HYDROcodone-acetaminophen 7.5-325 MG per tablet  Commonly known as:  NORCO  Take 1-2 tablets by mouth every 4 (four) hours as needed for pain.     hydroxypropyl methylcellulose 2.5 % ophthalmic solution  Commonly known as:  ISOPTO TEARS  Place 1 drop into both eyes daily as needed (For dry eyes or allergies.). As needed     levofloxacin 750 MG tablet  Commonly known as:  LEVAQUIN  Take 1 tablet (750 mg total) by mouth daily.     methocarbamol 500 MG tablet  Commonly known as:  ROBAXIN  Take 1 tablet (500 mg total) by mouth every 6  (six) hours as needed (muscle spasms).     MYRBETRIQ 50 MG Tb24 tablet  Generic drug:  mirabegron ER  Take 50 mg by mouth daily.     omeprazole 20 MG capsule  Commonly known as:  PRILOSEC  Take 20 mg by mouth at bedtime.     predniSONE 10 MG tablet  Commonly known as:  STERAPRED UNI-PAK  Take 6-5-4-3-2-1 tablet PO daily till gone     pregabalin 50 MG capsule  Commonly known as:  LYRICA  Take 50 mg by mouth 2 (two) times daily.     PROAIR HFA IN  Inhale 2 puffs into the lungs every 6 (six) hours as needed (For shortness of breath.).     pyridoxine 100 MG tablet  Commonly known as:  B-6  Take 100 mg by mouth every evening.     sodium chloride 0.65 % Soln nasal spray  Commonly known as:  OCEAN  Place 2 sprays into both nostrils as needed for congestion.     traMADol 50 MG tablet  Commonly known as:  ULTRAM  Take 50 mg by mouth at bedtime as needed.     traZODone 50 MG tablet  Commonly known as:  DESYREL  Take 50 mg by mouth at bedtime.       Allergies  Allergen Reactions  . Sulfa Antibiotics Anaphylaxis and Shortness Of Breath  . Adhesive [Tape] Other (See Comments)    Satiny and adhesive tape - blisters skin  . Avelox [Moxifloxacin Hcl In Nacl] Diarrhea and Nausea And Vomiting  . Penicillins Hives and Rash       Follow-up Information   Follow up with [name] Rile, MD In 1 week.   Specialty:  Internal Medicine   Contact information:   [place] [address] [phone]        The results of significant diagnostics from this hospitalization (including imaging, microbiology, ancillary and laboratory) are listed below for reference.    Significant Diagnostic Studies: Dg Chest Port 1 View  07/03/2013   CLINICAL DATA:  Shortness of breath. Chest tightness in cough. COPD.  EXAM: PORTABLE CHEST - 1 VIEW  COMPARISON:  08/28/2012  FINDINGS: Emphysema noted. Cardiac and mediastinal margins appear normal. The lungs appear otherwise clear.  Lower  cervical spondylosis. Degenerative left glenohumeral arthropathy.  IMPRESSION: 1. Emphysema.   Electronically Signed   By: Sherryl Barters M.D.   On: 07/03/2013 20:10    Microbiology: No results found for this or any previous visit (from the past 240 hour(s)).   Labs: Basic Metabolic Panel:  Recent Labs Lab 07/03/13 1731 07/04/13 0440 07/05/13 0424 07/06/13 0425  NA 139 139 136* 138  K 4.1 3.8 5.2 4.8  CL 100 100 99 100  CO2 25 24 26 29   GLUCOSE 94 182* 123* 97  BUN 13 20 26* 27*  CREATININE 0.77 0.78 1.04 1.21*  CALCIUM 9.0 9.1 9.1 8.8   Liver Function Tests:  Recent Labs Lab 07/05/13 0424  AST 15  ALT 12  ALKPHOS 69  BILITOT 0.2*  PROT 5.9*  ALBUMIN 3.3*   No results found for this basename: LIPASE, AMYLASE,  in the last 168 hours No results found for this basename: AMMONIA,  in the last 168 hours CBC:  Recent Labs Lab 07/03/13 1731 07/04/13 0440 07/05/13 0424  WBC 4.3 3.4* 12.0*  NEUTROABS 2.5  --   --   HGB 11.4* 10.9* 10.2*  HCT 32.8* 31.8* 31.1*  MCV 104.1* 105.0* 106.5*  PLT 253 234 209   Cardiac Enzymes: No results found for this basename: CKTOTAL, CKMB, CKMBINDEX, TROPONINI,  in the last 168 hours BNP: BNP (last 3 results)  Recent Labs  07/03/13 1739  PROBNP 353.2*   CBG: No results found for this basename: GLUCAP,  in the last 168 hours     Signed:  Verlee Monte  [facility] 07/06/2013, 12:30 PM

## 2013-07-06 NOTE — Progress Notes (Signed)
OT Eval - Addendum    September 26, 2012 1750  OT Visit Information  Last OT Received On 26-Sep-2012  OT G-codes **NOT FOR INPATIENT CLASS**  Functional Assessment Tool Used clinical judgement  Functional Limitation Self care  Self Care Current Status (S4383) CI  Self Care Goal Status (J7939) CI  [facility], OTR/L  [phone] 09/26/2012

## 2013-07-06 NOTE — Progress Notes (Signed)
DC instructions reviewed with patient. Home med rec carefully reviewed and patient instructed when to take next dose of medications. Small spot to right stump was bleeding on admit and tegaderm was applied. Patient sent home with several tegaderms if needed and instructed to make an appointment with orthopedics ASAP. Patient scheduled to see ortho this afternoon at 1400. IV DC'ed. Patient DC home with all belongings, including right prosthetic. Dr. Delon Sacramento for patient to resume PT as outpatient. Patient to make f/u with PCP in 1 week. Patient DC to home. RN escorted patient to car. No changes noted since am assessment.

## 2013-07-06 NOTE — Progress Notes (Signed)
09/13/12 1433  PT Time Calculation  PT Start Time 1345  PT Stop Time 1425  PT Time Calculation (min) 40 min  PT G-Codes **NOT FOR INPATIENT CLASS**  Functional Assessment Tool Used clinical judgement  Functional Limitation Mobility: Walking and moving around  Mobility: Walking and Moving Around Current Status (O1771) CI  Mobility: Walking and Moving Around Goal Status (H6579) CH  PT General Charges  $$ ACUTE PT VISIT 1 Procedure  PT Evaluation  $Initial PT Evaluation Tier I 1 Procedure  PT Treatments  $Therapeutic Activity 38-52 mins  [place] PT [phone]

## 2013-07-09 ENCOUNTER — Ambulatory Visit: Payer: PRIVATE HEALTH INSURANCE | Admitting: Physical Therapy

## 2013-07-09 ENCOUNTER — Encounter (HOSPITAL_BASED_OUTPATIENT_CLINIC_OR_DEPARTMENT_OTHER): Payer: PRIVATE HEALTH INSURANCE | Attending: Plastic Surgery

## 2013-07-09 ENCOUNTER — Other Ambulatory Visit: Payer: Self-pay | Admitting: Plastic Surgery

## 2013-07-09 DIAGNOSIS — I4891 Unspecified atrial fibrillation: Secondary | ICD-10-CM | POA: Insufficient documentation

## 2013-07-09 DIAGNOSIS — J449 Chronic obstructive pulmonary disease, unspecified: Secondary | ICD-10-CM | POA: Insufficient documentation

## 2013-07-09 DIAGNOSIS — Z87891 Personal history of nicotine dependence: Secondary | ICD-10-CM | POA: Diagnosis not present

## 2013-07-09 DIAGNOSIS — L97809 Non-pressure chronic ulcer of other part of unspecified lower leg with unspecified severity: Secondary | ICD-10-CM | POA: Diagnosis present

## 2013-07-09 DIAGNOSIS — S88119A Complete traumatic amputation at level between knee and ankle, unspecified lower leg, initial encounter: Secondary | ICD-10-CM | POA: Insufficient documentation

## 2013-07-09 DIAGNOSIS — J4489 Other specified chronic obstructive pulmonary disease: Secondary | ICD-10-CM | POA: Insufficient documentation

## 2013-07-10 ENCOUNTER — Ambulatory Visit (HOSPITAL_COMMUNITY)
Admission: RE | Admit: 2013-07-10 | Discharge: 2013-07-10 | Disposition: A | Discharge status: Home / Self Care | Payer: PRIVATE HEALTH INSURANCE | Source: Ambulatory Visit | Attending: Cardiology | Admitting: Cardiology

## 2013-07-10 ENCOUNTER — Ambulatory Visit (INDEPENDENT_AMBULATORY_CARE_PROVIDER_SITE_OTHER): Payer: PRIVATE HEALTH INSURANCE | Admitting: Physician Assistant

## 2013-07-10 ENCOUNTER — Encounter: Payer: Self-pay | Admitting: Physician Assistant

## 2013-07-10 VITALS — BP 100/50 | HR 59 | Ht 71.0 in | Wt 165.0 lb

## 2013-07-10 DIAGNOSIS — M79609 Pain in unspecified limb: Secondary | ICD-10-CM | POA: Insufficient documentation

## 2013-07-10 DIAGNOSIS — R52 Pain, unspecified: Secondary | ICD-10-CM

## 2013-07-10 DIAGNOSIS — I4891 Unspecified atrial fibrillation: Secondary | ICD-10-CM | POA: Diagnosis not present

## 2013-07-10 DIAGNOSIS — I739 Peripheral vascular disease, unspecified: Secondary | ICD-10-CM | POA: Diagnosis not present

## 2013-07-10 DIAGNOSIS — F17200 Nicotine dependence, unspecified, uncomplicated: Secondary | ICD-10-CM

## 2013-07-10 DIAGNOSIS — Z720 Tobacco use: Secondary | ICD-10-CM | POA: Insufficient documentation

## 2013-07-10 NOTE — Progress Notes (Signed)
[facility]  NAMECORALEE, EDBERG          ACCOUNT NO.:  [id number]  MEDICAL RECORD NO.:  [id number]      DATE OF BIRTH:  Oct 28, 1945  PHYSICIAN:  Irene Limbo, MD    VISIT DATE:  07/09/2013                                  OFFICE VISIT   Ms. Hofbauer is a 68 year old female with history of right below-knee amputation.  She returns for followup of left lower extremity ulcerations.  Her last wound care has been [facility].  She is accompanied by her friend that does her wound care.  The patient has stopped smoking for approximately the last 1 month's time.  Since her last visit here, she was admitted to the hospital for COPD exacerbation as well as atrial fibrillation with RVR.  She also reports she underwent endovascular procedure at [facility] with Dr. Andree Elk approximately 2 months ago, we have no records of this visit.  PHYSICAL EXAMINATION:  Blood pressure is 106/65, pulse is 60, temperature is 97.8.  She has now healed her left lateral and left anterior distal wound.  She has one remaining wound, labeled as left anterior lower extremity.  Wound size is measured at 0.3 x 0.3 x 0.1 cm. This patient is largely anesthetic in the area.  After application of topical anesthetic, curette was used to remove all the superficial slough over the entirety of wound for selective debridement.  We will institute collagen dressing to be changed 3 times weekly and she will follow up in 2 week's time.          ______________________________ Irene Limbo, MD     BT/MEDQ  D:  07/09/2013  T:  07/10/2013  Job:  [id number]

## 2013-07-10 NOTE — Progress Notes (Signed)
Date:  07/10/2013   ID:  [name], DOB Dec 26, 1945, MRN [id number]  PCP:  Gilford Rile, MD  Primary Cardiologist:  Gwenlyn Found     History of Present Illness: [name] is a 68 y.o. thin appearing widowed Caucasian female with no children who was referred by Dr. Migdalia Dk for peripheral vascular evaluation. She has a history of 15 pack years of tobacco abuse continuing to smoke. There is no family history. She is not diabetic nor does she have hypertension or hyperlipidemia. She does have paroxysmal atrial fibrillation has had ablation in the past by Dr. Elonda Husky and is on Pentasa. 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 She injured her left pretibial area back in October and this has been difficult to heal since that time. She has received 6 months of hyperbaric therapy and aggressive local wound care. Venous Doppler showed reflux in the left greater saphenous vein which was too small for endovenous and probably noncontributory. Arterial Dopplers revealed an occluded anterior tibial and posterior tibial artery with a patent peroneal artery.  Patient presents today for followup to her peripheral vascular procedure which was completed by Dr. Andree Elk in [place] on May 4. Reports that the lower extremity wound on her left leg has healed considerably since then. Reports getting a new prosthesis for right leg Z. her to get up and start using it. She was hospitalized at the end of May with acute COPD exacerbation and atrophic with RVR. Takes amiodarone 200 mg daily and spontaneously converted.  She does report she said probably too short runs of A. fib since at home.  She takes pradaxa.  She is followed by Dr. Elonda Husky her primary cardiologist for this matter.  The patient currently denies nausea, vomiting, fever, chest pain, shortness of breath, orthopnea, dizziness, PND, cough, congestion, abdominal pain, hematochezia, melena, lower extremity  edema, claudication.  Wt Readings from Last 3 Encounters:  07/10/13 165 lb (74.844 kg)  07/06/13 177 lb 11.1 oz (80.6 kg)  05/16/13 165 lb (74.844 kg)     Past Medical History  Diagnosis Date  . Asthma   . GERD (gastroesophageal reflux disease)   . COPD (chronic obstructive pulmonary disease) with emphysema   . History of TIA (transient ischemic attack)     PER MRI 2009-- NO RESIDUAL  . Osteoarthritis   . Rheumatoid arthritis(714.0)   . DJD (degenerative joint disease)   . B12 deficiency anemia   . History of DVT of lower extremity     RIGHT LOWER LEG THEN MOVED TO GOIN S/P REMOVAL  . S/P AKA (above knee amputation) unilateral     RIGHT 12-17-2010  . Ulcer of left lower leg   . PAF (paroxysmal atrial fibrillation)   . S/P ablation of atrial fibrillation   . First degree heart block   . History of CHF (congestive heart failure)   . PONV (postoperative nausea and vomiting)   . Chronic venous insufficiency   . Venous insufficiency   . Peripheral arterial disease     nonhealing left foot ulcer    Current Outpatient Prescriptions  Medication Sig Dispense Refill  . Albuterol Sulfate (PROAIR HFA IN) Inhale 2 puffs into the lungs every 6 (six) hours as needed (For shortness of breath.).       Marland Kitchen amiodarone (PACERONE) 200 MG tablet Take 200 mg by mouth every evening.       Marland Kitchen ascorbic acid (VITAMIN C) 1000 MG tablet Take  1,000 mg by mouth at bedtime.      . celecoxib (CELEBREX) 200 MG capsule Take 200 mg by mouth 2 (two) times daily.       . clopidogrel (PLAVIX) 75 MG tablet       . CRANBERRY PO Take 2 tablets by mouth at bedtime.      . dabigatran (PRADAXA) 150 MG CAPS Take 150 mg by mouth 2 (two) times daily.       Marland Kitchen dimenhyDRINATE (DRAMAMINE) 50 MG tablet Take 50 mg by mouth at bedtime as needed (sleep).      . diphenhydrAMINE (BENADRYL) 25 mg capsule Take 50 mg by mouth at bedtime as needed for itching or sleep.      Marland Kitchen Fluticasone-Salmeterol (ADVAIR DISKUS) 100-50 MCG/DOSE AEPB  Inhale 1 puff into the lungs 2 (two) times daily.      Marland Kitchen HYDROcodone-acetaminophen (NORCO) 7.5-325 MG per tablet Take 1-2 tablets by mouth every 4 (four) hours as needed for pain.  80 tablet  0  . hydroxypropyl methylcellulose (ISOPTO TEARS) 2.5 % ophthalmic solution Place 1 drop into both eyes daily as needed (For dry eyes or allergies.). As needed      . levofloxacin (LEVAQUIN) 750 MG tablet Take 1 tablet (750 mg total) by mouth daily.  5 tablet  0  . methocarbamol (ROBAXIN) 500 MG tablet Take 1 tablet (500 mg total) by mouth every 6 (six) hours as needed (muscle spasms).  50 tablet  0  . MYRBETRIQ 50 MG TB24 tablet Take 50 mg by mouth daily.       Marland Kitchen omeprazole (PRILOSEC) 20 MG capsule Take 20 mg by mouth at bedtime.      . predniSONE (STERAPRED UNI-PAK) 10 MG tablet Take 6-5-4-3-2-1 tablet PO daily till gone  21 tablet  0  . pregabalin (LYRICA) 50 MG capsule Take 50 mg by mouth 2 (two) times daily.       Marland Kitchen pyridoxine (B-6) 100 MG tablet Take 100 mg by mouth every evening.       . sodium chloride (OCEAN) 0.65 % SOLN nasal spray Place 2 sprays into both nostrils as needed for congestion.      . traMADol (ULTRAM) 50 MG tablet Take 50 mg by mouth at bedtime as needed.       . traZODone (DESYREL) 50 MG tablet Take 50 mg by mouth at bedtime.       No current facility-administered medications for this visit.    Allergies:    Allergies  Allergen Reactions  . Sulfa Antibiotics Anaphylaxis and Shortness Of Breath  . Adhesive [Tape] Other (See Comments)    Satiny and adhesive tape - blisters skin  . Avelox [Moxifloxacin Hcl In Nacl] Diarrhea and Nausea And Vomiting  . Penicillins Hives and Rash    Social History:  The patient  reports that she quit smoking about 3 years ago. She has never used smokeless tobacco. She reports that she does not drink alcohol or use illicit drugs.   Family history:   Family History  Problem Relation Age of Onset  . Achondroplasia Father   . Congenital heart  disease Mother   . Hypertension Mother   . Hypertension Sister     ROS:  Please see the history of present illness.  All other systems reviewed and negative.   PHYSICAL EXAM: VS:  BP 100/50  Pulse 59  Ht 5\' 11"  (1.803 m)  Wt 165 lb (74.844 kg)  BMI 23.02 kg/m2  SpO2 95% Obese, well developed,  in no acute distress HEENT: Pupils are equal round react to light accommodation extraocular movements are intact.  Cardiac: Regular rate and rhythm with 1/6 sys MM Lungs:  clear to auscultation bilaterally, though breath sounds are decreased throughout, no wheezing, rhonchi or rales Ext: no left lower extremity edema.  2+ radial and 1+ left dorsalis pedis pulses. Skin: warm and dry Neuro:  Grossly normal  EKG:   Bradycardia rate 59 beats per minute  ASSESSMENT AND PLAN:  Problem List Items Addressed This Visit   Atrial fibrillation with RVR (Chronic)     Patient was recently hospitalized at the end of May for atrial fib with RVR and acute COPD exacerbation. She is on amiodarone and converts spontaneously. She states she's had at least a couple episodes short runs of A. fib since being discharged. She will follow up with Dr. Elonda Husky her primary cardiologist.    Peripheral arterial disease     Patient was seen in [facility] by Dr. Andree Elk on May 4 for peripheral vascular procedure. Patient reports she's doing much better in that respect in her lower extremity wound has healed up considerably. We'll obtain lower extremity arterial Dopplers on the left. She does report a small to moderate hematoma in the right groin which she will monitor for improvement .    Tobacco abuse     We discussed tobacco cessation.     Other Visit Diagnoses   Pain    -  Primary    Relevant Orders       EKG 12-Lead       Lower Extremity Arterial Doppler Left

## 2013-07-10 NOTE — Patient Instructions (Signed)
1.  left lower extremity atrial Dopplers today. 2.  followup with Dr. Elonda Husky regarding atrial fibrillation

## 2013-07-10 NOTE — Progress Notes (Signed)
Left Lower Extremity Arterial Duplex Completed. °Brianna L Mazza,RVT °

## 2013-07-10 NOTE — Assessment & Plan Note (Signed)
We discussed tobacco cessation.

## 2013-07-10 NOTE — Assessment & Plan Note (Signed)
Patient was seen in [facility] by Dr. Andree Elk on May 4 for peripheral vascular procedure. Patient reports she's doing much better in that respect in her lower extremity wound has healed up considerably. We'll obtain lower extremity arterial Dopplers on the left. She does report a small to moderate hematoma in the right groin which she will monitor for improvement .

## 2013-07-10 NOTE — Assessment & Plan Note (Signed)
Patient was recently hospitalized at the end of May for atrial fib with RVR and acute COPD exacerbation. She is on amiodarone and converts spontaneously. She states she's had at least a couple episodes short runs of A. fib since being discharged. She will follow up with Dr. Elonda Husky her primary cardiologist.

## 2013-07-11 ENCOUNTER — Other Ambulatory Visit: Payer: Self-pay | Admitting: Orthopedic Surgery

## 2013-07-11 DIAGNOSIS — M542 Cervicalgia: Secondary | ICD-10-CM

## 2013-07-12 ENCOUNTER — Ambulatory Visit
Admission: RE | Admit: 2013-07-12 | Discharge: 2013-07-12 | Disposition: A | Discharge status: Home / Self Care | Payer: Medicare Other | Source: Ambulatory Visit | Attending: Orthopedic Surgery | Admitting: Orthopedic Surgery

## 2013-07-12 ENCOUNTER — Ambulatory Visit: Payer: PRIVATE HEALTH INSURANCE | Admitting: Physical Therapy

## 2013-07-12 DIAGNOSIS — M542 Cervicalgia: Secondary | ICD-10-CM

## 2013-07-16 ENCOUNTER — Ambulatory Visit: Payer: PRIVATE HEALTH INSURANCE | Admitting: Physical Therapy

## 2013-07-17 ENCOUNTER — Ambulatory Visit: Payer: PRIVATE HEALTH INSURANCE | Admitting: Cardiovascular Disease

## 2013-07-19 ENCOUNTER — Encounter: Payer: Self-pay | Admitting: *Deleted

## 2013-07-19 ENCOUNTER — Ambulatory Visit: Payer: PRIVATE HEALTH INSURANCE | Admitting: Physical Therapy

## 2013-07-23 ENCOUNTER — Encounter: Payer: Self-pay | Admitting: Physical Therapy

## 2013-07-23 DIAGNOSIS — L97809 Non-pressure chronic ulcer of other part of unspecified lower leg with unspecified severity: Secondary | ICD-10-CM | POA: Diagnosis not present

## 2013-07-24 ENCOUNTER — Ambulatory Visit: Payer: PRIVATE HEALTH INSURANCE | Attending: Orthopedic Surgery | Admitting: Physical Therapy

## 2013-07-24 DIAGNOSIS — IMO0001 Reserved for inherently not codable concepts without codable children: Secondary | ICD-10-CM | POA: Diagnosis present

## 2013-07-24 DIAGNOSIS — R269 Unspecified abnormalities of gait and mobility: Secondary | ICD-10-CM | POA: Insufficient documentation

## 2013-07-24 DIAGNOSIS — S88119A Complete traumatic amputation at level between knee and ankle, unspecified lower leg, initial encounter: Secondary | ICD-10-CM | POA: Insufficient documentation

## 2013-07-24 DIAGNOSIS — R5381 Other malaise: Secondary | ICD-10-CM | POA: Diagnosis not present

## 2013-07-24 DIAGNOSIS — M6281 Muscle weakness (generalized): Secondary | ICD-10-CM | POA: Insufficient documentation

## 2013-07-26 ENCOUNTER — Ambulatory Visit: Payer: PRIVATE HEALTH INSURANCE | Admitting: Physical Therapy

## 2013-07-26 DIAGNOSIS — IMO0001 Reserved for inherently not codable concepts without codable children: Secondary | ICD-10-CM | POA: Diagnosis not present

## 2013-07-30 ENCOUNTER — Encounter: Payer: Self-pay | Admitting: Physical Therapy

## 2013-07-30 DIAGNOSIS — L97809 Non-pressure chronic ulcer of other part of unspecified lower leg with unspecified severity: Secondary | ICD-10-CM | POA: Diagnosis not present

## 2013-07-31 ENCOUNTER — Ambulatory Visit: Payer: PRIVATE HEALTH INSURANCE | Admitting: Physical Therapy

## 2013-07-31 DIAGNOSIS — IMO0001 Reserved for inherently not codable concepts without codable children: Secondary | ICD-10-CM | POA: Diagnosis not present

## 2013-07-31 NOTE — Progress Notes (Signed)
[facility]  NAMEJOZY, MCPHEARSON          ACCOUNT NO.:  [id number]  MEDICAL RECORD NO.:  [id number]      DATE OF BIRTH:  1945/05/06  PHYSICIAN:  Irene Limbo, MD    VISIT DATE:  07/30/2013                                  OFFICE VISIT   CHIEF COMPLAINT:  Followup of left lower extremity ulceration.  HISTORY OF PRESENT ILLNESS:  The patient is a 68 year old female with known peripheral vascular disease and history of right lower extremity amputation and here for followup of chronic wound of the left lower extremity.  At her last visit, the patient was started on collagen treatment.  She reported that she had undergone an endovascular procedure at [facility] with Dr. Andree Elk in May 2015.  I do not have any records of that intervention.  She reports that she was referred there by Dr. Gwenlyn Found.  She had repeat lower extremity arterial examination dated July 10, 2013.  This noted to have an ABI of 1.2, with small vessel disease with 1 vessel runoff via the peroneal artery.  The posterior and anterior tibial arteries appeared completely occluded.  I discussed these findings with the patient, the report suggests this changes.  There are no changes compared to previous exam on January 12, 2013.  The patient reports that she was told by Dr. Andree Elk that she had 3 vessel occlusion.  We discussed that she likely occluded her 3rd vessel in the interim between her last arterial exam at the time of her evaluation by Dr. Andree Elk.  Overall the patient has significant challenges to healing this wound given this.  After application topical anesthetic, curette was used to remove all the superficial slough for selective debridement over entirety of wound.  We will continue with collagen and the patient will follow up in 2 week's time.          ______________________________ Irene Limbo, MD     BT/MEDQ  D:  07/30/2013  T:  07/31/2013  Job:  [id number]

## 2013-08-02 ENCOUNTER — Ambulatory Visit: Payer: PRIVATE HEALTH INSURANCE | Admitting: Physical Therapy

## 2013-08-13 ENCOUNTER — Encounter (HOSPITAL_BASED_OUTPATIENT_CLINIC_OR_DEPARTMENT_OTHER): Payer: Medicare Other | Attending: Plastic Surgery

## 2013-08-14 ENCOUNTER — Ambulatory Visit: Payer: PRIVATE HEALTH INSURANCE | Admitting: Physical Therapy

## 2013-08-21 ENCOUNTER — Encounter: Payer: Self-pay | Admitting: Physical Therapy

## 2013-08-21 ENCOUNTER — Inpatient Hospital Stay: Payer: Medicare Other | Admitting: Critical Care Medicine

## 2013-08-23 ENCOUNTER — Encounter: Payer: Self-pay | Admitting: Physical Therapy

## 2013-08-28 ENCOUNTER — Encounter: Payer: Self-pay | Admitting: Physical Therapy

## 2013-08-29 ENCOUNTER — Ambulatory Visit: Payer: PRIVATE HEALTH INSURANCE | Admitting: Cardiovascular Disease

## 2013-08-30 ENCOUNTER — Encounter: Payer: Self-pay | Admitting: Physical Therapy

## 2013-09-04 ENCOUNTER — Encounter: Payer: Self-pay | Admitting: Physical Therapy

## 2013-09-06 ENCOUNTER — Encounter: Payer: Self-pay | Admitting: Physical Therapy

## 2013-09-11 ENCOUNTER — Institutional Professional Consult (permissible substitution): Payer: Self-pay | Admitting: Critical Care Medicine

## 2013-09-12 ENCOUNTER — Encounter: Payer: Self-pay | Admitting: Internal Medicine

## 2013-09-12 ENCOUNTER — Ambulatory Visit (INDEPENDENT_AMBULATORY_CARE_PROVIDER_SITE_OTHER): Payer: Medicare Other | Admitting: Internal Medicine

## 2013-09-12 VITALS — BP 132/66 | HR 62 | Temp 97.9°F

## 2013-09-12 DIAGNOSIS — J438 Other emphysema: Secondary | ICD-10-CM

## 2013-09-12 DIAGNOSIS — R918 Other nonspecific abnormal finding of lung field: Secondary | ICD-10-CM | POA: Insufficient documentation

## 2013-09-12 DIAGNOSIS — R0902 Hypoxemia: Secondary | ICD-10-CM

## 2013-09-12 DIAGNOSIS — J9611 Chronic respiratory failure with hypoxia: Secondary | ICD-10-CM | POA: Insufficient documentation

## 2013-09-12 DIAGNOSIS — R222 Localized swelling, mass and lump, trunk: Secondary | ICD-10-CM

## 2013-09-12 DIAGNOSIS — J432 Centrilobular emphysema: Secondary | ICD-10-CM

## 2013-09-12 DIAGNOSIS — J9610 Chronic respiratory failure, unspecified whether with hypoxia or hypercapnia: Secondary | ICD-10-CM

## 2013-09-12 MED ORDER — PANTOPRAZOLE SODIUM 40 MG PO TBEC: 40.0000 mg | DELAYED_RELEASE_TABLET | Freq: Every day | ORAL | Status: AC

## 2013-09-12 NOTE — Patient Instructions (Addendum)
Pantoprazole (protonix) 40 mg (= 2 prilosec)   Take 30-60 min before first meal of the day and Pepcid 20 mg one bedtime  GERD (REFLUX)  is an extremely common cause of respiratory symptoms, many times with no significant heartburn at all.    It can be treated with medication, but also with lifestyle changes including avoidance of late meals, excessive alcohol, smoking cessation, and avoid fatty foods, chocolate, peppermint, colas, red wine, and acidic juices such as orange juice.  NO MINT OR MENTHOL PRODUCTS SO NO COUGH DROPS  USE SUGARLESS CANDY INSTEAD (jolley ranchers or Stover's)  NO OIL BASED VITAMINS - use powdered substitutes.  Stop advair  Use nebulizer as needed up to 4 x daily   Please see patient coordinator before you leave today  to schedule IR Bx of lymph node

## 2013-09-12 NOTE — Assessment & Plan Note (Signed)
-   Pet 09/11/13 c/w Stage IV lung ca with L hilar mass and LL obst   Discussed in detail all the  indications, usual  risks and alternatives  relative to the benefits with patient who agrees to proceed with IR bx off pridaxa x 3 days with slt increase risk CVA from CAF acknowledged/accepted by pt  Other option is fob but this is risky based on tenuous resp status/ upper airway instability

## 2013-09-12 NOTE — Assessment & Plan Note (Addendum)
DDX of  difficult airways management all start with A and  include Adherence, Ace Inhibitors, Acid Reflux, Active Sinus Disease, Alpha 1 Antitripsin deficiency, Anxiety masquerading as Airways dz,  ABPA,  allergy(esp in young), Aspiration (esp in elderly), Adverse effects of DPI,  Active smokers, plus two Bs  = Bronchiectasis and Beta blocker use..and one C= CHF  Adherence is always the initial "prime suspect" and is a multilayered concern that requires a "trust but verify" approach in every patient - starting with knowing how to use medications, especially inhalers, correctly, keeping up with refills and understanding the fundamental difference between maintenance and prns vs those medications only taken for a very short course and then stopped and not refilled.   ? Adverse effects of advair, esp given all her upper airway complaints > try off and just use neb   ? Acid (or non-acid) GERD > always difficult to exclude as up to 75% of pts in some series report no assoc GI/ Heartburn symptoms> rec max (24h)  acid suppression and diet restrictions/ reviewed and instructions given in writing.   Even if proves the sorethroat and dysphagia are related to malignancy she would be better off on advair/ on max gerd   ? CHF > note caf and AR/ MR > f/u cards planned   See instructions for specific recommendations which were reviewed directly with the patient who was given a copy with highlighter outlining the key components.

## 2013-09-12 NOTE — Progress Notes (Signed)
   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 Pulmonary Consult    Referred per Dr. Sherolyn Buba. Pt c/o SOB on and off "for a long time"- worse for the past 2 month.  She states that she is SOB " all the time"- even with talking. She also c/o cough for the past wk- prod with green to brown sputum. She has had sore throat since Jan 2015 and states it has been getting progressively worse.   main complaint is sore throat and hoarseness with min dark sputum and dysphagia but still able to swallow s choking on food - breathing worse after advair, better p neb   No obvious day to day or daytime variabilty or assoc chronic cough or cp or chest tightness, subjective wheeze overt sinus or hb symptoms. No unusual exp hx or h/o childhood pna/ asthma or knowledge of premature birth.  Sleeping ok without nocturnal  or early am exacerbation  of respiratory  c/o's or need for noct saba. Also denies any obvious fluctuation of symptoms with weather or environmental changes or other aggravating or alleviating factors except as outlined above   Current Medications, Allergies, Complete Past Medical History, Past Surgical History, Family History, and Social History were reviewed in [employer] record.   .       Review of Systems  Constitutional: Negative for fever, chills and unexpected weight change.  HENT: Positive for congestion, sore  throat and trouble swallowing. Negative for dental problem, ear pain, nosebleeds, postnasal drip, rhinorrhea, sinus pressure, sneezing and voice change.   Eyes: Negative for visual disturbance.  Respiratory: Positive for cough and shortness of breath. Negative for choking.   Cardiovascular: Negative for chest pain and leg swelling.  Gastrointestinal: Negative for vomiting, abdominal pain and diarrhea.  Genitourinary: Negative for difficulty urinating.  Musculoskeletal: Positive for arthralgias.  Skin: Negative for rash.  Neurological: Negative for tremors, syncope and headaches.  Hematological: Does not bruise/bleed easily.       Objective:   Physical Exam  edentulous wf extremely hoarse  Wt Readings from Last 3 Encounters:  07/10/13 165 lb (74.844 kg)  07/06/13 177 lb 11.1 oz (80.6 kg)  05/16/13 165 lb (74.844 kg)       HEENT mild turbinate edema.  Oropharynx no thrush or excess pnd or cobblestoning.  No JVD or cervical adenopathy. Mild accessory muscle hypertrophy. Trachea midline, nl thryroid. Chest was hyperinflated by percussion with diminished breath sounds and moderate increased exp time without wheeze. Hoover sign positive at mid inspiration. Regular rate and rhythm without murmur gallop or rub or increase P2 or edema.  Abd: no hsm, nl excursion. Ext warm without cyanosis or clubbing.             Assessment & Plan:

## 2013-09-12 NOTE — Assessment & Plan Note (Signed)
Ok on RA at rest but should continue 02 with any activity and hs plus prn sob not controlled with neb

## 2013-09-13 ENCOUNTER — Other Ambulatory Visit: Payer: Self-pay | Admitting: Radiology

## 2013-09-14 ENCOUNTER — Encounter (HOSPITAL_COMMUNITY): Payer: Self-pay | Admitting: Pharmacy Technician

## 2013-09-14 ENCOUNTER — Telehealth: Payer: Self-pay | Admitting: Internal Medicine

## 2013-09-14 NOTE — Telephone Encounter (Signed)
Spoke with Richardson Landry from Radiology  States that an order was placed for pt to have U/S Biopsy of lung with Dx: Lung Mass Per Richardson Landry, they cannot do an U/S Biopsy with a Dx of Lung Mass.   Was this supposed to be a CT Biopsy? Different Diagnosis?  Richardson Landry requests a callback [phone] Pt's appt is 09/17/13 (Monday)  Please advise Dr Melvyn Novas. Thanks.

## 2013-09-14 NOTE — Telephone Encounter (Signed)
Calling to verify order

## 2013-09-17 ENCOUNTER — Ambulatory Visit (HOSPITAL_COMMUNITY)
Admission: RE | Admit: 2013-09-17 | Discharge: 2013-09-17 | Disposition: A | Discharge status: Home / Self Care | Payer: Medicare Other | Source: Ambulatory Visit | Attending: Internal Medicine | Admitting: Internal Medicine

## 2013-09-17 ENCOUNTER — Other Ambulatory Visit: Payer: Self-pay | Admitting: Internal Medicine

## 2013-09-17 ENCOUNTER — Encounter (HOSPITAL_COMMUNITY): Payer: Self-pay

## 2013-09-17 ENCOUNTER — Encounter: Payer: Self-pay | Admitting: Internal Medicine

## 2013-09-17 DIAGNOSIS — R599 Enlarged lymph nodes, unspecified: Secondary | ICD-10-CM | POA: Diagnosis present

## 2013-09-17 DIAGNOSIS — C969 Malignant neoplasm of lymphoid, hematopoietic and related tissue, unspecified: Secondary | ICD-10-CM | POA: Diagnosis not present

## 2013-09-17 DIAGNOSIS — R918 Other nonspecific abnormal finding of lung field: Secondary | ICD-10-CM

## 2013-09-17 DIAGNOSIS — R222 Localized swelling, mass and lump, trunk: Secondary | ICD-10-CM | POA: Diagnosis present

## 2013-09-17 LAB — CBC WITH DIFFERENTIAL/PLATELET
Basophils Absolute: 0 10*3/uL (ref 0.0–0.1)
Basophils Relative: 0 % (ref 0–1)
Eosinophils Absolute: 0 10*3/uL (ref 0.0–0.7)
Eosinophils Relative: 0 % (ref 0–5)
HEMATOCRIT: 31.4 % — AB (ref 36.0–46.0)
Hemoglobin: 10.4 g/dL — ABNORMAL LOW (ref 12.0–15.0)
LYMPHS PCT: 15 % (ref 12–46)
Lymphs Abs: 1.2 10*3/uL (ref 0.7–4.0)
MCH: 35 pg — ABNORMAL HIGH (ref 26.0–34.0)
MCHC: 33.1 g/dL (ref 30.0–36.0)
MCV: 105.7 fL — ABNORMAL HIGH (ref 78.0–100.0)
MONO ABS: 1.2 10*3/uL — AB (ref 0.1–1.0)
Monocytes Relative: 15 % — ABNORMAL HIGH (ref 3–12)
Neutro Abs: 5.8 10*3/uL (ref 1.7–7.7)
Neutrophils Relative %: 70 % (ref 43–77)
Platelets: 378 10*3/uL (ref 150–400)
RBC: 2.97 MIL/uL — AB (ref 3.87–5.11)
RDW: 14.3 % (ref 11.5–15.5)
WBC: 8.2 10*3/uL (ref 4.0–10.5)

## 2013-09-17 LAB — BASIC METABOLIC PANEL
Anion gap: 12 (ref 5–15)
BUN: 8 mg/dL (ref 6–23)
CO2: 25 meq/L (ref 19–32)
Calcium: 9.1 mg/dL (ref 8.4–10.5)
Chloride: 103 mEq/L (ref 96–112)
Creatinine, Ser: 0.59 mg/dL (ref 0.50–1.10)
GFR calc Af Amer: >90 mL/min (ref >90)
GFR calc non Af Amer: >90 mL/min (ref >90)
GLUCOSE: 88 mg/dL (ref 70–99)
POTASSIUM: 3.6 meq/L — AB (ref 3.7–5.3)
SODIUM: 140 meq/L (ref 137–147)

## 2013-09-17 LAB — PROTIME-INR
INR: 0.92 (ref 0.00–1.49)
PROTHROMBIN TIME: 12.4 s (ref 11.6–15.2)

## 2013-09-17 LAB — APTT: APTT: 26 s (ref 24–37)

## 2013-09-17 MED ORDER — MIDAZOLAM HCL 2 MG/2ML IJ SOLN
INTRAMUSCULAR | Status: AC
  Filled 2013-09-17: qty 2

## 2013-09-17 MED ORDER — SODIUM CHLORIDE 0.9 % IV SOLN
INTRAVENOUS | Status: DC
  Administered 2013-09-17: 11:00:00 via INTRAVENOUS

## 2013-09-17 MED ORDER — HYDROCODONE-ACETAMINOPHEN 5-325 MG PO TABS
1.0000 | ORAL_TABLET | Freq: Once | ORAL | Status: AC
  Administered 2013-09-17: 1 via ORAL
  Filled 2013-09-17 (×2): qty 1

## 2013-09-17 MED ORDER — FENTANYL CITRATE 0.05 MG/ML IJ SOLN
INTRAMUSCULAR | Status: AC
  Filled 2013-09-17: qty 2

## 2013-09-17 MED ORDER — FENTANYL CITRATE 0.05 MG/ML IJ SOLN
INTRAMUSCULAR | Status: AC | PRN
  Administered 2013-09-17: 50 ug via INTRAVENOUS

## 2013-09-17 MED ORDER — MIDAZOLAM HCL 2 MG/2ML IJ SOLN
INTRAMUSCULAR | Status: AC | PRN
  Administered 2013-09-17: 1 mg via INTRAVENOUS

## 2013-09-17 NOTE — Discharge Instructions (Signed)
Biopsy Care After Refer to this sheet in the next few weeks. These instructions provide you with information on caring for yourself after your procedure. Your caregiver may also give you more specific instructions. Your treatment has been planned according to current medical practices, but problems sometimes occur. Call your caregiver if you have any problems or questions after your procedure. If you had a fine needle biopsy, you may have soreness at the biopsy site for 1 to 2 days. If you had an open biopsy, you may have soreness at the biopsy site for 3 to 4 days. HOME CARE INSTRUCTIONS          You may resume normal diet and activities as directed               May shower in the morning and remove dressing       Only take over-the-counter or prescription medicines for pain, discomfort, or fever as directed by your caregiver.        Ask your caregiver when you can bathe and get your wound wet. SEEK IMMEDIATE MEDICAL CARE IF:   You have increased bleeding (more than a small spot) from the biopsy site.  You notice redness, swelling, or increasing pain at the biopsy site.  You have pus coming from the biopsy site.  You have a fever.  You notice a bad smell coming from the biopsy site or dressing.  You have a rash, have difficulty breathing, or have any allergic problems. MAKE SURE YOU:   Understand these instructions.  Will watch your condition.  Will get help right away if you are not doing well or get worse. Document Released: 08/14/2004 Document Revised: 04/19/2011 Document Reviewed: 07/23/2010 [facility] Patient Information 2015 [place], [place]. This information is not intended to replace advice given to you by your health care provider. Make sure you discuss any questions you have with your health care provider. Conscious Sedation, Adult, Care After Refer to this sheet in the next few weeks. These instructions provide you with information on caring for yourself after your procedure.  Your health care provider may also give you more specific instructions. Your treatment has been planned according to current medical practices, but problems sometimes occur. Call your health care provider if you have any problems or questions after your procedure. WHAT TO EXPECT AFTER THE PROCEDURE  After your procedure:  You may feel sleepy, clumsy, and have poor balance for several hours.  Vomiting may occur if you eat too soon after the procedure. HOME CARE INSTRUCTIONS  Do not participate in any activities where you could become injured for at least 24 hours. Do not:  Drive.  Swim.  Ride a bicycle.  Operate heavy machinery.  Cook.  Use power tools.  Climb ladders.  Work from a high place.  Do not make important decisions or sign legal documents until you are improved.  If you vomit, drink water, juice, or soup when you can drink without vomiting. Make sure you have little or no nausea before eating solid foods.  Only take over-the-counter or prescription medicines for pain, discomfort, or fever as directed by your health care provider.  Make sure you and your family fully understand everything about the medicines given to you, including what side effects may occur.  You should not drink alcohol, take sleeping pills, or take medicines that cause drowsiness for at least 24 hours.  If you smoke, do not smoke without supervision.  If you are feeling better, you may resume  normal activities 24 hours after you were sedated.  Keep all appointments with your health care provider. SEEK MEDICAL CARE IF:  Your skin is pale or bluish in color.  You continue to feel nauseous or vomit.  Your pain is getting worse and is not helped by medicine.  You have bleeding or swelling.  You are still sleepy or feeling clumsy after 24 hours. SEEK IMMEDIATE MEDICAL CARE IF:  You develop a rash.  You have difficulty breathing.  You develop any type of allergic problem.  You have  a fever. MAKE SURE YOU:  Understand these instructions.  Will watch your condition.  Will get help right away if you are not doing well or get worse. Document Released: 11/15/2012 Document Reviewed: 11/15/2012 [facility] Patient Information 2015 [place], [place]. This information is not intended to replace advice given to you by your health care provider. Make sure you discuss any questions you have with your health care provider.

## 2013-09-17 NOTE — H&P (Signed)
Agree.  For left cervical LN biopsy today.

## 2013-09-17 NOTE — Procedures (Signed)
Procedure:  Ultrasound guided core biopsy of left cervical lymph node Findings:  12 mm left cervical node corresponding to PET positive node. 18 G core biopsy x 5.  Submitted in saline.

## 2013-09-17 NOTE — H&P (Signed)
[name] is an 68 y.o. female.   Chief Complaint: "I'm having a biopsy" HPI: Patient with history of smoking and recent PET scan revealing hypermetabolic activity in left hilar and left cervical lymph node regions presents today for US guided left cervical lymph node biopsy.  Past Medical History  Diagnosis Date  . Asthma   . GERD (gastroesophageal reflux disease)   . COPD (chronic obstructive pulmonary disease) with emphysema   . History of TIA (transient ischemic attack)     PER MRI 2009-- NO RESIDUAL  . Osteoarthritis   . Rheumatoid arthritis(714.0)   . DJD (degenerative joint disease)   . B12 deficiency anemia   . History of DVT of lower extremity     RIGHT LOWER LEG THEN MOVED TO GOIN S/P REMOVAL  . S/P AKA (above knee amputation) unilateral     RIGHT 12-17-2010  . Ulcer of left lower leg   . PAF (paroxysmal atrial fibrillation)   . S/P ablation of atrial fibrillation   . First degree heart block   . History of CHF (congestive heart failure)   . PONV (postoperative nausea and vomiting)   . Chronic venous insufficiency   . Venous insufficiency   . Peripheral arterial disease     nonhealing left foot ulcer    Past Surgical History  Procedure Laterality Date  . Cardiac electrophysiology mapping and ablation  APRIL 2009    A-FIB ABLATION AND TEE ( SLIGHTLY DILATED ATRIA, NORMAL LV FUNCTION AND VALVES)  . Hematoma evacuation  2009    From right groin  . Trigger finger release  2011  . Amputation  12/17/2010    Procedure: AMPUTATION ABOVE KNEE;  Surgeon: Mauri Pole;  Location: [facility];  Service: Orthopedics;  Laterality: Right;  . Application of wound vac  12/17/2010    Procedure: APPLICATION OF WOUND VAC;  Surgeon: Mauri Pole;  Location: [facility];  Service: Orthopedics;  Laterality: Right;  wound Vac applied @ 2045 By Salli Quarry  . Amputation  04/27/2011    Procedure: AMPUTATION ABOVE KNEE;  Surgeon: Mauri Pole, MD;  Location: [facility];  Service: Orthopedics;   Laterality: Right;  Revision Right above knee amputation.  . Cardioversion  10-22-2011   [facility]    x5 times  . Amputation  02/28/2012    Procedure: AMPUTATION BELOW KNEE;  Surgeon: Mauri Pole, MD;  Location: [facility];  Service: Orthopedics;  Laterality: Right;  SCAR REVISION  . Tenosynovectomy  04-19-2012    left long and ring fingers   . Amputation Right 06/06/2012    Procedure: REVISION ABOVE RIGHT KNEE AMPUTATION, STUMP ;  Surgeon: Mauri Pole, MD;  Location: [facility];  Service: Orthopedics;  Laterality: Right;  . I&d extremity Right 06/27/2012    Procedure: IRRIGATION AND DEBRIDEMENT hematoma right AKA;  Surgeon: Mauri Pole, MD;  Location: [facility];  Service: Orthopedics;  Laterality: Right;  . Irrigation and debridement knee Right 09/12/2012    Procedure: REVISION RIGHT LEG WOUND AND PRIMARY CLOSURE ;  Surgeon: Mauri Pole, MD;  Location: [facility];  Service: Orthopedics;  Laterality: Right;  . Total knee arthroplasty Bilateral RIGHT 1996/  LEFT 1997  . Revision total knee arthroplasty Right 02-04-2010  . Cysto/ bladder bx  02-26-2003  . Resection right total knee / i & d/  placement antibiotic spacer  04-23-2010    06-23-2010--REPEAT I & D REPLACEMENT ANTIBIOTIC SPACER/   AND MULTIPLE TEETH EXTRACTIONS WITH BILATERAL MANDIBULAR REDUCTION  .  Removal antibiotic spacer/  reimplantation right total knee  09-07-2010  . Open repair quadriceps tendon w/ allograft Right 12-08-2010  . Cataract extraction w/ intraocular lens  implant, bilateral    . Cholecystectomy  1996  . Transthoracic echocardiogram  12-16-2010    LVEF  60-65%/  MILD DILATED LA  &  RA/  MODERATELY DILATED RV  . Vascular surgery      REPAIR CFA  . Cardiac catheterization  2006  DR ZAN TYSON    NO CAD (PER CARDIOLOGIST NOTE , DR TYSON)  . I&d extremity Left 02/26/2013    Procedure: IRRIGATION AND DEBRIDEMENT OF LEFT LEG WITH PLACEMENT OF ACELL;  Surgeon: [name], DO;  Location: [facility];   Service: Plastics;  Laterality: Left;    Family History  Problem Relation Age of Onset  . Achondroplasia Father   . Congenital heart disease Mother   . Hypertension Mother   . Hypertension Sister    Social History:  reports that she quit smoking about 3 years ago. She has never used smokeless tobacco. She reports that she does not drink alcohol or use illicit drugs.  Allergies:  Allergies  Allergen Reactions  . Sulfa Antibiotics Anaphylaxis and Shortness Of Breath  . Adhesive [Tape] Other (See Comments)    Satiny and adhesive tape - blisters skin  . Avelox [Moxifloxacin Hcl In Nacl] Diarrhea and Nausea And Vomiting  . Penicillins Hives and Rash    Current outpatient prescriptions:Albuterol Sulfate (PROAIR HFA IN), Inhale 2 puffs into the lungs every 6 (six) hours as needed (For shortness of breath.). , Disp: , Rfl: ;  amiodarone (PACERONE) 200 MG tablet, Take 200 mg by mouth daily with supper. , Disp: , Rfl: ;  ascorbic acid (VITAMIN C) 1000 MG tablet, Take 1,000 mg by mouth daily with supper. , Disp: , Rfl:  celecoxib (CELEBREX) 200 MG capsule, Take 200 mg by mouth 2 (two) times daily. , Disp: , Rfl: ;  clindamycin (CLEOCIN) 150 MG capsule, Take 300 mg by mouth 3 (three) times daily. She is to take for 10 days. She started on 09/13/13 and has not completed yet., Disp: , Rfl: ;  CRANBERRY PO, Take 1 tablet by mouth 2 (two) times daily with a meal. , Disp: , Rfl: ;  dabigatran (PRADAXA) 150 MG CAPS, Take 150 mg by mouth 2 (two) times daily. , Disp: , Rfl:  famotidine (PEPCID) 20 MG tablet, Take 20 mg by mouth at bedtime., Disp: , Rfl: ;  HYDROcodone-acetaminophen (NORCO) 7.5-325 MG per tablet, Take 1-2 tablets by mouth every 4 (four) hours as needed for pain., Disp: 80 tablet, Rfl: 0;  hydroxypropyl methylcellulose (ISOPTO TEARS) 2.5 % ophthalmic solution, Place 1 drop into both eyes daily as needed (For dry eyes or allergies.). As needed, Disp: , Rfl:  methocarbamol (ROBAXIN) 500 MG tablet,  Take 1 tablet (500 mg total) by mouth every 6 (six) hours as needed (muscle spasms)., Disp: 50 tablet, Rfl: 0;  MYRBETRIQ 50 MG TB24 tablet, Take 50 mg by mouth daily with lunch. , Disp: , Rfl: ;  pantoprazole (PROTONIX) 40 MG tablet, Take 1 tablet (40 mg total) by mouth daily before breakfast., Disp: 30 tablet, Rfl: 2;  pyridoxine (B-6) 100 MG tablet, Take 100 mg by mouth daily with supper. , Disp: , Rfl:  sodium chloride (OCEAN) 0.65 % SOLN nasal spray, Place 2 sprays into both nostrils daily as needed for congestion. , Disp: , Rfl: ;  traZODone (DESYREL) 50 MG tablet, Take  50 mg by mouth at bedtime., Disp: , Rfl:  Current facility-administered medications:0.9 %  sodium chloride infusion, , Intravenous, Continuous, D Rowe Robert, PA-C, Last Rate: 50 mL/hr at 09/17/13 1050   Results for orders placed during the hospital encounter of 09/17/13 (from the past 48 hour(s))  APTT     Status: None   Collection Time    09/17/13 10:45 AM      Result Value Ref Range   aPTT 26  24 - 37 seconds  BASIC METABOLIC PANEL     Status: Abnormal   Collection Time    09/17/13 10:45 AM      Result Value Ref Range   Sodium 140  137 - 147 mEq/L   Potassium 3.6 (*) 3.7 - 5.3 mEq/L   Chloride 103  96 - 112 mEq/L   CO2 25  19 - 32 mEq/L   Glucose, Bld 88  70 - 99 mg/dL   BUN 8  6 - 23 mg/dL   Creatinine, Ser 0.59  0.50 - 1.10 mg/dL   Calcium 9.1  8.4 - 10.5 mg/dL   GFR calc non Af Amer >90  >90 mL/min   GFR calc Af Amer >90  >90 mL/min   Comment: (NOTE)     The eGFR has been calculated using the CKD EPI equation.     This calculation has not been validated in all clinical situations.     eGFR's persistently <90 mL/min signify possible Chronic Kidney     Disease.   Anion gap 12  5 - 15  CBC WITH DIFFERENTIAL     Status: Abnormal   Collection Time    09/17/13 10:45 AM      Result Value Ref Range   WBC 8.2  4.0 - 10.5 K/uL   RBC 2.97 (*) 3.87 - 5.11 MIL/uL   Hemoglobin 10.4 (*) 12.0 - 15.0 g/dL   HCT 31.4  (*) 36.0 - 46.0 %   MCV 105.7 (*) 78.0 - 100.0 fL   MCH 35.0 (*) 26.0 - 34.0 pg   MCHC 33.1  30.0 - 36.0 g/dL   RDW 14.3  11.5 - 15.5 %   Platelets 378  150 - 400 K/uL   Neutrophils Relative % 70  43 - 77 %   Neutro Abs 5.8  1.7 - 7.7 K/uL   Lymphocytes Relative 15  12 - 46 %   Lymphs Abs 1.2  0.7 - 4.0 K/uL   Monocytes Relative 15 (*) 3 - 12 %   Monocytes Absolute 1.2 (*) 0.1 - 1.0 K/uL   Eosinophils Relative 0  0 - 5 %   Eosinophils Absolute 0.0  0.0 - 0.7 K/uL   Basophils Relative 0  0 - 1 %   Basophils Absolute 0.0  0.0 - 0.1 K/uL  PROTIME-INR     Status: None   Collection Time    09/17/13 10:45 AM      Result Value Ref Range   Prothrombin Time 12.4  11.6 - 15.2 seconds   INR 0.92  0.00 - 1.49   No results found.  Review of Systems  Constitutional: Negative for fever and chills.  HENT: Positive for sore throat.   Respiratory: Positive for cough and shortness of breath. Negative for hemoptysis.   Cardiovascular: Positive for claudication and leg swelling. Negative for chest pain.  Gastrointestinal: Negative for nausea, vomiting and abdominal pain.  Musculoskeletal: Positive for neck pain.  Neurological: Negative for headaches.    Blood pressure 111/51, pulse 60,  temperature 97.6 F (36.4 C), temperature source Oral, resp. rate 16, height 5' 11"  (1.803 m), weight 162 lb (73.483 kg), SpO2 100.00%. Physical Exam  Constitutional: She is oriented to person, place, and time. She appears well-developed and well-nourished.  Cardiovascular: Normal rate and regular rhythm.   Respiratory: Effort normal.  Few scatt exp wheezes  GI: Soft. Bowel sounds are normal. There is no tenderness.  Musculoskeletal: She exhibits edema.  Rt AKA  Lymphadenopathy:    She has cervical adenopathy.  Neurological: She is alert and oriented to person, place, and time.     Assessment/Plan Patient with history of smoking and recent PET scan revealing hypermetabolic activity in left hilar and left  cervical lymph node regions presents today for US guided left cervical lymph node biopsy. Details/risks of procedure d/w pt /family with their understanding and consent.  ALLRED,D KEVIN 09/17/2013, 12:31 PM

## 2013-09-18 ENCOUNTER — Telehealth: Payer: Self-pay | Admitting: Internal Medicine

## 2013-09-18 ENCOUNTER — Encounter: Payer: Self-pay | Admitting: Internal Medicine

## 2013-09-18 ENCOUNTER — Telehealth: Payer: Self-pay | Admitting: Cardiovascular Disease

## 2013-09-18 NOTE — Telephone Encounter (Signed)
Patient states that she had a vascular procedure on April 23rd by Dr. Brunetta Jeans in [place], [place].  Her left leg (her only leg) is no swollen from above her knee to the end of her toes.  The home health nurse stated that it was 3+ pitting edema.  Patient is very concerned---she does not want to lose her only leg.   Please call.

## 2013-09-18 NOTE — Telephone Encounter (Signed)
Called spoke with patient and discussed MW's recommendations with her.  Pt voiced her understanding.  Pt's [facility] nurse is at the home with patient and pt asked this be relayed to her as well.  Spoke with [facility] Helene Kelp (whom is filling in today for pt's regular HHN) and discussed the message/pt's complaints and MW's recommendations.  Helene Kelp verbalized her understanding.  Dr Kennon Holter phone number obtained for Helene Kelp and she will assist pt will calling and scheduling appt.  Nothing further needed; will sign off.

## 2013-09-18 NOTE — Telephone Encounter (Signed)
Chart review indicates Dr Gwenlyn Found is aware of this and already evaluated it so she should let him know it is worse than usual and f/u with him (may want to repeat the venous studies that were done in June if worse)

## 2013-09-18 NOTE — Telephone Encounter (Signed)
Called and spoke with pt and she stated that she had a biopsy done of a place on her neck yesterday.  She stated that her left leg is swollen about 2 times bigger than normal and this happens to be her only leg left.   She stated that the SOB has never gone away.  Pt is concerned about her leg and feels that she needs to be seen today but not sure who she should see for the pt.  MW please advise.  No openings on MW schedule today but offered appt with MW tomorrow and pt stated that she feels she should be seen today.  Please advise. thanks

## 2013-09-18 NOTE — Telephone Encounter (Signed)
I do not see Lasix on her med list.  However, since she is reporting 40 mg Lasix daily she will need to take 80 mg po for the next two days.  I do not see potassium but would give her 20 meq daily and check a BMET again after she has taken the increased Lasix.  Follow up with Dr. Gwenlyn Found.

## 2013-09-18 NOTE — Telephone Encounter (Signed)
Spoke with Helene Kelp, [facility] nurse. She states today was her first visit with patient (they see patient per PCP, Dr. Bea Graff). She reports that patient has 3+ pitting edema in her left leg (only leg, as she is an amputee). She reports she is short of breath and is on 2L home O2 with 96% sats, BP 104/70. She reports patient takes 40mg  lasix daily. Patient also had neck biopsy done yesterday (had some labs that are in epic as well).   Spoke with patient. She reports that over the past 4 days the swelling has gotten worse and today is about 3x as big as yesterday. She reports over the last few weeks she has had some "puffiness" but this is normal for her. She reports extreme SOB, stating she cannot get out more than a few words. She also reports more information regarding her neck biopsy, stating that Dr. Melvyn Novas ordered this for her regarding SOB and was told she had a tumor on/in her neck that was bothersome, swollen and that needed further eval. Patient verified that she does take 40mg  lasix daily.   Patient was referred to Dr. Brunetta Jeans at [place] for PV eval and had a procedure in April at which time she reports only 1 artery was able to be opened up.   Patient is very concerned over the SOB and edema, stating she does not want to lose her only leg.

## 2013-09-18 NOTE — Telephone Encounter (Signed)
[name] called in stating that pt has an edema in her Lft leg and is having sob more often.She would like to speak with the nurse in more detail about pt's condition. Please call  Thanks

## 2013-09-19 ENCOUNTER — Telehealth: Payer: Self-pay | Admitting: Cardiovascular Disease

## 2013-09-19 ENCOUNTER — Telehealth: Payer: Self-pay

## 2013-09-19 ENCOUNTER — Other Ambulatory Visit: Payer: Self-pay | Admitting: Internal Medicine

## 2013-09-19 DIAGNOSIS — R918 Other nonspecific abnormal finding of lung field: Secondary | ICD-10-CM

## 2013-09-19 MED ORDER — FUROSEMIDE 40 MG PO TABS: ORAL_TABLET | ORAL | Status: DC

## 2013-09-19 MED ORDER — POTASSIUM CHLORIDE ER 20 MEQ PO TBCR: 20.0000 meq | EXTENDED_RELEASE_TABLET | Freq: Every day | ORAL | Status: DC

## 2013-09-19 NOTE — Telephone Encounter (Signed)
Spoke with patient. Informed her of med changes for TODAY and TOMORROW (80mg  lasix and 60mEq of K+)  She will also needs a BMET on Friday or Monday and her [facility] nurse will be notified of this as well.   Patient states her leg is still swollen and she may go see Dr. Alvan Dame today to make sure her edema is not ortho related

## 2013-09-19 NOTE — Telephone Encounter (Signed)
Spoke with Otila Kluver at [facility]-- nothing further needed. Dr Melvyn Novas called back and spoke with Radiology personally. Pt's procedure was completed on 09/17/13  Message to be closed

## 2013-09-19 NOTE — Telephone Encounter (Signed)
Left message with Helene Kelp with med instructions and labs requested (BMET) on Friday or Monday at latest. Provided fax number as necessary for orders/results

## 2013-09-19 NOTE — Telephone Encounter (Signed)
[place] #

## 2013-09-19 NOTE — Telephone Encounter (Signed)
Patient understands to take Kdur 20 meq daily for next 2 days when she takes Lasix 80 mg daily for 2 days.

## 2013-09-19 NOTE — Telephone Encounter (Signed)
Returned call to patient she stated she is out of Lasix and will need refill.Stated she has not took any Lasix in 3 days,since discharged from rehab. Advised she is to take 80 mg daily for 2 days only..[place] will come Friday 09/21/13 or Monday 09/17/13 to draw blood for a bmet.Advised to keep appointment with PCP Dr.Grisso Wednesday 09/26/13.

## 2013-09-19 NOTE — Telephone Encounter (Signed)
LM with [place] to have patient's regular home health nurse call back or get her contact number

## 2013-09-19 NOTE — Telephone Encounter (Signed)
New Prob    Pt is requesting a new prescription of Lasix. States she also has some questions regarding this medications. Please call.

## 2013-09-19 NOTE — Telephone Encounter (Signed)
Spoke with [place] - patient's regular nurse not available. Called [facility]

## 2013-09-19 NOTE — Telephone Encounter (Signed)
LM for [name] to return call

## 2013-09-20 ENCOUNTER — Telehealth: Payer: Self-pay | Admitting: Internal Medicine

## 2013-09-20 NOTE — Telephone Encounter (Signed)
Per referral placed 09/19/13: General 09/20/2013 9:26 AM MCRAE, TIFFANY N        Note    S/W PATIENT AND GAVE NP APPT FOR 08/18 @ 1:30 W/DR. [place]  --  Called pt. NA, VM not set up yet. WCB

## 2013-09-20 NOTE — Telephone Encounter (Signed)
S/W PATIENT AND GAVE NP APPT FOR 08/18 @ 1:30 W/DR. [place] DX-LUNG CA

## 2013-09-21 ENCOUNTER — Telehealth: Payer: Self-pay | Admitting: Cardiovascular Disease

## 2013-09-21 ENCOUNTER — Encounter: Payer: Self-pay | Admitting: Internal Medicine

## 2013-09-21 ENCOUNTER — Ambulatory Visit (INDEPENDENT_AMBULATORY_CARE_PROVIDER_SITE_OTHER): Payer: Medicare Other | Admitting: Internal Medicine

## 2013-09-21 VITALS — BP 94/58 | HR 80 | Temp 98.0°F

## 2013-09-21 DIAGNOSIS — L03116 Cellulitis of left lower limb: Secondary | ICD-10-CM

## 2013-09-21 DIAGNOSIS — L03119 Cellulitis of unspecified part of limb: Secondary | ICD-10-CM

## 2013-09-21 DIAGNOSIS — J9610 Chronic respiratory failure, unspecified whether with hypoxia or hypercapnia: Secondary | ICD-10-CM

## 2013-09-21 DIAGNOSIS — R0902 Hypoxemia: Secondary | ICD-10-CM

## 2013-09-21 DIAGNOSIS — R918 Other nonspecific abnormal finding of lung field: Secondary | ICD-10-CM

## 2013-09-21 DIAGNOSIS — J9611 Chronic respiratory failure with hypoxia: Secondary | ICD-10-CM

## 2013-09-21 DIAGNOSIS — R222 Localized swelling, mass and lump, trunk: Secondary | ICD-10-CM

## 2013-09-21 DIAGNOSIS — L02419 Cutaneous abscess of limb, unspecified: Secondary | ICD-10-CM

## 2013-09-21 MED ORDER — OXYCODONE-ACETAMINOPHEN 10-325 MG PO TABS: 1.0000 | ORAL_TABLET | ORAL | Status: AC | PRN

## 2013-09-21 MED ORDER — PREDNISONE 10 MG PO TABS: ORAL_TABLET | ORAL | Status: DC

## 2013-09-21 MED ORDER — SPIRONOLACTONE 50 MG PO TABS: 50.0000 mg | ORAL_TABLET | Freq: Every day | ORAL | Status: DC

## 2013-09-21 NOTE — Telephone Encounter (Signed)
Spoke with the pt. She is aware of appt with oncology. She states she has been having increased SOB, appt set for today at 3:45. [place] Bing, CMA

## 2013-09-21 NOTE — Patient Instructions (Addendum)
Add aldactone 50 mg daily to lasix 40 x 2 today and each am and stop potassium  Prednisone 10 mg take  4 each am x 2 days,   2 each am x 2 days,  1 each am x 2 days and stop   Take pantoprazole 30 min before bfast and omeprazole before supper and Pepcid 20 mg at bedtime automatically   GERD (REFLUX)  is an extremely common cause of respiratory symptoms, many times with no significant heartburn at all.    It can be treated with medication, but also with lifestyle changes including avoidance of late meals, excessive alcohol, smoking cessation, and avoid fatty foods, chocolate, peppermint, colas, red wine, and acidic juices such as orange juice.  NO MINT OR MENTHOL PRODUCTS SO NO COUGH DROPS  USE SUGARLESS CANDY INSTEAD (jolley ranchers or Stover's)  NO OIL BASED VITAMINS - use powdered substitutes.    For pain take perocet 10 up to 2 every 4 hours as needed   If condition gets worse on this go to [facility] ER

## 2013-09-21 NOTE — Assessment & Plan Note (Signed)
Adequate control on present rx, reviewed > no change in rx needed   

## 2013-09-21 NOTE — Telephone Encounter (Signed)
Closed encounter °

## 2013-09-21 NOTE — Progress Notes (Signed)
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 Pulmonary Consult    Referred per Dr. Sherolyn Buba. Pt c/o SOB on and off "for a long time"- worse for the past 2 month.  She states that she is SOB " all the time"- even with talking. She also c/o cough for the past wk- prod with green to brown sputum. She has had sore throat since Jan 2015 and states it has been getting progressively worse.   main complaint is sore throat and hoarseness with min dark sputum and dysphagia but still able to swallow s choking on food - breathing worse after advair, better p neb  rec Pet 09/11/13 c/w Stage IV lung ca with L hilar mass and LL obst  09/17/2013 ULTRASOUND GUIDED CORE BIOPSY OF LEFT NECK CERVICAL LYMPH NODE> necrosis with [name] c/w Stage IV lung ca > [facility] referral 09/18/13     09/21/2013  ov/[name] re: sob and L leg swelling already on lasix/ pradaxa / doxy per ortho Chief Complaint  Patient presents with  . Acute Visit    Pt c/o increased SOB and pain in her neck x 4 days.   taking no more than one percocet 7.5 a few times a day for L leg pain with marginal control but when takes two at hs does fine.  No cp, sats fine on 2lpm. Some cough/congestion not on inhalers.  No obvious day to day or daytime variabilty or assoc   cp or chest tightness, subjective wheeze overt sinus or hb symptoms. No unusual exp hx or h/o childhood pna/ asthma or  knowledge of premature birth.  Sleeping ok without nocturnal  or early am exacerbation  of respiratory  c/o's or need for noct saba. Also denies any obvious fluctuation of symptoms with weather or environmental changes or other aggravating or alleviating factors except as outlined above   Current Medications, Allergies, Complete Past Medical History, Past Surgical History, Family History, and Social History were reviewed in [employer] record.  ROS  The following are not active complaints unless bolded sore throat, dysphagia, dental problems, itching, sneezing,  nasal congestion or excess/ purulent secretions, ear ache,   fever, chills, sweats, unintended wt loss, pleuritic or exertional cp, hemoptysis,  orthopnea pnd or leg swelling, presyncope, palpitations, heartburn, abdominal pain, anorexia, nausea, vomiting, diarrhea  or change in bowel or urinary habits, change in stools or urine, dysuria,hematuria,  rash, arthralgias, visual complaints, headache, numbness weakness or ataxia or problems with walking or coordination,  change in mood/affect or memory.             Objective:   Physical Exam  edentulous wf extremely hoarse but talking in full sentences, no stridor or swelling at bx side L neck   09/21/2013        Wt Readings from Last 3 Encounters:  07/10/13 165 lb (  74.844 kg)  07/06/13 177 lb 11.1 oz (80.6 kg)  05/16/13 165 lb (74.844 kg)       HEENT mild turbinate edema.  Oropharynx no thrush or excess pnd or cobblestoning.  No JVD or cervical adenopathy. Mild accessory muscle hypertrophy. Trachea midline, nl thryroid. Chest was hyperinflated by percussion with diminished breath sounds and moderate increased exp time without wheeze. Hoover sign positive at mid inspiration. Regular rate and rhythm without murmur gallop or rub or increase P2   Abd: no hsm, nl excursion. Ext warm without cyanosis or clubbing.   R AKA - L leg diffuse swelling below L knee, 2 plus  pitting, severe venous stasis changes with sev ulceration over toes all less than dime size         Assessment & Plan:

## 2013-09-21 NOTE — Assessment & Plan Note (Signed)
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

## 2013-09-21 NOTE — Assessment & Plan Note (Signed)
rx doxy already ongoing - add aldactone to lasix and d/c kcl to relieve swelling/ pain from swelling and continue pridaxa   Change percocet to 10 mg of oxycodone and take up to 2 every 4 h prn   If condition worsen > to ER

## 2013-09-24 ENCOUNTER — Other Ambulatory Visit: Payer: Self-pay | Admitting: *Deleted

## 2013-09-24 DIAGNOSIS — R918 Other nonspecific abnormal finding of lung field: Secondary | ICD-10-CM

## 2013-09-25 ENCOUNTER — Ambulatory Visit (HOSPITAL_BASED_OUTPATIENT_CLINIC_OR_DEPARTMENT_OTHER): Payer: Medicare Other | Admitting: Internal Medicine

## 2013-09-25 ENCOUNTER — Other Ambulatory Visit (HOSPITAL_BASED_OUTPATIENT_CLINIC_OR_DEPARTMENT_OTHER): Payer: Medicare Other

## 2013-09-25 ENCOUNTER — Ambulatory Visit: Payer: Medicare Other

## 2013-09-25 ENCOUNTER — Encounter: Payer: Self-pay | Admitting: Internal Medicine

## 2013-09-25 VITALS — BP 104/50 | HR 64 | Temp 97.6°F | Resp 18 | Wt 155.8 lb

## 2013-09-25 DIAGNOSIS — C801 Malignant (primary) neoplasm, unspecified: Secondary | ICD-10-CM

## 2013-09-25 DIAGNOSIS — R918 Other nonspecific abnormal finding of lung field: Secondary | ICD-10-CM

## 2013-09-25 DIAGNOSIS — R0602 Shortness of breath: Secondary | ICD-10-CM

## 2013-09-25 DIAGNOSIS — R05 Cough: Secondary | ICD-10-CM

## 2013-09-25 DIAGNOSIS — J029 Acute pharyngitis, unspecified: Secondary | ICD-10-CM

## 2013-09-25 DIAGNOSIS — C3402 Malignant neoplasm of left main bronchus: Secondary | ICD-10-CM | POA: Insufficient documentation

## 2013-09-25 DIAGNOSIS — R490 Dysphonia: Secondary | ICD-10-CM

## 2013-09-25 DIAGNOSIS — R059 Cough, unspecified: Secondary | ICD-10-CM

## 2013-09-25 DIAGNOSIS — R1310 Dysphagia, unspecified: Secondary | ICD-10-CM

## 2013-09-25 LAB — CBC WITH DIFFERENTIAL/PLATELET
BASO%: 0.8 % (ref 0.0–2.0)
BASOS ABS: 0.1 10*3/uL (ref 0.0–0.1)
EOS%: 1.5 % (ref 0.0–7.0)
Eosinophils Absolute: 0.1 10*3/uL (ref 0.0–0.5)
HCT: 31.2 % — ABNORMAL LOW (ref 34.8–46.6)
HEMOGLOBIN: 10 g/dL — AB (ref 11.6–15.9)
LYMPH%: 22.2 % (ref 14.0–49.7)
MCH: 34.2 pg — AB (ref 25.1–34.0)
MCHC: 32.2 g/dL (ref 31.5–36.0)
MCV: 106 fL — AB (ref 79.5–101.0)
MONO#: 0.9 10*3/uL (ref 0.1–0.9)
MONO%: 13.5 % (ref 0.0–14.0)
NEUT#: 4.3 10*3/uL (ref 1.5–6.5)
NEUT%: 62 % (ref 38.4–76.8)
Platelets: 290 10*3/uL (ref 145–400)
RBC: 2.94 10*6/uL — AB (ref 3.70–5.45)
RDW: 14.2 % (ref 11.2–14.5)
WBC: 6.9 10*3/uL (ref 3.9–10.3)
lymph#: 1.5 10*3/uL (ref 0.9–3.3)

## 2013-09-25 LAB — COMPREHENSIVE METABOLIC PANEL (CC13)
ALBUMIN: 3 g/dL — AB (ref 3.5–5.0)
ALK PHOS: 48 U/L (ref 40–150)
ALT: 7 U/L (ref 0–55)
AST: 13 U/L (ref 5–34)
Anion Gap: 9 mEq/L (ref 3–11)
BUN: 14.7 mg/dL (ref 7.0–26.0)
CO2: 30 mEq/L — ABNORMAL HIGH (ref 22–29)
Calcium: 8.9 mg/dL (ref 8.4–10.4)
Chloride: 102 mEq/L (ref 98–109)
Creatinine: 0.7 mg/dL (ref 0.6–1.1)
Glucose: 87 mg/dl (ref 70–140)
POTASSIUM: 3 meq/L — AB (ref 3.5–5.1)
Sodium: 140 mEq/L (ref 136–145)
Total Bilirubin: 0.42 mg/dL (ref 0.20–1.20)
Total Protein: 5.6 g/dL — ABNORMAL LOW (ref 6.4–8.3)

## 2013-09-25 NOTE — Progress Notes (Signed)
[place] [phone]   [phone]  CONSULT NOTE  REFERRING PHYSICIAN: Dr. Christinia Gully  REASON FOR CONSULTATION:  68 years old white female with questionable lung cancer  HPI [name] is a 68 y.o. female was past medical history significant for multiple medical problems including history of congestive heart failure, atrial fibrillation, COPD, peripheral vascular disease, right AKA AS WELL as long history of smoking. The patient mentions that since January of 2015 she has been complaining of sore throat as well as hoarseness of her voice and dysphagia. She was seen by several physicians including ENT, Dr. Owens Shark at the [facility] [place] [facility] in [place] and she was told that there was no concerning findings. In June of 2015 the patient continues to have the sore throat in addition to pain at the base of her tongue and shortness of breath. She was admitted to [facility] in [place]. CT scan of the neck and chest were performed on 08/04/2013 and it showed no explanation for the hoarseness within the neck. There was bilateral thyroid nodules which are indeterminate. There was also no explanation for the hoarseness within the chest. Repeat CT scan of the chest, abdomen and pelvis performed on 08/18/2013 showed a 2.2 x 1.5 CM left hilar mass. This abuts the left pulmonary artery. This may have an endobronchial component. Repeat CT scan of the chest again on 09/02/2013 showed a focal area of consolidation in the medial aspect of the posterior segment of the right upper lobe measuring 1.5 x 1.3 cm common not appreciable 1 month prior.There is adenopathy in the left hilar region. The largest individual lymph node in the left hilum is 2.9 by 1.8 cm in size. Along the superior left hilum, there is a second lymph node measuring 1.7 x 1.7 cm. There are several smaller mediastinal lymph nodes which do not meet size criteria for pathologic  significance.  a PET scan was performed on 09/08/2013 and it showed a left-sided level-II lymph node which measures 1.1 cm and has an SUV max equal to 4.3. There is intense FDG uptake identified within the base of tongue. This has an SUV max equal to 7.8. Hypermetabolic left hilar lymph node has an SUV max equal to 7.4. No ipsilateral mediastinal hypermetabolic adenopathy. No contralateral mediastinal or hilar adenopathy identified. Within the right upper lobe there is a nodule which measures 1 cm. This exhibits malignant range FDG uptake within SUV max equal to 1.6. The patient was referred to Dr. Melvyn Novas and he ordered ultrasound guided core biopsy of the left cervical lymph node by interventional radiology which was performed on 09/17/2013. The final pathology (Accession: [id number]) showed non-small cell carcinoma. Immunohistochemistry is performed and the tumor show strong positivity with cytokeratin 5/6,cytokeratin 903, p63 and cytokeratin 7. The tumor is negative with CDX-2, thyroid transcription factor-1, WT-1, Napsin-A, estrogen receptor, progesterone receptor and gross cystic disease fluid protein. The immunophenotypic features and morphology are most consistent with squamous cell carcinoma. Dr. Melvyn Novas kindly referred the patient to me today for further evaluation and recommendation regarding treatment of her condition. When seen today she continues to complain of the soreness at the base of her tongue with tightness and difficulty swallowing solid food and she also had aspiration to thin liquid. She has shortness of breath at baseline and currently on home oxygen. She denied having any chest pain but has dry cough with no hemoptysis. The patient also has few pounds of weight loss recently. She  denied having any significant headache or visual changes. Her family history significant for a mother who died at age 82 with congestive heart failure and father died from complications of meningitis. The  patient is a widow and has no children. She lives in [place], [place] She was accompanied today by her sister Manuela Schwartz and her friend Malachy Mood.She used to work as Investment banker, operational. She has a history of smoking one pack per week for around 33 years and quit 13 months ago. She drinks alcohol occasionally and no history of drug abuse.   HPI  Past Medical History  Diagnosis Date  . Asthma   . GERD (gastroesophageal reflux disease)   . COPD (chronic obstructive pulmonary disease) with emphysema   . History of TIA (transient ischemic attack)     PER MRI 2009-- NO RESIDUAL  . Osteoarthritis   . Rheumatoid arthritis(714.0)   . DJD (degenerative joint disease)   . B12 deficiency anemia   . History of DVT of lower extremity     RIGHT LOWER LEG THEN MOVED TO GOIN S/P REMOVAL  . S/P AKA (above knee amputation) unilateral     RIGHT 12-17-2010  . Ulcer of left lower leg   . PAF (paroxysmal atrial fibrillation)   . S/P ablation of atrial fibrillation   . First degree heart block   . History of CHF (congestive heart failure)   . PONV (postoperative nausea and vomiting)   . Chronic venous insufficiency   . Venous insufficiency   . Peripheral arterial disease     nonhealing left foot ulcer    Past Surgical History  Procedure Laterality Date  . Cardiac electrophysiology mapping and ablation  APRIL 2009    A-FIB ABLATION AND TEE ( SLIGHTLY DILATED ATRIA, NORMAL LV FUNCTION AND VALVES)  . Hematoma evacuation  2009    From right groin  . Trigger finger release  2011  . Amputation  12/17/2010    Procedure: AMPUTATION ABOVE KNEE;  Surgeon: Mauri Pole;  Location: [facility];  Service: Orthopedics;  Laterality: Right;  . Application of wound vac  12/17/2010    Procedure: APPLICATION OF WOUND VAC;  Surgeon: Mauri Pole;  Location: [facility];  Service: Orthopedics;  Laterality: Right;  wound Vac applied @ 2045 By Salli Quarry  . Amputation  04/27/2011    Procedure: AMPUTATION ABOVE KNEE;  Surgeon: Mauri Pole, MD;  Location: [facility];  Service: Orthopedics;  Laterality: Right;  Revision Right above knee amputation.  . Cardioversion  10-22-2011   [facility]    x5 times  . Amputation  02/28/2012    Procedure: AMPUTATION BELOW KNEE;  Surgeon: Mauri Pole, MD;  Location: [facility];  Service: Orthopedics;  Laterality: Right;  SCAR REVISION  . Tenosynovectomy  04-19-2012    left long and ring fingers   . Amputation Right 06/06/2012    Procedure: REVISION ABOVE RIGHT KNEE AMPUTATION, STUMP ;  Surgeon: Mauri Pole, MD;  Location: [facility];  Service: Orthopedics;  Laterality: Right;  . I&d extremity Right 06/27/2012    Procedure: IRRIGATION AND DEBRIDEMENT hematoma right AKA;  Surgeon: Mauri Pole, MD;  Location: [facility];  Service: Orthopedics;  Laterality: Right;  . Irrigation and debridement knee Right 09/12/2012    Procedure: REVISION RIGHT LEG WOUND AND PRIMARY CLOSURE ;  Surgeon: Mauri Pole, MD;  Location: [facility];  Service: Orthopedics;  Laterality: Right;  . Total knee arthroplasty Bilateral RIGHT 1996/  LEFT 1997  .  Revision total knee arthroplasty Right 02-04-2010  . Cysto/ bladder bx  02-26-2003  . Resection right total knee / i & d/  placement antibiotic spacer  04-23-2010    06-23-2010--REPEAT I & D REPLACEMENT ANTIBIOTIC SPACER/   AND MULTIPLE TEETH EXTRACTIONS WITH BILATERAL MANDIBULAR REDUCTION  . Removal antibiotic spacer/  reimplantation right total knee  09-07-2010  . Open repair quadriceps tendon w/ allograft Right 12-08-2010  . Cataract extraction w/ intraocular lens  implant, bilateral    . Cholecystectomy  1996  . Transthoracic echocardiogram  12-16-2010    LVEF  60-65%/  MILD DILATED LA  &  RA/  MODERATELY DILATED RV  . Vascular surgery      REPAIR CFA  . Cardiac catheterization  2006  DR ZAN TYSON    NO CAD (PER CARDIOLOGIST NOTE , DR TYSON)  . I&d extremity Left 02/26/2013    Procedure: IRRIGATION AND DEBRIDEMENT OF LEFT LEG WITH PLACEMENT OF ACELL;  Surgeon:  [name], DO;  Location: [facility];  Service: Plastics;  Laterality: Left;    Family History  Problem Relation Age of Onset  . Achondroplasia Father   . Congenital heart disease Mother   . Hypertension Mother   . Hypertension Sister     Social History History  Substance Use Topics  . Smoking status: Former Smoker -- 1.50 packs/day for 31 years    Quit date: 02/21/2010  . Smokeless tobacco: Never Used  . Alcohol Use: No    Allergies  Allergen Reactions  . Sulfa Antibiotics Anaphylaxis and Shortness Of Breath  . Adhesive [Tape] Other (See Comments)    Satiny and adhesive tape - blisters skin  . Avelox [Moxifloxacin Hcl In Nacl] Diarrhea and Nausea And Vomiting  . Penicillins Hives and Rash    Current Outpatient Prescriptions  Medication Sig Dispense Refill  . amiodarone (PACERONE) 200 MG tablet Take 200 mg by mouth daily with supper.       . dabigatran (PRADAXA) 150 MG CAPS Take 150 mg by mouth 2 (two) times daily.       Marland Kitchen dimenhyDRINATE (DRAMAMINE) 50 MG tablet Take 50 mg by mouth every 8 (eight) hours as needed.      . doxycycline (VIBRAMYCIN) 100 MG capsule Take 100 mg by mouth 2 (two) times daily.      . furosemide (LASIX) 40 MG tablet Take 2 tablets ( 80 mg ) daily for 2 days  8 tablet  0  . hydroxypropyl methylcellulose (ISOPTO TEARS) 2.5 % ophthalmic solution Place 1 drop into both eyes daily as needed (For dry eyes or allergies.). As needed      . MYRBETRIQ 50 MG TB24 tablet Take 50 mg by mouth daily with lunch.       Marland Kitchen omeprazole (PRILOSEC) 40 MG capsule Take 40 mg by mouth daily.      Marland Kitchen oxyCODONE-acetaminophen (PERCOCET) 10-325 MG per tablet Take 1 tablet by mouth every 4 (four) hours as needed for pain.  30 tablet  0  . pantoprazole (PROTONIX) 40 MG tablet Take 1 tablet (40 mg total) by mouth daily before breakfast.  30 tablet  2  . predniSONE (DELTASONE) 10 MG tablet Take  4 each am x 2 days,   2 each am x 2 days,  1 each am x 2 days and stop   14 tablet  0  . spironolactone (ALDACTONE) 50 MG tablet Take 1 tablet (50 mg total) by mouth daily.  30 tablet  2   No  current facility-administered medications for this visit.    Review of Systems  Constitutional: positive for anorexia, fatigue and weight loss Eyes: negative Ears, nose, mouth, throat, and face: positive for hoarseness and sore throat Respiratory: positive for cough and dyspnea on exertion Cardiovascular: negative Gastrointestinal: negative Genitourinary:negative Integument/breast: negative Hematologic/lymphatic: negative Musculoskeletal:positive for muscle weakness Neurological: negative Behavioral/Psych: negative Endocrine: negative Allergic/Immunologic: negative  Physical Exam  TOI:ZTIWP, healthy, no distress, well nourished and well developed SKIN: skin color, texture, turgor are normal, no rashes or significant lesions HEAD: Normocephalic, No masses, lesions, tenderness or abnormalities EYES: normal, PERRLA EARS: External ears normal, Canals clear OROPHARYNX:no exudate, no erythema and lips, buccal mucosa, and tongue normal  NECK: supple, palpable left cervical lymph node. LYMPH:  palpable left cervical lymph node. BREAST:not examined LUNGS: clear to auscultation , and palpation HEART: regular rate & rhythm, no murmurs and no gallops ABDOMEN:abdomen soft, non-tender, obese, normal bowel sounds and no masses or organomegaly BACK: Back symmetric, no curvature., No CVA tenderness EXTREMITIES: Right AKA.  NEURO: alert & oriented x 3 with fluent speech, no focal motor/sensory deficits  PERFORMANCE STATUS: ECOG 2  LABORATORY DATA: Lab Results  Component Value Date   WBC 6.9 09/25/2013   HGB 10.0* 09/25/2013   HCT 31.2* 09/25/2013   MCV 106.0* 09/25/2013   PLT 290 09/25/2013      Chemistry      Component Value Date/Time   NA 140 09/25/2013 1341   NA 140 09/17/2013 1045   K 3.0* 09/25/2013 1341   K 3.6* 09/17/2013 1045   CL 103 09/17/2013 1045   CO2  30* 09/25/2013 1341   CO2 25 09/17/2013 1045   BUN 14.7 09/25/2013 1341   BUN 8 09/17/2013 1045   CREATININE 0.7 09/25/2013 1341   CREATININE 0.59 09/17/2013 1045      Component Value Date/Time   CALCIUM 8.9 09/25/2013 1341   CALCIUM 9.1 09/17/2013 1045   ALKPHOS 48 09/25/2013 1341   ALKPHOS 69 07/05/2013 0424   AST 13 09/25/2013 1341   AST 15 07/05/2013 0424   ALT 7 09/25/2013 1341   ALT 12 07/05/2013 0424   BILITOT 0.42 09/25/2013 1341   BILITOT 0.2* 07/05/2013 0424       RADIOGRAPHIC STUDIES: US Biopsy  09/17/2013   CLINICAL DATA:  Hypermetabolic lymphadenopathy in the left cervical region and left hilum by PET scan. Additional increased metabolic activity at the base of the tongue parenchymal lung abnormality in the posterior left lower lobe. The patient presents for biopsy of the cervical lymph node.  EXAM: ULTRASOUND GUIDED CORE BIOPSY OF LEFT NECK CERVICAL LYMPH NODE  MEDICATIONS: 1.0 mg IV Versed; 50 mcg IV Fentanyl  Total Moderate Sedation Time: 15 min  PROCEDURE: The procedure, risks, benefits, and alternatives were explained to the patient. Questions regarding the procedure were encouraged and answered. The patient understands and consents to the procedure.  The left neck was prepped with Betadine in a sterile fashion, and a sterile drape was applied covering the operative field. A sterile gown and sterile gloves were used for the procedure. Local anesthesia was provided with 1% Lidocaine.  Left cervical lymph node was localized. Under direct ultrasound guidance, a total of 5 separate free hand 18 gauge core biopsies were obtained of the node.  COMPLICATIONS: None.  FINDINGS: Ultrasound shows a left cervical lymph node measuring approximately 1.1 x 1.4 cm and corresponding in size and location to the abnormal lymph node detected by PET scan. Solid tissue was obtained.  IMPRESSION: Ultrasound-guided  core biopsy performed of enlarged left cervical lymph node.   Electronically Signed   By: Aletta Edouard M.D.   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   PLAN: I had a lengthy discussion with the patient and her family today about her current disease status and treatment options. I will complete the staging workup by ordering MRI of the brain to rule out brain metastases. I will also refer the patient to see ENT physician in [facility] for further evaluation and visual exam of the base of the tongue and biopsy if needed. 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  I discussed with the patient her treatment options including palliative care and hospice referral versus systemic chemotherapy with carboplatin and paclitaxel. I discussed with the patient adverse effect of the chemotherapy including but not limited to alopecia, myelosuppression, nausea and vomiting, peripheral neuropathy, liver or renal dysfunction. The patient would consider the treatment but she would like to take it close to home in [place].  I will refer the patient to see either Dr. Bobby Rumpf or Dr. Anabel Bene at [facility] in [place]. I gave the patient my contact information and asked her to call me immediately if she has any concerning symptoms in the interval. The patient voices understanding of current disease status and treatment options and is in agreement with the current care plan.  All questions were answered. The patient knows to call the clinic with any problems, questions or concerns. We can certainly see the patient much sooner if necessary.  Thank you so much for allowing me to participate in the care of [name]. I will continue to follow  up the patient with you and assist in her care.  I spent 40 minutes counseling the patient face to face. The total time spent in the appointment was 60 minutes.  Disclaimer: This note was dictated with voice recognition software. Similar sounding words can inadvertently be transcribed and may not be corrected upon review.   Zeek Rostron K. 09/25/2013, 3:21 PM

## 2013-09-26 ENCOUNTER — Telehealth: Payer: Self-pay | Admitting: Internal Medicine

## 2013-09-26 ENCOUNTER — Other Ambulatory Visit: Payer: Self-pay | Admitting: *Deleted

## 2013-09-26 NOTE — Progress Notes (Signed)
Pt is requesting MRI of the brain to be done at [place].  Vivien Rota in central scheduling has forwarded MRI brain order to GI, she asked for the schedulers at [facility] to schedule appt with GI.  Urgent Onc tx schedule sent to schedulers.  SLJ

## 2013-09-27 ENCOUNTER — Telehealth: Payer: Self-pay | Admitting: Internal Medicine

## 2013-09-27 NOTE — Telephone Encounter (Signed)
Faxed pt medical records to [facility] @ [facility].  Pamala Hurry will call with appt.

## 2013-10-01 ENCOUNTER — Telehealth: Payer: Self-pay | Admitting: Internal Medicine

## 2013-10-01 NOTE — Telephone Encounter (Signed)
Pt appt. To see Dr. Bobby Rumpf @ [facility] is 10/05/13@3 :00. Pt is aware

## 2013-10-02 ENCOUNTER — Other Ambulatory Visit: Payer: Self-pay | Admitting: Otolaryngology

## 2013-10-03 ENCOUNTER — Ambulatory Visit (INDEPENDENT_AMBULATORY_CARE_PROVIDER_SITE_OTHER): Payer: Medicare Other | Admitting: Internal Medicine

## 2013-10-03 ENCOUNTER — Encounter: Payer: Self-pay | Admitting: Internal Medicine

## 2013-10-03 VITALS — BP 110/60 | HR 61 | Temp 97.8°F

## 2013-10-03 DIAGNOSIS — R222 Localized swelling, mass and lump, trunk: Secondary | ICD-10-CM

## 2013-10-03 DIAGNOSIS — L03119 Cellulitis of unspecified part of limb: Secondary | ICD-10-CM

## 2013-10-03 DIAGNOSIS — L03116 Cellulitis of left lower limb: Secondary | ICD-10-CM

## 2013-10-03 DIAGNOSIS — R918 Other nonspecific abnormal finding of lung field: Secondary | ICD-10-CM

## 2013-10-03 DIAGNOSIS — L02419 Cutaneous abscess of limb, unspecified: Secondary | ICD-10-CM

## 2013-10-03 MED ORDER — MORPHINE SULFATE ER 15 MG PO TBCR: 15.0000 mg | EXTENDED_RELEASE_TABLET | Freq: Two times a day (BID) | ORAL | Status: AC

## 2013-10-03 NOTE — Patient Instructions (Addendum)
Let dr Bea Graff manage all your fluid pills and potassium  Try  MS Contin 15 mg twice daily and let the wound clinic or Dr Bea Graff manage your pain needs in the future  For breakthrough pain take perocet 10 up to 2 every 4 hours as needed   Continue to wear the 02 24/7 at 2lpm   Please see patient coordinator before you leave today  to schedule wound clinic  Pulmonary follow up is as needed for breathing or coughing

## 2013-10-03 NOTE — Progress Notes (Signed)
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 origin  09/12/2013 1st [facility] office visit/ [name] / on advair  Chief Complaint  Patient presents with  . Pulmonary Consult    Referred per Dr. Sherolyn [name]. Pt c/o SOB on and off "for a long time"- worse for the past 2 month.  She states that she is SOB " all the time"- even with talking. She also c/o cough for the past wk- prod with green to brown sputum. She has had sore throat since Jan 2015 and states it has been getting progressively worse.   main complaint is sore throat and hoarseness with min dark sputum and dysphagia but still able to swallow s choking on food - breathing worse after advair, better p neb  rec Pet 09/11/13 c/w Stage IV lung ca with L hilar mass and LL obst  09/17/2013 ULTRASOUND GUIDED CORE BIOPSY OF LEFT NECK CERVICAL LYMPH NODE> necrosis with [place] c/w Stage IV lung ca > [facility] referral 09/18/13     09/21/2013  ov/[name] re: sob and L leg swelling already on lasix/ pradaxa / doxy per ortho Chief Complaint  Patient presents with  . Acute Visit    Pt c/o increased SOB and pain in her neck x 4 days.   taking no more than one percocet 7.5 a few times a day for L leg pain with marginal control but when takes two at hs does fine.  No cp, sats fine on 2lpm. Some cough/congestion not on inhalers. rec Add aldactone 50 mg daily to lasix 40 x 2 today and each am and stop potassium Prednisone 10 mg take  4 each am x 2 days,   2 each am x 2 days,   1 each am x 2 days and stop  Take pantoprazole 30 min before bfast and omeprazole before supper and Pepcid 20 mg at bedtime automatically  GERD diet  For pain take perocet 10 up to 2 every 4 hours as needed     10/03/2013 f/u ov/[name] re: met ca ? Origin/ L leg pain Chief Complaint  Patient presents with  . Follow-up    Pt states bx done yesterday. Her pain is unchanged since the last visit. She feels that her breathing is not doing as well today.   main issue is L leg pain Not using 02 consistently  Not using inhalers/ cough is dry sounding    No obvious day to day or daytime variabilty or assoc   cp or chest tightness, subjective wheeze overt sinus or hb symptoms. No unusual exp hx or h/o childhood pna/ asthma or knowledge of premature birth.  Sleeping ok without nocturnal  or early am exacerbation  of respiratory  c/o's or need for noct saba. Also denies any obvious fluctuation of symptoms with weather or environmental changes or other aggravating or alleviating factors except as outlined above   Current Medications, Allergies, Complete Past Medical History, Past Surgical  History, Family History, and Social History were reviewed in [employer] record.  ROS  The following are not active complaints unless bolded sore throat, dysphagia, dental problems, itching, sneezing,  nasal congestion or excess/ purulent secretions, ear ache,   fever, chills, sweats, unintended wt loss, pleuritic or exertional cp, hemoptysis,  orthopnea pnd or leg swelling, presyncope, palpitations, heartburn, abdominal pain, anorexia, nausea, vomiting, diarrhea  or change in bowel or urinary habits, change in stools or urine, dysuria,hematuria,  rash, arthralgias, visual complaints, headache, numbness weakness or ataxia or problems with walking or coordination,  change in mood/affect or memory.             Objective:   Physical Exam  edentulous wf extremely hoarse but talking in full  sentences, no stridor             Wt Readings from Last 3 Encounters:  07/10/13 165 lb (74.844 kg)  07/06/13 177 lb 11.1 oz (80.6 kg)  05/16/13 165 lb (74.844 kg)       HEENT mild turbinate edema.  Oropharynx no thrush or excess pnd or cobblestoning.  No JVD or cervical adenopathy. Mild accessory muscle hypertrophy. Trachea midline, nl thryroid. Chest was hyperinflated by percussion with diminished breath sounds and moderate increased exp time without wheeze. Hoover sign positive at mid inspiration. Regular rate and rhythm without murmur gallop or rub or increase P2   Abd: no hsm, nl excursion. Ext warm without cyanosis or clubbing.   R AKA - L leg diffuse swelling below L knee, 2 plus pitting, severe venous stasis changes with sev ulcerations over toes all less than dime size and also mid shin          Assessment & Plan:

## 2013-10-04 ENCOUNTER — Ambulatory Visit
Admission: RE | Admit: 2013-10-04 | Discharge: 2013-10-04 | Disposition: A | Discharge status: Home / Self Care | Payer: Medicare Other | Source: Ambulatory Visit | Attending: Internal Medicine | Admitting: Internal Medicine

## 2013-10-04 DIAGNOSIS — C3402 Malignant neoplasm of left main bronchus: Secondary | ICD-10-CM

## 2013-10-04 NOTE — Assessment & Plan Note (Signed)
Left pretibial ischemic ulcer> referred to wound clinic

## 2013-10-04 NOTE — Assessment & Plan Note (Signed)
-   Pet 09/11/13 c/w Stage IV lung ca with L hilar mass and LL obst  - 09/17/2013 ULTRASOUND GUIDED CORE BIOPSY OF LEFT NECK CERVICAL LYMPH NODE> necrosis with [place] c/w Stage IV lung ca > [facility] referral 09/18/13  I had an extended discussion with the patient today lasting 15 to 20 minutes of a 25 minute visit on the following issues:  Pt confused about w/u and where she goes from here > explained all rx directed by oncology at this point including review of tongue bx and determination of how many different tumor types are involved or whether this is all one tumor, but in meantime no reason she should be in pain   rec mscontin 15 mg bid and try to reduce the perocet and if can't go to 30 mg bid ms contin. Warned re constipation

## 2013-10-09 ENCOUNTER — Telehealth: Payer: Self-pay | Admitting: Medical Oncology

## 2013-10-09 NOTE — Telephone Encounter (Signed)
faxed note re tongue base bx

## 2013-10-10 ENCOUNTER — Ambulatory Visit: Payer: Self-pay | Admitting: Cardiology

## 2013-10-17 ENCOUNTER — Other Ambulatory Visit: Payer: Self-pay

## 2013-10-22 ENCOUNTER — Telehealth: Payer: Self-pay | Admitting: *Deleted

## 2013-10-22 NOTE — Telephone Encounter (Signed)
Pt called wanted to discuss issues she is having.  Pt has been transferred to [facility].  Advised that she call and speak with them.  She verbalized understanding.  SLJ

## 2013-10-24 ENCOUNTER — Telehealth: Payer: Self-pay | Admitting: *Deleted

## 2013-10-24 NOTE — Telephone Encounter (Signed)
Pt called and left a msg stating she needed to speak to Dr [facility] RN.  Attempted to call back, unable to leave a message.

## 2013-10-26 ENCOUNTER — Encounter (HOSPITAL_BASED_OUTPATIENT_CLINIC_OR_DEPARTMENT_OTHER): Payer: Medicare Other | Attending: General Surgery

## 2013-10-29 ENCOUNTER — Telehealth: Payer: Self-pay | Admitting: *Deleted

## 2013-10-29 NOTE — Telephone Encounter (Signed)
Pt called left a message that she has developed a fever and wants guidance on what she needs to do.  Called pt back, she states she is a patient at [facility].  Advised she call their clinic to let them know about her new symptoms.  She verbalized understanding and states "okay I will call them I was not sure who I needed to call".

## 2013-10-30 ENCOUNTER — Telehealth: Payer: Self-pay | Admitting: *Deleted

## 2013-10-30 NOTE — Telephone Encounter (Signed)
Patient called this office needing someone at the [facility]. I have called hr back, patient had that number

## 2013-12-09 DEATH — deceased

## 2014-09-27 IMAGING — CR DG TIBIA/FIBULA 2V*L*
4 series · 4 of 4 positions shown · non-contrast
Comparison: None.

CLINICAL DATA: Nonhealing wound of left mid anterior lower leg,
redness

EXAM:
LEFT TIBIA AND FIBULA - 2 VIEW

[x tib-fib ap right]
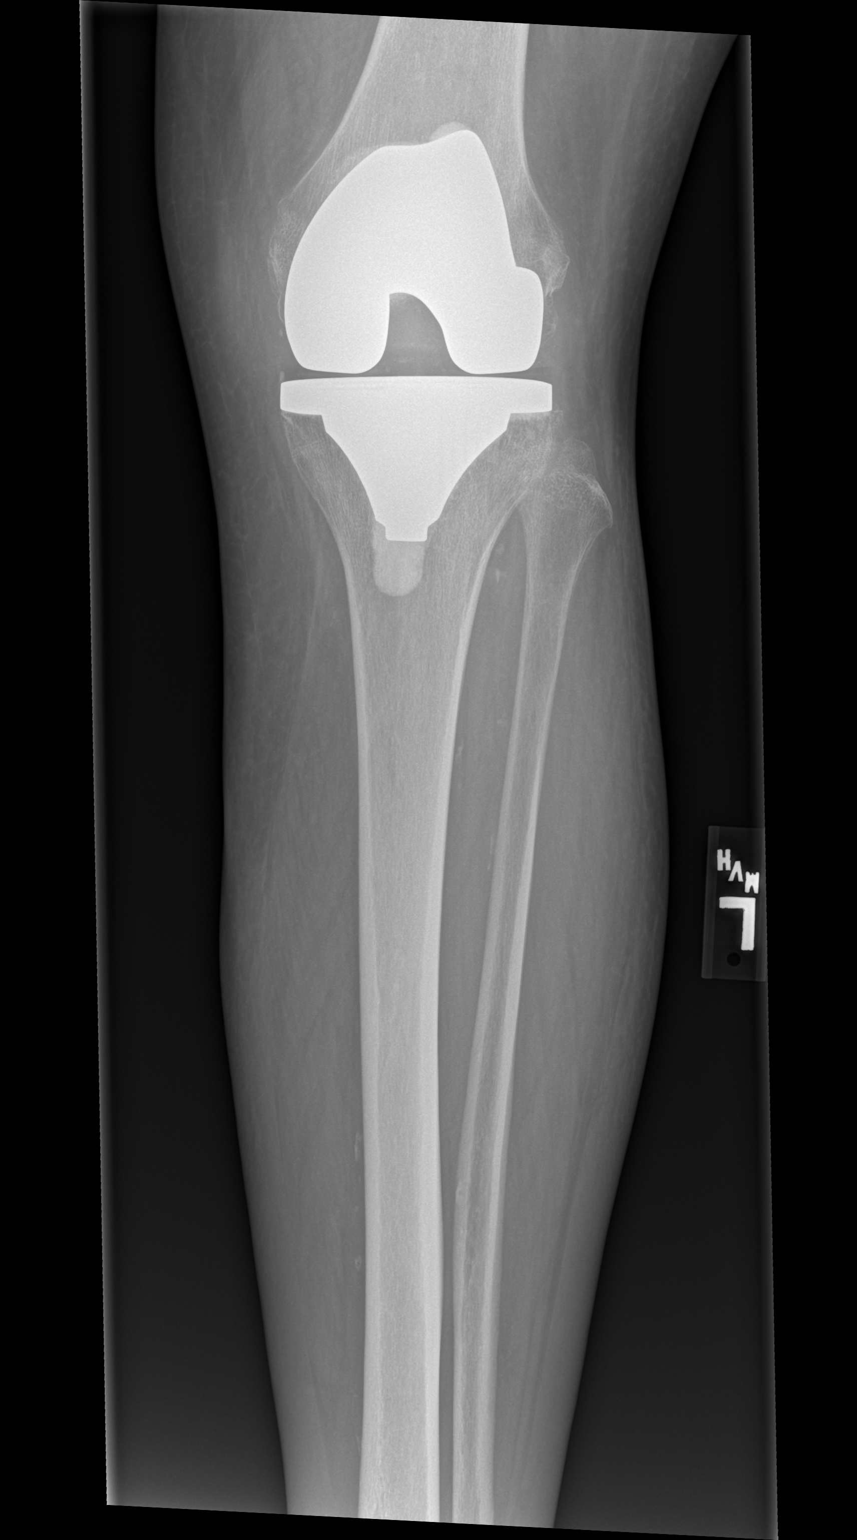

[x tib-fib lat right (1 of 3)]
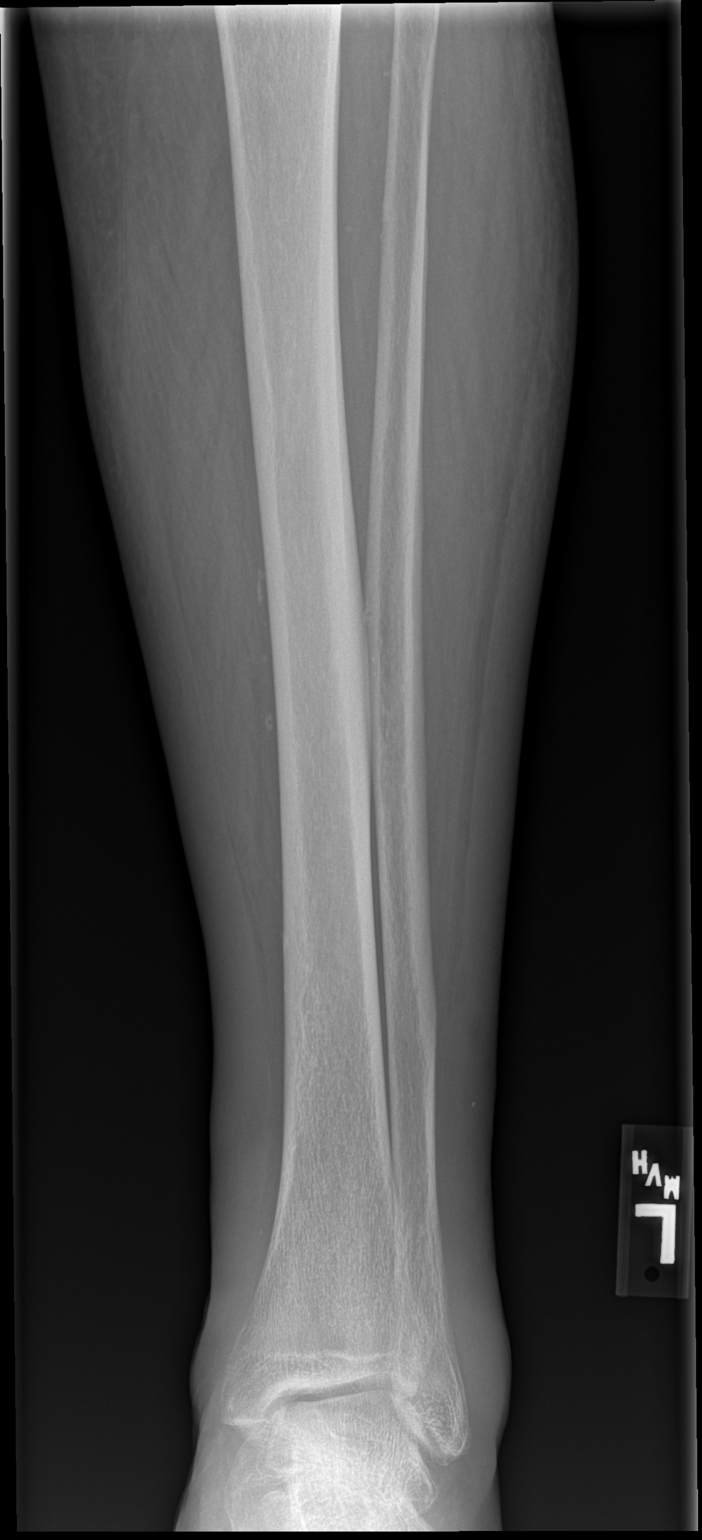

[x tib-fib lat right (2 of 3)]
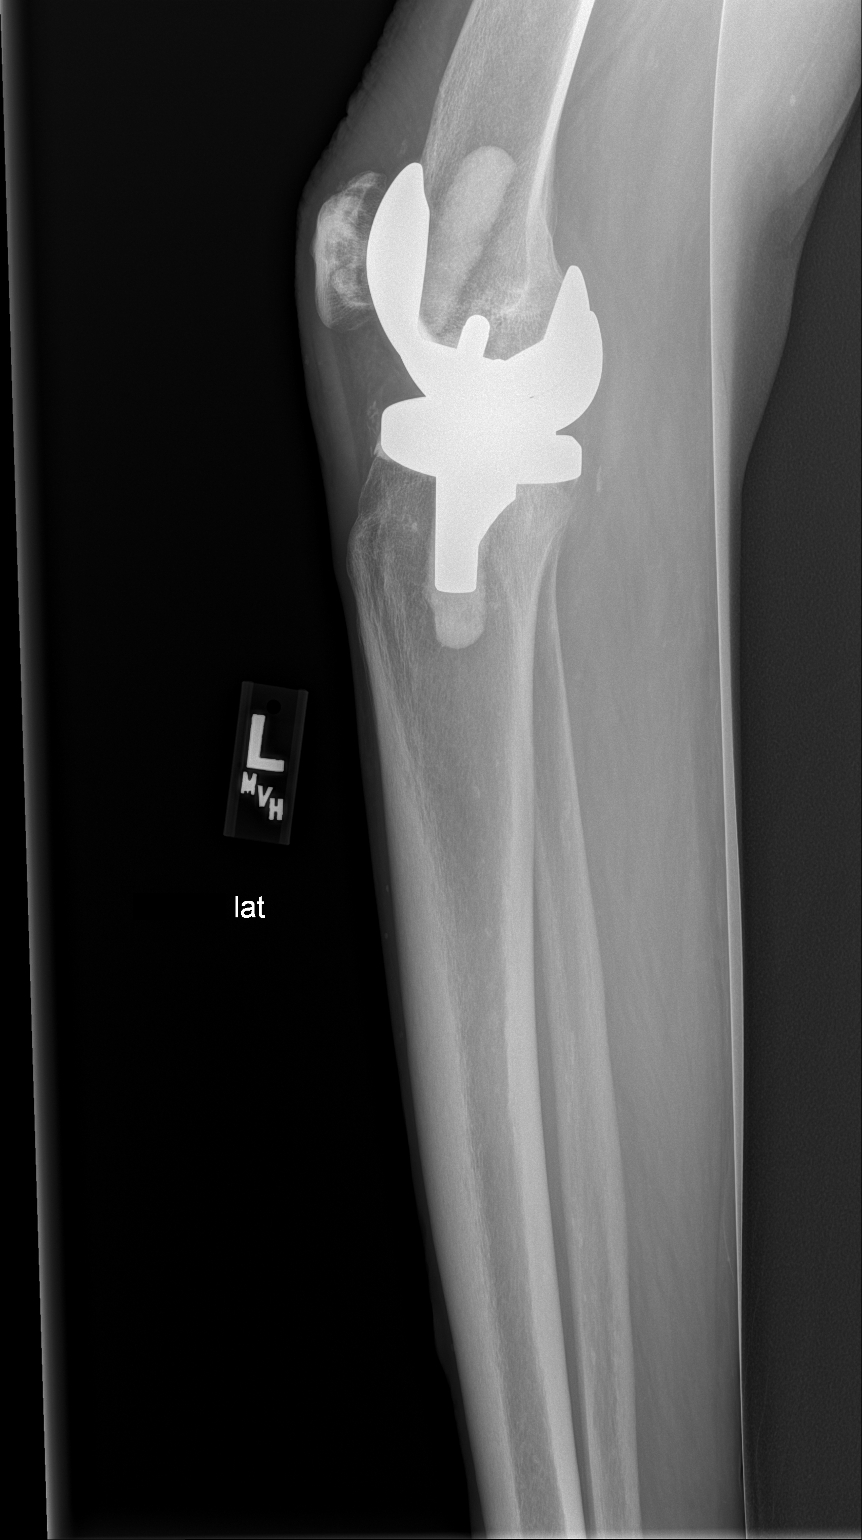

[x tib-fib lat right (3 of 3)]
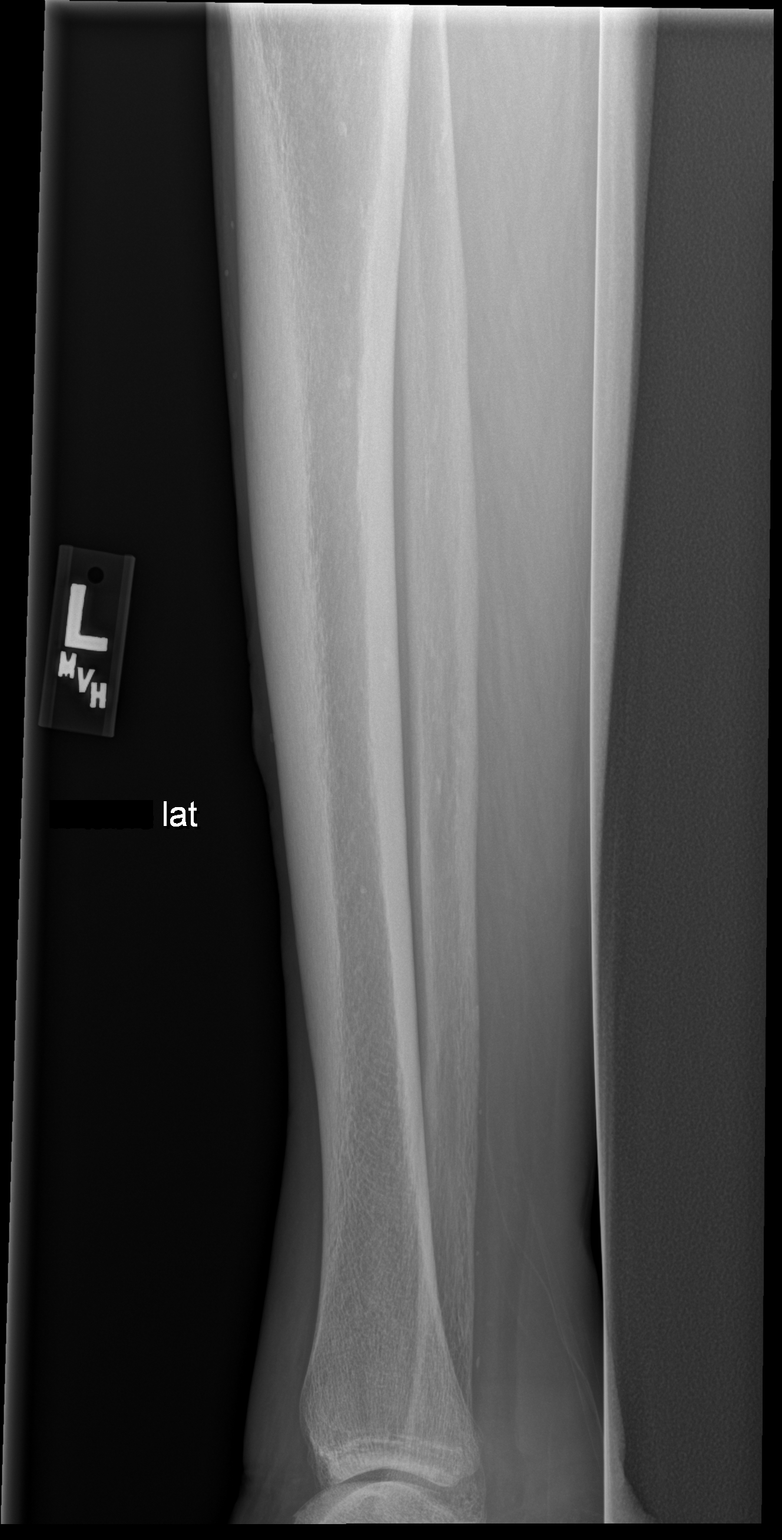

[4 of 4 positions shown; findings below may reference images not displayed]

FINDINGS: Previous left knee arthroplasty noted. Hardware appears intact.
Normal alignment. Left tibia and fibula appear intact. No fracture
evident. Left lower extremity and ankle soft tissue swelling
evident.
IMPRESSION: No acute osseous finding
